# Patient Record
Sex: Male | Born: 1980 | Hispanic: No | Marital: Single | Smoking: Former smoker
Health system: Southern US, Community
[De-identification: ages and names within clinical notes are randomized; demographics above are authoritative.]

## PROBLEM LIST (undated history)

## (undated) DIAGNOSIS — F988 Other specified behavioral and emotional disorders with onset usually occurring in childhood and adolescence: Secondary | ICD-10-CM

## (undated) DIAGNOSIS — L409 Psoriasis, unspecified: Secondary | ICD-10-CM

## (undated) DIAGNOSIS — L4050 Arthropathic psoriasis, unspecified: Secondary | ICD-10-CM

## (undated) DIAGNOSIS — S42309A Unspecified fracture of shaft of humerus, unspecified arm, initial encounter for closed fracture: Secondary | ICD-10-CM

## (undated) HISTORY — DX: Psoriasis, unspecified: L40.9

## (undated) HISTORY — PX: ANTERIOR CRUCIATE LIGAMENT REPAIR: SHX115

## (undated) HISTORY — DX: Other specified behavioral and emotional disorders with onset usually occurring in childhood and adolescence: F98.8

## (undated) HISTORY — DX: Unspecified fracture of shaft of humerus, unspecified arm, initial encounter for closed fracture: S42.309A

## (undated) HISTORY — DX: Arthropathic psoriasis, unspecified: L40.50

## (undated) HISTORY — PX: SHOULDER SURGERY: SHX246

---

## 2003-04-14 ENCOUNTER — Emergency Department (HOSPITAL_COMMUNITY): Admission: EM | Admit: 2003-04-14 | Discharge: 2003-04-14 | Payer: Self-pay | Admitting: Emergency Medicine

## 2003-04-29 ENCOUNTER — Ambulatory Visit (HOSPITAL_COMMUNITY): Admission: RE | Admit: 2003-04-29 | Discharge: 2003-04-30 | Payer: Self-pay | Admitting: Orthopedic Surgery

## 2010-03-18 ENCOUNTER — Emergency Department (HOSPITAL_COMMUNITY): Admission: EM | Admit: 2010-03-18 | Discharge: 2010-03-18 | Payer: Self-pay | Admitting: Emergency Medicine

## 2011-04-01 NOTE — Op Note (Signed)
NAME:  KIEFER, OPHEIM NO.:  [id number]   MEDICAL RECORD NO.:  [id number]                   PATIENT TYPE:  OIB   LOCATION:  5024                                 FACILITY:  [facility]   PHYSICIAN:  Burnard Bunting, M.D.                 DATE OF BIRTH:  1980/11/18   DATE OF PROCEDURE:  04/30/2003  DATE OF DISCHARGE:  04/30/2003                                 OPERATIVE REPORT   PREOPERATIVE DIAGNOSIS:  Right knee anterior cruciate ligament tear.   POSTOPERATIVE DIAGNOSIS:  Right knee anterior cruciate ligament tear.   PROCEDURE:  1. Right knee anterior cruciate ligament reconstruction.  2. Bone-patella-tendon-bone graft.   SURGEON:  Burnard Bunting, M.D.   ASSISTANT:  Jerolyn Shin. Tresa Res, M.D.   ANESTHESIA:  General endotracheal.   ESTIMATED BLOOD LOSS:  25 mL.   DRAINS:  None.   TOURNIQUET TIME:  One hour and 57 minutes at 300 mmHg.   FINDINGS:  1. On examination under anesthesia, the range of motion 0-130 degrees.  The     knee was stable to varus and valgus stress at 0 and 30 degrees of     flexion.  The PCL posterolateral corner intact.  Positive pivot shift and     anterior drawer of 4 to 5 mm.  2. Diagnostic operative arthroscopy revealed intact patellofemoral     component, intact lateral compartment with intact meniscus.  3. A torn ACL, intact PCL.  4. Intact compartment articular cartilage with partial thickness tear of the     lateral meniscus, all posterior to the popliteus, which was stable to     probing.   DESCRIPTION OF PROCEDURE:  The patient was brought to the operating room  where general endotracheal anesthesia was induced.  Preoperative IV  antibiotics were administered.  The right leg was prepped with DuraPrep  solution and draped in a sterile manner.  The leg was elevated and  exsanguinated with the Esmarch wrap.  The tourniquet was inflated.  The PCL  was covered with [place].  An incision was made from the anterior pole of the  patella to the tibial tubercle.  The skin and subcutaneous tissue were  sharply divided.  The para-Tenon's layer was identified and developed as a  separate layer of closure.  A 10 mm harvesting knife was then utilized to  harvest a 10 mm bone-patella-tendon-bone block.  This also was facilitated  with the use of the oscillating saw and four inch osteotomes.  The patella  defect was bone grafted.  The patellar tendon was then reapproximated using  #1 Vicryl suture placed in a running fashion.  The para-Tenon's was then  closed proximal to halfway distal using #2-0 Vicryl.  The proximal medial  tibia at the location of the anticipated tunnel was then cleared of soft  tissue using a periosteal elevator.  At this time the  anterior inferolateral  portal and the anterior femoral portal were created under direct  visualization.  A diagnostic arthroscopy was performed.  The patellofemoral  was intact and loose bodies were in the suprapatellar pouch or the medial  and lateral gutters.  The medial compartment was intact.  No particular  problems with the meniscus.  The posterior compartment was also inspected  and found to be intact.  The ACL was torn but the PCL was intact.  The  lateral compartment was inspected and there was found to be a vertical  lateral meniscal tear at the red-white junction which was partial thickness,  all posterior to the popliteus.  This was stable to probing.  It measured  approximately 1 cm in length.  At this time the ACL stump was debrided;  however, the __________  position was identified.  Notch splicing was  performed.  A tibial guide was then set at 50 degrees and the guide pin was  placed through the posterior aspect of the native ACL __________ .  The  clonal angle was approximately 50-60 degrees.  The 10-mm acorn reamer was  then utilized.  The 2 mm offset guide was then placed through the tunnel at  the junction of the notch and the lateral wall.  The B-pin was  then placed  and the 10 mm tunnel was then drilled to a depth of 25 mm.  The graft was  then passed and secured on the femoral side using an 8 mm x 23 mm  bioabsorble interference screw.  The knee was taken through a range of  motion.  The graft was found not to impinge, and had good isometry.  The  grafts were then tensioned and tied over a post using a 6.5 mm x 30.0 mm  cancellus screw.  Two #5 fiber wire sutures were placed through the tibial  bone plug.  One #5 Ethilon suture was placed through the femoral bone plug.  Following the graft fixation in approximately 10 degrees of flexion, the  knee was again taken through a range of motion and found to have a full  range of motion.  Excellent tension was observed in the graft.  At this  point the knee joint was irrigated.  The tourniquet was released.  The  portals were closed using interrupted #0 Vicryl nylon suture.  The rest of  the para-Tenon's was closed using #2-0 Vicryl suture.  The skin was closed  using an interrupted inverted #3-0 Vicryl suture and running #3-0 flat  Prolene.  The patient was then placed in a bulky knee immobilizer with a  cold pack.  He tolerated the procedure well without any complications.                                                    Burnard Bunting, M.D.    GSD/MEDQ  D:  05/20/2003  T:  05/20/2003  Job:  [id number]

## 2011-04-01 NOTE — Consult Note (Signed)
   NAME:  Ryan Lynch, Ryan Lynch NO.:  [id number]   MEDICAL RECORD NO.:  [id number]                   PATIENT TYPE:  EMS   LOCATION:  MAJO                                 FACILITY:  [facility]   PHYSICIAN:  Burnard Bunting, M.D.                 DATE OF BIRTH:  February 12, 1981   DATE OF CONSULTATION:  DATE OF DISCHARGE:                                   CONSULTATION   CHIEF COMPLAINT:  Right knee pain and instability.   HISTORY OF PRESENT ILLNESS:  The patient is a 30 year old patient who was  running and chasing after someone on Saturday night when he twisted his  knee.  He reports he has had instability and pain since that time.   PAST MEDICAL/ SOCIAL HISTORY:  Unremarkable.  Currently he is on Zoloft.  There are no other medications.   ALLERGIES:  No known drug allergies.   PHYSICAL EXAMINATION:  EXTREMITIES:  The patient has good hip and ankle  range of motion on the right-hand side.  He has a right-knee effusion.  MCL  and ACL were stable.  PCL is intact.  The patient has positive Lachman and  can bend his knee only about 10 degrees, but he does have full extension.   X-RAYS:  Reviewed from the [facility].  They are unremarkable.   IMPRESSION:  Right-knee anterior cruciate ligament tear +/- meniscal tear.   PLAN:  1. Aspirated the knee today.  2. Got about 60 cc of blood out.  3. Injected the knee with Marcaine after which time he was able to bend the     knee to 90 degrees without a mechanical block.  4. I am going to put him in an ACE wrap.  He has a knee immobilizer at home.     He can be weightbearing as tolerated in the knee immobilizer.  5. He has Vicodin for pain.   FOLLOW UP:  He needs to follow up in the clinic with me on Thursday.  We  will go ahead and schedule an MRI scan in the interim.  I will see him back  the following Friday for elective scheduled surgery based on the amount of  symptomatic instability he has had.                              Burnard Bunting, M.D.    GSD/MEDQ  D:  04/14/2003  T:  04/14/2003  Job:  [id number]

## 2015-10-29 ENCOUNTER — Emergency Department (HOSPITAL_COMMUNITY): Payer: BLUE CROSS/BLUE SHIELD

## 2015-10-29 ENCOUNTER — Encounter (HOSPITAL_COMMUNITY): Payer: Self-pay | Admitting: Neurology

## 2015-10-29 ENCOUNTER — Emergency Department (HOSPITAL_COMMUNITY)
Admission: EM | Admit: 2015-10-29 | Discharge: 2015-10-29 | Disposition: A | Discharge status: Home / Self Care | Payer: BLUE CROSS/BLUE SHIELD | Attending: Emergency Medicine | Admitting: Emergency Medicine

## 2015-10-29 DIAGNOSIS — S4991XA Unspecified injury of right shoulder and upper arm, initial encounter: Secondary | ICD-10-CM | POA: Diagnosis present

## 2015-10-29 DIAGNOSIS — Y9389 Activity, other specified: Secondary | ICD-10-CM | POA: Diagnosis not present

## 2015-10-29 DIAGNOSIS — Y9289 Other specified places as the place of occurrence of the external cause: Secondary | ICD-10-CM | POA: Diagnosis not present

## 2015-10-29 DIAGNOSIS — W1789XA Other fall from one level to another, initial encounter: Secondary | ICD-10-CM | POA: Insufficient documentation

## 2015-10-29 DIAGNOSIS — Y998 Other external cause status: Secondary | ICD-10-CM | POA: Diagnosis not present

## 2015-10-29 DIAGNOSIS — S43004A Unspecified dislocation of right shoulder joint, initial encounter: Secondary | ICD-10-CM

## 2015-10-29 DIAGNOSIS — S43014A Anterior dislocation of right humerus, initial encounter: Secondary | ICD-10-CM | POA: Diagnosis not present

## 2015-10-29 MED ORDER — ONDANSETRON HCL 4 MG/2ML IJ SOLN
4.0000 mg | Freq: Once | INTRAMUSCULAR | Status: AC
  Administered 2015-10-29: 4 mg via INTRAVENOUS
  Filled 2015-10-29: qty 2

## 2015-10-29 MED ORDER — HYDROMORPHONE HCL 1 MG/ML IJ SOLN
1.0000 mg | Freq: Once | INTRAMUSCULAR | Status: AC
  Administered 2015-10-29: 1 mg via INTRAVENOUS
  Filled 2015-10-29: qty 1

## 2015-10-29 MED ORDER — KETAMINE HCL 10 MG/ML IJ SOLN
40.0000 mg | Freq: Once | INTRAMUSCULAR | Status: AC
  Administered 2015-10-29: 40 mg via INTRAVENOUS
  Filled 2015-10-29: qty 4

## 2015-10-29 MED ORDER — PROPOFOL 10 MG/ML IV BOLUS
40.0000 mg | Freq: Once | INTRAVENOUS | Status: AC
  Administered 2015-10-29: 40 mg via INTRAVENOUS
  Filled 2015-10-29: qty 20

## 2015-10-29 MED ORDER — HYDROCODONE-ACETAMINOPHEN 5-325 MG PO TABS: 2.0000 | ORAL_TABLET | ORAL | Status: DC | PRN

## 2015-10-29 MED ORDER — SODIUM CHLORIDE 0.9 % IV BOLUS (SEPSIS)
1000.0000 mL | Freq: Once | INTRAVENOUS | Status: AC
  Administered 2015-10-29: 1000 mL via INTRAVENOUS

## 2015-10-29 MED ORDER — PROPOFOL 10 MG/ML IV BOLUS
INTRAVENOUS | Status: AC | PRN
  Administered 2015-10-29: 30 mg via INTRAVENOUS

## 2015-10-29 MED ORDER — KETAMINE HCL 10 MG/ML IJ SOLN
1.0000 mg/kg | Freq: Once | INTRAMUSCULAR | Status: DC
  Filled 2015-10-29: qty 7.5

## 2015-10-29 NOTE — Sedation Documentation (Signed)
Successful reduction 

## 2015-10-29 NOTE — Discharge Instructions (Signed)
Take your medications as prescribed. Continue using sling immobilizer for the next week. Follow up with Orthopedics in 1 week. Please return to the Emergency Department if symptoms worsen or new onset of numbness, tingling, weakness, deformity to right shoulder.

## 2015-10-29 NOTE — Sedation Documentation (Signed)
30mg propofol given

## 2015-10-29 NOTE — Sedation Documentation (Signed)
10 mg propofol given

## 2015-10-29 NOTE — Sedation Documentation (Signed)
20mg propofol given 

## 2015-10-29 NOTE — ED Provider Notes (Signed)
Patient screen in triage. Fall down ravine.  C/o right shoulder pain. Hx prior dislocation. Denies other injury. Deformity to shoulder noted.  c spine non tender. Radial pulse 2+.  Xrays. Dilaudid iv. zofran iv. Move to room in back for definitive tx.      Ryan LaineKevin Ulys Favia, MD 10/29/15 [phone]

## 2015-10-29 NOTE — ED Provider Notes (Signed)
See original H&P for at the station. Briefly, the patient states that he fell down a ravine outside and must have caught his arm up and thinks he dislocated his right shoulder. He complains of severe right shoulder pain. He has dislocated this shoulder before. On exam, he is neurovascularly intact in his right hand with an obvious deformity of his right shoulder which is squared off, concerning for dislocation. Plain film shows anterior, inferior dislocation. Discussed risks and benefits of sedation with the patient and he elected to have reduction under sedation.  Procedural sedation Performed by: Ambrose Finlandachel Morgan Little Consent: Verbal consent obtained. Risks and benefits: risks, benefits and alternatives were discussed Required items: required blood products, implants, devices, and special equipment available Patient identity confirmed: arm band and provided demographic data Time out: Immediately prior to procedure a "time out" was called to verify the correct patient, procedure, equipment, support staff and site/side marked as required.  Sedation type: moderate (conscious) sedation NPO time confirmed and considedered  Sedatives: KETAMINE   Physician Time at Bedside: 15:40  Vitals: Vital signs were monitored during sedation. Cardiac Monitor, pulse oximeter Patient tolerance: Patient tolerated the procedure well with no immediate complications. Comments: Pt with uneventful recovered. Returned to pre-procedural sedation baseline  Reduction of dislocation Date/Time: 10:55 AM Performed by: Ambrose Finlandachel Morgan Little Authorized by: Ambrose Finlandachel Morgan Little Consent: Verbal consent obtained. Risks and benefits: risks, benefits and alternatives were discussed Consent given by: patient Required items: required blood products, implants, devices, and special equipment available Time out: Immediately prior to procedure a "time out" was called to verify the correct patient, procedure, equipment, support staff  and site/side marked as required.  Patient sedated: Ketamine and propofol  Vitals: Vital signs were monitored during sedation. Patient tolerance: Patient tolerated the procedure well with no immediate complications. Joint: R shoulder anterior dislocation Reduction technique: external rotation and abduction Joint successfully reduced and patient was neurovascularly intact distally before and after procedure.    Obtained plain films of shoulder after reduction to ensure appropriate location. Placed patient in sling and instructed to follow-up with orthopedics. Return precautions reviewed. Patient voiced understanding and was discharged in satisfactory condition.     Laurence Spatesachel Morgan Little, MD 10/30/15 [phone]

## 2015-10-29 NOTE — ED Notes (Addendum)
Pt reports slipped while walking and rolled down a ravine about 15 ft, c/o severe right shoulder pain. Sensation intact. Pt in a lot of pain. Pt reports a brief period of disorientation after the fall. No open injury. Pt holding right shoulder tightly.

## 2015-10-29 NOTE — ED Provider Notes (Signed)
CSN: [id number]     Arrival date & time 10/29/15  1358 History   First MD Initiated Contact with Patient 10/29/15 1410     Chief Complaint  Patient presents with  . Shoulder Injury     (Consider location/radiation/quality/duration/timing/severity/associated sxs/prior Treatment) HPI   Patient is a 34 year old male with past medical history of psoriasis who presents to the ED with complaint of right shoulder pain, onset PTA. Patient reports he tripped and fell down a ravine proximately 15 feet. He notes he tumbled down and thinks his arm hit a few tree branches. Denies head injury or LOC. Patient endorses having constant aching pain to his right shoulder, bicep and tricep that becomes a sharp pain with movement of his right arm. Denies numbness, tingling. Patient reports having a history of previously dislocating his right shoulder in the past.  History reviewed. No pertinent past medical history. History reviewed. No pertinent past surgical history. No family history on file. Social History  Substance Use Topics  . Smoking status: Never Smoker   . Smokeless tobacco: None  . Alcohol Use: Yes    Review of Systems  Musculoskeletal: Positive for arthralgias.  All other systems reviewed and are negative.     Allergies  Review of patient's allergies indicates no known allergies.  Home Medications   Prior to Admission medications   Medication Sig Start Date End Date Taking? Authorizing Provider  HYDROcodone-acetaminophen (NORCO/VICODIN) 5-325 MG tablet Take 2 tablets by mouth every 4 (four) hours as needed. 10/29/15   Satira Sark Janaiya Beauchesne, PA-C   BP 141/81 mmHg  Pulse 100  Temp(Src) 98.1 F (36.7 C) (Oral)  Resp 13  Ht  (1.778 m)  Wt 74.844 kg  BMI 23.68 kg/m2  SpO2 97% Physical Exam  Constitutional: He is oriented to person, place, and time. He appears well-developed and well-nourished.  HENT:  Head: Normocephalic and atraumatic.  Eyes: Conjunctivae and EOM are  normal. Right eye exhibits no discharge. Left eye exhibits no discharge. No scleral icterus.  Neck: Normal range of motion. Neck supple.  Cardiovascular: Normal rate, regular rhythm, normal heart sounds and intact distal pulses.   Pulmonary/Chest: Effort normal and breath sounds normal.  Abdominal: Soft. Bowel sounds are normal. He exhibits no distension. There is no tenderness.  Musculoskeletal: He exhibits no edema.  Obvious deformity noted to right shoulder, TTP along right anterior, posterior, and lateral shoulder. Dec. ROM of right shoulder and elbow due to pain. FROM of right wrist and hand. 2+ radial pulses. Cap refill <2. Sensation intact.   Neurological: He is alert and oriented to person, place, and time.  Skin: Skin is warm and dry.  Nursing note and vitals reviewed.   ED Course  Procedures (including critical care time) Labs Review Labs Reviewed - No data to display  Imaging Review Dg Shoulder Right  10/29/2015  CLINICAL DATA:  Postreduction for anterior dislocation EXAM: RIGHT SHOULDER - 2+ VIEW COMPARISON:  Study obtained earlier in the day FINDINGS: Frontal and Y scapular images were obtained. The previously noted anterior dislocation has been reduced successfully. No fracture apparent. Joint spaces appear intact. No erosive change. IMPRESSION: Currently no demonstrable fracture or dislocation. No appreciable arthropathy Electronically Signed   By: Bretta Bang III M.D.   On: 10/29/2015 16:19   Dg Shoulder Right  10/29/2015  CLINICAL DATA:  Post fall 15 feet down ravine now with right shoulder pain. History of multiple prior shoulder dislocations. EXAM: RIGHT SHOULDER - 2+ VIEW COMPARISON:  03/18/2010 FINDINGS:  There is a dislocation of the humeral head anterior and inferior to the glenoid. Acromioclavicular joint spaces appear preserved. Suspected glenohumeral joint effusion. No definite associated fracture. Limited visualization adjacent thorax is normal. IMPRESSION:  Anterior inferior dislocation of the right glenohumeral joint without definitive associated fracture. Electronically Signed   By: Simonne ComeJohn  Watts M.D.   On: 10/29/2015 15:05   I have personally reviewed and evaluated these images and lab results as part of my medical decision-making.  Filed Vitals:   10/29/15 1552 10/29/15 1624  BP: 146/95 141/81  Pulse: 88 100  Temp:    Resp: 22 13     MDM   Final diagnoses:  Shoulder dislocation, right, initial encounter    Patient presented with right shoulder pain after falling down a ravine earlier today. Denies head injury or LOC. Denies numbness, tingling. History of right shoulder dislocation. VSS. Exam revealed deformity to right shoulder, 2+ radial pulses, right arm otherwise neurovascularly intact. X-ray revealed anterior inferior dislocation of right glenohumeral joint without definitive fracture. Reduction procedure performed by myself and Dr. Clarene DukeLittle, reduction was successful without any complications, 2+ radial pulses s/p reduction. Sling placed on pt and postreduction xray ordered. Xray showed no fx or dislocation. Plan to d/c pt home with ortho follow up in 1 week. Advised pt to continue using sling immobilizer with limited ROM of right shoulder until following up with ortho.  Evaluation does not show pathology requring ongoing emergent intervention or admission. Pt is hemodynamically stable and mentating appropriately. Discussed findings/results and plan with patient/guardian, who agrees with plan. All questions answered. Return precautions discussed and outpatient follow up given.        Satira Sarkicole Elizabeth LeonardNadeau, [place] JerseyPA-C 10/29/15 1632  Laurence Spatesachel Morgan Little, MD 10/30/15 1057

## 2015-10-29 NOTE — Progress Notes (Signed)
Orthopedic Tech Progress Note Patient Details:  Ryan BusingJames Lynch 08-Jan-1981 [id number]  Ortho Devices Type of Ortho Device: Shoulder immobilizer Ortho Device/Splint Location: RUE Ortho Device/Splint Interventions: Ordered, Application   Jennye MoccasinHughes, Lashawnda Hancox Craig 10/29/2015, 4:00 PM

## 2015-10-29 NOTE — ED Notes (Signed)
Pt states he is unable to stand on the scale

## 2017-01-27 IMAGING — CR DG SHOULDER 2+V*R*
2 series · 2 of 2 positions shown · non-contrast
Comparison: 03/18/2010

CLINICAL DATA: Post fall 15 feet down ravine now with right
shoulder pain. History of multiple prior shoulder dislocations.

EXAM:
RIGHT SHOULDER - 2+ VIEW

[shoulder grashey]
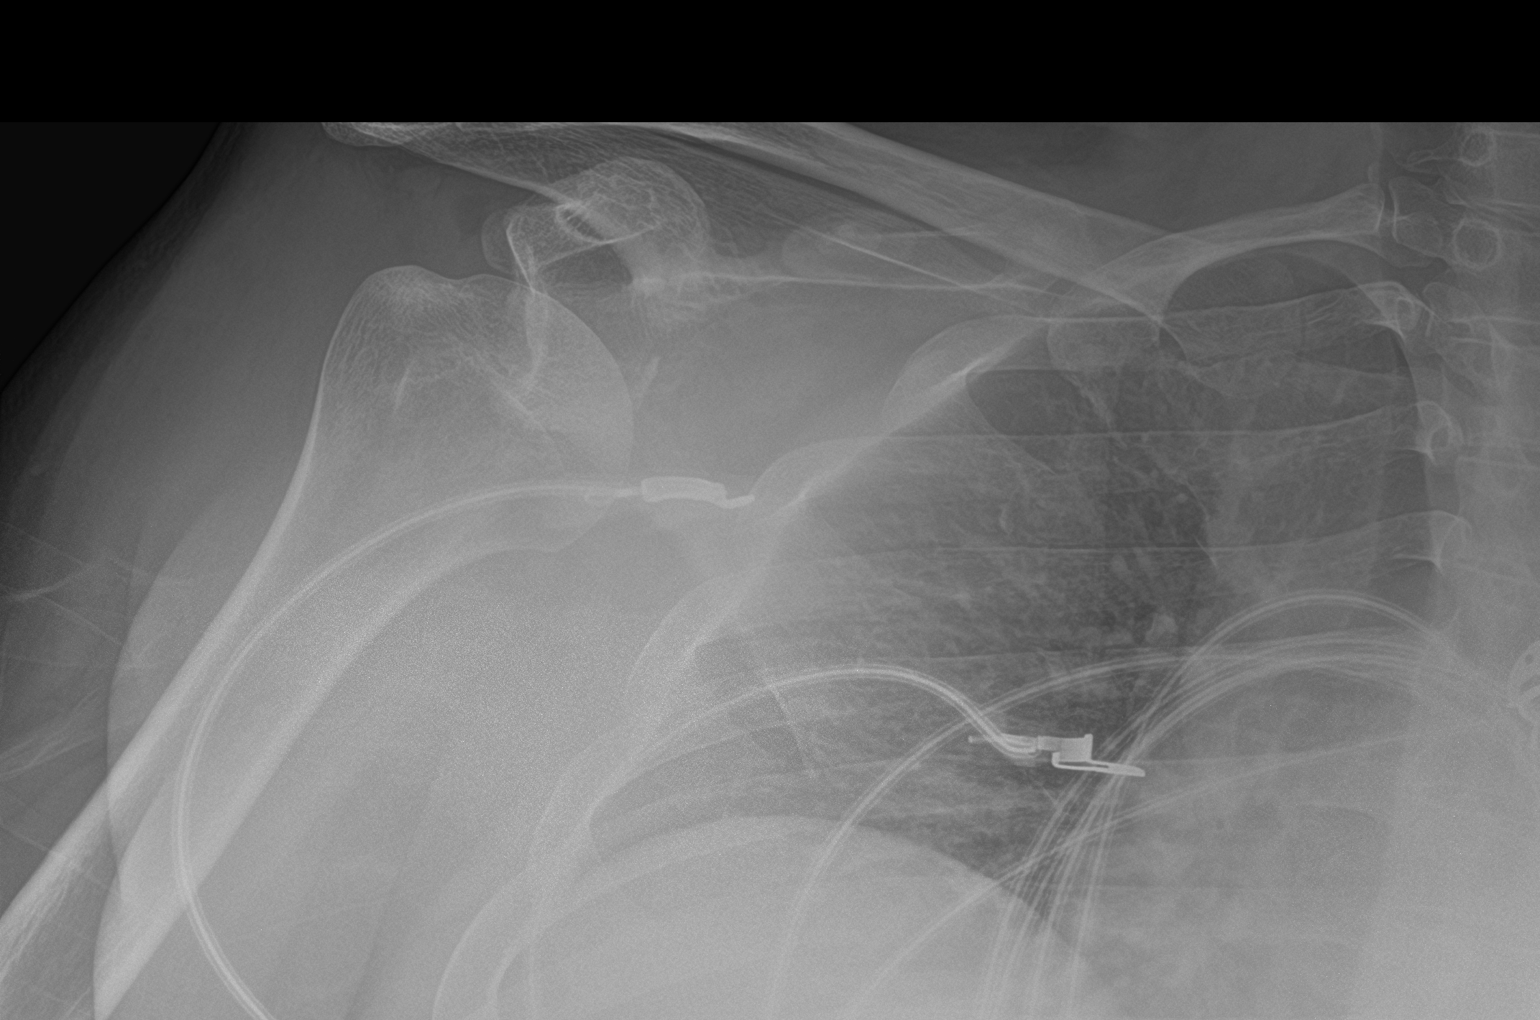

[shoulder y view]
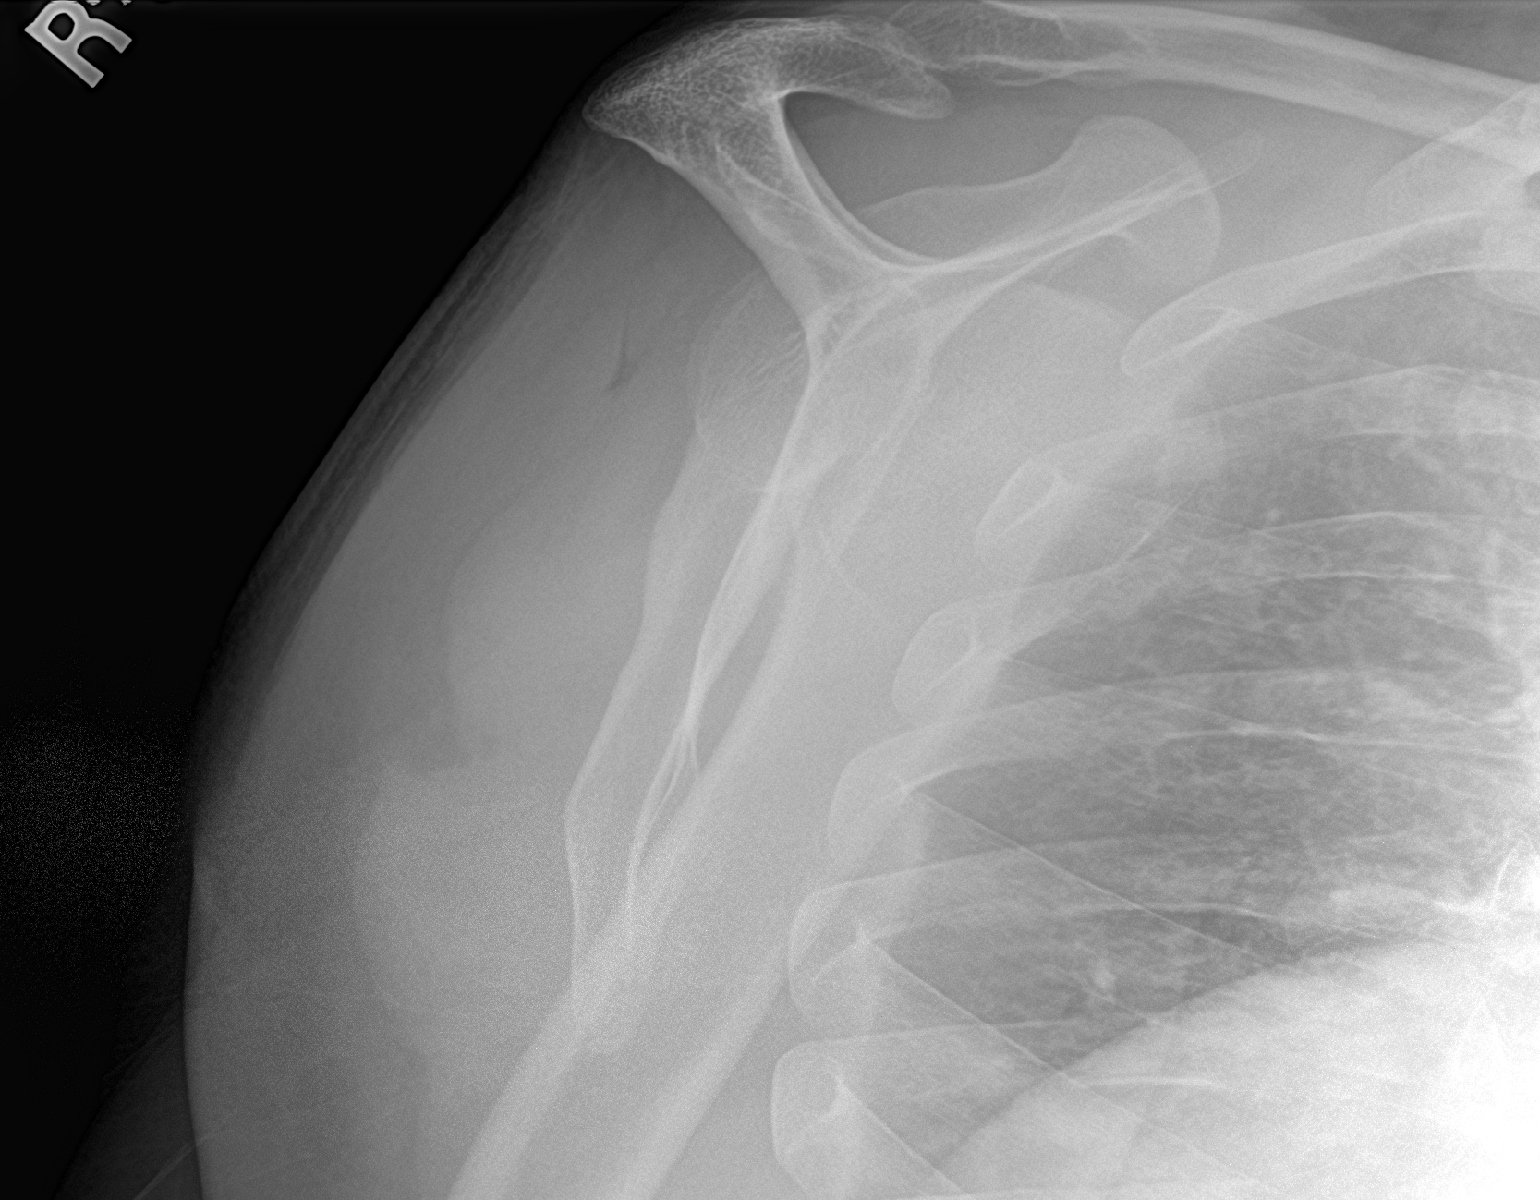

[2 of 2 positions shown; findings below may reference images not displayed]

FINDINGS: There is a dislocation of the humeral head anterior and inferior to
the glenoid. Acromioclavicular joint spaces appear preserved.
Suspected glenohumeral joint effusion. No definite associated
fracture. Limited visualization adjacent thorax is normal.
IMPRESSION: Anterior inferior dislocation of the right glenohumeral joint
without definitive associated fracture.

## 2017-01-27 IMAGING — CR DG SHOULDER 2+V*R*
2 series · 2 of 2 positions shown · non-contrast
Comparison: Study obtained earlier in the day

CLINICAL DATA: Postreduction for anterior dislocation

EXAM:
RIGHT SHOULDER - 2+ VIEW

[shoulder grashey]
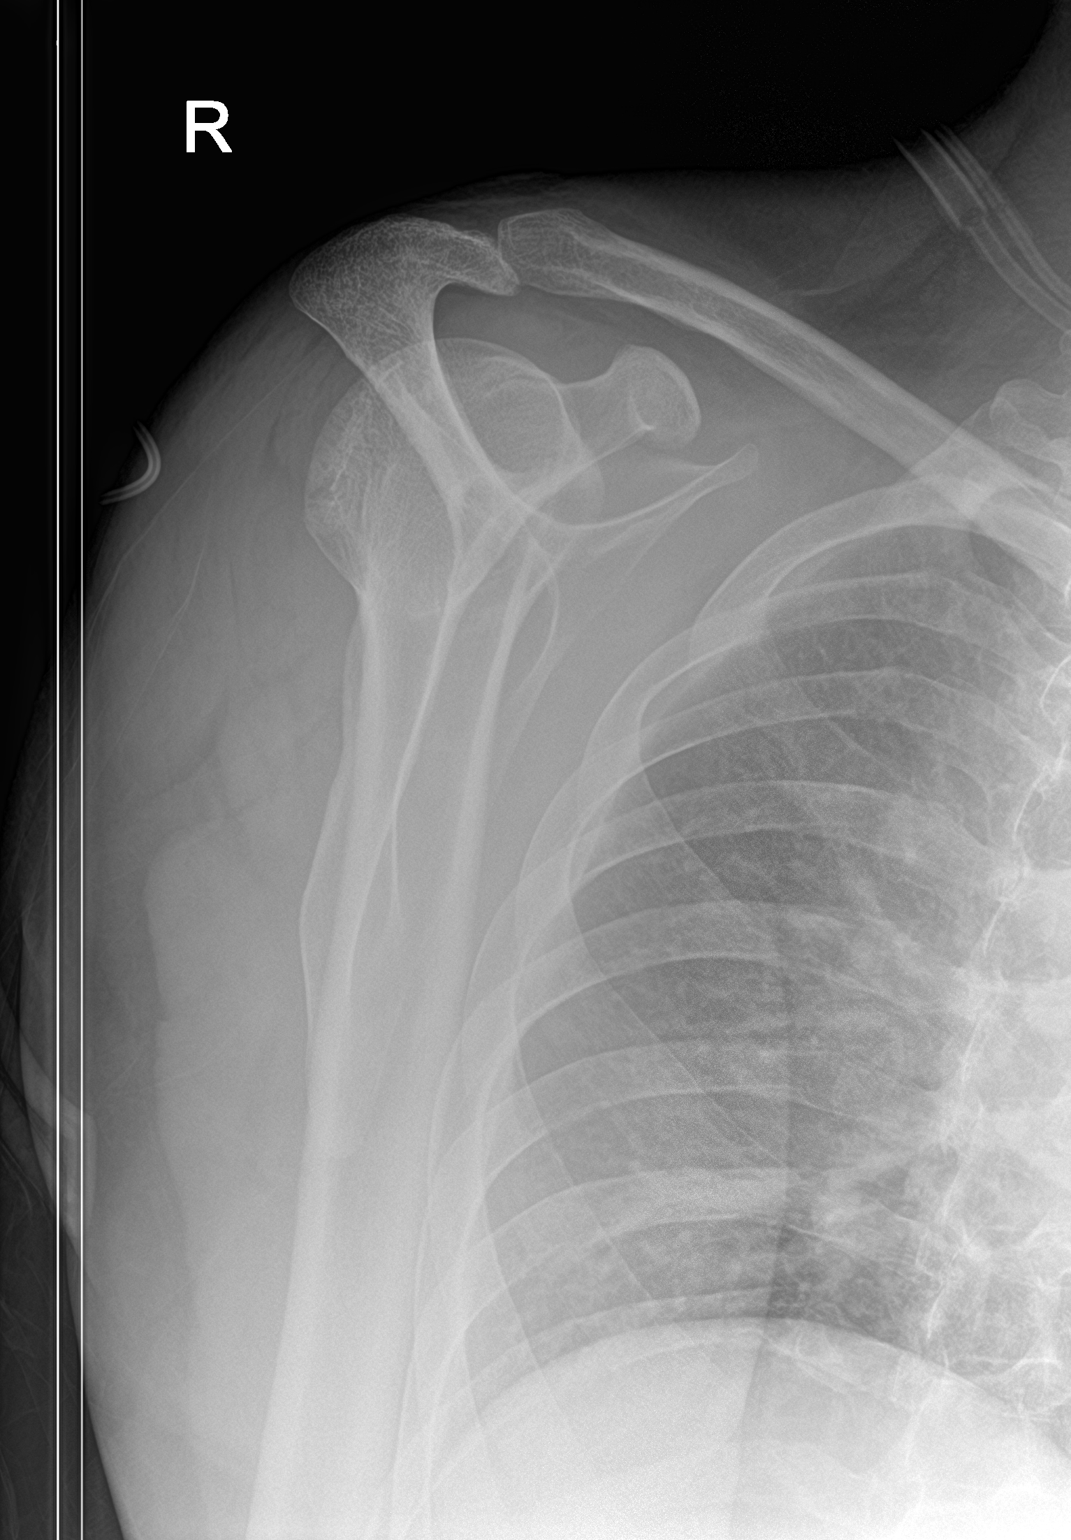

[shoulder y view]
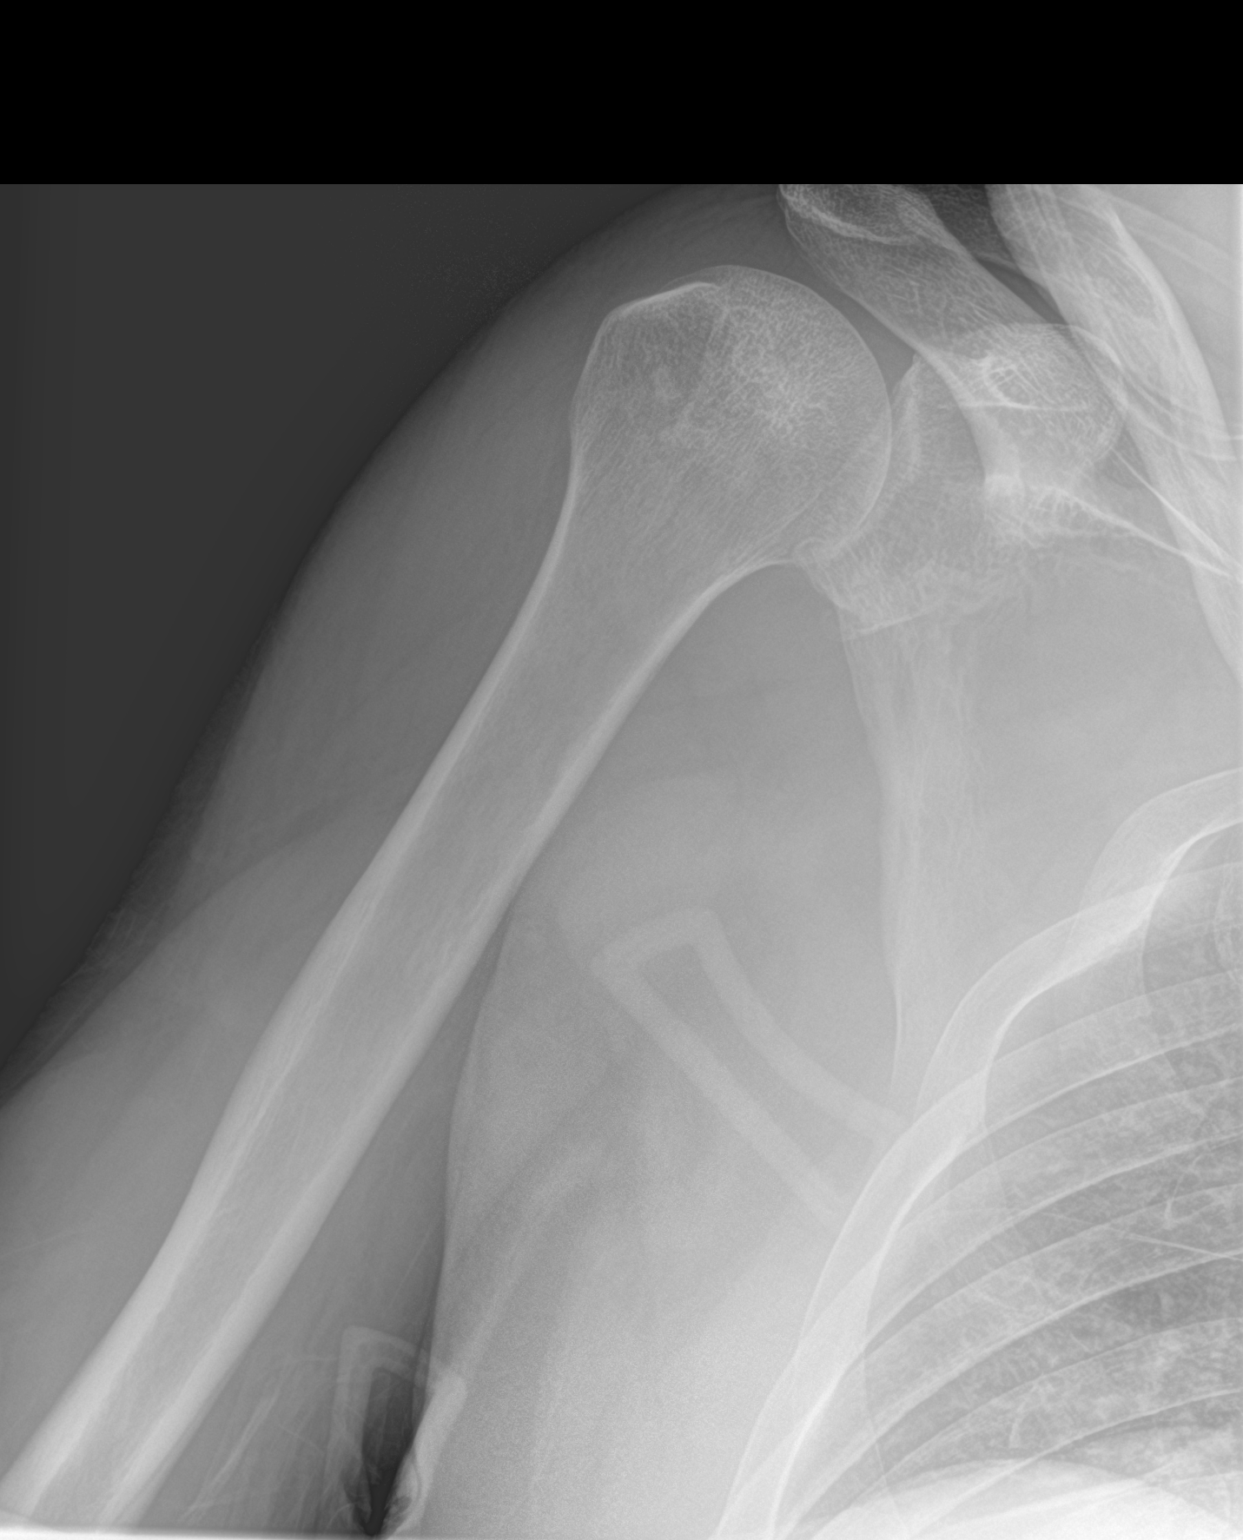

[2 of 2 positions shown; findings below may reference images not displayed]

FINDINGS: Frontal and Y scapular images were obtained. The previously noted
anterior dislocation has been reduced successfully. No fracture
apparent. Joint spaces appear intact. No erosive change.
IMPRESSION: Currently no demonstrable fracture or dislocation. No appreciable
arthropathy

## 2021-05-27 DIAGNOSIS — F4010 Social phobia, unspecified: Secondary | ICD-10-CM | POA: Diagnosis not present

## 2021-11-22 DIAGNOSIS — F410 Panic disorder [episodic paroxysmal anxiety] without agoraphobia: Secondary | ICD-10-CM | POA: Diagnosis not present

## 2021-11-22 DIAGNOSIS — F4010 Social phobia, unspecified: Secondary | ICD-10-CM | POA: Diagnosis not present

## 2022-05-12 DIAGNOSIS — F410 Panic disorder [episodic paroxysmal anxiety] without agoraphobia: Secondary | ICD-10-CM | POA: Diagnosis not present

## 2022-05-12 DIAGNOSIS — F4010 Social phobia, unspecified: Secondary | ICD-10-CM | POA: Diagnosis not present

## 2022-06-14 DIAGNOSIS — N179 Acute kidney failure, unspecified: Secondary | ICD-10-CM

## 2022-06-14 DIAGNOSIS — T796XXA Traumatic ischemia of muscle, initial encounter: Secondary | ICD-10-CM

## 2022-06-14 DIAGNOSIS — D649 Anemia, unspecified: Secondary | ICD-10-CM

## 2022-06-14 DIAGNOSIS — R338 Other retention of urine: Secondary | ICD-10-CM

## 2022-06-14 DIAGNOSIS — E279 Disorder of adrenal gland, unspecified: Secondary | ICD-10-CM

## 2022-06-14 DIAGNOSIS — D696 Thrombocytopenia, unspecified: Secondary | ICD-10-CM

## 2022-06-14 DIAGNOSIS — S2249XA Multiple fractures of ribs, unspecified side, initial encounter for closed fracture: Secondary | ICD-10-CM

## 2022-06-14 DIAGNOSIS — G9341 Metabolic encephalopathy: Secondary | ICD-10-CM

## 2022-06-14 HISTORY — DX: Other retention of urine: R33.8

## 2022-06-14 HISTORY — DX: Thrombocytopenia, unspecified: D69.6

## 2022-06-14 HISTORY — DX: Anemia, unspecified: D64.9

## 2022-06-14 HISTORY — DX: Traumatic ischemia of muscle, initial encounter: T79.6XXA

## 2022-06-14 HISTORY — DX: Disorder of adrenal gland, unspecified: E27.9

## 2022-06-14 HISTORY — DX: Acute kidney failure, unspecified: N17.9

## 2022-06-14 HISTORY — DX: Multiple fractures of ribs, unspecified side, initial encounter for closed fracture: S22.49XA

## 2022-06-14 HISTORY — DX: Metabolic encephalopathy: G93.41

## 2022-07-02 ENCOUNTER — Emergency Department (HOSPITAL_COMMUNITY): Payer: BC Managed Care – PPO

## 2022-07-02 ENCOUNTER — Encounter (HOSPITAL_COMMUNITY)
Admission: EM | Disposition: A | Discharge status: Rehabilitation Facility | Payer: Self-pay | Source: Home / Self Care | Attending: Orthopedic Surgery

## 2022-07-02 ENCOUNTER — Encounter (HOSPITAL_COMMUNITY): Payer: Self-pay

## 2022-07-02 ENCOUNTER — Inpatient Hospital Stay (HOSPITAL_COMMUNITY): Payer: BC Managed Care – PPO

## 2022-07-02 ENCOUNTER — Emergency Department (HOSPITAL_COMMUNITY): Payer: BC Managed Care – PPO | Admitting: Certified Registered Nurse Anesthetist

## 2022-07-02 ENCOUNTER — Inpatient Hospital Stay (HOSPITAL_COMMUNITY)
Admission: EM | Admit: 2022-07-02 | Discharge: 2022-07-16 | DRG: 957 | Disposition: A | Discharge status: Rehabilitation Facility | Payer: BC Managed Care – PPO | Attending: Family Medicine | Admitting: Family Medicine

## 2022-07-02 ENCOUNTER — Other Ambulatory Visit: Payer: Self-pay

## 2022-07-02 DIAGNOSIS — K567 Ileus, unspecified: Secondary | ICD-10-CM | POA: Diagnosis not present

## 2022-07-02 DIAGNOSIS — R9431 Abnormal electrocardiogram [ECG] [EKG]: Secondary | ICD-10-CM | POA: Diagnosis not present

## 2022-07-02 DIAGNOSIS — D509 Iron deficiency anemia, unspecified: Secondary | ICD-10-CM | POA: Diagnosis not present

## 2022-07-02 DIAGNOSIS — R161 Splenomegaly, not elsewhere classified: Secondary | ICD-10-CM | POA: Diagnosis present

## 2022-07-02 DIAGNOSIS — M7989 Other specified soft tissue disorders: Secondary | ICD-10-CM | POA: Diagnosis not present

## 2022-07-02 DIAGNOSIS — F13239 Sedative, hypnotic or anxiolytic dependence with withdrawal, unspecified: Secondary | ICD-10-CM | POA: Diagnosis not present

## 2022-07-02 DIAGNOSIS — T796XXA Traumatic ischemia of muscle, initial encounter: Secondary | ICD-10-CM | POA: Diagnosis not present

## 2022-07-02 DIAGNOSIS — F10231 Alcohol dependence with withdrawal delirium: Secondary | ICD-10-CM | POA: Diagnosis not present

## 2022-07-02 DIAGNOSIS — Z4682 Encounter for fitting and adjustment of non-vascular catheter: Secondary | ICD-10-CM | POA: Diagnosis not present

## 2022-07-02 DIAGNOSIS — G9341 Metabolic encephalopathy: Secondary | ICD-10-CM | POA: Diagnosis not present

## 2022-07-02 DIAGNOSIS — Z789 Other specified health status: Secondary | ICD-10-CM

## 2022-07-02 DIAGNOSIS — D696 Thrombocytopenia, unspecified: Secondary | ICD-10-CM | POA: Diagnosis not present

## 2022-07-02 DIAGNOSIS — F419 Anxiety disorder, unspecified: Secondary | ICD-10-CM | POA: Diagnosis present

## 2022-07-02 DIAGNOSIS — K5939 Other megacolon: Secondary | ICD-10-CM | POA: Diagnosis not present

## 2022-07-02 DIAGNOSIS — R066 Hiccough: Secondary | ICD-10-CM | POA: Diagnosis not present

## 2022-07-02 DIAGNOSIS — J9811 Atelectasis: Secondary | ICD-10-CM | POA: Diagnosis not present

## 2022-07-02 DIAGNOSIS — F10239 Alcohol dependence with withdrawal, unspecified: Secondary | ICD-10-CM | POA: Diagnosis not present

## 2022-07-02 DIAGNOSIS — L409 Psoriasis, unspecified: Secondary | ICD-10-CM | POA: Diagnosis present

## 2022-07-02 DIAGNOSIS — A419 Sepsis, unspecified organism: Secondary | ICD-10-CM | POA: Diagnosis not present

## 2022-07-02 DIAGNOSIS — E278 Other specified disorders of adrenal gland: Secondary | ICD-10-CM | POA: Diagnosis present

## 2022-07-02 DIAGNOSIS — F988 Other specified behavioral and emotional disorders with onset usually occurring in childhood and adolescence: Secondary | ICD-10-CM | POA: Diagnosis not present

## 2022-07-02 DIAGNOSIS — F0781 Postconcussional syndrome: Secondary | ICD-10-CM | POA: Diagnosis not present

## 2022-07-02 DIAGNOSIS — R509 Fever, unspecified: Secondary | ICD-10-CM | POA: Diagnosis not present

## 2022-07-02 DIAGNOSIS — S82102A Unspecified fracture of upper end of left tibia, initial encounter for closed fracture: Secondary | ICD-10-CM | POA: Diagnosis not present

## 2022-07-02 DIAGNOSIS — N200 Calculus of kidney: Secondary | ICD-10-CM | POA: Diagnosis not present

## 2022-07-02 DIAGNOSIS — F339 Major depressive disorder, recurrent, unspecified: Secondary | ICD-10-CM | POA: Diagnosis not present

## 2022-07-02 DIAGNOSIS — K7460 Unspecified cirrhosis of liver: Secondary | ICD-10-CM | POA: Diagnosis present

## 2022-07-02 DIAGNOSIS — S22060D Wedge compression fracture of T7-T8 vertebra, subsequent encounter for fracture with routine healing: Secondary | ICD-10-CM | POA: Diagnosis not present

## 2022-07-02 DIAGNOSIS — S82142A Displaced bicondylar fracture of left tibia, initial encounter for closed fracture: Secondary | ICD-10-CM

## 2022-07-02 DIAGNOSIS — S22060A Wedge compression fracture of T7-T8 vertebra, initial encounter for closed fracture: Secondary | ICD-10-CM

## 2022-07-02 DIAGNOSIS — R52 Pain, unspecified: Secondary | ICD-10-CM | POA: Diagnosis not present

## 2022-07-02 DIAGNOSIS — Z79899 Other long term (current) drug therapy: Secondary | ICD-10-CM

## 2022-07-02 DIAGNOSIS — F05 Delirium due to known physiological condition: Secondary | ICD-10-CM | POA: Diagnosis not present

## 2022-07-02 DIAGNOSIS — K3189 Other diseases of stomach and duodenum: Secondary | ICD-10-CM | POA: Diagnosis not present

## 2022-07-02 DIAGNOSIS — S22000A Wedge compression fracture of unspecified thoracic vertebra, initial encounter for closed fracture: Secondary | ICD-10-CM

## 2022-07-02 DIAGNOSIS — Z043 Encounter for examination and observation following other accident: Secondary | ICD-10-CM | POA: Diagnosis not present

## 2022-07-02 DIAGNOSIS — Z20822 Contact with and (suspected) exposure to covid-19: Secondary | ICD-10-CM | POA: Diagnosis present

## 2022-07-02 DIAGNOSIS — K760 Fatty (change of) liver, not elsewhere classified: Secondary | ICD-10-CM | POA: Diagnosis present

## 2022-07-02 DIAGNOSIS — N179 Acute kidney failure, unspecified: Secondary | ICD-10-CM | POA: Diagnosis not present

## 2022-07-02 DIAGNOSIS — D649 Anemia, unspecified: Secondary | ICD-10-CM

## 2022-07-02 DIAGNOSIS — S83282A Other tear of lateral meniscus, current injury, left knee, initial encounter: Secondary | ICD-10-CM | POA: Diagnosis not present

## 2022-07-02 DIAGNOSIS — R140 Abdominal distension (gaseous): Secondary | ICD-10-CM

## 2022-07-02 DIAGNOSIS — W19XXXA Unspecified fall, initial encounter: Principal | ICD-10-CM

## 2022-07-02 DIAGNOSIS — R079 Chest pain, unspecified: Secondary | ICD-10-CM | POA: Diagnosis not present

## 2022-07-02 DIAGNOSIS — G709 Myoneural disorder, unspecified: Secondary | ICD-10-CM | POA: Diagnosis not present

## 2022-07-02 DIAGNOSIS — W109XXA Fall (on) (from) unspecified stairs and steps, initial encounter: Secondary | ICD-10-CM | POA: Diagnosis present

## 2022-07-02 DIAGNOSIS — Y92009 Unspecified place in unspecified non-institutional (private) residence as the place of occurrence of the external cause: Secondary | ICD-10-CM | POA: Diagnosis not present

## 2022-07-02 DIAGNOSIS — R600 Localized edema: Secondary | ICD-10-CM | POA: Diagnosis not present

## 2022-07-02 DIAGNOSIS — R338 Other retention of urine: Secondary | ICD-10-CM

## 2022-07-02 DIAGNOSIS — R339 Retention of urine, unspecified: Secondary | ICD-10-CM | POA: Diagnosis present

## 2022-07-02 DIAGNOSIS — D62 Acute posthemorrhagic anemia: Secondary | ICD-10-CM | POA: Diagnosis not present

## 2022-07-02 DIAGNOSIS — F411 Generalized anxiety disorder: Secondary | ICD-10-CM | POA: Diagnosis not present

## 2022-07-02 DIAGNOSIS — S2241XD Multiple fractures of ribs, right side, subsequent encounter for fracture with routine healing: Secondary | ICD-10-CM | POA: Diagnosis not present

## 2022-07-02 DIAGNOSIS — S3991XA Unspecified injury of abdomen, initial encounter: Secondary | ICD-10-CM | POA: Diagnosis not present

## 2022-07-02 DIAGNOSIS — R0600 Dyspnea, unspecified: Secondary | ICD-10-CM | POA: Diagnosis not present

## 2022-07-02 DIAGNOSIS — R0602 Shortness of breath: Secondary | ICD-10-CM | POA: Diagnosis not present

## 2022-07-02 DIAGNOSIS — R1915 Other abnormal bowel sounds: Secondary | ICD-10-CM | POA: Diagnosis not present

## 2022-07-02 DIAGNOSIS — Q891 Congenital malformations of adrenal gland: Secondary | ICD-10-CM

## 2022-07-02 DIAGNOSIS — K828 Other specified diseases of gallbladder: Secondary | ICD-10-CM | POA: Diagnosis not present

## 2022-07-02 DIAGNOSIS — F909 Attention-deficit hyperactivity disorder, unspecified type: Secondary | ICD-10-CM | POA: Diagnosis present

## 2022-07-02 DIAGNOSIS — S0003XA Contusion of scalp, initial encounter: Secondary | ICD-10-CM | POA: Diagnosis not present

## 2022-07-02 DIAGNOSIS — I10 Essential (primary) hypertension: Secondary | ICD-10-CM | POA: Diagnosis not present

## 2022-07-02 DIAGNOSIS — Z781 Physical restraint status: Secondary | ICD-10-CM

## 2022-07-02 DIAGNOSIS — S82112A Displaced fracture of left tibial spine, initial encounter for closed fracture: Secondary | ICD-10-CM | POA: Diagnosis not present

## 2022-07-02 DIAGNOSIS — T796XXD Traumatic ischemia of muscle, subsequent encounter: Secondary | ICD-10-CM | POA: Diagnosis not present

## 2022-07-02 DIAGNOSIS — S82252A Displaced comminuted fracture of shaft of left tibia, initial encounter for closed fracture: Secondary | ICD-10-CM | POA: Diagnosis not present

## 2022-07-02 DIAGNOSIS — S3993XA Unspecified injury of pelvis, initial encounter: Secondary | ICD-10-CM | POA: Diagnosis not present

## 2022-07-02 DIAGNOSIS — Z8619 Personal history of other infectious and parasitic diseases: Secondary | ICD-10-CM | POA: Diagnosis not present

## 2022-07-02 DIAGNOSIS — T79A22A Traumatic compartment syndrome of left lower extremity, initial encounter: Secondary | ICD-10-CM | POA: Diagnosis not present

## 2022-07-02 DIAGNOSIS — S82142D Displaced bicondylar fracture of left tibia, subsequent encounter for closed fracture with routine healing: Secondary | ICD-10-CM | POA: Diagnosis not present

## 2022-07-02 DIAGNOSIS — Z452 Encounter for adjustment and management of vascular access device: Secondary | ICD-10-CM | POA: Diagnosis not present

## 2022-07-02 DIAGNOSIS — K6389 Other specified diseases of intestine: Secondary | ICD-10-CM | POA: Diagnosis not present

## 2022-07-02 DIAGNOSIS — S0003XD Contusion of scalp, subsequent encounter: Secondary | ICD-10-CM | POA: Diagnosis not present

## 2022-07-02 DIAGNOSIS — K56609 Unspecified intestinal obstruction, unspecified as to partial versus complete obstruction: Secondary | ICD-10-CM | POA: Diagnosis not present

## 2022-07-02 DIAGNOSIS — K8020 Calculus of gallbladder without cholecystitis without obstruction: Secondary | ICD-10-CM | POA: Diagnosis not present

## 2022-07-02 DIAGNOSIS — S2241XA Multiple fractures of ribs, right side, initial encounter for closed fracture: Secondary | ICD-10-CM | POA: Diagnosis not present

## 2022-07-02 DIAGNOSIS — K7682 Hepatic encephalopathy: Secondary | ICD-10-CM

## 2022-07-02 DIAGNOSIS — R109 Unspecified abdominal pain: Secondary | ICD-10-CM | POA: Diagnosis not present

## 2022-07-02 DIAGNOSIS — R7401 Elevation of levels of liver transaminase levels: Secondary | ICD-10-CM | POA: Diagnosis not present

## 2022-07-02 DIAGNOSIS — Z4889 Encounter for other specified surgical aftercare: Secondary | ICD-10-CM | POA: Diagnosis not present

## 2022-07-02 DIAGNOSIS — J90 Pleural effusion, not elsewhere classified: Secondary | ICD-10-CM | POA: Diagnosis not present

## 2022-07-02 DIAGNOSIS — E559 Vitamin D deficiency, unspecified: Secondary | ICD-10-CM | POA: Diagnosis present

## 2022-07-02 DIAGNOSIS — M5020 Other cervical disc displacement, unspecified cervical region: Secondary | ICD-10-CM | POA: Diagnosis not present

## 2022-07-02 DIAGNOSIS — S2249XA Multiple fractures of ribs, unspecified side, initial encounter for closed fracture: Secondary | ICD-10-CM

## 2022-07-02 DIAGNOSIS — T79A0XD Compartment syndrome, unspecified, subsequent encounter: Secondary | ICD-10-CM | POA: Diagnosis not present

## 2022-07-02 DIAGNOSIS — T79A0XA Compartment syndrome, unspecified, initial encounter: Secondary | ICD-10-CM | POA: Diagnosis present

## 2022-07-02 DIAGNOSIS — Z01818 Encounter for other preprocedural examination: Secondary | ICD-10-CM | POA: Diagnosis not present

## 2022-07-02 DIAGNOSIS — F10939 Alcohol use, unspecified with withdrawal, unspecified: Secondary | ICD-10-CM

## 2022-07-02 DIAGNOSIS — R188 Other ascites: Secondary | ICD-10-CM | POA: Diagnosis not present

## 2022-07-02 DIAGNOSIS — R000 Tachycardia, unspecified: Secondary | ICD-10-CM | POA: Diagnosis not present

## 2022-07-02 DIAGNOSIS — T8189XA Other complications of procedures, not elsewhere classified, initial encounter: Secondary | ICD-10-CM | POA: Diagnosis not present

## 2022-07-02 DIAGNOSIS — R609 Edema, unspecified: Secondary | ICD-10-CM | POA: Diagnosis not present

## 2022-07-02 HISTORY — PX: FASCIOTOMY: SHX132

## 2022-07-02 HISTORY — PX: APPLICATION OF WOUND VAC: SHX5189

## 2022-07-02 HISTORY — PX: EXTERNAL FIXATION LEG: SHX1549

## 2022-07-02 HISTORY — PX: CLOSED REDUCTION TIBIA: SHX5115

## 2022-07-02 LAB — CBC
HCT: 36.3 % — ABNORMAL LOW (ref 39.0–52.0)
Hemoglobin: 12 g/dL — ABNORMAL LOW (ref 13.0–17.0)
Hemoglobin: 9.9 g/dL — ABNORMAL LOW (ref 13.0–17.0)
MCH: 29.8 pg (ref 26.0–34.0)
MCHC: 33.1 g/dL (ref 30.0–36.0)
MCV: 90.1 fL (ref 80.0–100.0)
Platelets: 103 10*3/uL — ABNORMAL LOW (ref 150–400)
RBC: 4.03 MIL/uL — ABNORMAL LOW (ref 4.22–5.81)
RDW: 15.3 % (ref 11.5–15.5)
WBC: 7.4 10*3/uL (ref 4.0–10.5)
WBC: 9.5 10*3/uL (ref 4.0–10.5)
nRBC: 0 % (ref 0.0–0.2)

## 2022-07-02 LAB — HEPATITIS PANEL, ACUTE
HCV Ab: NONREACTIVE
Hep A IgM: NONREACTIVE
Hep B C IgM: NONREACTIVE
Hepatitis B Surface Ag: NONREACTIVE

## 2022-07-02 LAB — I-STAT CHEM 8, ED
BUN: 5 mg/dL — ABNORMAL LOW (ref 6–20)
Calcium, Ion: 1.04 mmol/L — ABNORMAL LOW (ref 1.15–1.40)
Chloride: 101 mmol/L (ref 98–111)
Creatinine, Ser: 0.7 mg/dL (ref 0.61–1.24)
Glucose, Bld: 133 mg/dL — ABNORMAL HIGH (ref 70–99)
HCT: 39 % (ref 39.0–52.0)
Hemoglobin: 13.3 g/dL (ref 13.0–17.0)
Potassium: 4.4 mmol/L (ref 3.5–5.1)
Sodium: 132 mmol/L — ABNORMAL LOW (ref 135–145)
TCO2: 18 mmol/L — ABNORMAL LOW (ref 22–32)

## 2022-07-02 LAB — AMYLASE: Amylase: 61 U/L (ref 28–100)

## 2022-07-02 LAB — COMPREHENSIVE METABOLIC PANEL
ALT: 59 U/L — ABNORMAL HIGH (ref 0–44)
AST: 188 U/L — ABNORMAL HIGH (ref 15–41)
Albumin: 3.9 g/dL (ref 3.5–5.0)
Alkaline Phosphatase: 75 U/L (ref 38–126)
Anion gap: 15 (ref 5–15)
BUN: 8 mg/dL (ref 6–20)
CO2: 19 mmol/L — ABNORMAL LOW (ref 22–32)
Calcium: 8.6 mg/dL — ABNORMAL LOW (ref 8.9–10.3)
Chloride: 101 mmol/L (ref 98–111)
Creatinine, Ser: 0.82 mg/dL (ref 0.61–1.24)
GFR, Estimated: >60 mL/min (ref >60)
Glucose, Bld: 135 mg/dL — ABNORMAL HIGH (ref 70–99)
Potassium: 4.3 mmol/L (ref 3.5–5.1)
Sodium: 135 mmol/L (ref 135–145)
Total Bilirubin: 2 mg/dL — ABNORMAL HIGH (ref 0.3–1.2)
Total Protein: 7.3 g/dL (ref 6.5–8.1)

## 2022-07-02 LAB — TYPE AND SCREEN
ABO/RH(D): O POS
Antibody Screen: NEGATIVE

## 2022-07-02 LAB — RESP PANEL BY RT-PCR (FLU A&B, COVID) ARPGX2
Influenza A by PCR: NEGATIVE
Influenza B by PCR: NEGATIVE
SARS Coronavirus 2 by RT PCR: NEGATIVE

## 2022-07-02 LAB — FOLATE: Folate: 6.3 ng/mL (ref >5.9)

## 2022-07-02 LAB — AMMONIA: Ammonia: 96 umol/L — ABNORMAL HIGH (ref 9–35)

## 2022-07-02 LAB — CREATININE, SERUM
Creatinine, Ser: 0.91 mg/dL (ref 0.61–1.24)
GFR, Estimated: >60 mL/min (ref >60)

## 2022-07-02 LAB — APTT: aPTT: 28 seconds (ref 24–36)

## 2022-07-02 LAB — VITAMIN B12: Vitamin B-12: 389 pg/mL (ref 180–914)

## 2022-07-02 LAB — CK: Total CK: 1513 U/L — ABNORMAL HIGH (ref 49–397)

## 2022-07-02 LAB — LIPASE, BLOOD: Lipase: 50 U/L (ref 11–51)

## 2022-07-02 LAB — PROTIME-INR
INR: 1.2 (ref 0.8–1.2)
Prothrombin Time: 15.2 seconds (ref 11.4–15.2)

## 2022-07-02 LAB — LACTIC ACID, PLASMA: Lactic Acid, Venous: 2.4 mmol/L (ref 0.5–1.9)

## 2022-07-02 LAB — SAMPLE TO BLOOD BANK

## 2022-07-02 LAB — ETHANOL: Alcohol, Ethyl (B): <10 mg/dL (ref <10)

## 2022-07-02 LAB — GAMMA GT: GGT: 1052 U/L — ABNORMAL HIGH (ref 7–50)

## 2022-07-02 SURGERY — EXTERNAL FIXATION, LOWER EXTREMITY
Anesthesia: General | Site: Leg Lower | Laterality: Left

## 2022-07-02 MED ORDER — PROPOFOL 10 MG/ML IV BOLUS
INTRAVENOUS | Status: AC
  Filled 2022-07-02: qty 20

## 2022-07-02 MED ORDER — SUGAMMADEX SODIUM 200 MG/2ML IV SOLN
INTRAVENOUS | Status: DC | PRN
  Administered 2022-07-02: 200 mg via INTRAVENOUS

## 2022-07-02 MED ORDER — PREGABALIN 75 MG PO CAPS
75.0000 mg | ORAL_CAPSULE | Freq: Three times a day (TID) | ORAL | Status: DC
  Administered 2022-07-02 – 2022-07-08 (×8): 75 mg via ORAL
  Filled 2022-07-02 (×9): qty 1

## 2022-07-02 MED ORDER — CHLORHEXIDINE GLUCONATE 0.12 % MT SOLN
15.0000 mL | Freq: Once | OROMUCOSAL | Status: AC
  Administered 2022-07-02: 15 mL via OROMUCOSAL

## 2022-07-02 MED ORDER — LACTATED RINGERS IV SOLN: INTRAVENOUS | Status: DC

## 2022-07-02 MED ORDER — ACETAMINOPHEN 500 MG PO TABS
1000.0000 mg | ORAL_TABLET | Freq: Two times a day (BID) | ORAL | Status: AC
  Administered 2022-07-02 – 2022-07-04 (×4): 1000 mg via ORAL
  Filled 2022-07-02 (×4): qty 2

## 2022-07-02 MED ORDER — DEXAMETHASONE SODIUM PHOSPHATE 10 MG/ML IJ SOLN
INTRAMUSCULAR | Status: DC | PRN
  Administered 2022-07-02: 10 mg via INTRAVENOUS

## 2022-07-02 MED ORDER — LORAZEPAM 1 MG PO TABS
1.0000 mg | ORAL_TABLET | ORAL | Status: DC | PRN
  Administered 2022-07-03 – 2022-07-04 (×4): 2 mg via ORAL
  Administered 2022-07-04: 1 mg via ORAL
  Filled 2022-07-02 (×4): qty 2
  Filled 2022-07-02 (×2): qty 1

## 2022-07-02 MED ORDER — CEFAZOLIN SODIUM-DEXTROSE 1-4 GM/50ML-% IV SOLN
1.0000 g | Freq: Once | INTRAVENOUS | Status: AC
  Administered 2022-07-02: 1 g via INTRAVENOUS
  Filled 2022-07-02: qty 50

## 2022-07-02 MED ORDER — SUCCINYLCHOLINE CHLORIDE 200 MG/10ML IV SOSY
PREFILLED_SYRINGE | INTRAVENOUS | Status: DC | PRN
  Administered 2022-07-02: 160 mg via INTRAVENOUS

## 2022-07-02 MED ORDER — ROCURONIUM BROMIDE 10 MG/ML (PF) SYRINGE
PREFILLED_SYRINGE | INTRAVENOUS | Status: DC | PRN
  Administered 2022-07-02: 15 mg via INTRAVENOUS
  Administered 2022-07-02: 30 mg via INTRAVENOUS

## 2022-07-02 MED ORDER — METHOCARBAMOL 500 MG PO TABS
1000.0000 mg | ORAL_TABLET | Freq: Three times a day (TID) | ORAL | Status: DC
  Administered 2022-07-03 – 2022-07-08 (×7): 1000 mg via ORAL
  Filled 2022-07-02 (×8): qty 2

## 2022-07-02 MED ORDER — MIDAZOLAM HCL 2 MG/2ML IJ SOLN
INTRAMUSCULAR | Status: DC | PRN
  Administered 2022-07-02: 2 mg via INTRAVENOUS

## 2022-07-02 MED ORDER — MIDAZOLAM HCL 2 MG/2ML IJ SOLN
INTRAMUSCULAR | Status: AC
  Filled 2022-07-02: qty 2

## 2022-07-02 MED ORDER — SODIUM CHLORIDE 0.9 % IV SOLN
INTRAVENOUS | Status: AC | PRN
  Administered 2022-07-02: 1000 mL via INTRAVENOUS

## 2022-07-02 MED ORDER — PROPOFOL 1000 MG/100ML IV EMUL
INTRAVENOUS | Status: AC
  Filled 2022-07-02: qty 100

## 2022-07-02 MED ORDER — IOHEXOL 300 MG/ML  SOLN
100.0000 mL | Freq: Once | INTRAMUSCULAR | Status: AC | PRN
  Administered 2022-07-02: 100 mL via INTRAVENOUS

## 2022-07-02 MED ORDER — PHENYLEPHRINE 80 MCG/ML (10ML) SYRINGE FOR IV PUSH (FOR BLOOD PRESSURE SUPPORT)
PREFILLED_SYRINGE | INTRAVENOUS | Status: AC
  Filled 2022-07-02: qty 10

## 2022-07-02 MED ORDER — ACETAMINOPHEN 325 MG PO TABS: 325.0000 mg | ORAL_TABLET | Freq: Four times a day (QID) | ORAL | Status: DC | PRN

## 2022-07-02 MED ORDER — LACTATED RINGERS IV SOLN: INTRAVENOUS | Status: DC | PRN

## 2022-07-02 MED ORDER — OXYCODONE HCL 5 MG PO TABS
5.0000 mg | ORAL_TABLET | ORAL | Status: DC | PRN
  Administered 2022-07-03: 5 mg via ORAL
  Filled 2022-07-02: qty 1

## 2022-07-02 MED ORDER — FENTANYL CITRATE (PF) 250 MCG/5ML IJ SOLN
INTRAMUSCULAR | Status: AC
  Filled 2022-07-02: qty 5

## 2022-07-02 MED ORDER — PHENYLEPHRINE HCL-NACL 20-0.9 MG/250ML-% IV SOLN
INTRAVENOUS | Status: DC | PRN
  Administered 2022-07-02: 35 ug/min via INTRAVENOUS

## 2022-07-02 MED ORDER — ADULT MULTIVITAMIN W/MINERALS CH
1.0000 | ORAL_TABLET | Freq: Every day | ORAL | Status: DC
  Administered 2022-07-02 – 2022-07-08 (×4): 1 via ORAL
  Filled 2022-07-02 (×6): qty 1

## 2022-07-02 MED ORDER — PROPOFOL 10 MG/ML IV BOLUS
INTRAVENOUS | Status: AC | PRN
  Administered 2022-07-02: 400 mg via INTRAVENOUS

## 2022-07-02 MED ORDER — FENTANYL CITRATE (PF) 250 MCG/5ML IJ SOLN
INTRAMUSCULAR | Status: DC | PRN
  Administered 2022-07-02 (×2): 100 ug via INTRAVENOUS

## 2022-07-02 MED ORDER — PHENYLEPHRINE 80 MCG/ML (10ML) SYRINGE FOR IV PUSH (FOR BLOOD PRESSURE SUPPORT)
PREFILLED_SYRINGE | INTRAVENOUS | Status: DC | PRN
  Administered 2022-07-02: 200 ug via INTRAVENOUS
  Administered 2022-07-02 (×2): 160 ug via INTRAVENOUS
  Administered 2022-07-02: 200 ug via INTRAVENOUS

## 2022-07-02 MED ORDER — THIAMINE HCL 100 MG/ML IJ SOLN
100.0000 mg | Freq: Every day | INTRAMUSCULAR | Status: DC
  Administered 2022-07-02 – 2022-07-03 (×2): 100 mg via INTRAVENOUS
  Filled 2022-07-02 (×2): qty 2

## 2022-07-02 MED ORDER — ONDANSETRON HCL 4 MG/2ML IJ SOLN
4.0000 mg | Freq: Four times a day (QID) | INTRAMUSCULAR | Status: DC | PRN
  Administered 2022-07-05 – 2022-07-09 (×5): 4 mg via INTRAVENOUS
  Filled 2022-07-02 (×5): qty 2

## 2022-07-02 MED ORDER — SERTRALINE HCL 100 MG PO TABS
200.0000 mg | ORAL_TABLET | Freq: Every day | ORAL | Status: DC
  Administered 2022-07-03 – 2022-07-08 (×3): 200 mg via ORAL
  Filled 2022-07-02 (×4): qty 2

## 2022-07-02 MED ORDER — METOCLOPRAMIDE HCL 5 MG/ML IJ SOLN
5.0000 mg | Freq: Three times a day (TID) | INTRAMUSCULAR | Status: DC | PRN
  Administered 2022-07-06 – 2022-07-08 (×3): 10 mg via INTRAVENOUS
  Filled 2022-07-02 (×4): qty 2

## 2022-07-02 MED ORDER — LORAZEPAM 2 MG/ML IJ SOLN
0.0000 mg | Freq: Four times a day (QID) | INTRAMUSCULAR | Status: DC
  Administered 2022-07-02 – 2022-07-03 (×3): 2 mg via INTRAVENOUS
  Filled 2022-07-02 (×3): qty 1

## 2022-07-02 MED ORDER — PROPOFOL 10 MG/ML IV BOLUS: 4.0000 mg/kg | Freq: Once | INTRAVENOUS | Status: DC

## 2022-07-02 MED ORDER — MORPHINE SULFATE (PF) 4 MG/ML IV SOLN
4.0000 mg | Freq: Once | INTRAVENOUS | Status: AC
  Administered 2022-07-02: 4 mg via INTRAVENOUS
  Filled 2022-07-02: qty 1

## 2022-07-02 MED ORDER — ASCORBIC ACID 500 MG PO TABS
1000.0000 mg | ORAL_TABLET | Freq: Every day | ORAL | Status: DC
  Administered 2022-07-02 – 2022-07-08 (×4): 1000 mg via ORAL
  Filled 2022-07-02 (×5): qty 2

## 2022-07-02 MED ORDER — ENOXAPARIN SODIUM 40 MG/0.4ML IJ SOSY
40.0000 mg | PREFILLED_SYRINGE | INTRAMUSCULAR | Status: DC
  Administered 2022-07-03: 40 mg via SUBCUTANEOUS
  Filled 2022-07-02: qty 0.4

## 2022-07-02 MED ORDER — METHOCARBAMOL 1000 MG/10ML IJ SOLN
1000.0000 mg | Freq: Three times a day (TID) | INTRAVENOUS | Status: DC
  Administered 2022-07-02: 1000 mg via INTRAVENOUS
  Filled 2022-07-02: qty 10

## 2022-07-02 MED ORDER — ONDANSETRON HCL 4 MG/2ML IJ SOLN
INTRAMUSCULAR | Status: DC | PRN
  Administered 2022-07-02: 4 mg via INTRAVENOUS

## 2022-07-02 MED ORDER — PROPOFOL 10 MG/ML IV BOLUS
INTRAVENOUS | Status: DC | PRN
  Administered 2022-07-02: 130 mg via INTRAVENOUS

## 2022-07-02 MED ORDER — 0.9 % SODIUM CHLORIDE (POUR BTL) OPTIME
TOPICAL | Status: DC | PRN
  Administered 2022-07-02: 1000 mL

## 2022-07-02 MED ORDER — CEFAZOLIN SODIUM-DEXTROSE 1-4 GM/50ML-% IV SOLN
1.0000 g | Freq: Four times a day (QID) | INTRAVENOUS | Status: AC
  Administered 2022-07-02 – 2022-07-03 (×3): 1 g via INTRAVENOUS
  Filled 2022-07-02 (×3): qty 50

## 2022-07-02 MED ORDER — LACTATED RINGERS IV BOLUS
1000.0000 mL | Freq: Once | INTRAVENOUS | Status: AC
  Administered 2022-07-02: 1000 mL via INTRAVENOUS

## 2022-07-02 MED ORDER — METOCLOPRAMIDE HCL 5 MG PO TABS: 5.0000 mg | ORAL_TABLET | Freq: Three times a day (TID) | ORAL | Status: DC | PRN

## 2022-07-02 MED ORDER — DOCUSATE SODIUM 100 MG PO CAPS
100.0000 mg | ORAL_CAPSULE | Freq: Two times a day (BID) | ORAL | Status: DC
  Administered 2022-07-02 – 2022-07-03 (×2): 100 mg via ORAL
  Filled 2022-07-02 (×5): qty 1

## 2022-07-02 MED ORDER — CLONAZEPAM 1 MG PO TABS: 1.0000 mg | ORAL_TABLET | Freq: Every day | ORAL | Status: DC | PRN

## 2022-07-02 MED ORDER — LORAZEPAM 2 MG/ML IJ SOLN
1.0000 mg | INTRAMUSCULAR | Status: DC | PRN
  Administered 2022-07-04 – 2022-07-05 (×7): 2 mg via INTRAVENOUS
  Filled 2022-07-02 (×7): qty 1

## 2022-07-02 MED ORDER — THIAMINE HCL 100 MG PO TABS
100.0000 mg | ORAL_TABLET | Freq: Every day | ORAL | Status: DC
  Administered 2022-07-04: 100 mg via ORAL
  Filled 2022-07-02 (×2): qty 1

## 2022-07-02 MED ORDER — LIDOCAINE 2% (20 MG/ML) 5 ML SYRINGE
INTRAMUSCULAR | Status: DC | PRN
  Administered 2022-07-02: 60 mg via INTRAVENOUS

## 2022-07-02 MED ORDER — ONDANSETRON HCL 4 MG/2ML IJ SOLN
4.0000 mg | Freq: Once | INTRAMUSCULAR | Status: AC
  Administered 2022-07-02: 4 mg via INTRAVENOUS
  Filled 2022-07-02: qty 2

## 2022-07-02 MED ORDER — LORAZEPAM 2 MG/ML IJ SOLN: 0.0000 mg | Freq: Two times a day (BID) | INTRAMUSCULAR | Status: DC

## 2022-07-02 MED ORDER — ONDANSETRON HCL 4 MG PO TABS: 4.0000 mg | ORAL_TABLET | Freq: Four times a day (QID) | ORAL | Status: DC | PRN

## 2022-07-02 MED ORDER — POLYETHYLENE GLYCOL 3350 17 G PO PACK
17.0000 g | PACK | Freq: Every day | ORAL | Status: DC
  Administered 2022-07-02: 17 g via ORAL
  Filled 2022-07-02 (×6): qty 1

## 2022-07-02 MED ORDER — FOLIC ACID 1 MG PO TABS
1.0000 mg | ORAL_TABLET | Freq: Every day | ORAL | Status: DC
  Administered 2022-07-02 – 2022-07-08 (×4): 1 mg via ORAL
  Filled 2022-07-02 (×6): qty 1

## 2022-07-02 MED ORDER — OXYCODONE HCL 5 MG PO TABS
10.0000 mg | ORAL_TABLET | ORAL | Status: DC | PRN
  Administered 2022-07-04: 15 mg via ORAL
  Administered 2022-07-04: 10 mg via ORAL
  Filled 2022-07-02: qty 3
  Filled 2022-07-02: qty 2

## 2022-07-02 MED ORDER — ORAL CARE MOUTH RINSE: 15.0000 mL | Freq: Once | OROMUCOSAL | Status: AC

## 2022-07-02 MED ORDER — HYDROMORPHONE HCL 1 MG/ML IJ SOLN
0.5000 mg | INTRAMUSCULAR | Status: DC | PRN
  Administered 2022-07-02 – 2022-07-05 (×2): 1 mg via INTRAVENOUS
  Administered 2022-07-05: 0.5 mg via INTRAVENOUS
  Administered 2022-07-06 – 2022-07-16 (×17): 1 mg via INTRAVENOUS
  Filled 2022-07-02 (×21): qty 1

## 2022-07-02 MED ORDER — HYDROMORPHONE HCL 2 MG/ML IJ SOLN
1.0000 mg | Freq: Once | INTRAMUSCULAR | Status: AC
  Administered 2022-07-02: 1 mg via INTRAVENOUS
  Filled 2022-07-02: qty 1

## 2022-07-02 SURGICAL SUPPLY — 53 items
BAG COUNTER SPONGE SURGICOUNT (BAG) ×1 IMPLANT
BAG SPNG CNTER NS LX DISP (BAG) ×1
BANDAGE ELASTIC 4 VELCRO NS (GAUZE/BANDAGES/DRESSINGS) IMPLANT
BANDAGE ESMARK 6X9 LF (GAUZE/BANDAGES/DRESSINGS) ×1 IMPLANT
BAR EXFX 500X11 NS LF (EXFIX) ×2
BAR GLASS FIBER EXFX 11X500 (EXFIX) IMPLANT
BNDG CMPR 9X6 STRL LF SNTH (GAUZE/BANDAGES/DRESSINGS) ×1
BNDG COHESIVE 6X5 TAN STRL LF (GAUZE/BANDAGES/DRESSINGS) IMPLANT
BNDG ELASTIC 4X5.8 VLCR STR LF (GAUZE/BANDAGES/DRESSINGS) ×1 IMPLANT
BNDG ELASTIC 6X5.8 VLCR STR LF (GAUZE/BANDAGES/DRESSINGS) ×1 IMPLANT
BNDG ESMARK 6X9 LF (GAUZE/BANDAGES/DRESSINGS) ×1
BNDG GAUZE ELAST 4 BULKY (GAUZE/BANDAGES/DRESSINGS) ×2 IMPLANT
BRUSH SCRUB EZ PLAIN DRY (MISCELLANEOUS) ×2 IMPLANT
COVER SURGICAL LIGHT HANDLE (MISCELLANEOUS) ×2 IMPLANT
CUFF TOURN SGL QUICK 18X4 (TOURNIQUET CUFF) IMPLANT
DRAPE C-ARM 42X72 X-RAY (DRAPES) IMPLANT
DRAPE C-ARMOR (DRAPES) ×1 IMPLANT
DRAPE U-SHAPE 47X51 STRL (DRAPES) ×1 IMPLANT
DRSG ADAPTIC 3X8 NADH LF (GAUZE/BANDAGES/DRESSINGS) ×1 IMPLANT
DRSG MEPITEL 4X7.2 (GAUZE/BANDAGES/DRESSINGS) IMPLANT
ELECT REM PT RETURN 9FT ADLT (ELECTROSURGICAL) ×1
ELECTRODE REM PT RTRN 9FT ADLT (ELECTROSURGICAL) ×1 IMPLANT
EVACUATOR 1/8 PVC DRAIN (DRAIN) IMPLANT
GAUZE SPONGE 4X4 12PLY STRL (GAUZE/BANDAGES/DRESSINGS) ×1 IMPLANT
GLOVE BIO SURGEON STRL SZ7.5 (GLOVE) ×1 IMPLANT
GLOVE BIO SURGEON STRL SZ8 (GLOVE) ×1 IMPLANT
GLOVE BIOGEL PI IND STRL 7.5 (GLOVE) ×1 IMPLANT
GLOVE BIOGEL PI IND STRL 8 (GLOVE) ×1 IMPLANT
GLOVE BIOGEL PI INDICATOR 7.5 (GLOVE) ×1
GLOVE BIOGEL PI INDICATOR 8 (GLOVE) ×1
GLOVE SURG ORTHO LTX SZ7.5 (GLOVE) ×2 IMPLANT
GOWN STRL REUS W/ TWL LRG LVL3 (GOWN DISPOSABLE) ×2 IMPLANT
GOWN STRL REUS W/ TWL XL LVL3 (GOWN DISPOSABLE) ×1 IMPLANT
GOWN STRL REUS W/TWL LRG LVL3 (GOWN DISPOSABLE) ×2
GOWN STRL REUS W/TWL XL LVL3 (GOWN DISPOSABLE) ×1
HALF PIN 5.0X160 (EXFIX) IMPLANT
KIT BASIN OR (CUSTOM PROCEDURE TRAY) ×1 IMPLANT
KIT TURNOVER KIT B (KITS) ×1 IMPLANT
MANIFOLD NEPTUNE II (INSTRUMENTS) ×1 IMPLANT
NEEDLE 22X1 1/2 (OR ONLY) (NEEDLE) IMPLANT
NS IRRIG 1000ML POUR BTL (IV SOLUTION) ×1 IMPLANT
PACK ORTHO EXTREMITY (CUSTOM PROCEDURE TRAY) ×1 IMPLANT
PAD ARMBOARD 7.5X6 YLW CONV (MISCELLANEOUS) ×2 IMPLANT
PADDING CAST COTTON 6X4 STRL (CAST SUPPLIES) ×2 IMPLANT
PIN CLAMP 2BAR 75MM BLUE (EXFIX) IMPLANT
PIN HALF YELLOW 5X160X35 (EXFIX) IMPLANT
SPONGE T-LAP 18X18 ~~LOC~~+RFID (SPONGE) ×1 IMPLANT
STAPLER VISISTAT 35W (STAPLE) IMPLANT
STOCKINETTE IMPERVIOUS LG (DRAPES) IMPLANT
STRIP CLOSURE SKIN 1/2X4 (GAUZE/BANDAGES/DRESSINGS) IMPLANT
TOWEL GREEN STERILE (TOWEL DISPOSABLE) ×2 IMPLANT
TOWEL GREEN STERILE FF (TOWEL DISPOSABLE) ×2 IMPLANT
UNDERPAD 30X36 HEAVY ABSORB (UNDERPADS AND DIAPERS) ×1 IMPLANT

## 2022-07-02 NOTE — ED Provider Notes (Signed)
Care of patient received in transfer from Dr. Elpidio Anis.  This patient presented to [facility] long ED after a mechanical fall at home down approximately 8 steps.  He underwent imaging studies that showed a bicondylar fracture and lateral tibial plateau fracture with large hemarthrosis.  His leg was taught and there is concern for compartment syndrome.  He was transferred to [facility], ED for emergent orthopedic surgery evaluation. Physical Exam  BP (!) 160/84   Pulse (!) 105   Temp 98.2 F (36.8 C)   Resp (!) 22   SpO2 96%   Physical Exam Vitals and nursing note reviewed.  Constitutional:      Appearance: Normal appearance. He is well-developed. He is ill-appearing and diaphoretic. He is not toxic-appearing.  HENT:     Head: Normocephalic and atraumatic.     Right Ear: External ear normal.     Left Ear: External ear normal.     Nose: Nose normal.     Mouth/Throat:     Mouth: Mucous membranes are moist.     Pharynx: Oropharynx is clear.     Comments: Blood present in oral cavity.  No active bleeding. Eyes:     Extraocular Movements: Extraocular movements intact.     Conjunctiva/sclera: Conjunctivae normal.  Cardiovascular:     Rate and Rhythm: Regular rhythm. Tachycardia present.  Pulmonary:     Effort: Pulmonary effort is normal. No respiratory distress.  Abdominal:     General: There is no distension.     Palpations: Abdomen is soft.     Tenderness: There is no abdominal tenderness.  Musculoskeletal:        General: Swelling and tenderness present.     Cervical back: Normal range of motion and neck supple.     Comments: Tense, swollen compartments of distal left lower extremity.  Pain is out of proportion.  Skin:    General: Skin is warm.     Coloration: Skin is not jaundiced or pale.  Neurological:     General: No focal deficit present.     Mental Status: He is alert and oriented to person, place, and time.  Psychiatric:        Mood and Affect: Mood normal.        Behavior:  Behavior normal.        Thought Content: Thought content normal.        Judgment: Judgment normal.     Procedures  .Sedation  Date/Time: 07/02/2022 12:45 PM  Performed by: Gloris Manchester, MD Authorized by: Gloris Manchester, MD   Consent:    Consent obtained:  Verbal   Consent given by:  Patient   Risks discussed:  Allergic reaction, prolonged hypoxia resulting in organ damage, dysrhythmia, inadequate sedation, respiratory compromise necessitating ventilatory assistance and intubation, nausea and vomiting Universal protocol:    Immediately prior to procedure, a time out was called: yes   Pre-sedation assessment:    Time since last food or drink:  8 hours   ASA classification: class 2 - patient with mild systemic disease     Mouth opening:  2 finger widths   Thyromental distance:  3 finger widths   Mallampati score:  II - soft palate, uvula, fauces visible   Pre-sedation assessments completed and reviewed: airway patency, cardiovascular function, hydration status, mental status, nausea/vomiting, pain level and respiratory function     Pre-sedation assessment completed:  07/02/2022 12:40 PM Immediate pre-procedure details:    Reassessment: Patient reassessed immediately prior to procedure  Reviewed: vital signs     Verified: bag valve mask available, emergency equipment available, intubation equipment available, IV patency confirmed, oxygen available and suction available   Procedure details (see MAR for exact dosages):    Preoxygenation:  Room air   Sedation:  Propofol   Intended level of sedation: moderate (conscious sedation)   Analgesia:  Hydromorphone   Intra-procedure monitoring:  Blood pressure monitoring, cardiac monitor, continuous capnometry, continuous pulse oximetry, frequent vital sign checks and frequent LOC assessments   Intra-procedure events: none     Total Provider sedation time (minutes):  45 Post-procedure details:    Post-sedation assessment completed:  07/02/2022  1:30 PM   Attendance: Constant attendance by certified staff until patient recovered     Post-sedation assessments completed and reviewed: airway patency, cardiovascular function, hydration status, mental status, nausea/vomiting, pain level and respiratory function     Patient is stable for discharge or admission: yes     Procedure completion:  Tolerated well, no immediate complications   ED Course / MDM   Clinical Course as of 07/02/22 1233  Sat Jul 02, 2022  1044 Consulted Ortho for concern of bicondylar fracture and lateral tibial plateau fracture with large hemarthrosis and exam concerning for compartment syndrome. [VB]  1106 Patient's pan scan notable for tibial plateau fracture, T7 compression fracture and scalp hematoma, no ICH.  Clinically cleared patient C-spine.  Spoke with [facility] of orthopedics who request ED to ED transfer and patient will be accepted under Dr. Rudean Haskell service.Ed to Ed accepted by Gloris Manchester MD [VB]    Clinical Course User Index [VB] Mardene Sayer, MD   Medical Decision Making Amount and/or Complexity of Data Reviewed Labs: ordered. Radiology: ordered.  Risk Prescription drug management.   On initial assessment, patient is awake and alert.  He is diaphoretic.  Distal left lower extremity is swollen and tense.  Orthopedic surgeon, Dr. Carola Frost, was notified of the patient's arrival in the ED.  He evaluated the patient at bedside and made plans to perform emergent fasciotomy in the ED due to no OR availability.  Sedation was provided for this procedure.  During his procedure, compartment pressures were measured utilizing Stryker needle and found to be severely elevated.  Patient underwent fasciotomy and tolerated this well.  Shortly thereafter, patient was taken to OR for external fixation.       Gloris Manchester, MD 07/03/22 [phone]

## 2022-07-02 NOTE — Op Note (Signed)
PATIENT:  Ryan Lynch  41 y.o.  PRE-OPERATIVE DIAGNOSIS:   1. Left bicondylar tibial plateau fracture 2. Compartment syndrome  POST-OPERATIVE DIAGNOSIS:   1. Left bicondylar tibial plateau fracture 2. Compartment syndrome  PROCEDURE:  Procedure(s): 1. CLOSED REDUCTION OF LEFT TIBIAL PLATEAU 2. APPLICATION OF SPANNING EXTERNAL FIXATOR LEFT KNEE 3. ASPIRATION OF LEFT KNEE JOINT 4. RE-EXPLORATION AND FORMAL EXTENSION OF FOUR COMPARTMENT FASCIOTOMIES LEFT LEG WITH HEMOSTASIS 5. DRESSING CHANGE UNDER ANESTHESIA  SURGEON:  Surgeon(s) and Role:    Myrene Galas, MD - Primary  PHYSICIAN ASSISTANT: Montez Morita, PA-C  ANESTHESIA:   general  EBL:  50 CC   BLOOD ADMINISTERED:none  DRAINS: none   LOCAL MEDICATIONS USED:  NONE  SPECIMEN:  No Specimen  DISPOSITION OF SPECIMEN:  N/A  COUNTS:  YES  TOURNIQUET:  None.  DICTATION: .Note written in EPIC  PLAN OF CARE: Admit to inpatient   PATIENT DISPOSITION:  PACU - hemodynamically stable.   Delay start of Pharmacological VTE agent (>24hrs) due to surgical blood loss or risk of bleeding: no  BRIEF SUMMARY OF INDICATIONS FOR PROCEDURE:  Ryan Lynch is a 41 y.o. -year-old who sustained knee injury from fall down the stairs with severely impacted and displaced fractures of the tibial plateau. The patient was seen and evaluated preoperatively where substantial swelling and pain with passive stretch was noted, in addition to pallor, paresthesia, and paralysis. Given the advanced clinical symptoms and signs and the lack of immediate OR availability we had to proceed with emergent fasciotomies in the emergency department. Pressures exceeded 100 mm Hg in all four compartments. Following release, saline and guaze dressings were applied. Cautery was not available and lighting was poor. The patient now presents for reexploration, hemostasis, and probable extension of his four compartment fasciotomies, as well as closed reduction and spanning  external fixation with delayed definitive internal fixation.  Risks discussed include pin tract infection, the need for staged procedures, heart attack, stroke, anesthetic reaction, possible need for skin grafting, malunion, nonunion, infection, and multiple others.  BRIEF SUMMARY OF PROCEDURE:  After administration of preoperative antibiotics, the patient was taken to the operating room where general anesthesia was induced. The left lower extremity was prepped and draped in usual sterile fashion while carefully holding traction. The saturated sanguinous gauze and kerlix were removed after anesthesia but little active bleeding was immediately visible. After time-out, the incision was extended to full 20 cm in the mid aspect of the leg. The anterior compartment fasciotomy was further extended proximally and distally and then hemostasis obtained with electrocautery. Septum was palpated with the tip of the scissors in the posterior direction to confirm appropriate placement given some hemorrhage within the tissues. The lateral compartment release was similarly extended proximally and distally curving the scissor tips posterior and away from the superficial peroneal nerve. Tissues in both these compartments were hemorrhagic and edematous but were decidedly more pink and less dark than in the ED. Additional hemostasis was performed. Attention was then turned to the medial side. The incision was extended from the distal third of the leg both proximally and distally. Dissection was carried down where the saphenous vein and nerve were both retracted posteriorly. I used cautery on the branches of the saphenous vein. The superficial posterior compartment fascia was Further released proximally. This muscle remained darker then the lateral side, both the gastroc and soleus. I then extended release of the soleus insertion along the posteromedial edge with cautery further exposing the deep compartment and releasing this  compartment more fully both proximally and distally. This muscle also was more hemorrhagic and ecchymotic than the lateral side. It was released under direct visualization down to the flexor retinaculum of the ankle.    C-arm was brought in.  Appropriate starting points were identified on the femur and tibia using a clamp for proper spacing and orientation.  Small stab incisions were made.  Tonsil clamp  used to spread down to bone.  The pins were advanced to the far cortex.  These were then checked with lateral flouro images to confirm appropriate position just into the far cortex.  After applying and securing clamps on the femur and the tibia, two bars were placed and a closed manipulation performed of both the tibial plateau and the shaft area restoring appropriate length, alignment, and rotation.  Once this was confirmed with AP and lateral C-arm images, my assistant terminally tightened all of the clamps. We dressed the pin sites with Kerlix and applied a sterile gently compressive dressing from foot to thigh.   We then turned our attention to the large knee hemarthrosis, which contributed to soft tissue swelling and internal pressure on the skin. Using an 18 gauge needle and 30 cc syringe, 90 cc of sanguinous fluid was aspirated from the knee.  The wounds were all irrigated thoroughly and then wound VACs applied.  This was a 20 x 8 cm VAC on the lateral side and a 20 x 9 cm VAC on the medial side.  Sterile gently compressive dressings were then applied from foot to ankle using two Ace wraps. Montez Morita, PA-C assisted me throughout and his assistance was absolutely necessary as 1 had to produce and control the reduction during clamp adjustments and we were able to work simultaneously on application of the wound vac dressing change of the medial and lateral fasciotomies.  PROGNOSIS:  The patient will need to return to the OR in 3-4 days for repeat washout and possible advancement toward closure  of the wounds. It is likely he will require skin grafting.  Given his alcohol abuse withdrawal is anticipated and could complicate his hospital course. His early improvement in sensation, motor function, and pain relief are encouraging.      Doralee Albino. Carola Frost, M.D.

## 2022-07-02 NOTE — Progress Notes (Signed)
Patient in CT

## 2022-07-02 NOTE — Consult Note (Addendum)
Initial Consultation Note   Patient: Ryan Lynch RSW:[id number] DOB: 1981/03/04 PCP: Patient, No Pcp Per DOA: 07/02/2022 DOS: the patient was seen and examined on 07/02/2022 Primary service: Myrene Galas, MD  Referring physician: Dr. Carola Frost  Reason for consult: medical management   Assessment/Plan: Assessment and Plan: Tibial plateau fracture, left with compartment syndrome  41 year old presenting to ED after fall with left tibial plateau fracture and compartment syndrome -admitted by ortho trauma, Dr. Carola Frost -s/p emergent fasciotomy and OR management today  -wound vac applied -pain management per ortho -repeat CBC for low platelets, if <100K hold VTE  -CK pending, continue IVF  -check UA  -renal fx wnl   Alcohol use History of on and off alcohol use, but has been drinking daily for over a year Light beer, on average 4-10/day CIWA protocol, would like to detox and quit Thiamine, MV, folic acid  Check UDS  SW consult   Transaminitis AST: 188 and ALT 59 Likely secondary to alcohol abuse vs. Rhabdo, CK pending  Check  Hepatitis panel  CT of liver with hepatic steatosis Gallstone with no biliary duct dilatation or cholecystitis.  Continue IVF Trend  Thrombocytopenia ([name]) NSAID use vs. Alcohol use vs. Hypersplenism due to liver disease vs lab error Trend and follow  INR wnl  Avoid NSAIDs  Repeat CBC and hold VTE prophylaxis if < 100K.    bilateral adrenal gland nodules  -31mm indeterminate left adrenal hypodense mass. Indeterminate 33mm right adrenal gland mass.  - will need outpatient, non emergent CT or MRI of the adrenal glands for further characterization.   Compression fracture of T7 vertebra ([name]) Mild anterior vertebral compression fracture, approximately 20% loss of height Vitamin D pending Pain control per ortho   Anxiety Continue zoloft and klonopin Big driver of his drinking Recommend outpatient counseling and PCP f/u     TR[facility] will continue to follow  the patient.  HPI: Ryan Lynch is a 41 y.o. male with past medical history of alcohol use and anxiety who presented to Ed after a fall at his home. He thinks he tripped on a cat toy around 4AM this morning and fell down his stairs. His father was up at this time, but he did not want to come to the hospital and thought it would get better. Due to pain and his leg not getting better like he thought, Ems was called at 800AM. He was originally seen in [facility] ED, but due to complexity of fracture, transferred to [facility] for ortho traumatologist. Ortho saw him immediately upon arrival to ED. Due to compartment syndrome he needed emergent fasciotomy, but no OR availability and was thus performed bedside in ED. He was then taken to the OR for operative management of his compartment syndrome and fracture. TRH was asked to consult for medical management.   He has no known medical history except for anxiety and ADHD. He has had alcohol use issues that come and go, but over a year ago started to consistently drink after a traumatic event. He lives with  his parents and his dad states his alcohol use varies. He only drinks light beer and averages 4-10/day. Last drink was last night. He does not smoke or use drugs.    He has been feeling good prior to his fall. Denies any fever/chills, vision changes/headaches, chest pain or palpitations, shortness of breath or cough, abdominal pain, N/V/D, dysuria or leg swelling.    Review of Systems: As mentioned in the history of present illness. All  other systems reviewed and are negative. History reviewed. No pertinent past medical history. Past Surgical History:  Procedure Laterality Date   ANTERIOR CRUCIATE LIGAMENT REPAIR Right    SHOULDER SURGERY Bilateral    Social History:  reports that he has never smoked. He does not have any smokeless tobacco history on file. He reports current alcohol use. He reports that he does not use drugs.  No Known Allergies  History reviewed. No  pertinent family history.  Prior to Admission medications   Medication Sig Start Date End Date Taking? Authorizing Provider  clonazePAM (KLONOPIN) 1 MG tablet Take 1 mg by mouth daily as needed for anxiety. 06/14/22  Yes [provider]  ibuprofen (ADVIL) 200 MG tablet Take 600 mg by mouth daily as needed (pain).   Yes [provider]  sertraline (ZOLOFT) 100 MG tablet Take 200 mg by mouth daily. 06/14/22  Yes [provider]    Physical Exam: Vitals:   07/02/22 1645 07/02/22 1700 07/02/22 1724 07/02/22 1955  BP: 120/69 110/76 119/77 112/71  Pulse: (!) 106 (!) 104 (!) 107 (!) 102  Resp: 11 13 16 16   Temp:  98.5 F (36.9 C) 98.4 F (36.9 C)   TempSrc:   Oral   SpO2: 93% 96% 95%   Weight:       Physical Exam Vitals reviewed.  Constitutional:      Appearance: Normal appearance. He is diaphoretic.  HENT:     Head: Normocephalic and atraumatic.     Mouth/Throat:     Mouth: Mucous membranes are dry.  Cardiovascular:     Rate and Rhythm: Regular rhythm. Tachycardia present.     Pulses: Normal pulses.     Heart sounds: Normal heart sounds.  Pulmonary:     Effort: Pulmonary effort is normal.     Breath sounds: Normal breath sounds.  Abdominal:     General: Bowel sounds are normal.     Palpations: Abdomen is soft.  Musculoskeletal:     Cervical back: Normal range of motion and neck supple.     Comments: Left lower leg wrapped with wound vac and fixator   Neurological:     General: No focal deficit present.     Mental Status: He is alert and oriented to person, place, and time.  Psychiatric:        Mood and Affect: Mood normal.        Behavior: Behavior normal.     Data Reviewed:  Hgb: 12 Platelets: 103, INR: 1.2 Lactic acid: 2.4 AST: 188 ALT: 59  CT abdomen/pelvis: likely hepatic steatosis. Gallstone in fundus, no biliary ductal dilatation.  37mm indeterminate left adrenal hypodense mass. Indeterminate 49mm right adrenal gland mass.  T7 mild  anterior vertebral body compression fracture with app. 20% height loss   Family Communication: father and mother at bedside: 9m and Casimiro Needle  Primary team communication: Lawson Fiscal, PA  Thank you very much for involving Montez Morita in the care of your patient.  Author: [place], MD 07/02/2022 10:02 PM  For on call review [email or web address].

## 2022-07-02 NOTE — Assessment & Plan Note (Addendum)
AST/ALT elevated to 188/59 on admission respectively Secondary to rhabdomyolysis from compartment syndrome and possibly alcohol use.  Generally improved Does have elevated alk phos, mild bili elevation today Will continue to monitor

## 2022-07-02 NOTE — Assessment & Plan Note (Addendum)
Treating withdrawal as above  Daily consumption. Alcohol level undetectable on admission. Patient hoping to detox. Started on CIWA on admission. Social work consulted.

## 2022-07-02 NOTE — Assessment & Plan Note (Addendum)
NSAID use vs. Alcohol use vs. Hypersplenism due to liver disease vs lab error Trend and follow  INR wnl  Avoid NSAIDs  Repeat CBC and hold VTE prophylaxis if < 100K.

## 2022-07-02 NOTE — Transfer of Care (Signed)
Immediate Anesthesia Transfer of Care Note  Patient: Ryan Lynch  Procedure(s) Performed: EXTERNAL FIXATION LEG (Left: Leg Lower) APPLICATION OF WOUND VAC (Left: Leg Lower) FASCIOTOMY (Left: Leg Lower) CLOSED REDUCTION TIBIA (Left: Leg Lower)  Patient Location: PACU  Anesthesia Type:General  Level of Consciousness: awake and alert   Airway & Oxygen Therapy: Patient Spontanous Breathing and Patient connected to face mask oxygen  Post-op Assessment: Report given to RN and Post -op Vital signs reviewed and stable  Post vital signs: Reviewed and stable  Last Vitals:  Vitals Value Taken Time  BP 158/78 07/02/22 1617  Temp    Pulse 107 07/02/22 1622  Resp 16 07/02/22 1622  SpO2 99 % 07/02/22 1622  Vitals shown include unvalidated device data.  Last Pain:  Vitals:   07/02/22 1306  TempSrc: Oral  PainSc: 10-Worst pain ever         Complications: No notable events documented.

## 2022-07-02 NOTE — Progress Notes (Signed)
Orthopedic Tech Progress Note Patient Details:  Ryan Lynch 1981-11-07 [id number]   Ortho Devices Type of Ortho Device: Prafo boot/shoe Ortho Device/Splint Location: LLE Ortho Device/Splint Interventions: Ordered, Application, Adjustment   Post Interventions Patient Tolerated: Well Instructions Provided: Care of device  Ryan Lynch Ryan Lynch 07/02/2022, 7:07 PM

## 2022-07-02 NOTE — ED Notes (Signed)
Critical Lab Value Lactic Acid 2.4  Reported to V.Elpidio Anis

## 2022-07-02 NOTE — Assessment & Plan Note (Addendum)
Mild anterior vertebral compression fracture, approximately 20% loss of height Vitamin D pending Pain control per ortho

## 2022-07-02 NOTE — Assessment & Plan Note (Addendum)
Hold zoloft -Hold Klonopin while on CIWA/Librium taper

## 2022-07-02 NOTE — Assessment & Plan Note (Addendum)
Patient admitted to trauma service. S/p emergent fasciotomy on 8/19 for compartment syndrome. Management per primary, s/p surgery for 8/24, note pending

## 2022-07-02 NOTE — Op Note (Addendum)
PATIENT:  Ryan Lynch  41 y.o.  PRE-OPERATIVE DIAGNOSIS:   1. Compartment syndrome, left leg 2. Left bicondylar tibial plateau fracture  POST-OPERATIVE DIAGNOSIS:   1. Compartment syndrome, left leg 2. Left bicondylar tibial plateau fracture  PROCEDURE:  Procedure(s): 1. MEASUREMENT OF COMPARTMENT PRESSURES FOUR COMPARTMENTS LEG LEFT 2. EMERGENT FOUR COMPARTMENT FASCIOTOMIES LEFT LEG  SURGEON:  Surgeon(s) and Role:    Myrene Galas, MD - Primary  PHYSICIAN ASSISTANT: Montez Morita, PA-C  ANESTHESIA:   sedation in ED by Dr. Gloris Manchester  EBL:  100 cc  BLOOD ADMINISTERED:none  DRAINS: none   LOCAL MEDICATIONS USED:  NONE  SPECIMEN:  No Specimen  DISPOSITION OF SPECIMEN:  N/A  COUNTS:  YES  TOURNIQUET:  * No tourniquets in log *  DICTATION: Note written in EPIC  PLAN OF CARE: Admit to inpatient   PATIENT DISPOSITION:  Transiently in the room then to the OR - hemodynamically stable.   BRIEF SUMMARY OF INDICATIONS FOR PROCEDURE:  Ryan Lynch is a 41 y.o. -year-old who sustained knee injury from fall down the stairs with severely impacted and displaced fractures of the tibial plateau. The patient was seen and evaluated in [facility] ED within minutes of arrival where substantial swelling and pain with passive stretch was noted. I discussed with him the necessity of compartment release and that there was no room in the operating room at this time. Risks discussed were infection, bleeding, the need for staged procedures, heart attack, stroke, anesthetic reaction, possible need for skin grafting, malunion, nonunion, infection, and multiple others.  Specifically, we discussed potential for limb loss and chronic pain in the event of muscle necrosis.  BRIEF SUMMARY OF PROCEDURE:  After administration of preoperative antibiotics, the patient underwent conscious sedation anesthesia in the emergency room. The left lower extremity was prepped and draped with towels and my assistant carefully  holding traction.  I prepared the area by shaving the patient and using chlorhexidene scrub then betadine paint. With the Stryker device I checked pressures in each compartment, which measured 116 anterior, 145 lateral, 142 superficial posterior, 150 deep posterior, all severely elevated.   A 16 cm lateral incision centered over the fibula was made. The interval between the subcutaneous tissue and fascia mobilized then long fascial incisions made to release the anterior and lateral compartments with the Metzenbuam scissors and tips pointed away from septum and the superficial peroneal. The ED room had poor lighting but assistants able to supplement with hand held flashlight. Muscle was hemorrhagic and dusky.  We then turned our attention to the medial side where a more distal 15 cm incision was made.  I mobilized the saphenous vein and had to tie off three small branches with suture as there was no electrocautery. We then pulled the superficial compartment medially and released the deep compartment along the posteromedial edge of the tibia, extending this proximally and distally. The superficial compartment was released with Metzenbuam scissors proximally and distally in similar fashion.  Sterile gently compressive dressings were then applied from foot to ankle using two Ace wraps. An assistant was absolutely necessary as the leg had to be held to prevent movement and allow exposure and other assistants to provided supplemental light.  PROGNOSIS:  The patient will need to proceed to the OR now for hemostasis, extension of the fasciotomies with exploration of the muscle, and dressing change under anesthesia with wound vac placement in addition to closed reduction and fixator placement emergently when room is available. Patient already able  to report some relief and improvement in symptoms.Risk of infection is increased.     Doralee Albino. Carola Frost, M.D.

## 2022-07-02 NOTE — Progress Notes (Addendum)
Orthopedic Tech Progress Note Patient Details:  Ryan Lynch [birth year] [id number]  Verbal orders from Ainsley Spinner, PA-C for knee immobilizer and compartmental pressure kit needed for emergent fasciotomies performed by Dr. Marcelino Scot to pt's LLE at bedside.   Ortho Devices Type of Ortho Device: Knee Immobilizer Ortho Device/Splint Location: LLE Ortho Device/Splint Interventions: Ordered, Application   Post Interventions Patient Tolerated: Well  Corinne Goucher Jeri Modena 07/02/2022, 2:19 PM

## 2022-07-02 NOTE — Consult Note (Addendum)
Orthopaedic Trauma Service (OTS) Consultation   Patient ID: Ryan Lynch MRN: [id number] DOB/AGE: [birth year] 43 y.o.   Reason for Consult: LEFT BICONDYLAR TIBIAL PLATEAU FRACTURE Referring Physician: Estill Bamberg, MD; Gloris Manchester, MD  HPI: Ryan Lynch is an 41 y.o. male who fell down eight steps this morning sometime before 4 am. He presented to the [facility] ED at 9:30am, receiving 150 mcg of fentanyl in route. Given the complexity and severity of the bicondylar fracture, Dr. Yevette Edwards asserted this was outside his scope of practice and that it would be in the best interest of the patient to have these injuries evaluated and treated by a fellowship trained orthopaedic traumatologist. Consequently, I was consulted to provide further evaluation and management. I recommended immediate ice and compression with admission to monitor for any signs of compartment syndrome and that the patient could be transferred to [facility] for further treatment. Since admission to the [facility] ED he received 4 mg morphine at 10am, 4mg  10:53am, 1mg  dilaudid at 11:58am. On arrival to [facility] patient noted to be in severe pain and extremely diaphoretic. I was contacted within two minutes of his arrival and evaluated him at bedside within two minutes of that call. He had severe pain, pain out of proportion to passive stretch, loss of sensation, and loss of motor function. I immediately contacted the OR to proceed with emergent fasciotomy but there were no available rooms. I was instead put in touch with Dr. who was rolling down the hallway to the OR with an intubated ICU patient for repeat debridement and hip disarticulation. Consequently, bumping his surgery was not an option. We had no reasonable choice but to proceed with emergent fasciotomies in the ED. ETOH negative this am. I called for the necessary equipment and arranged for anesthesia with ED MD, Dr. [facility], who immediately mobilized. While waiting for sterile set from  the OR I prepared the area by shaving the patient and using chlorhexidene scrub then betadine paint. With the Stryker device I checked pressures in each compartment, which measured 116 anterior, 145 lateral, 142 superficial posterior, 150 deep posterior, all severely elevated.    History reviewed. No pertinent past medical history.  Past Surgical History:  Procedure Laterality Date   ANTERIOR CRUCIATE LIGAMENT REPAIR Right    SHOULDER SURGERY Bilateral     History reviewed. No pertinent family history.  Social History:  reports that he has never smoked. He does not have any smokeless tobacco history on file. He reports current alcohol use. He reports that he does not use drugs.  Allergies: No Known Allergies  Medications: Prior to Admission:  Medications Prior to Admission  Medication Sig Dispense Refill Last Dose   clonazePAM (KLONOPIN) 1 MG tablet Take 1 mg by mouth daily as needed for anxiety.   07/01/2022   ibuprofen (ADVIL) 200 MG tablet Take 600 mg by mouth daily as needed (pain).   07/02/2022   sertraline (ZOLOFT) 100 MG tablet Take 200 mg by mouth daily.   07/02/2022    Results for orders placed or performed during the hospital encounter of 07/02/22 (from the past 48 hour(s))  Comprehensive metabolic panel     Status: Abnormal   Collection Time: 07/02/22  9:33 AM  Result Value Ref Range   Sodium 135 135 - 145 mmol/L   Potassium 4.3 3.5 - 5.1 mmol/L   Chloride 101 98 - 111 mmol/L   CO2 19 (L) 22 - 32 mmol/L   Glucose, Bld  135 (H) 70 - 99 mg/dL    Comment: Glucose reference range applies only to samples taken after fasting for at least 8 hours.   BUN 8 6 - 20 mg/dL   Creatinine, Ser 1.61 0.61 - 1.24 mg/dL   Calcium 8.6 (L) 8.9 - 10.3 mg/dL   Total Protein 7.3 6.5 - 8.1 g/dL   Albumin 3.9 3.5 - 5.0 g/dL   AST 096 (H) 15 - 41 U/L   ALT 59 (H) 0 - 44 U/L   Alkaline Phosphatase 75 38 - 126 U/L   Total Bilirubin 2.0 (H) 0.3 - 1.2 mg/dL   GFR, Estimated >04 >54 mL/min     Comment: (NOTE) Calculated using the CKD-EPI Creatinine Equation (2021)    Anion gap 15 5 - 15    Comment: Performed at [facility], [address] [place], [place] [address]  CBC     Status: Abnormal   Collection Time: 07/02/22  9:33 AM  Result Value Ref Range   WBC 7.4 4.0 - 10.5 K/uL   RBC 4.03 (L) 4.22 - 5.81 MIL/uL   Hemoglobin 12.0 (L) 13.0 - 17.0 g/dL   HCT 91.4 (L) 78.2 - 95.6 %   MCV 90.1 80.0 - 100.0 fL   MCH 29.8 26.0 - 34.0 pg   MCHC 33.1 30.0 - 36.0 g/dL   RDW 21.3 08.6 - 57.8 %   Platelets 103 (L) 150 - 400 K/uL    Comment: SPECIMEN CHECKED FOR CLOTS REPEATED TO VERIFY PLATELET COUNT CONFIRMED BY SMEAR    nRBC 0.0 0.0 - 0.2 %    Comment: Performed at [facility], [address] [place], [place] [address]  Ethanol     Status: None   Collection Time: 07/02/22  9:33 AM  Result Value Ref Range   Alcohol, Ethyl (B) <10 <10 mg/dL    Comment: (NOTE) Lowest detectable limit for serum alcohol is 10 mg/dL.  For medical purposes only. Performed at [facility], [address], [place], [place] [address]   Lactic acid, plasma     Status: Abnormal   Collection Time: 07/02/22  9:33 AM  Result Value Ref Range   Lactic Acid, Venous 2.4 (HH) 0.5 - 1.9 mmol/L    Comment: CRITICAL RESULT CALLED TO, READ BACK BY AND VERIFIED WITH SAVOIE,B AT 1110 ON 07/02/22 BY LUZOLOP Performed at [facility], [address] [place], [place] [address]   Protime-INR     Status: None   Collection Time: 07/02/22  9:33 AM  Result Value Ref Range   Prothrombin Time 15.2 11.4 - 15.2 seconds   INR 1.2 0.8 - 1.2    Comment: (NOTE) INR goal varies based on device and disease states. Performed at [facility], [address] [place], [place] [address]   Type and screen [facility]     Status: None   Collection Time: 07/02/22  9:33 AM  Result Value Ref Range   ABO/RH(D) O POS     Antibody Screen NEG    Sample Expiration      07/05/2022,2359 Performed at [facility], [address] [place], [place] [address]   APTT     Status: None   Collection Time: 07/02/22  9:33 AM  Result Value Ref Range   aPTT 28 24 - 36 seconds    Comment: Performed at [facility], [address], [place], [place] [address]  I-Stat Chem 8,  ED     Status: Abnormal   Collection Time: 07/02/22  9:42 AM  Result Value Ref Range   Sodium 132 (L) 135 - 145 mmol/L   Potassium 4.4 3.5 - 5.1 mmol/L   Chloride 101 98 - 111 mmol/L   BUN 5 (L) 6 - 20 mg/dL   Creatinine, Ser 7.32 0.61 - 1.24 mg/dL   Glucose, Bld 202 (H) 70 - 99 mg/dL    Comment: Glucose reference range applies only to samples taken after fasting for at least 8 hours.   Calcium, Ion 1.04 (L) 1.15 - 1.40 mmol/L   TCO2 18 (L) 22 - 32 mmol/L   Hemoglobin 13.3 13.0 - 17.0 g/dL   HCT 54.2 70.6 - 23.7 %  Sample to Blood Bank     Status: None   Collection Time: 07/02/22  9:45 AM  Result Value Ref Range   Blood Bank Specimen SAMPLE AVAILABLE FOR TESTING    Sample Expiration      07/05/2022,2359 Performed at [facility], [address], [place], [place] [address]   Resp Panel by RT-PCR (Flu A&B, Covid) Anterior Nasal Swab     Status: None   Collection Time: 07/02/22 10:44 AM   Specimen: Anterior Nasal Swab  Result Value Ref Range   SARS Coronavirus 2 by RT PCR NEGATIVE NEGATIVE    Comment: (NOTE) SARS-CoV-2 target nucleic acids are NOT DETECTED.  The SARS-CoV-2 RNA is generally detectable in upper respiratory specimens during the acute phase of infection. The lowest concentration of SARS-CoV-2 viral copies this assay can detect is 138 copies/mL. A negative result does not preclude SARS-Cov-2 infection and should not be used as the sole basis for treatment or other patient management decisions. A negative result may occur with  improper specimen collection/handling,  submission of specimen other than nasopharyngeal swab, presence of viral mutation(s) within the areas targeted by this assay, and inadequate number of viral copies(<138 copies/mL). A negative result must be combined with clinical observations, patient history, and epidemiological information. The expected result is Negative.  Fact Sheet for Patients:  BloggerCourse.com  Fact Sheet for Healthcare Providers:  SeriousBroker.it  This test is no t yet approved or cleared by the [place] FDA and  has been authorized for detection and/or diagnosis of SARS-CoV-2 by FDA under an Emergency Use Authorization (EUA). This EUA will remain  in effect (meaning this test can be used) for the duration of the COVID-19 declaration under Section 564(b)(1) of the Act, 21 U.S.C.section 360bbb-3(b)(1), unless the authorization is terminated  or revoked sooner.       Influenza A by PCR NEGATIVE NEGATIVE   Influenza B by PCR NEGATIVE NEGATIVE    Comment: (NOTE) The Xpert Xpress SARS-CoV-2/FLU/RSV plus assay is intended as an aid in the diagnosis of influenza from Nasopharyngeal swab specimens and should not be used as a sole basis for treatment. Nasal washings and aspirates are unacceptable for Xpert Xpress SARS-CoV-2/FLU/RSV testing.  Fact Sheet for Patients: BloggerCourse.com  Fact Sheet for Healthcare Providers: SeriousBroker.it  This test is not yet approved or cleared by the [place] FDA and has been authorized for detection and/or diagnosis of SARS-CoV-2 by FDA under an Emergency Use Authorization (EUA). This EUA will remain in effect (meaning this test can be used) for the duration of the COVID-19 declaration under Section 564(b)(1) of the Act, 21 U.S.C. section 360bbb-3(b)(1), unless the authorization is terminated or revoked.  Performed at [facility], [address] [place],  [place] [address]     DG Tibia/Fibula Left  Result Date: 07/02/2022 CLINICAL DATA:  External fixation. EXAM: LEFT TIBIA AND FIBULA - 2 VIEW COMPARISON:  Preoperative imaging. FINDINGS: Four fluoroscopic spot views of the left tibia and fibula obtained in the operating room. Two pins are seen in the tibial shaft. Improved alignment of comminuted proximal tibial fracture. Fluoroscopy time 15 seconds. Dose 1.04 mGy. IMPRESSION: Intraoperative fluoroscopy during left tibia and fibula external fixation placement. Electronically Signed   By: Narda Rutherford M.D.   On: 07/02/2022 16:30   DG C-Arm 1-60 Min-No Report  Result Date: 07/02/2022 Fluoroscopy was utilized by the requesting physician.  No radiographic interpretation.   DG Tibia/Fibula Left  Result Date: 07/02/2022 CLINICAL DATA:  41 year old male status post fall down stairs. Comminuted left tibial plateau fracture. EXAM: LEFT TIBIA AND FIBULA - 2 VIEW COMPARISON:  Left tib fib CT today reported separately. FINDINGS: Comminuted and impacted tibial plateau and proximal metadiaphysis fracture as demonstrated by CT. Associated knee joint effusion. Calf soft tissue swelling. Distal left femur, patella and proximal fibula appear intact. Left ankle is detailed separately today. IMPRESSION: Comminuted and impacted left tibial plateau and proximal metadiaphysis fracture as demonstrated by CT today. Associated knee joint effusion. Electronically Signed   By: Odessa Fleming M.D.   On: 07/02/2022 10:57   DG Pelvis Portable  Result Date: 07/02/2022 CLINICAL DATA:  41 year old male status post fall down stairs. Comminuted left tibial plateau fracture. EXAM: PORTABLE PELVIS 1-2 VIEWS COMPARISON:  CT Chest, Abdomen, and Pelvis today reported separately. FINDINGS: Portable AP view at 0949 hours. Femoral heads are normally located. Intact pelvis and proximal femurs. Symphysis and SI joints appear within normal limits. Negative visible bowel gas.  IMPRESSION: No acute fracture or dislocation identified about the pelvis. Electronically Signed   By: Odessa Fleming M.D.   On: 07/02/2022 10:56   DG Ankle Complete Left  Result Date: 07/02/2022 CLINICAL DATA:  41 year old male status post fall down stairs. Comminuted left tibial plateau fracture. EXAM: LEFT ANKLE COMPLETE - 3+ VIEW COMPARISON:  Left tib fib series and CT today. FINDINGS: Mortise joint alignment maintained. Talar dome intact. No joint effusion identified. Calcaneus appears intact. Distal tibia and fibula, talus and visible bones of the left foot appear intact. IMPRESSION: No acute fracture or dislocation identified about the left ankle. Electronically Signed   By: Odessa Fleming M.D.   On: 07/02/2022 10:54   CT CERVICAL SPINE WO CONTRAST  Result Date: 07/02/2022 CLINICAL DATA:  41 year old male status post fall down stairs. Comminuted left tibial plateau fracture. EXAM: CT CERVICAL SPINE WITHOUT CONTRAST TECHNIQUE: Multidetector CT imaging of the cervical spine was performed without intravenous contrast. Multiplanar CT image reconstructions were also generated. RADIATION DOSE REDUCTION: This exam was performed according to the departmental dose-optimization program which includes automated exposure control, adjustment of the mA and/or kV according to patient size and/or use of iterative reconstruction technique. COMPARISON:  Head CT today. FINDINGS: Alignment: Straightening of cervical lordosis. Cervicothoracic junction alignment is within normal limits. Bilateral posterior element alignment is within normal limits. Skull base and vertebrae: Osteopenia for age. Visualized skull base is intact. No atlanto-occipital dissociation. C1 and C2 appear intact and aligned. No acute osseous abnormality identified. Soft tissues and spinal canal: No prevertebral fluid or swelling. No visible canal hematoma. Negative visible noncontrast neck soft tissues. Disc levels:  Mild cervical disc bulging. Upper chest:  Negative. IMPRESSION: No acute traumatic injury identified in the cervical spine. Osteopenia for age. Electronically Signed  By: Odessa FlemingH  Hall M.D.   On: 07/02/2022 10:53   CT CHEST ABDOMEN PELVIS W CONTRAST  Result Date: 07/02/2022 CLINICAL DATA:  Status post fall down the stairs. EXAM: CT CHEST, ABDOMEN, AND PELVIS WITH CONTRAST TECHNIQUE: Multidetector CT imaging of the chest, abdomen and pelvis was performed following the standard protocol during bolus administration of intravenous contrast. RADIATION DOSE REDUCTION: This exam was performed according to the departmental dose-optimization program which includes automated exposure control, adjustment of the mA and/or kV according to patient size and/or use of iterative reconstruction technique. CONTRAST:  100mL OMNIPAQUE IOHEXOL 300 MG/ML  SOLN COMPARISON:  None Available. FINDINGS: CT CHEST FINDINGS Cardiovascular: No significant vascular findings. Normal heart size. No pericardial effusion. Mediastinum/Nodes: No enlarged mediastinal, hilar, or axillary lymph nodes. Thyroid gland, trachea, and esophagus demonstrate no significant findings. Lungs/Pleura: Lungs are clear. No pleural effusion or pneumothorax. Musculoskeletal: T7 mild anterior vertebral body compression fracture with approximately 20% height loss. No aggressive osseous lesion. CT ABDOMEN PELVIS FINDINGS Hepatobiliary: No focal liver abnormality is seen. Diffuse low attenuation of the liver as can be seen with hepatic steatosis. Decompressed gallbladder with a gallstone in the fundus. No biliary ductal dilatation. Pancreas: Unremarkable. No pancreatic ductal dilatation or surrounding inflammatory changes. Spleen: Mild splenomegaly without focal abnormality measuring 13 x 13.5 x 7.9 cm. Adrenals/Urinary Tract: 16 mm indeterminate left adrenal hypodense mass. Indeterminate 10 mm right adrenal gland mass. Kidneys are normal, without renal calculi, focal lesion, or hydronephrosis. Bladder is unremarkable.  Stomach/Bowel: Stomach is within normal limits. No evidence of bowel wall thickening, distention, or inflammatory changes. Appendix is normal. Vascular/Lymphatic: No significant vascular findings are present. No enlarged abdominal or pelvic lymph nodes. Reproductive: Prostate is unremarkable. Other: No abdominal wall hernia or abnormality. No abdominopelvic ascites. Musculoskeletal: No acute osseous abnormality. No aggressive osseous lesion. IMPRESSION: 1. T7 mild anterior vertebral body compression fracture with approximately 20% height loss. 2. Otherwise, no acute traumatic injury to the chest, abdomen or pelvis. 3. Hepatic steatosis. 4. Cholelithiasis without evidence of cholecystitis. 5. Indeterminate bilateral adrenal gland nodules. Recommend further characterization with a non emergent CT or MRI of the adrenal glands. Electronically Signed   By: Elige KoHetal  Patel M.D.   On: 07/02/2022 10:51   CT HEAD WO CONTRAST  Result Date: 07/02/2022 CLINICAL DATA:  41 year old male status post fall down stairs. Comminuted left tibial plateau fracture. EXAM: CT HEAD WITHOUT CONTRAST TECHNIQUE: Contiguous axial images were obtained from the base of the skull through the vertex without intravenous contrast. RADIATION DOSE REDUCTION: This exam was performed according to the departmental dose-optimization program which includes automated exposure control, adjustment of the mA and/or kV according to patient size and/or use of iterative reconstruction technique. COMPARISON:  None Available. FINDINGS: Brain: Suggestion of generalized cerebral volume loss for age. No midline shift, ventriculomegaly, mass effect, evidence of mass lesion, intracranial hemorrhage or evidence of cortically based acute infarction. Gray-white matter differentiation is within normal limits throughout the brain. Vascular: Mild Calcified atherosclerosis at the skull base. No suspicious intracranial vascular hyperdensity. Skull: No skull fracture identified.  Sinuses/Orbits: Visualized paranasal sinuses and mastoids are clear. Other: Broad-based right lateral convexity scalp hematoma. No scalp soft tissue gas. Visualized orbit soft tissues are within normal limits. IMPRESSION: 1. Broad-based right scalp hematoma without underlying skull fracture. 2. Negative noncontrast CT appearance of the brain. Electronically Signed   By: Odessa FlemingH  Hall M.D.   On: 07/02/2022 10:51   DG Chest Port 1 View  Result Date: 07/02/2022 CLINICAL DATA:  41 year old  male status post fall down stairs. Comminuted left tibial plateau fracture. EXAM: PORTABLE CHEST 1 VIEW COMPARISON:  None Available. FINDINGS: Portable AP supine view at 0945 hours. Normal lung volumes and mediastinal contours. Visualized tracheal air column is within normal limits. Allowing for portable technique the lungs are clear. No pneumothorax or pleural effusion identified on this supine view. No osseous abnormality identified. Negative visible bowel gas. IMPRESSION: Negative portable chest. Electronically Signed   By: Odessa Fleming M.D.   On: 07/02/2022 10:49   CT Tibia Fibula Left Wo Contrast  Result Date: 07/02/2022 CLINICAL DATA:  Mechanical fall 6 hours ago. Deformity of the left leg. EXAM: CT OF THE LOWER LEFT EXTREMITY WITHOUT CONTRAST TECHNIQUE: Multidetector CT imaging of the lower left extremity was performed according to the standard protocol. RADIATION DOSE REDUCTION: This exam was performed according to the departmental dose-optimization program which includes automated exposure control, adjustment of the mA and/or kV according to patient size and/or use of iterative reconstruction technique. COMPARISON:  None Available. FINDINGS: Bones/Joint/Cartilage Severely comminuted fracture of the lateral tibial plateau with 2 cm of maximum depression of the articular surface with the depressed area measuring approximately 3 x 3.6 cm. Fracture involves the medial and lateral tibial eminence. Nondisplaced intra-articular  fracture of the posterior medial tibial plateau without significant depression. Transverse fracture through the proximal tibial metaphysis without significant displacement. No other fracture or dislocation.  Large hemarthrosis. Ligaments Ligaments are suboptimally evaluated by CT. Muscles and Tendons No significant muscle atrophy. Mild perifascial edema deep to the lateral gastrocnemius muscle with poor definition of the soleus muscle as can be seen with muscle edema secondary to direct trauma or myositis. Soft tissue No fluid collection or hematoma.  No soft tissue mass. IMPRESSION: 1. Severely comminuted fracture of the lateral tibial plateau with 2 cm of maximum depression of the articular surface with the depressed area measuring approximately 3 x 3.6 cm. Fracture involves the medial and lateral tibial eminence. 2. Nondisplaced intra-articular fracture of the posterior medial tibial plateau without significant depression. 3. Transverse fracture through the proximal tibial metaphysis without significant displacement. 4. Large hemarthrosis. 5. Mild perifascial edema deep to the lateral gastrocnemius muscle with poor definition of the soleus muscle as can be seen with muscle edema secondary to direct trauma or myositis. These results were called by telephone at the time of interpretation on 07/02/2022 at 10:44 am to provider Vivien Rossetti , who verbally acknowledged these results. Electronically Signed   By: Elige Ko M.D.   On: 07/02/2022 10:45    Intake/Output      08/18 0701 08/19 0700 08/19 0701 08/20 0700   I.V. (mL/kg)  1000 (11.6)   Total Intake(mL/kg)  1000 (11.6)   Blood  100   Total Output  100   Net  +900           ROS Denies recent fever, bleeding abnormalities, urologic dysfunction, GI problems, or weight gain; however, patient is questionable historian at this time.  Blood pressure 110/76, pulse (!) 104, temperature (!) 97 F (36.1 C), resp. rate 13, weight 86.2 kg, SpO2 96  %. Physical Exam  Very diaphoretic/ sweating profusely, obvious distress, pale. Tachypneic Tachycardic Abdominal distension, obese Multiple skin plaques from psoriasis with assoc nail changes Asterixis of both lower and upper extremities LLE Blue foam splint, no ACE  Tight painful calf all four compartments  Sens: DPN, SPN, TN all diminished   Motor: EHL, FHL, and lessor toe ext and flex all without voluntary motion  Cool, pale foot  Severe pain with passive stretch  Assessment/Plan:  Compartment syndrome, advanced and severe Chronic alcohol abuse Complex left bicondylar tibial plateau fracture  Plan to proceed with immediate fasciotomies in the ED as no room in the OR at this time. Will proceed to the OR for vac and ex fix as soon as room available. Suspect ETOH abuse with liver manifestations   Myrene Galas, MD [facility], [facility] [phone]  07/02/2022, 5:02 PM  [facility] [address] [place] [address] [phone] [phone] (F)    After 5pm and on the weekends please log on to Amion, go to orthopaedics and the look under the [facility] Call for the provider(s) on call. You can also call our office at [phone] and then follow the prompts to be connected to the call team.

## 2022-07-02 NOTE — Anesthesia Preprocedure Evaluation (Signed)
Anesthesia Evaluation  Patient identified by MRN, date of birth, ID band Patient awake    Reviewed: Allergy & Precautions, NPO status , Patient's Chart, lab work & pertinent test resultsPreop documentation limited or incomplete due to emergent nature of procedure.  History of Anesthesia Complications Negative for: history of anesthetic complications  Airway Mallampati: IV  TM Distance: >3 FB Neck ROM: Full  Mouth opening: Limited Mouth Opening  Dental  (+) Dental Advisory Given, Poor Dentition   Pulmonary neg pulmonary ROS,    breath sounds clear to auscultation       Cardiovascular negative cardio ROS   Rate:Tachycardia     Neuro/Psych negative neurological ROS  negative psych ROS   GI/Hepatic negative GI ROS, Neg liver ROS,   Endo/Other    Renal/GU      Musculoskeletal Left tibial fx s/p compartment syndrome s/p emergent fasciotomy in ED   Abdominal   Peds  Hematology Lab Results      Component                Value               Date                      WBC                      7.4                 07/02/2022                HGB                      13.3                07/02/2022                HCT                      39.0                07/02/2022                MCV                      90.1                07/02/2022                PLT                      103 (L)             07/02/2022              Anesthesia Other Findings psoriasis  Reproductive/Obstetrics                             Anesthesia Physical Anesthesia Plan  ASA: 3 and emergent  Anesthesia Plan: General   Post-op Pain Management: Toradol IV (intra-op)* and Ofirmev IV (intra-op)*   Induction: Intravenous and Rapid sequence  PONV Risk Score and Plan: 2 and Ondansetron and Dexamethasone  Airway Management Planned: Oral ETT  Additional Equipment: None  Intra-op Plan:   Post-operative Plan: Extubation in  OR  Informed Consent: I have reviewed the patients History and Physical, chart, labs and discussed  the procedure including the risks, benefits and alternatives for the proposed anesthesia with the patient or authorized representative who has indicated his/her understanding and acceptance.     Dental advisory given  Plan Discussed with: CRNA  Anesthesia Plan Comments:         Anesthesia Quick Evaluation

## 2022-07-02 NOTE — Progress Notes (Signed)
I was notified regarding Ryan Lynch left tibial plateau fracture.  As previously documented, he fell down 8 steps at about 3 AM. I have reviewed images and CAT scan.  Clearly, this is a comminuted, bicondylar tibial plateau fracture, which will require surgical intervention.  I did speak with our orthopedic trauma specialist, Dr. Myrene Galas regarding this injury.  He did recommend that the patient be transferred to [facility] for icing and compression of his left lower extremity.  Given the nature of the fracture, there is a high concern for the development of compartment syndrome, so the current plan is for the patient to get transferred to [facility] for close observation, ice, and compression, and likely operative intervention on Monday or Tuesday.

## 2022-07-02 NOTE — ED Provider Notes (Signed)
[facility]-EMERGENCY DEPT Provider Note   CSN: [id number] Arrival date & time: 07/02/22  0912     History  Chief Complaint  Patient presents with   Ryan Lynch is a 41 y.o. male.  With PMH of psoriasis and anxiety brought in by EMS after fall approximately 8 steps down the stairs.  Patient said around 6 in the morning he was walking down the stairs to go to his computer to work when he missed his foot and twisted his left leg and landed on the ground onto his butt.  He is unsure if he hit his head or lost consciousness but thinks he may have lost consciousness due to the pain.  He denies any preceding symptoms.  He is complaining of severe pain in his left lower leg as well as some numbness.  He is still able to wiggle his toes although with severe pain.  He denies pain elsewhere.  Has history of ACL surgery.  Not on any anticoagulation.  Denies any drug or alcohol use.   Fall       Home Medications Prior to Admission medications   Medication Sig Start Date End Date Taking? Authorizing Provider  clonazePAM (KLONOPIN) 1 MG tablet Take 1 mg by mouth daily as needed for anxiety. 06/14/22  Yes [provider]  ibuprofen (ADVIL) 200 MG tablet Take 600 mg by mouth daily as needed (pain).   Yes [provider]  sertraline (ZOLOFT) 100 MG tablet Take 200 mg by mouth daily. 06/14/22  Yes [provider]      Allergies    Patient has no known allergies.    Review of Systems   Review of Systems  Physical Exam Updated Vital Signs BP 125/80 (BP Location: Right Arm)   Pulse (!) 107   Temp 98.2 F (36.8 C) (Oral)   Resp 16   Wt 86.2 kg   SpO2 98%   BMI 27.26 kg/m  Physical Exam Constitutional: Alert and oriented.  Uncomfortable and diaphoretic but nontoxic Eyes: Conjunctivae are normal. ENT      Head: Normocephalic and nontender      Nose: No congestion.      Mouth/Throat: Mucous membranes are moist.      Neck: No  stridor. Cardiovascular: S1, S2, tachycardic, regular rhythm, palpable bilateral femoral pulses and DP pulses Respiratory: Normal respiratory effort. Breath sounds are normal. Gastrointestinal: Soft and nontender.  Musculoskeletal: Normal range of motion in all extremities.      Right lower leg: No tenderness or edema.      Left lower leg: Tight and tender compartments diffusely of the left tibia/fibula with significant tenderness to palpation, able to wiggle toes, reported decreased sensation Neurologic: Full strength bilateral upper extremities, normal speech and language, wiggling toes bilaterally, reported decrease sensation of left lower extremity Skin: Scattered psoriasis rash Psychiatric: Mood and affect are normal. Speech and behavior are normal.  ED Results / Procedures / Treatments   Labs (all labs ordered are listed, but only abnormal results are displayed) Labs Reviewed  COMPREHENSIVE METABOLIC PANEL - Abnormal; Notable for the following components:      Result Value   CO2 19 (*)    Glucose, Bld 135 (*)    Calcium 8.6 (*)    AST 188 (*)    ALT 59 (*)    Total Bilirubin 2.0 (*)    All other components within normal limits  CBC - Abnormal; Notable for the following components:  RBC 4.03 (*)    Hemoglobin 12.0 (*)    HCT 36.3 (*)    Platelets 103 (*)    All other components within normal limits  LACTIC ACID, PLASMA - Abnormal; Notable for the following components:   Lactic Acid, Venous 2.4 (*)    All other components within normal limits  I-STAT CHEM 8, ED - Abnormal; Notable for the following components:   Sodium 132 (*)    BUN 5 (*)    Glucose, Bld 133 (*)    Calcium, Ion 1.04 (*)    TCO2 18 (*)    All other components within normal limits  RESP PANEL BY RT-PCR (FLU A&B, COVID) ARPGX2  ETHANOL  PROTIME-INR  APTT  URINALYSIS, ROUTINE W REFLEX MICROSCOPIC  DRUG SCREEN 10 W/CONF, SERUM  SAMPLE TO BLOOD BANK  TYPE AND SCREEN  ABO/RH     EKG None  Radiology DG C-Arm 1-60 Min-No Report  Result Date: 07/02/2022 Fluoroscopy was utilized by the requesting physician.  No radiographic interpretation.   DG Tibia/Fibula Left  Result Date: 07/02/2022 CLINICAL DATA:  41 year old male status post fall down stairs. Comminuted left tibial plateau fracture. EXAM: LEFT TIBIA AND FIBULA - 2 VIEW COMPARISON:  Left tib fib CT today reported separately. FINDINGS: Comminuted and impacted tibial plateau and proximal metadiaphysis fracture as demonstrated by CT. Associated knee joint effusion. Calf soft tissue swelling. Distal left femur, patella and proximal fibula appear intact. Left ankle is detailed separately today. IMPRESSION: Comminuted and impacted left tibial plateau and proximal metadiaphysis fracture as demonstrated by CT today. Associated knee joint effusion. Electronically Signed   By: Odessa FlemingH  Hall M.D.   On: 07/02/2022 10:57   DG Pelvis Portable  Result Date: 07/02/2022 CLINICAL DATA:  41 year old male status post fall down stairs. Comminuted left tibial plateau fracture. EXAM: PORTABLE PELVIS 1-2 VIEWS COMPARISON:  CT Chest, Abdomen, and Pelvis today reported separately. FINDINGS: Portable AP view at 0949 hours. Femoral heads are normally located. Intact pelvis and proximal femurs. Symphysis and SI joints appear within normal limits. Negative visible bowel gas. IMPRESSION: No acute fracture or dislocation identified about the pelvis. Electronically Signed   By: Odessa FlemingH  Hall M.D.   On: 07/02/2022 10:56   DG Ankle Complete Left  Result Date: 07/02/2022 CLINICAL DATA:  41 year old male status post fall down stairs. Comminuted left tibial plateau fracture. EXAM: LEFT ANKLE COMPLETE - 3+ VIEW COMPARISON:  Left tib fib series and CT today. FINDINGS: Mortise joint alignment maintained. Talar dome intact. No joint effusion identified. Calcaneus appears intact. Distal tibia and fibula, talus and visible bones of the left foot appear intact.  IMPRESSION: No acute fracture or dislocation identified about the left ankle. Electronically Signed   By: Odessa FlemingH  Hall M.D.   On: 07/02/2022 10:54   CT CERVICAL SPINE WO CONTRAST  Result Date: 07/02/2022 CLINICAL DATA:  41 year old male status post fall down stairs. Comminuted left tibial plateau fracture. EXAM: CT CERVICAL SPINE WITHOUT CONTRAST TECHNIQUE: Multidetector CT imaging of the cervical spine was performed without intravenous contrast. Multiplanar CT image reconstructions were also generated. RADIATION DOSE REDUCTION: This exam was performed according to the departmental dose-optimization program which includes automated exposure control, adjustment of the mA and/or kV according to patient size and/or use of iterative reconstruction technique. COMPARISON:  Head CT today. FINDINGS: Alignment: Straightening of cervical lordosis. Cervicothoracic junction alignment is within normal limits. Bilateral posterior element alignment is within normal limits. Skull base and vertebrae: Osteopenia for age. Visualized skull base is intact. No  atlanto-occipital dissociation. C1 and C2 appear intact and aligned. No acute osseous abnormality identified. Soft tissues and spinal canal: No prevertebral fluid or swelling. No visible canal hematoma. Negative visible noncontrast neck soft tissues. Disc levels:  Mild cervical disc bulging. Upper chest: Negative. IMPRESSION: No acute traumatic injury identified in the cervical spine. Osteopenia for age. Electronically Signed   By: Odessa Fleming M.D.   On: 07/02/2022 10:53   CT CHEST ABDOMEN PELVIS W CONTRAST  Result Date: 07/02/2022 CLINICAL DATA:  Status post fall down the stairs. EXAM: CT CHEST, ABDOMEN, AND PELVIS WITH CONTRAST TECHNIQUE: Multidetector CT imaging of the chest, abdomen and pelvis was performed following the standard protocol during bolus administration of intravenous contrast. RADIATION DOSE REDUCTION: This exam was performed according to the departmental  dose-optimization program which includes automated exposure control, adjustment of the mA and/or kV according to patient size and/or use of iterative reconstruction technique. CONTRAST:  OMNIPAQUE IOHEXOL 300 MG/ML  SOLN COMPARISON:  None Available. FINDINGS: CT CHEST FINDINGS Cardiovascular: No significant vascular findings. Normal heart size. No pericardial effusion. Mediastinum/Nodes: No enlarged mediastinal, hilar, or axillary lymph nodes. Thyroid gland, trachea, and esophagus demonstrate no significant findings. Lungs/Pleura: Lungs are clear. No pleural effusion or pneumothorax. Musculoskeletal: T7 mild anterior vertebral body compression fracture with approximately 20% height loss. No aggressive osseous lesion. CT ABDOMEN PELVIS FINDINGS Hepatobiliary: No focal liver abnormality is seen. Diffuse low attenuation of the liver as can be seen with hepatic steatosis. Decompressed gallbladder with a gallstone in the fundus. No biliary ductal dilatation. Pancreas: Unremarkable. No pancreatic ductal dilatation or surrounding inflammatory changes. Spleen: Mild splenomegaly without focal abnormality measuring 13 x 13.5 x 7.9 cm. Adrenals/Urinary Tract: 16 mm indeterminate left adrenal hypodense mass. Indeterminate 10 mm right adrenal gland mass. Kidneys are normal, without renal calculi, focal lesion, or hydronephrosis. Bladder is unremarkable. Stomach/Bowel: Stomach is within normal limits. No evidence of bowel wall thickening, distention, or inflammatory changes. Appendix is normal. Vascular/Lymphatic: No significant vascular findings are present. No enlarged abdominal or pelvic lymph nodes. Reproductive: Prostate is unremarkable. Other: No abdominal wall hernia or abnormality. No abdominopelvic ascites. Musculoskeletal: No acute osseous abnormality. No aggressive osseous lesion. IMPRESSION: 1. T7 mild anterior vertebral body compression fracture with approximately 20% height loss. 2. Otherwise, no acute  traumatic injury to the chest, abdomen or pelvis. 3. Hepatic steatosis. 4. Cholelithiasis without evidence of cholecystitis. 5. Indeterminate bilateral adrenal gland nodules. Recommend further characterization with a non emergent CT or MRI of the adrenal glands. Electronically Signed   By: Elige Ko M.D.   On: 07/02/2022 10:51   CT HEAD WO CONTRAST  Result Date: 07/02/2022 CLINICAL DATA:  41 year old male status post fall down stairs. Comminuted left tibial plateau fracture. EXAM: CT HEAD WITHOUT CONTRAST TECHNIQUE: Contiguous axial images were obtained from the base of the skull through the vertex without intravenous contrast. RADIATION DOSE REDUCTION: This exam was performed according to the departmental dose-optimization program which includes automated exposure control, adjustment of the mA and/or kV according to patient size and/or use of iterative reconstruction technique. COMPARISON:  None Available. FINDINGS: Brain: Suggestion of generalized cerebral volume loss for age. No midline shift, ventriculomegaly, mass effect, evidence of mass lesion, intracranial hemorrhage or evidence of cortically based acute infarction. Gray-white matter differentiation is within normal limits throughout the brain. Vascular: Mild Calcified atherosclerosis at the skull base. No suspicious intracranial vascular hyperdensity. Skull: No skull fracture identified. Sinuses/Orbits: Visualized paranasal sinuses and mastoids are clear. Other: Broad-based right lateral convexity scalp  hematoma. No scalp soft tissue gas. Visualized orbit soft tissues are within normal limits. IMPRESSION: 1. Broad-based right scalp hematoma without underlying skull fracture. 2. Negative noncontrast CT appearance of the brain. Electronically Signed   By: Odessa Fleming M.D.   On: 07/02/2022 10:51   DG Chest Port 1 View  Result Date: 07/02/2022 CLINICAL DATA:  41 year old male status post fall down stairs. Comminuted left tibial plateau fracture. EXAM:  PORTABLE CHEST 1 VIEW COMPARISON:  None Available. FINDINGS: Portable AP supine view at 0945 hours. Normal lung volumes and mediastinal contours. Visualized tracheal air column is within normal limits. Allowing for portable technique the lungs are clear. No pneumothorax or pleural effusion identified on this supine view. No osseous abnormality identified. Negative visible bowel gas. IMPRESSION: Negative portable chest. Electronically Signed   By: Odessa Fleming M.D.   On: 07/02/2022 10:49   CT Tibia Fibula Left Wo Contrast  Result Date: 07/02/2022 CLINICAL DATA:  Mechanical fall 6 hours ago. Deformity of the left leg. EXAM: CT OF THE LOWER LEFT EXTREMITY WITHOUT CONTRAST TECHNIQUE: Multidetector CT imaging of the lower left extremity was performed according to the standard protocol. RADIATION DOSE REDUCTION: This exam was performed according to the departmental dose-optimization program which includes automated exposure control, adjustment of the mA and/or kV according to patient size and/or use of iterative reconstruction technique. COMPARISON:  None Available. FINDINGS: Bones/Joint/Cartilage Severely comminuted fracture of the lateral tibial plateau with 2 cm of maximum depression of the articular surface with the depressed area measuring approximately 3 x 3.6 cm. Fracture involves the medial and lateral tibial eminence. Nondisplaced intra-articular fracture of the posterior medial tibial plateau without significant depression. Transverse fracture through the proximal tibial metaphysis without significant displacement. No other fracture or dislocation.  Large hemarthrosis. Ligaments Ligaments are suboptimally evaluated by CT. Muscles and Tendons No significant muscle atrophy. Mild perifascial edema deep to the lateral gastrocnemius muscle with poor definition of the soleus muscle as can be seen with muscle edema secondary to direct trauma or myositis. Soft tissue No fluid collection or hematoma.  No soft tissue mass.  IMPRESSION: 1. Severely comminuted fracture of the lateral tibial plateau with 2 cm of maximum depression of the articular surface with the depressed area measuring approximately 3 x 3.6 cm. Fracture involves the medial and lateral tibial eminence. 2. Nondisplaced intra-articular fracture of the posterior medial tibial plateau without significant depression. 3. Transverse fracture through the proximal tibial metaphysis without significant displacement. 4. Large hemarthrosis. 5. Mild perifascial edema deep to the lateral gastrocnemius muscle with poor definition of the soleus muscle as can be seen with muscle edema secondary to direct trauma or myositis. These results were called by telephone at the time of interpretation on 07/02/2022 at 10:44 am to provider Vivien Rossetti , who verbally acknowledged these results. Electronically Signed   By: Elige Ko M.D.   On: 07/02/2022 10:45    Procedures Procedures  Remained on constant cardiac monitoring, sinus tachycardia   CRITICAL CARE Performed by: Mardene Sayer   Total critical care time: 45 minutes  Critical care time was exclusive of separately billable procedures and treating other patients.  Traumatic injury with concern for associated compartment syndrome requiring emergent orthopedic evaluation and surgery.  Critical care was necessary to treat or prevent imminent or life-threatening deterioration.  Critical care was time spent personally by me on the following activities: development of treatment plan with patient and/or surrogate as well as nursing, discussions with consultants, evaluation of patient's response to  treatment, examination of patient, obtaining history from patient or surrogate, ordering and performing treatments and interventions, ordering and review of laboratory studies, ordering and review of radiographic studies, pulse oximetry and re-evaluation of patient's condition.   Medications Ordered in ED Medications   propofol (DIPRIVAN) 10 mg/mL bolus/IV push 344.8 mg ( Intravenous MAR Hold 07/02/22 1400)  propofol (DIPRIVAN) 10 mg/mL bolus/IV push (has no administration in time range)  propofol (DIPRIVAN) 1000 MG/100ML infusion (has no administration in time range)  lactated ringers infusion ( Intravenous Rate/Dose Change 07/02/22 1556)  morphine (PF) 4 MG/ML injection 4 mg (4 mg Intravenous Given 07/02/22 1000)  ondansetron (ZOFRAN) injection 4 mg (4 mg Intravenous Given 07/02/22 1001)  lactated ringers bolus 1,000 mL (0 mLs Intravenous Stopped 07/02/22 1137)  iohexol (OMNIPAQUE) 300 MG/ML solution 100 mL (100 mLs Intravenous Contrast Given 07/02/22 1005)  morphine (PF) 4 MG/ML injection 4 mg (4 mg Intravenous Given 07/02/22 1053)  HYDROmorphone (DILAUDID) injection 1 mg (1 mg Intravenous Given 07/02/22 1158)  ondansetron (ZOFRAN) injection 4 mg ( Intravenous MAR Hold 07/02/22 1400)  chlorhexidine (PERIDEX) 0.12 % solution 15 mL (15 mLs Mouth/Throat Given 07/02/22 1410)    Or  Oral care mouth rinse ( Mouth Rinse See Alternative 07/02/22 1410)  propofol (DIPRIVAN) 10 mg/mL bolus/IV push (400 mg Intravenous Given 07/02/22 1340)  ceFAZolin (ANCEF) IVPB 1 g/50 mL premix (1 g Intravenous New Bag/Given 07/02/22 1350)  0.9 %  sodium chloride infusion (1,000 mLs Intravenous New Bag/Given 07/02/22 1420)    ED Course/ Medical Decision Making/ A&P Clinical Course as of 07/02/22 1626  Sat Jul 02, 2022  1044 Consulted Ortho for concern of bicondylar fracture and lateral tibial plateau fracture with large hemarthrosis and exam concerning for compartment syndrome. [VB]  1106 Patient's pan scan notable for tibial plateau fracture, T7 compression fracture and scalp hematoma, no ICH.  Clinically cleared patient C-spine.  Spoke with Dumonski of orthopedics who request ED to ED transfer and patient will be accepted under Dr. Rudean Haskell service.Ed to Ed accepted by Gloris Manchester MD [VB]    Clinical Course User Index [VB] Mardene Sayer, MD                           Medical Decision Making  Ryan Lynch is a 41 y.o. male.  With PMH of psoriasis and anxiety brought in by EMS after fall approximately 8 steps down the stairs.   Patient's primary trauma survey was intact.  Secondary trauma survey noted for tight compartments of the left tibia/fibula with significant tenderness to palpation diffusely.  Palpable DP pulses on the left lower extremity able to wiggle toes on left lower extremity with reported decrease sensation of the left lower extremity distal to left knee.  Initial chest x-ray unremarkable no pneumothorax or traumatic injury on personal interpretation.  Pelvis x-ray on interpretation negative for acute traumatic injury.  Due to fall and traumatic injury, pan scan obtained as well as trauma labs which were noted to have a T7 compression fracture and left tibial plateau fracture with large hemarthrosis and exam concerning for possible compartment syndrome.  Spoke with Dumonski of orthopedics who excepted patient under Dr. Carola Frost for admission to orthopedics after ED to ED transfer which was excepted by Dr. Gloris Manchester to [facility].  Amount and/or Complexity of Data Reviewed Labs: ordered. Decision-making details documented in ED Course. Radiology: ordered and independent interpretation performed.    Details: CT head with no  ICH on interpretation  Risk Prescription drug management.   Final Clinical Impression(s) / ED Diagnoses Final diagnoses:  Fall, initial encounter  Closed fracture of left tibial plateau, initial encounter  Hematoma of scalp, initial encounter  Compression fracture of T7 vertebra, initial encounter [facility])    Rx / DC Orders ED Discharge Orders     None         Mardene Sayer, MD 07/02/22 1626

## 2022-07-02 NOTE — Assessment & Plan Note (Signed)
Bilateral with a left 16 mm indeterminate left adrenal hypodense mass and a right 10 mm indeterminate right adrenal gland mass. Recommendations for outpatient non-emergent CT or MRI of the adrenal glands.

## 2022-07-02 NOTE — Anesthesia Procedure Notes (Signed)
Procedure Name: Intubation Date/Time: 07/02/2022 2:39 PM  Performed by: Betha Loa, CRNAPre-anesthesia Checklist: Patient identified, Emergency Drugs available, Suction available and Patient being monitored Patient Re-evaluated:Patient Re-evaluated prior to induction Oxygen Delivery Method: Circle System Utilized Preoxygenation: Pre-oxygenation with 100% oxygen Induction Type: IV induction Ventilation: Mask ventilation without difficulty Laryngoscope Size: Mac and 4 Grade View: Grade I Tube type: Oral Tube size: 7.5 mm Number of attempts: 1 Airway Equipment and Method: Stylet and Oral airway Placement Confirmation: ETT inserted through vocal cords under direct vision, positive ETCO2 and breath sounds checked- equal and bilateral Secured at: 23 cm Tube secured with: Tape Dental Injury: Teeth and Oropharynx as per pre-operative assessment

## 2022-07-02 NOTE — ED Triage Notes (Addendum)
Pt arrived via EMS, from home, mechanical fall down 8 carpeted steps approx 6 hrs ago. Abrasions to forehead. Swelling and redness to left leg, below the knee,sensation lessened on left leg, below the knee, area feels taught. Pedal pulses palpable on left side. Marked. Pt aox4, denies any LOC during fall. Given 150 mcg fentanyl enroute

## 2022-07-03 ENCOUNTER — Other Ambulatory Visit: Payer: Self-pay

## 2022-07-03 DIAGNOSIS — E278 Other specified disorders of adrenal gland: Secondary | ICD-10-CM

## 2022-07-03 DIAGNOSIS — T796XXA Traumatic ischemia of muscle, initial encounter: Secondary | ICD-10-CM

## 2022-07-03 DIAGNOSIS — D696 Thrombocytopenia, unspecified: Secondary | ICD-10-CM | POA: Diagnosis not present

## 2022-07-03 DIAGNOSIS — Z789 Other specified health status: Secondary | ICD-10-CM

## 2022-07-03 DIAGNOSIS — F419 Anxiety disorder, unspecified: Secondary | ICD-10-CM

## 2022-07-03 DIAGNOSIS — R7401 Elevation of levels of liver transaminase levels: Secondary | ICD-10-CM

## 2022-07-03 LAB — CBC
HCT: 24 % — ABNORMAL LOW (ref 39.0–52.0)
Hemoglobin: 8 g/dL — ABNORMAL LOW (ref 13.0–17.0)
MCH: 30.2 pg (ref 26.0–34.0)
MCHC: 33.3 g/dL (ref 30.0–36.0)
MCV: 90.6 fL (ref 80.0–100.0)
Platelets: 72 10*3/uL — ABNORMAL LOW (ref 150–400)
RBC: 2.65 MIL/uL — ABNORMAL LOW (ref 4.22–5.81)
RDW: 15.2 % (ref 11.5–15.5)
WBC: 5.1 10*3/uL (ref 4.0–10.5)
nRBC: 0 % (ref 0.0–0.2)

## 2022-07-03 LAB — COMPREHENSIVE METABOLIC PANEL
ALT: 41 U/L (ref 0–44)
AST: 133 U/L — ABNORMAL HIGH (ref 15–41)
Albumin: 2.7 g/dL — ABNORMAL LOW (ref 3.5–5.0)
Alkaline Phosphatase: 57 U/L (ref 38–126)
Anion gap: 8 (ref 5–15)
BUN: 9 mg/dL (ref 6–20)
CO2: 21 mmol/L — ABNORMAL LOW (ref 22–32)
Calcium: 7.7 mg/dL — ABNORMAL LOW (ref 8.9–10.3)
Chloride: 106 mmol/L (ref 98–111)
Creatinine, Ser: 0.74 mg/dL (ref 0.61–1.24)
GFR, Estimated: >60 mL/min (ref >60)
Glucose, Bld: 125 mg/dL — ABNORMAL HIGH (ref 70–99)
Potassium: 4.3 mmol/L (ref 3.5–5.1)
Sodium: 135 mmol/L (ref 135–145)
Total Bilirubin: 2.1 mg/dL — ABNORMAL HIGH (ref 0.3–1.2)
Total Protein: 5.2 g/dL — ABNORMAL LOW (ref 6.5–8.1)

## 2022-07-03 LAB — VITAMIN D 25 HYDROXY (VIT D DEFICIENCY, FRACTURES): Vit D, 25-Hydroxy: 12.19 ng/mL — ABNORMAL LOW (ref 30–100)

## 2022-07-03 LAB — CK: Total CK: 975 U/L — ABNORMAL HIGH (ref 49–397)

## 2022-07-03 LAB — LACTIC ACID, PLASMA: Lactic Acid, Venous: 1 mmol/L (ref 0.5–1.9)

## 2022-07-03 MED ORDER — ZINC SULFATE 220 (50 ZN) MG PO CAPS
220.0000 mg | ORAL_CAPSULE | Freq: Every day | ORAL | Status: DC
  Administered 2022-07-03 – 2022-07-08 (×3): 220 mg via ORAL
  Filled 2022-07-03 (×5): qty 1

## 2022-07-03 MED ORDER — ASCORBIC ACID 500 MG PO TABS: 1000.0000 mg | ORAL_TABLET | Freq: Every day | ORAL | Status: DC

## 2022-07-03 MED ORDER — CHLORDIAZEPOXIDE HCL 5 MG PO CAPS
25.0000 mg | ORAL_CAPSULE | Freq: Once | ORAL | Status: AC
  Administered 2022-07-03: 25 mg via ORAL
  Filled 2022-07-03: qty 5

## 2022-07-03 MED ORDER — CHLORDIAZEPOXIDE HCL 5 MG PO CAPS: 25.0000 mg | ORAL_CAPSULE | Freq: Every day | ORAL | Status: DC

## 2022-07-03 MED ORDER — VITAMIN D 25 MCG (1000 UNIT) PO TABS
2000.0000 [IU] | ORAL_TABLET | Freq: Two times a day (BID) | ORAL | Status: DC
  Administered 2022-07-03 – 2022-07-16 (×12): 2000 [IU] via ORAL
  Filled 2022-07-03 (×15): qty 2

## 2022-07-03 MED ORDER — HYDROXYZINE HCL 25 MG PO TABS: 25.0000 mg | ORAL_TABLET | Freq: Four times a day (QID) | ORAL | Status: AC | PRN

## 2022-07-03 MED ORDER — CHLORDIAZEPOXIDE HCL 5 MG PO CAPS
25.0000 mg | ORAL_CAPSULE | Freq: Four times a day (QID) | ORAL | Status: AC
  Administered 2022-07-03 (×3): 25 mg via ORAL
  Filled 2022-07-03 (×3): qty 5

## 2022-07-03 MED ORDER — CHLORDIAZEPOXIDE HCL 5 MG PO CAPS
25.0000 mg | ORAL_CAPSULE | Freq: Three times a day (TID) | ORAL | Status: DC
  Administered 2022-07-04 (×2): 25 mg via ORAL
  Filled 2022-07-03 (×2): qty 5

## 2022-07-03 MED ORDER — CHLORDIAZEPOXIDE HCL 5 MG PO CAPS: 25.0000 mg | ORAL_CAPSULE | ORAL | Status: DC

## 2022-07-03 NOTE — Evaluation (Signed)
Occupational Therapy Evaluation Patient Details Name: Rafeal Sardinas MRN: [id number] DOB: [birth year] Today's Date: 07/03/2022   History of Present Illness 41 yo male admitted following fall down the stairs with subsequent tibial plateua fx requiring 4 compartment fasciotomies and placement of ex fix. PMH includes anxiety, ACL repair, psoriasis and daily consumption of alcohol.   Clinical Impression   41 yo male previous indep with ADLs and mobility with PMH as detailed above. Per chart pt drinks alcohol daily and was put on CIWA protocl. He is extremely anxious throughout session with difficulty consistently following commands given level of agitation. He is redirectable with repeated explanation and demonstration. 2 person assist utilized for safety given impulsivity and line management. Pt refusing BSC and stating he will hold BM until he is safe to ambulate to std toilet. Reinforced this is not safe given elevated HR with mobility as well as instability with transfer and holding BM is not a healthy or safe choice. Pt verbalized understanding and continued to refuse. RN made aware. Left seated in bedside chair with chair alarm. Will continue to follow acutely.      Recommendations for follow up therapy are one component of a multi-disciplinary discharge planning process, led by the attending physician.  Recommendations may be updated based on patient status, additional functional criteria and insurance authorization.   Follow Up Recommendations  Other (comment) (will continue to assess based on progress, goal of progress to d/c home with [facility])    Assistance Recommended at Discharge Frequent or constant Supervision/Assistance  Patient can return home with the following A lot of help with walking and/or transfers;A lot of help with bathing/dressing/bathroom;Direct supervision/assist for medications management;Assist for transportation    Functional Status Assessment  Patient has had a recent  decline in their functional status and demonstrates the ability to make significant improvements in function in a reasonable and predictable amount of time.  Equipment Recommendations  BSC/3in1    Recommendations for Other Services Other (comment) (Psych consult)     Precautions / Restrictions Precautions Precautions: Other (comment) (ex fix, wound vac) Restrictions Weight Bearing Restrictions: Yes LLE Weight Bearing: Non weight bearing      Mobility Bed Mobility Overal bed mobility: Needs Assistance Bed Mobility: Supine to Sit     Supine to sit: Mod assist, Min assist, HOB elevated, +2 for safety/equipment     General bed mobility comments: assist for scooting hips to EOB              ADL either performed or assessed with clinical judgement   ADL Overall ADL's : Needs assistance/impaired Eating/Feeding: Modified independent   Grooming: Modified independent;Wash/dry face;Sitting   Upper Body Bathing: Min guard;Cueing for safety;Sitting   Lower Body Bathing: Moderate assistance;Sitting/lateral leans   Upper Body Dressing : Min guard;Sitting   Lower Body Dressing: Sitting/lateral leans;Moderate assistance;Cueing for safety   Toilet Transfer: BSC/3in1;Moderate assistance;+2 for safety/equipment;Cueing for safety;Cueing for sequencing   Toileting- Clothing Manipulation and Hygiene: Moderate assistance;Sit to/from stand       Functional mobility during ADLs: +2 for safety/equipment;Minimal assistance;Rolling walker (2 wheels) General ADL Comments: very preoccupied with maintaining his modesty, refused BSC transfer insisting he would like to go to toilet, educated in reason this is not safe this date and pt states he can hold it until he is able to go to standard toilet, RN made aware     Vision Baseline Vision/History: 0 No visual deficits Ability to See in Adequate Light: 0 Adequate Patient Visual Report: No change from  baseline Vision Assessment?: No  apparent visual deficits            Pertinent Vitals/Pain Pain Assessment Pain Assessment: 0-10 Pain Score: 7  Pain Location: LLE Pain Intervention(s): Repositioned, Monitored during session, Limited activity within patient's tolerance, Relaxation     Hand Dominance Right   Extremity/Trunk Assessment Upper Extremity Assessment Upper Extremity Assessment: Overall WFL for tasks assessed   Lower Extremity Assessment Lower Extremity Assessment: Defer to PT evaluation       Communication Communication Communication: No difficulties   Cognition Arousal/Alertness: Awake/alert Behavior During Therapy: Anxious, Restless Overall Cognitive Status: Impaired/Different from baseline Area of Impairment: Safety/judgement, Following commands, Attention, Awareness         Current Attention Level: Sustained   Following Commands: Follows one step commands inconsistently (easily confused and distracted with simple commands including strategies to improve balance and comfort, likely related to high anxiety) Safety/Judgement: Decreased awareness of deficits, Decreased awareness of safety Awareness: Emergent         General Comments  excessive dry skin with flaking over arms, head, neck and back due to psoriasis, pt reports he has not been managing it home due to his anxiety and depression            Home Living Family/patient expects to be discharged to:: Private residence Living Arrangements: Parent (mother, father) Available Help at Discharge: Family Type of Home: House Home Access: Stairs to enter Secretary/administrator of Steps: 3 Entrance Stairs-Rails: Right;Left Home Layout: Two level;Able to live on main level with bedroom/bathroom     Bathroom Shower/Tub: Producer, television/film/video: Standard     Home Equipment: Crutches          Prior Functioning/Environment Prior Level of Function : Independent/Modified Independent              OT Problem List:  Decreased cognition;Decreased safety awareness;Decreased knowledge of use of DME or AE;Decreased coordination;Decreased activity tolerance;Impaired balance (sitting and/or standing)      OT Treatment/Interventions: Self-care/ADL training;DME and/or AE instruction;Therapeutic activities;Cognitive remediation/compensation;Patient/family education    OT Goals(Current goals can be found in the care plan section) Acute Rehab OT Goals Patient Stated Goal: Mobilize safely to toilet without assistance, go home OT Goal Formulation: With patient Time For Goal Achievement: 07/17/22 Potential to Achieve Goals: Good ADL Goals Pt Will Perform Lower Body Bathing: with set-up;with adaptive equipment Pt Will Perform Lower Body Dressing: with set-up;with adaptive equipment Pt Will Transfer to Toilet: with set-up;bedside commode Pt Will Perform Toileting - Clothing Manipulation and hygiene: with set-up;sit to/from stand  OT Frequency: Min 2X/week    Co-evaluation PT/OT/SLP Co-Evaluation/Treatment: Yes Reason for Co-Treatment: Necessary to address cognition/behavior during functional activity;For patient/therapist safety   OT goals addressed during session: Other (comment);ADL's and self-care (safety awareness, cognition, coping for anxiety)      AM-PAC OT "6 Clicks" Daily Activity     Outcome Measure Help from another person eating meals?: None Help from another person taking care of personal grooming?: A Little Help from another person toileting, which includes using toliet, bedpan, or urinal?: A Lot Help from another person bathing (including washing, rinsing, drying)?: A Lot Help from another person to put on and taking off regular upper body clothing?: A Little Help from another person to put on and taking off regular lower body clothing?: A Lot 6 Click Score: 16   End of Session Equipment Utilized During Treatment: Gait belt;Rolling walker (2 wheels);Oxygen Nurse Communication: Mobility  status;Other (comment) (concerns for wound vac  canister being full and pt poor safety awareness)  Activity Tolerance: Patient limited by pain;Other (comment) (increased HR with mobility as high as 150) Patient left: in chair;with chair alarm set;with call bell/phone within reach  OT Visit Diagnosis: Unsteadiness on feet (R26.81);Other abnormalities of gait and mobility (R26.89);Other symptoms and signs involving cognitive function                Time: 3664-4034 OT Time Calculation (min): 38 min Charges:  OT General Charges $OT Visit: 1 Visit OT Evaluation $OT Eval Moderate Complexity: 1 Mod OT Treatments $Self Care/Home Management : 8-22 mins    Jasmine Pang Jakala Herford, OTR/L 07/03/2022, 1:16 PM

## 2022-07-03 NOTE — Hospital Course (Addendum)
Ryan Lynch is [name] 41 y.o. male with Shimshon Narula history of alcohol use and anxiety. Patient presented secondary to Lovell Nuttall fall down his stairs and subsequent worsening pain. Upon arrival to the ED, he was found to have evidence of Drako Maese left tibial fracture and evidence of compartment syndrome, requiring emergent fasciotomy.   Hospital course has been c/b alcohol withdrawal and delirium.  Also c/b ileus requiring NG tube placement, general surgery currently following.  Pt with PICC for TPN.  Orthopedic surgeries completed at this time.    See below for additional details

## 2022-07-03 NOTE — Plan of Care (Signed)

## 2022-07-03 NOTE — Plan of Care (Signed)

## 2022-07-03 NOTE — Assessment & Plan Note (Signed)
Secondary to fall down the stairs. Resultant compartment syndrome. CK of 1,513, down to 975 today.

## 2022-07-03 NOTE — Evaluation (Signed)
Physical Therapy Evaluation Patient Details Name: Ryan Lynch MRN: [id number] DOB: 10-08-1981 Today's Date: 07/03/2022  History of Present Illness  41 yo male admitted following fall down the stairs with subsequent tibial plateua fx requiring 4 compartment fasciotomies and placement of ex fix. PMH includes anxiety, ACL repair, psoriasis and daily consumption of alcohol.  Clinical Impression  PTA, pt lives with his parents and is independent. Pt presents with fair pain control. Requiring two person minimal assist for bed mobility and transfers to recliner; demonstrates good adherence to weightbearing precautions. HR 122-140 bpm, SpO2 94% on 2L O2, BP 135/69 (81). Pt demonstrates decreased functional mobility secondary to weightbearing precautions and ex fix placement, decreased activity tolerance, and impaired standing balance. Suspect steady progress given resolution of withdrawal symptoms. Will continue to progress as tolerated.     Recommendations for follow up therapy are one component of a multi-disciplinary discharge planning process, led by the attending physician.  Recommendations may be updated based on patient status, additional functional criteria and insurance authorization.  Follow Up Recommendations Home health PT (pending progress)      Assistance Recommended at Discharge Frequent or constant Supervision/Assistance  Patient can return home with the following  A little help with walking and/or transfers;A little help with bathing/dressing/bathroom;Assistance with cooking/housework;Assist for transportation;Help with stairs or ramp for entrance    Equipment Recommendations Rolling walker (2 wheels);BSC/3in1;Wheelchair (measurements PT);Wheelchair cushion (measurements PT)  Recommendations for Other Services       Functional Status Assessment Patient has had a recent decline in their functional status and demonstrates the ability to make significant improvements in function in a  reasonable and predictable amount of time.     Precautions / Restrictions Precautions Precautions: Fall;Other (comment) Precaution Comments: ex fix, wound vac, watch HR/O2 Restrictions Weight Bearing Restrictions: Yes LLE Weight Bearing: Non weight bearing      Mobility  Bed Mobility Overal bed mobility: Needs Assistance Bed Mobility: Supine to Sit     Supine to sit: Min assist, HOB elevated, +2 for safety/equipment     General bed mobility comments: assist for scooting hips to EOB and for LLE management    Transfers Overall transfer level: Needs assistance Equipment used: Rolling walker (2 wheels) Transfers: Sit to/from Stand, Bed to chair/wheelchair/BSC Sit to Stand: Min assist, +2 safety/equipment Stand pivot transfers: Min assist, +2 safety/equipment         General transfer comment: MinA to rise and steady from edge of bed; pivoting towards right to chair. Assist to steady    Ambulation/Gait                  Stairs            Wheelchair Mobility    Modified Rankin (Stroke Patients Only)       Balance Overall balance assessment: Needs assistance Sitting-balance support: Feet unsupported Sitting balance-Leahy Scale: Fair     Standing balance support: Bilateral upper extremity supported Standing balance-Leahy Scale: Poor Standing balance comment: reliant on RW                             Pertinent Vitals/Pain Pain Assessment Pain Assessment: 0-10 Pain Score: 7  Pain Location: LLE Pain Intervention(s): Monitored during session    Home Living Family/patient expects to be discharged to:: Private residence Living Arrangements: Parent (mother, father) Available Help at Discharge: Family Type of Home: House Home Access: Stairs to enter Entrance Stairs-Rails: Doctor, general practice of Steps: 3  Home Layout: Two level;Able to live on main level with bedroom/bathroom Home Equipment: Crutches Additional  Comments: reports hx of anxiety/depression    Prior Function Prior Level of Function : Independent/Modified Independent                     Hand Dominance   Dominant Hand: Right    Extremity/Trunk Assessment   Upper Extremity Assessment Upper Extremity Assessment: Overall WFL for tasks assessed    Lower Extremity Assessment Lower Extremity Assessment: LLE deficits/detail LLE Deficits / Details: tibial plateau fx s/p ex fix. Able to wiggle toes and perform ankle dorsiflexion. Increased sanguineous drainage (RN aware)    Cervical / Trunk Assessment Cervical / Trunk Assessment: Normal  Communication   Communication: No difficulties  Cognition Arousal/Alertness: Awake/alert Behavior During Therapy: Anxious, Restless Overall Cognitive Status: Impaired/Different from baseline Area of Impairment: Safety/judgement, Following commands, Attention, Awareness                   Current Attention Level: Sustained   Following Commands: Follows one step commands inconsistently (easily confused and distracted with simple commands including strategies to improve balance and comfort, likely related to high anxiety) Safety/Judgement: Decreased awareness of deficits, Decreased awareness of safety Awareness: Emergent            General Comments General comments (skin integrity, edema, etc.): excessive dry skin with flaking over arms, head, neck and back due to psoriasis, pt reports he has not been managing it home due to his anxiety and depression    Exercises General Exercises - Lower Extremity Ankle Circles/Pumps: Left, 10 reps, Supine   Assessment/Plan    PT Assessment Patient needs continued PT services  PT Problem List Decreased strength;Decreased activity tolerance;Decreased balance;Decreased mobility;Cardiopulmonary status limiting activity;Pain       PT Treatment Interventions DME instruction;Gait training;Functional mobility training;Therapeutic  activities;Stair training;Therapeutic exercise;Balance training;Patient/family education;Wheelchair mobility training    PT Goals (Current goals can be found in the Care Plan section)  Acute Rehab PT Goals Patient Stated Goal: to manage anxiety PT Goal Formulation: With patient Time For Goal Achievement: 07/17/22 Potential to Achieve Goals: Good    Frequency Min 5X/week     Co-evaluation PT/OT/SLP Co-Evaluation/Treatment: Yes Reason for Co-Treatment: To address functional/ADL transfers;For patient/therapist safety PT goals addressed during session: Mobility/safety with mobility OT goals addressed during session: Other (comment);ADL's and self-care (safety awareness, cognition, coping for anxiety)       AM-PAC PT "6 Clicks" Mobility  Outcome Measure Help needed turning from your back to your side while in a flat bed without using bedrails?: A Little Help needed moving from lying on your back to sitting on the side of a flat bed without using bedrails?: A Little Help needed moving to and from a bed to a chair (including a wheelchair)?: A Little Help needed standing up from a chair using your arms (e.g., wheelchair or bedside chair)?: A Little Help needed to walk in hospital room?: A Lot Help needed climbing 3-5 steps with a railing? : Total 6 Click Score: 15    End of Session Equipment Utilized During Treatment: Gait belt;Oxygen Activity Tolerance: Patient tolerated treatment well Patient left: in chair;with call bell/phone within reach;with chair alarm set Nurse Communication: Mobility status;Other (comment) (wound vac canister full) PT Visit Diagnosis: Unsteadiness on feet (R26.81);Difficulty in walking, not elsewhere classified (R26.2);Pain Pain - Right/Left: Left Pain - part of body: Leg    Time: 1204-1239 PT Time Calculation (min) (ACUTE ONLY): 35 min  Charges:   PT Evaluation $PT Eval Moderate Complexity: 1 Mod          Lillia Pauls, PT, DPT Acute  Rehabilitation Services Office [phone]   Norval [name] 07/03/2022, 2:12 PM

## 2022-07-03 NOTE — Progress Notes (Signed)
Orthopaedic Trauma Service Progress Note  Patient ID: Ryan Lynch MRN: [id number] DOB/AGE: [birth year] 41 y.o.  Subjective:  Doing ok  Denies back pain  Pain better than pre-op in L leg No other complaints   Appreciate medicines assistance with management   Vac with good seal Expected level of drainage    ROS + tingling plantar aspect left foot   Objective:   VITALS:   Vitals:   07/03/22 0020 07/03/22 0318 07/03/22 0615 07/03/22 0755  BP: 114/69  128/74 119/67  Pulse: (!) 104 (!) 103 (!) 109 (!) 113  Resp: 13  16 16   Temp: 98.6 F (37 C)  98.6 F (37 C) 98.8 F (37.1 C)  TempSrc: Oral  Oral Oral  SpO2: 96%  96% (!) 89%  Weight:        Estimated body mass index is 27.26 kg/m as calculated from the following:   Height as of 10/29/15: 5\' 10"  (1.778 m).   Weight as of this encounter: 86.2 kg.   Intake/Output      08/19 0701 08/20 0700 08/20 0701 08/21 0700   I.V. (mL/kg) 2040 (23.7)    IV Piggyback 410    Total Intake(mL/kg) 2450 (28.4)    Urine (mL/kg/hr) 300    Drains 400    Blood 100    Total Output 800    Net +1650           LABS  Results for orders placed or performed during the hospital encounter of 07/02/22 (from the past 24 hour(s))  Ammonia     Status: Abnormal   Collection Time: 07/02/22  5:37 PM  Result Value Ref Range   Ammonia 96 (H) 9 - 35 umol/L  Amylase     Status: None   Collection Time: 07/02/22  5:37 PM  Result Value Ref Range   Amylase 61 28 - 100 U/L  Folate     Status: None   Collection Time: 07/02/22  5:37 PM  Result Value Ref Range   Folate 6.3 >5.9 ng/mL  Lipase, blood     Status: None   Collection Time: 07/02/22  5:37 PM  Result Value Ref Range   Lipase 50 11 - 51 U/L  Vitamin B12     Status: None   Collection Time: 07/02/22  5:37 PM  Result Value Ref Range   Vitamin B-12 389 180 - 914 pg/mL  CK     Status: Abnormal   Collection Time:  07/02/22  5:37 PM  Result Value Ref Range   Total CK 1,513 (H) 49 - 397 U/L  Hepatitis panel, acute     Status: None   Collection Time: 07/02/22  5:37 PM  Result Value Ref Range   Hepatitis B Surface Ag NON REACTIVE NON REACTIVE   HCV Ab NON REACTIVE NON REACTIVE   Hep A IgM NON REACTIVE NON REACTIVE   Hep B C IgM NON REACTIVE NON REACTIVE  Gamma GT     Status: Abnormal   Collection Time: 07/02/22  5:37 PM  Result Value Ref Range   GGT 1,052 (H) 7 - 50 U/L  VITAMIN D 25 Hydroxy (Vit-D Deficiency, Fractures)     Status: Abnormal   Collection Time: 07/03/22  2:51 AM  Result Value Ref Range   Vit D, 25-Hydroxy 12.19 (L) 30 -  100 ng/mL  CBC     Status: Abnormal   Collection Time: 07/03/22  2:51 AM  Result Value Ref Range   WBC 5.1 4.0 - 10.5 K/uL   RBC 2.65 (L) 4.22 - 5.81 MIL/uL   Hemoglobin 8.0 (L) 13.0 - 17.0 g/dL   HCT 40.9 (L) 81.1 - 91.4 %   MCV 90.6 80.0 - 100.0 fL   MCH 30.2 26.0 - 34.0 pg   MCHC 33.3 30.0 - 36.0 g/dL   RDW 78.2 95.6 - 21.3 %   Platelets 72 (L) 150 - 400 K/uL   nRBC 0.0 0.0 - 0.2 %  CK     Status: Abnormal   Collection Time: 07/03/22  2:51 AM  Result Value Ref Range   Total CK 975 (H) 49 - 397 U/L  Comprehensive metabolic panel     Status: Abnormal   Collection Time: 07/03/22  2:51 AM  Result Value Ref Range   Sodium 135 135 - 145 mmol/L   Potassium 4.3 3.5 - 5.1 mmol/L   Chloride 106 98 - 111 mmol/L   CO2 21 (L) 22 - 32 mmol/L   Glucose, Bld 125 (H) 70 - 99 mg/dL   BUN 9 6 - 20 mg/dL   Creatinine, Ser 0.86 0.61 - 1.24 mg/dL   Calcium 7.7 (L) 8.9 - 10.3 mg/dL   Total Protein 5.2 (L) 6.5 - 8.1 g/dL   Albumin 2.7 (L) 3.5 - 5.0 g/dL   AST 578 (H) 15 - 41 U/L   ALT 41 0 - 44 U/L   Alkaline Phosphatase 57 38 - 126 U/L   Total Bilirubin 2.1 (H) 0.3 - 1.2 mg/dL   GFR, Estimated >46 >96 mL/min   Anion gap 8 5 - 15  Lactic acid, plasma     Status: None   Collection Time: 07/03/22  2:51 AM  Result Value Ref Range   Lactic Acid, Venous 1.0 0.5 - 1.9  mmol/L     PHYSICAL EXAM:   Gen: sitting up in chair, looks more comfortable than yesterday  Lungs: unlabored Cardiac: tachy but reg Ext:       Left lower Extremity   Vac with good seal   Ex fix stable  Blood drainage from thigh pinsites (this is expected)  Tibia pinsites look good  Ext warm   Mild swelling to foot and ankle  + DP pulse  Pt has much improved motor function with ankle extension and flexion   Great toe extension intact  DPN, SPN sensation nearly normal per his report  TN sensation still diminished     Assessment/Plan: 1 Day Post-Op   Principal Problem:   Observation after surgery Active Problems:   Tibial plateau fracture, left   Alcohol use   Transaminitis   Compartment syndrome of left lower extremity, initial encounter ([name])   Adrenal nodule ([name])   Compression fracture of T7 vertebra ([name])   Thrombocytopenia ([name])   Anxiety   Traumatic rhabdomyolysis ([name])   Anti-infectives (From admission, onward)    Start     Dose/Rate Route Frequency Ordered Stop   07/02/22 1830  ceFAZolin (ANCEF) IVPB 1 g/50 mL premix        1 g 100 mL/hr over 30 Minutes Intravenous Every 6 hours 07/02/22 1731 07/03/22 0644   07/02/22 1430  ceFAZolin (ANCEF) IVPB 1 g/50 mL premix        1 g 100 mL/hr over 30 Minutes Intravenous  Once 07/02/22 1419 07/02/22 1420     .  POD/HD#: 1  41 y/o male, alcoholic, s/p fall with closed L bicondylar tibial plateau fracture and acute compartment syndrome left leg   -fall  - L bicondylar tibial plateau fracture s/p external fixation and acute compartment syndrome s/p 4 compartment fasciotomies   NWB L leg  Ice and elevate   Dressing changes to pinsites tomorrow   Return to OR Tuesday for closure of fasciotomies with or without ORIF of plateau   Therapy evals    Motor function improved   If unable to get fracture fixed on Tuesday will have OT fabricate foot plate   - EtOH abuse  CIWA  Pt wants to stop   SW consulted    - T7 compression fracture   No pain   Do not think this is acute   No further intervention   - Rhabdomyolysis   CK trending down  Continue with IVF  - transaminitis   Likely multifactorial---> etoh, rhabdo   - thrombocytopenia   Hold lovenox until >100k   - Pain management:  Multimodal   - ABL anemia/Hemodynamics  Monitor    - Medical issues   Per medicine    - DVT/PE prophylaxis:  Scd R leg  Hold lovenox due to thrombocytopenia  - ID:   Periop abx  - Metabolic Bone Disease:  Vitamin d deficiency    Supplement  - Activity:  Up with assistance   - Impediments to fracture healing:  EtOH abuse  Vitamin d deficiency   - Dispo:  Therapy evals  Continue with inpatient care   OR Tuesday    Mearl Latin, PA-C [phone] (C) 07/03/2022, 1:18 PM  [facility] [address] [place] [address] [phone] [phone] (F)    After 5pm and on the weekends please log on to Amion, go to orthopaedics and the look under the [facility] Call for the provider(s) on call. You can also call our office at [phone] and then follow the prompts to be connected to the call team.   Patient ID: Ryan Lynch, male   DOB: 1981-08-04, 41 y.o.   MRN: [id number]

## 2022-07-03 NOTE — Progress Notes (Addendum)
Walked into patients room after hearing the chair alarm going off. Walked in to see patient standing up in the middle of the room disconnected from IV. Another nurse came into room with me attempted to help him get into bed and while attempting patient fell to the floor. Once the patient fell, assessed for patients injuries no injuries noted. More staff came into room to get this patient into bed. Patient cleaned up and bed alarm on.   Prior to fall patient call light and phone was in reach. Chair alarm was on.  Doctor/ family notified successfully

## 2022-07-03 NOTE — Progress Notes (Signed)
PROGRESS NOTE    Ryan BusingJames Severtson  GNF:[id number] DOB: Jul 30, 1981 DOA: 07/02/2022 PCP: Patient, No Pcp Per   Brief Narrative: Ryan Lynch is a 41 y.o. male with a history of alcohol use and anxiety. Patient presented secondary to a fall down his stairs and subsequent worsening pain. Upon arrival to the ED, he was found to have evidence of a left tibial fracture and evidence of compartment syndrome, requiring emergent fasciotomy.   Assessment and Plan: Tibial plateau fracture, left Patient admitted to trauma service. S/p emergent fasciotomy. Management per primary.   Compartment syndrome of left lower extremity, initial encounter ([name]) See problem, Tibial plateau fracture, left.  Alcohol use Daily consumption. Alcohol level undetectable on admission. Patient hoping to detox. Started on CIWA on admission. Social work consulted. -Continue CIWA -Start Librium with taper -Continue thiamine, multivitamin and folic acid  Transaminitis AST/ALT elevated to 188/59 on admission respectively. Secondary to rhabdomyolysis from compartment syndrome and possibly alcohol use. Trending down with management of rhabdomyolysis.  Thrombocytopenia ([name]) Likely related to trauma and blood loss. Patient with evidence of hepatic steatosis and mild splenomegaly on imaging. No prior labs available. Platelets trending down. -Trend CBC -Watch for bleeding  Adrenal nodule ([name]) Bilateral with a left 16 mm indeterminate left adrenal hypodense mass and a right 10 mm indeterminate right adrenal gland mass. Recommendations for outpatient non-emergent CT or MRI of the adrenal glands.  Compression fracture of T7 vertebra ([name]) -Pain management per primary  Anxiety -Continue Zoloft -Hold Klonopin while on CIWA/Librium taper    DVT prophylaxis: Per primary Code Status:   Code Status: Full Code Family Communication: None at bedside Disposition Plan: Per primary   Procedures:  External fixation of left lower  leg  Antimicrobials: None   Subjective: Patient reports some pain. Otherwise no concerns.  Objective: BP 128/74 (BP Location: Left Arm)   Pulse (!) 109   Temp 98.6 F (37 C) (Oral)   Resp 16   Wt 86.2 kg   SpO2 96%   BMI 27.26 kg/m   Examination:  General exam: Appears calm and comfortable Respiratory system: Clear to auscultation. Respiratory effort normal. Cardiovascular system: S1 & S2 heard. Tachycardia. Normal rhythm. No murmurs, rubs, gallops or clicks. Gastrointestinal system: Abdomen is nondistended, soft and nontender. No organomegaly or masses felt. Normal bowel sounds heard. Central nervous system: Alert and oriented. Bilateral upper extremity tremors. Mild asterixis. Musculoskeletal: Left LE external fixation hardware  Skin: No cyanosis. No rashes   Data Reviewed: I have personally reviewed following labs and imaging studies  CBC Lab Results  Component Value Date   WBC 5.1 07/03/2022   RBC 2.65 (L) 07/03/2022   HGB 8.0 (L) 07/03/2022   HCT 24.0 (L) 07/03/2022   MCV 90.6 07/03/2022   MCH 30.2 07/03/2022   PLT 72 (L) 07/03/2022   MCHC 33.3 07/03/2022   RDW 15.2 07/03/2022     Last metabolic panel Lab Results  Component Value Date   NA 135 07/03/2022   K 4.3 07/03/2022   CL 106 07/03/2022   CO2 21 (L) 07/03/2022   BUN 9 07/03/2022   CREATININE 0.74 07/03/2022   GLUCOSE 125 (H) 07/03/2022   GFRNONAA >60 07/03/2022   CALCIUM 7.7 (L) 07/03/2022   PROT 5.2 (L) 07/03/2022   ALBUMIN 2.7 (L) 07/03/2022   BILITOT 2.1 (H) 07/03/2022   ALKPHOS 57 07/03/2022   AST 133 (H) 07/03/2022   ALT 41 07/03/2022   ANIONGAP 8 07/03/2022    GFR: CrCl cannot be calculated (  Unknown ideal weight.).  Recent Results (from the past 240 hour(s))  Resp Panel by RT-PCR (Flu A&B, Covid) Anterior Nasal Swab     Status: None   Collection Time: 07/02/22 10:44 AM   Specimen: Anterior Nasal Swab  Result Value Ref Range Status   SARS Coronavirus 2 by RT PCR NEGATIVE  NEGATIVE Final    Comment: (NOTE) SARS-CoV-2 target nucleic acids are NOT DETECTED.  The SARS-CoV-2 RNA is generally detectable in upper respiratory specimens during the acute phase of infection. The lowest concentration of SARS-CoV-2 viral copies this assay can detect is 138 copies/mL. A negative result does not preclude SARS-Cov-2 infection and should not be used as the sole basis for treatment or other patient management decisions. A negative result may occur with  improper specimen collection/handling, submission of specimen other than nasopharyngeal swab, presence of viral mutation(s) within the areas targeted by this assay, and inadequate number of viral copies(<138 copies/mL). A negative result must be combined with clinical observations, patient history, and epidemiological information. The expected result is Negative.  Fact Sheet for Patients:  BloggerCourse.com  Fact Sheet for Healthcare Providers:  SeriousBroker.it  This test is no t yet approved or cleared by the [place] FDA and  has been authorized for detection and/or diagnosis of SARS-CoV-2 by FDA under an Emergency Use Authorization (EUA). This EUA will remain  in effect (meaning this test can be used) for the duration of the COVID-19 declaration under Section 564(b)(1) of the Act, 21 U.S.C.section 360bbb-3(b)(1), unless the authorization is terminated  or revoked sooner.       Influenza A by PCR NEGATIVE NEGATIVE Final   Influenza B by PCR NEGATIVE NEGATIVE Final    Comment: (NOTE) The Xpert Xpress SARS-CoV-2/FLU/RSV plus assay is intended as an aid in the diagnosis of influenza from Nasopharyngeal swab specimens and should not be used as a sole basis for treatment. Nasal washings and aspirates are unacceptable for Xpert Xpress SARS-CoV-2/FLU/RSV testing.  Fact Sheet for Patients: BloggerCourse.com  Fact Sheet for Healthcare  Providers: SeriousBroker.it  This test is not yet approved or cleared by the [place] FDA and has been authorized for detection and/or diagnosis of SARS-CoV-2 by FDA under an Emergency Use Authorization (EUA). This EUA will remain in effect (meaning this test can be used) for the duration of the COVID-19 declaration under Section 564(b)(1) of the Act, 21 U.S.C. section 360bbb-3(b)(1), unless the authorization is terminated or revoked.  Performed at [facility], [address], [place], [place] [address]       Radiology Studies: CT KNEE LEFT WO CONTRAST  Result Date: 07/02/2022 CLINICAL DATA:  Knee trauma, tibial plateau fracture. EXAM: CT OF THE LEFT KNEE WITHOUT CONTRAST TECHNIQUE: Multidetector CT imaging of the left knee was performed according to the standard protocol. Multiplanar CT image reconstructions were also generated. RADIATION DOSE REDUCTION: This exam was performed according to the departmental dose-optimization program which includes automated exposure control, adjustment of the mA and/or kV according to patient size and/or use of iterative reconstruction technique. COMPARISON:  None Available. FINDINGS: Bones/Joint/Cartilage Severely comminuted fracture of the lateral tibial plateau with approximately 2 cm wedge-shaped depression. The depressed osseous fragment measures at least 2.5 x 1.5 cm. There is also mildly displaced medial malleolar fracture without significant depression. There is a transverse fracture through the proximal tibial metadiaphysis. Large suprapatellar joint effusion with lipohemarthrosis. Ligaments Evaluation of ligaments is limited on CT examination. Muscles and Tendons Edema about the origin of the medial head of  the gastrocnemius and soleus, likely sequela of the trauma. No drainable fluid collection or hematoma. Quadriceps and patellar tendon appear intact. Soft tissues Mild soft tissue edema about the  knee. IMPRESSION: 1. Comminuted depressed lateral tibial plateau fracture with approximately 2 cm wedge-shaped depression, nondisplaced fracture of the medial tibial plateau and transverse fracture of the tibial metaphysis, findings most consistent with Schatzker type 6 fracture. 2.  Large joint effusion with lipohemarthrosis. 3. Muscle edema and surrounding inflammatory changes of the lateral head of the gastrocnemius and soleus without evidence of intramuscular hematoma or fluid collection. Electronically Signed   By: Larose Hires D.O.   On: 07/02/2022 21:24   DG Hand Complete Right  Result Date: 07/02/2022 CLINICAL DATA:  Bruising on posterior aspect of hand. EXAM: RIGHT HAND - COMPLETE 3+ VIEW COMPARISON:  None Available. FINDINGS: There is no evidence of fracture or dislocation. There is no evidence of arthropathy or other focal bone abnormality. Dorsal soft tissue edema overlies the metacarpals. IMPRESSION: Dorsal soft tissue edema. No fracture or dislocation. Electronically Signed   By: Narda Rutherford M.D.   On: 07/02/2022 19:35   DG Knee Left Port  Result Date: 07/02/2022 CLINICAL DATA:  Postop external fixator placement. EXAM: PORTABLE LEFT KNEE - 1-2 VIEW COMPARISON:  Preoperative radiographs. FINDINGS: External fixator placement with femoral pain only included on the AP view. The tibial pin is not included in the field of view. Comminuted proximal tibial fracture in slightly improved alignment from preoperative exam. A wound VAC is partially visualized. IMPRESSION: External fixator placement, femoral pain included on the AP view, tibial pins not visualized on the current exam. Comminuted proximal tibial fracture in improved alignment from preoperative exam. Electronically Signed   By: Narda Rutherford M.D.   On: 07/02/2022 19:34   DG Tibia/Fibula Left  Result Date: 07/02/2022 CLINICAL DATA:  External fixation. EXAM: LEFT TIBIA AND FIBULA - 2 VIEW COMPARISON:  Preoperative imaging. FINDINGS:  Four fluoroscopic spot views of the left tibia and fibula obtained in the operating room. Two pins are seen in the tibial shaft. Improved alignment of comminuted proximal tibial fracture. Fluoroscopy time 15 seconds. Dose 1.04 mGy. IMPRESSION: Intraoperative fluoroscopy during left tibia and fibula external fixation placement. Electronically Signed   By: Narda Rutherford M.D.   On: 07/02/2022 16:30   DG C-Arm 1-60 Min-No Report  Result Date: 07/02/2022 Fluoroscopy was utilized by the requesting physician.  No radiographic interpretation.   DG Tibia/Fibula Left  Result Date: 07/02/2022 CLINICAL DATA:  41 year old male status post fall down stairs. Comminuted left tibial plateau fracture. EXAM: LEFT TIBIA AND FIBULA - 2 VIEW COMPARISON:  Left tib fib CT today reported separately. FINDINGS: Comminuted and impacted tibial plateau and proximal metadiaphysis fracture as demonstrated by CT. Associated knee joint effusion. Calf soft tissue swelling. Distal left femur, patella and proximal fibula appear intact. Left ankle is detailed separately today. IMPRESSION: Comminuted and impacted left tibial plateau and proximal metadiaphysis fracture as demonstrated by CT today. Associated knee joint effusion. Electronically Signed   By: Odessa Fleming M.D.   On: 07/02/2022 10:57   DG Pelvis Portable  Result Date: 07/02/2022 CLINICAL DATA:  41 year old male status post fall down stairs. Comminuted left tibial plateau fracture. EXAM: PORTABLE PELVIS 1-2 VIEWS COMPARISON:  CT Chest, Abdomen, and Pelvis today reported separately. FINDINGS: Portable AP view at 0949 hours. Femoral heads are normally located. Intact pelvis and proximal femurs. Symphysis and SI joints appear within normal limits. Negative visible bowel gas. IMPRESSION: No acute fracture  or dislocation identified about the pelvis. Electronically Signed   By: Odessa Fleming M.D.   On: 07/02/2022 10:56   DG Ankle Complete Left  Result Date: 07/02/2022 CLINICAL DATA:   41 year old male status post fall down stairs. Comminuted left tibial plateau fracture. EXAM: LEFT ANKLE COMPLETE - 3+ VIEW COMPARISON:  Left tib fib series and CT today. FINDINGS: Mortise joint alignment maintained. Talar dome intact. No joint effusion identified. Calcaneus appears intact. Distal tibia and fibula, talus and visible bones of the left foot appear intact. IMPRESSION: No acute fracture or dislocation identified about the left ankle. Electronically Signed   By: Odessa Fleming M.D.   On: 07/02/2022 10:54   CT CERVICAL SPINE WO CONTRAST  Result Date: 07/02/2022 CLINICAL DATA:  41 year old male status post fall down stairs. Comminuted left tibial plateau fracture. EXAM: CT CERVICAL SPINE WITHOUT CONTRAST TECHNIQUE: Multidetector CT imaging of the cervical spine was performed without intravenous contrast. Multiplanar CT image reconstructions were also generated. RADIATION DOSE REDUCTION: This exam was performed according to the departmental dose-optimization program which includes automated exposure control, adjustment of the mA and/or kV according to patient size and/or use of iterative reconstruction technique. COMPARISON:  Head CT today. FINDINGS: Alignment: Straightening of cervical lordosis. Cervicothoracic junction alignment is within normal limits. Bilateral posterior element alignment is within normal limits. Skull base and vertebrae: Osteopenia for age. Visualized skull base is intact. No atlanto-occipital dissociation. C1 and C2 appear intact and aligned. No acute osseous abnormality identified. Soft tissues and spinal canal: No prevertebral fluid or swelling. No visible canal hematoma. Negative visible noncontrast neck soft tissues. Disc levels:  Mild cervical disc bulging. Upper chest: Negative. IMPRESSION: No acute traumatic injury identified in the cervical spine. Osteopenia for age. Electronically Signed   By: Odessa Fleming M.D.   On: 07/02/2022 10:53   CT CHEST ABDOMEN PELVIS W CONTRAST  Result  Date: 07/02/2022 CLINICAL DATA:  Status post fall down the stairs. EXAM: CT CHEST, ABDOMEN, AND PELVIS WITH CONTRAST TECHNIQUE: Multidetector CT imaging of the chest, abdomen and pelvis was performed following the standard protocol during bolus administration of intravenous contrast. RADIATION DOSE REDUCTION: This exam was performed according to the departmental dose-optimization program which includes automated exposure control, adjustment of the mA and/or kV according to patient size and/or use of iterative reconstruction technique. CONTRAST:  OMNIPAQUE IOHEXOL 300 MG/ML  SOLN COMPARISON:  None Available. FINDINGS: CT CHEST FINDINGS Cardiovascular: No significant vascular findings. Normal heart size. No pericardial effusion. Mediastinum/Nodes: No enlarged mediastinal, hilar, or axillary lymph nodes. Thyroid gland, trachea, and esophagus demonstrate no significant findings. Lungs/Pleura: Lungs are clear. No pleural effusion or pneumothorax. Musculoskeletal: T7 mild anterior vertebral body compression fracture with approximately 20% height loss. No aggressive osseous lesion. CT ABDOMEN PELVIS FINDINGS Hepatobiliary: No focal liver abnormality is seen. Diffuse low attenuation of the liver as can be seen with hepatic steatosis. Decompressed gallbladder with a gallstone in the fundus. No biliary ductal dilatation. Pancreas: Unremarkable. No pancreatic ductal dilatation or surrounding inflammatory changes. Spleen: Mild splenomegaly without focal abnormality measuring 13 x 13.5 x 7.9 cm. Adrenals/Urinary Tract: 16 mm indeterminate left adrenal hypodense mass. Indeterminate 10 mm right adrenal gland mass. Kidneys are normal, without renal calculi, focal lesion, or hydronephrosis. Bladder is unremarkable. Stomach/Bowel: Stomach is within normal limits. No evidence of bowel wall thickening, distention, or inflammatory changes. Appendix is normal. Vascular/Lymphatic: No significant vascular findings are present. No  enlarged abdominal or pelvic lymph nodes. Reproductive: Prostate is unremarkable. Other: No abdominal wall  hernia or abnormality. No abdominopelvic ascites. Musculoskeletal: No acute osseous abnormality. No aggressive osseous lesion. IMPRESSION: 1. T7 mild anterior vertebral body compression fracture with approximately 20% height loss. 2. Otherwise, no acute traumatic injury to the chest, abdomen or pelvis. 3. Hepatic steatosis. 4. Cholelithiasis without evidence of cholecystitis. 5. Indeterminate bilateral adrenal gland nodules. Recommend further characterization with a non emergent CT or MRI of the adrenal glands. Electronically Signed   By: Elige Ko M.D.   On: 07/02/2022 10:51   CT HEAD WO CONTRAST  Result Date: 07/02/2022 CLINICAL DATA:  41 year old male status post fall down stairs. Comminuted left tibial plateau fracture. EXAM: CT HEAD WITHOUT CONTRAST TECHNIQUE: Contiguous axial images were obtained from the base of the skull through the vertex without intravenous contrast. RADIATION DOSE REDUCTION: This exam was performed according to the departmental dose-optimization program which includes automated exposure control, adjustment of the mA and/or kV according to patient size and/or use of iterative reconstruction technique. COMPARISON:  None Available. FINDINGS: Brain: Suggestion of generalized cerebral volume loss for age. No midline shift, ventriculomegaly, mass effect, evidence of mass lesion, intracranial hemorrhage or evidence of cortically based acute infarction. Gray-white matter differentiation is within normal limits throughout the brain. Vascular: Mild Calcified atherosclerosis at the skull base. No suspicious intracranial vascular hyperdensity. Skull: No skull fracture identified. Sinuses/Orbits: Visualized paranasal sinuses and mastoids are clear. Other: Broad-based right lateral convexity scalp hematoma. No scalp soft tissue gas. Visualized orbit soft tissues are within normal limits.  IMPRESSION: 1. Broad-based right scalp hematoma without underlying skull fracture. 2. Negative noncontrast CT appearance of the brain. Electronically Signed   By: Odessa Fleming M.D.   On: 07/02/2022 10:51   DG Chest Port 1 View  Result Date: 07/02/2022 CLINICAL DATA:  41 year old male status post fall down stairs. Comminuted left tibial plateau fracture. EXAM: PORTABLE CHEST 1 VIEW COMPARISON:  None Available. FINDINGS: Portable AP supine view at 0945 hours. Normal lung volumes and mediastinal contours. Visualized tracheal air column is within normal limits. Allowing for portable technique the lungs are clear. No pneumothorax or pleural effusion identified on this supine view. No osseous abnormality identified. Negative visible bowel gas. IMPRESSION: Negative portable chest. Electronically Signed   By: Odessa Fleming M.D.   On: 07/02/2022 10:49   CT Tibia Fibula Left Wo Contrast  Result Date: 07/02/2022 CLINICAL DATA:  Mechanical fall 6 hours ago. Deformity of the left leg. EXAM: CT OF THE LOWER LEFT EXTREMITY WITHOUT CONTRAST TECHNIQUE: Multidetector CT imaging of the lower left extremity was performed according to the standard protocol. RADIATION DOSE REDUCTION: This exam was performed according to the departmental dose-optimization program which includes automated exposure control, adjustment of the mA and/or kV according to patient size and/or use of iterative reconstruction technique. COMPARISON:  None Available. FINDINGS: Bones/Joint/Cartilage Severely comminuted fracture of the lateral tibial plateau with 2 cm of maximum depression of the articular surface with the depressed area measuring approximately 3 x 3.6 cm. Fracture involves the medial and lateral tibial eminence. Nondisplaced intra-articular fracture of the posterior medial tibial plateau without significant depression. Transverse fracture through the proximal tibial metaphysis without significant displacement. No other fracture or dislocation.  Large  hemarthrosis. Ligaments Ligaments are suboptimally evaluated by CT. Muscles and Tendons No significant muscle atrophy. Mild perifascial edema deep to the lateral gastrocnemius muscle with poor definition of the soleus muscle as can be seen with muscle edema secondary to direct trauma or myositis. Soft tissue No fluid collection or hematoma.  No soft tissue  mass. IMPRESSION: 1. Severely comminuted fracture of the lateral tibial plateau with 2 cm of maximum depression of the articular surface with the depressed area measuring approximately 3 x 3.6 cm. Fracture involves the medial and lateral tibial eminence. 2. Nondisplaced intra-articular fracture of the posterior medial tibial plateau without significant depression. 3. Transverse fracture through the proximal tibial metaphysis without significant displacement. 4. Large hemarthrosis. 5. Mild perifascial edema deep to the lateral gastrocnemius muscle with poor definition of the soleus muscle as can be seen with muscle edema secondary to direct trauma or myositis. These results were called by telephone at the time of interpretation on 07/02/2022 at 10:44 am to provider Vivien Rossetti , who verbally acknowledged these results. Electronically Signed   By: Elige Ko M.D.   On: 07/02/2022 10:45      LOS: 1 day    Jacquelin Hawking, MD [facility] 07/03/2022, 7:04 AM   If 7PM-7AM, please contact night-coverage [email or web address]

## 2022-07-03 NOTE — Assessment & Plan Note (Signed)
See problem, Tibial plateau fracture, left.

## 2022-07-04 ENCOUNTER — Inpatient Hospital Stay (HOSPITAL_COMMUNITY): Payer: BC Managed Care – PPO

## 2022-07-04 DIAGNOSIS — Z789 Other specified health status: Secondary | ICD-10-CM | POA: Diagnosis not present

## 2022-07-04 DIAGNOSIS — R140 Abdominal distension (gaseous): Secondary | ICD-10-CM

## 2022-07-04 DIAGNOSIS — F419 Anxiety disorder, unspecified: Secondary | ICD-10-CM | POA: Diagnosis not present

## 2022-07-04 DIAGNOSIS — E278 Other specified disorders of adrenal gland: Secondary | ICD-10-CM | POA: Diagnosis not present

## 2022-07-04 DIAGNOSIS — K7682 Hepatic encephalopathy: Secondary | ICD-10-CM

## 2022-07-04 DIAGNOSIS — K567 Ileus, unspecified: Secondary | ICD-10-CM | POA: Insufficient documentation

## 2022-07-04 DIAGNOSIS — D696 Thrombocytopenia, unspecified: Secondary | ICD-10-CM | POA: Diagnosis not present

## 2022-07-04 DIAGNOSIS — G9341 Metabolic encephalopathy: Secondary | ICD-10-CM | POA: Insufficient documentation

## 2022-07-04 LAB — CBC WITH DIFFERENTIAL/PLATELET
Abs Immature Granulocytes: 0.06 10*3/uL (ref 0.00–0.07)
Basophils Absolute: 0 10*3/uL (ref 0.0–0.1)
Basophils Relative: 0 %
Eosinophils Absolute: 0 10*3/uL (ref 0.0–0.5)
Eosinophils Relative: 0 %
HCT: 21.9 % — ABNORMAL LOW (ref 39.0–52.0)
Hemoglobin: 7.1 g/dL — ABNORMAL LOW (ref 13.0–17.0)
Immature Granulocytes: 1 %
Lymphocytes Relative: 11 %
Lymphs Abs: 0.7 10*3/uL (ref 0.7–4.0)
MCH: 29.6 pg (ref 26.0–34.0)
MCHC: 32.4 g/dL (ref 30.0–36.0)
MCV: 91.3 fL (ref 80.0–100.0)
Monocytes Absolute: 0.4 10*3/uL (ref 0.1–1.0)
Monocytes Relative: 6 %
Neutro Abs: 4.8 10*3/uL (ref 1.7–7.7)
Neutrophils Relative %: 82 %
Platelets: 82 10*3/uL — ABNORMAL LOW (ref 150–400)
RBC: 2.4 MIL/uL — ABNORMAL LOW (ref 4.22–5.81)
RDW: 15.4 % (ref 11.5–15.5)
WBC: 5.9 10*3/uL (ref 4.0–10.5)
nRBC: 0 % (ref 0.0–0.2)

## 2022-07-04 LAB — COMPREHENSIVE METABOLIC PANEL
ALT: 47 U/L — ABNORMAL HIGH (ref 0–44)
AST: 171 U/L — ABNORMAL HIGH (ref 15–41)
Albumin: 2.8 g/dL — ABNORMAL LOW (ref 3.5–5.0)
Alkaline Phosphatase: 66 U/L (ref 38–126)
Anion gap: 7 (ref 5–15)
BUN: 12 mg/dL (ref 6–20)
CO2: 22 mmol/L (ref 22–32)
Calcium: 8.1 mg/dL — ABNORMAL LOW (ref 8.9–10.3)
Chloride: 105 mmol/L (ref 98–111)
Creatinine, Ser: 0.83 mg/dL (ref 0.61–1.24)
GFR, Estimated: >60 mL/min (ref >60)
Glucose, Bld: 129 mg/dL — ABNORMAL HIGH (ref 70–99)
Potassium: 3.9 mmol/L (ref 3.5–5.1)
Sodium: 134 mmol/L — ABNORMAL LOW (ref 135–145)
Total Bilirubin: 2 mg/dL — ABNORMAL HIGH (ref 0.3–1.2)
Total Protein: 5.3 g/dL — ABNORMAL LOW (ref 6.5–8.1)

## 2022-07-04 LAB — PREPARE RBC (CROSSMATCH)

## 2022-07-04 LAB — CK: Total CK: 1017 U/L — ABNORMAL HIGH (ref 49–397)

## 2022-07-04 LAB — PREALBUMIN: Prealbumin: 20 mg/dL (ref 18–38)

## 2022-07-04 LAB — GLUCOSE, CAPILLARY: Glucose-Capillary: 128 mg/dL — ABNORMAL HIGH (ref 70–99)

## 2022-07-04 MED ORDER — CEFAZOLIN SODIUM-DEXTROSE 2-4 GM/100ML-% IV SOLN
2.0000 g | INTRAVENOUS | Status: AC
  Administered 2022-07-05: 2 g via INTRAVENOUS
  Filled 2022-07-04: qty 100

## 2022-07-04 MED ORDER — SODIUM CHLORIDE 0.9% IV SOLUTION: Freq: Once | INTRAVENOUS | Status: DC

## 2022-07-04 MED ORDER — ENSURE ENLIVE PO LIQD: 237.0000 mL | Freq: Two times a day (BID) | ORAL | Status: DC

## 2022-07-04 MED ORDER — FUROSEMIDE 10 MG/ML IJ SOLN: 20.0000 mg | Freq: Once | INTRAMUSCULAR | Status: DC

## 2022-07-04 MED ORDER — LACTULOSE 10 GM/15ML PO SOLN: 10.0000 g | Freq: Three times a day (TID) | ORAL | Status: DC

## 2022-07-04 MED ORDER — ACETAMINOPHEN 325 MG PO TABS: 650.0000 mg | ORAL_TABLET | Freq: Once | ORAL | Status: DC

## 2022-07-04 MED ORDER — IOHEXOL 9 MG/ML PO SOLN
500.0000 mL | ORAL | Status: AC
  Administered 2022-07-04 (×2): 500 mL via ORAL

## 2022-07-04 NOTE — Plan of Care (Signed)
  Problem: Clinical Measurements: Goal: Ability to maintain clinical measurements within normal limits will improve Outcome: Not Progressing Goal: Will remain free from infection Outcome: Not Progressing Goal: Diagnostic test results will improve Outcome: Not Progressing Goal: Respiratory complications will improve Outcome: Not Progressing Goal: Cardiovascular complication will be avoided Outcome: Not Progressing   Problem: Activity: Goal: Risk for activity intolerance will decrease Outcome: Not Progressing   Problem: Nutrition: Goal: Adequate nutrition will be maintained Outcome: Not Progressing   Problem: Elimination: Goal: Will not experience complications related to bowel motility Outcome: Not Progressing Goal: Will not experience complications related to urinary retention Outcome: Not Progressing   Problem: Pain Managment: Goal: General experience of comfort will improve Outcome: Not Progressing   Problem: Safety: Goal: Ability to remain free from injury will improve Outcome: Not Progressing   Problem: Skin Integrity: Goal: Risk for impaired skin integrity will decrease Outcome: Not Progressing

## 2022-07-04 NOTE — Assessment & Plan Note (Addendum)
Likely multifactorial in setting of surgery and narcotics. No abdominal pain or emesis.  CT abd/pelvis with evidence of ileus 8/24, increase in amount of small bowel gas suggesting ileus or partial obstruction  Plain films this AM with mild to moderate small bowel dilatation Pulled NG tube multiple times, trial clears today given he'd had BM's noted overnight -> will transition back to NPO given recurrent N/V Will ask surgery to see

## 2022-07-04 NOTE — Assessment & Plan Note (Addendum)
Patient with an ammonia of 96 on admission but without significant confusion at that time.  He seems more confused now. Likely multifactorial with alcohol withdrawal. Ammonia recently fluctuating from normal (8/24) to mildly elevated.   I don't think this pattern of elevation c/w with hepatic encephalopathy, but would consider dosing if continued unresolving encephalopathy and elevated ammonia Lactulose enema given x1 8/28 given continued encephalopathy with elevated ammonia (will hold today and follow again tomorrow) CIWA as above High dose thiamine Normal B12, folate Will work up additionally as needed - in general he seems to be improving, will follow, may need additional lactulose

## 2022-07-04 NOTE — Progress Notes (Addendum)
Physical Therapy Treatment Patient Details Name: Ryan Lynch MRN: [id number] DOB: 30-Jun-1981 Today's Date: 07/04/2022   History of Present Illness 41 yo male admitted following fall down the stairs with subsequent tibial plateua fx requiring 4 compartment fasciotomies and placement of ex fix. PMH includes anxiety, ACL repair, psoriasis and daily consumption of alcohol.    PT Comments    Pt confused, diaphoretic and drowsy in setting of recent Ativan dosage. Pt requiring min assist for bed mobility, however, deferred out of bed due to level of alertness; pt able to participate in bed level exercises. HR 123-141 bpm, desat to 86% on 2L O2, so bumped to 4L O2 with rebound to 92%. Will reassess post op.    Recommendations for follow up therapy are one component of a multi-disciplinary discharge planning process, led by the attending physician.  Recommendations may be updated based on patient status, additional functional criteria and insurance authorization.  Follow Up Recommendations  Home health PT (pending progress)     Assistance Recommended at Discharge Frequent or constant Supervision/Assistance  Patient can return home with the following A little help with walking and/or transfers;A little help with bathing/dressing/bathroom;Assistance with cooking/housework;Assist for transportation;Help with stairs or ramp for entrance   Equipment Recommendations  Rolling walker (2 wheels);BSC/3in1;Wheelchair (measurements PT);Wheelchair cushion (measurements PT)    Recommendations for Other Services       Precautions / Restrictions Precautions Precautions: Fall;Other (comment) Precaution Comments: ex fix, wound vac, watch HR/O2 Restrictions Weight Bearing Restrictions: Yes LLE Weight Bearing: Non weight bearing     Mobility  Bed Mobility Overal bed mobility: Needs Assistance Bed Mobility: Supine to Sit, Sit to Supine     Supine to sit: Min assist, HOB elevated, +2 for  safety/equipment Sit to supine: Min assist, +2 for safety/equipment   General bed mobility comments: Assist for LLE negotiation    Transfers                        Ambulation/Gait                   Stairs             Wheelchair Mobility    Modified Rankin (Stroke Patients Only)       Balance Overall balance assessment: Needs assistance Sitting-balance support: Feet unsupported Sitting balance-Leahy Scale: Fair     Standing balance support: Bilateral upper extremity supported Standing balance-Leahy Scale: Poor Standing balance comment: reliant on RW                            Cognition Arousal/Alertness: Awake/alert Behavior During Therapy: Anxious, Restless Overall Cognitive Status: Impaired/Different from baseline Area of Impairment: Safety/judgement, Following commands, Attention, Awareness                   Current Attention Level: Sustained   Following Commands: Follows one step commands inconsistently (easily confused and distracted with simple commands including strategies to improve balance and comfort, likely related to high anxiety) Safety/Judgement: Decreased awareness of deficits, Decreased awareness of safety Awareness: Emergent   General Comments: Pt A&O to self, place, month; not year, stating it was 1993, then correcting to 2021. Drowsy towards end of session likely due to Ativan        Exercises General Exercises - Lower Extremity Ankle Circles/Pumps: Left, 10 reps, Supine Heel Slides: Right, 10 reps, Supine    General Comments  Pertinent Vitals/Pain Pain Assessment Pain Assessment: Faces Faces Pain Scale: Hurts little more Pain Location: LLE Pain Descriptors / Indicators: Operative site guarding Pain Intervention(s): Monitored during session    Home Living                          Prior Function            PT Goals (current goals can now be found in the care plan  section) Acute Rehab PT Goals Patient Stated Goal: did not state PT Goal Formulation: With patient Time For Goal Achievement: 07/17/22 Potential to Achieve Goals: Good    Frequency    Min 5X/week      PT Plan Current plan remains appropriate    Co-evaluation              AM-PAC PT "6 Clicks" Mobility   Outcome Measure  Help needed turning from your back to your side while in a flat bed without using bedrails?: A Little Help needed moving from lying on your back to sitting on the side of a flat bed without using bedrails?: A Little Help needed moving to and from a bed to a chair (including a wheelchair)?: A Little Help needed standing up from a chair using your arms (e.g., wheelchair or bedside chair)?: A Little Help needed to walk in hospital room?: A Lot Help needed climbing 3-5 steps with a railing? : Total 6 Click Score: 15    End of Session Equipment Utilized During Treatment: Gait belt;Oxygen Activity Tolerance: Patient limited by lethargy Patient left: in bed;with bed alarm set;with call bell/phone within reach Nurse Communication: Mobility status;Other (comment) (change in O2) PT Visit Diagnosis: Unsteadiness on feet (R26.81);Difficulty in walking, not elsewhere classified (R26.2);Pain Pain - Right/Left: Left Pain - part of body: Leg     Time: 5809-9833 PT Time Calculation (min) (ACUTE ONLY): 26 min  Charges:  $Therapeutic Activity: 23-37 mins                     Lillia Pauls, PT, DPT Acute Rehabilitation Services Office [phone]    Ryan Lynch 07/04/2022, 5:07 PM

## 2022-07-04 NOTE — Progress Notes (Addendum)
[facility] Progress Note  Patient ID: Ryan Lynch MRN: [id number] DOB/AGE: 1981/11/01 41 y.o.  Subjective:  Doing fair Anxious  Pain tolerable in left leg Fell yesterday getting up on his own, denies any new pain   Feels like he needs to have a BM  + tachycardia   Pt seems more confused to me than yesterday Think he is starting to withdraw Have discussed with nursing staff, including unit director, will increase level of care to progressive care    ROS As above  Objective:   VITALS:   Vitals:   07/03/22 2211 07/03/22 2222 07/04/22 0539 07/04/22 0834  BP: (!) 150/76 (!) 150/76 121/70 122/77  Pulse: (!) 125 (!) 125 (!) 122 (!) 112  Resp: 16 16 18    Temp: 98.7 F (37.1 C) 98.7 F (37.1 C) 99.6 F (37.6 C) 98.6 F (37 C)  TempSrc: Oral Oral Oral Oral  SpO2: 94%  90% 91%  Weight:  91.6 kg    Height:  5\' 11"  (1.803 m)      Estimated body mass index is 28.17 kg/m as calculated from the following:   Height as of this encounter: 5\' 11"  (1.803 m).   Weight as of this encounter: 91.6 kg.   Intake/Output      08/20 0701 08/21 0700 08/21 0701 08/22 0700   I.V. (mL/kg)     IV Piggyback     Total Intake(mL/kg)     Urine (mL/kg/hr) 850 (0.4)    Drains 570    Blood     Total Output 1420    Net -1420           LABS  Results for orders placed or performed during the hospital encounter of 07/02/22 (from the past 24 hour(s))  CBC with Differential/Platelet     Status: Abnormal   Collection Time: 07/04/22  2:31 AM  Result Value Ref Range   WBC 5.9 4.0 - 10.5 K/uL   RBC 2.40 (L) 4.22 - 5.81 MIL/uL   Hemoglobin 7.1 (L) 13.0 - 17.0 g/dL   HCT 9/22 (L) 07/04/22 - 07/06/22 %   MCV 91.3 80.0 - 100.0 fL   MCH 29.6 26.0 - 34.0 pg   MCHC 32.4 30.0 - 36.0 g/dL   RDW 79.8 92.1 - 19.4 %   Platelets 82 (L) 150 - 400 K/uL   nRBC 0.0 0.0 - 0.2 %   Neutrophils Relative % 82 %   Neutro Abs 4.8  1.7 - 7.7 K/uL   Lymphocytes Relative 11 %   Lymphs Abs 0.7 0.7 - 4.0 K/uL   Monocytes Relative 6 %   Monocytes Absolute 0.4 0.1 - 1.0 K/uL   Eosinophils Relative 0 %   Eosinophils Absolute 0.0 0.0 - 0.5 K/uL   Basophils Relative 0 %   Basophils Absolute 0.0 0.0 - 0.1 K/uL   Immature Granulocytes 1 %   Abs Immature Granulocytes 0.06 0.00 - 0.07 K/uL  Comprehensive metabolic panel     Status: Abnormal   Collection Time: 07/04/22  2:31 AM  Result Value Ref Range   Sodium 134 (L) 135 - 145 mmol/L   Potassium 3.9 3.5 - 5.1 mmol/L   Chloride 105 98 - 111 mmol/L   CO2 22 22 - 32 mmol/L   Glucose, Bld 129 (H) 70 - 99 mg/dL  BUN 12 6 - 20 mg/dL   Creatinine, Ser 1.02 0.61 - 1.24 mg/dL   Calcium 8.1 (L) 8.9 - 10.3 mg/dL   Total Protein 5.3 (L) 6.5 - 8.1 g/dL   Albumin 2.8 (L) 3.5 - 5.0 g/dL   AST 725 (H) 15 - 41 U/L   ALT 47 (H) 0 - 44 U/L   Alkaline Phosphatase 66 38 - 126 U/L   Total Bilirubin 2.0 (H) 0.3 - 1.2 mg/dL   GFR, Estimated >36 >64 mL/min   Anion gap 7 5 - 15  CK     Status: Abnormal   Collection Time: 07/04/22  2:31 AM  Result Value Ref Range   Total CK 1,017 (H) 49 - 397 U/L  Prealbumin     Status: None   Collection Time: 07/04/22  2:31 AM  Result Value Ref Range   Prealbumin 20 18 - 38 mg/dL     PHYSICAL EXAM:   Gen: sitting up in bed, gown pulled up to chest, sheets barely covering groin area, lying sideways in bed. He does seem more confused today than yesterday  Lungs: unlabored Cardiac: tachy but reg Abd: distended, +BS, NT Ext:       Left lower Extremity              Vac with good seal              Ex fix stable             Blood drainage from thigh pinsites (this is expected)   New kerlix applied by myself             Tibia pinsites look good             Ext warm              Mild swelling to foot and ankle             + DP pulse             Pt has much improved motor function with ankle extension and flexion              Great toe extension  intact             DPN, SPN sensation nearly normal per his report             TN sensation still diminished                  Assessment/Plan: 2 Days Post-Op    Anti-infectives (From admission, onward)    Start     Dose/Rate Route Frequency Ordered Stop   07/02/22 1830  ceFAZolin (ANCEF) IVPB 1 g/50 mL premix        1 g 100 mL/hr over 30 Minutes Intravenous Every 6 hours 07/02/22 1731 07/03/22 2006   07/02/22 1430  ceFAZolin (ANCEF) IVPB 1 g/50 mL premix        1 g 100 mL/hr over 30 Minutes Intravenous  Once 07/02/22 1419 07/03/22 2006     .  POD/HD#: 2  41 y/o male, alcoholic, s/p fall with closed L bicondylar tibial plateau fracture and acute compartment syndrome left leg    -fall   - L bicondylar tibial plateau fracture s/p external fixation and acute compartment syndrome s/p 4 compartment fasciotomies              NWB L leg             Ice  and elevate              Dressing changes to pinsites as needed             Return to OR tomorrow for attempted closure of fasciotomies with or without ORIF of plateau              Therapies                Motor function improved                         If unable to get fracture fixed on Tuesday will have OT fabricate foot plate    - EtOH abuse             CIWA             Pt wants to stop              SW consulted    Possibly going through some withdrawals   Discussed with nursing team   Increase level of care to progressive    - T7 compression fracture              No pain              Do not think this is acute              No further intervention    - Rhabdomyolysis              CK steady              Continue with IVF   - transaminitis              Likely multifactorial---> etoh, rhabdo    - thrombocytopenia              Hold lovenox until >100k     - Pain management:             Multimodal    - ABL anemia/Hemodynamics             transfuse 2 units PRBCs given tachycardia and tomorrows surgery                - Medical issues              Per medicine               - DVT/PE prophylaxis:             Scd R leg             Hold lovenox due to thrombocytopenia  - ID:              Periop abx   - Metabolic Bone Disease:             Vitamin d deficiency                          Supplement   - Activity:             Up with assistance    - Impediments to fracture healing:             EtOH abuse             Vitamin d deficiency    - Dispo:             Therapies  Continue with inpatient care              OR tomorrow   Mearl Latin, PA-C [phone] (C) 07/04/2022, 10:57 AM  [facility] [address] [place] [address] [phone] [phone] (F)    After 5pm and on the weekends please log on to Amion, go to orthopaedics and the look under the [facility] Call for the provider(s) on call. You can also call our office at [phone] and then follow the prompts to be connected to the call team.   Patient ID: Chanda Busing, male   DOB: 12-17-1980, 41 y.o.   MRN: [id number]

## 2022-07-04 NOTE — Progress Notes (Signed)
Initial Nutrition Assessment  DOCUMENTATION CODES:   Not applicable  INTERVENTION:  Ensure Enlive po TID, each supplement provides 350 kcal and 20 grams of protein. Magic cup TID with meals, each supplement provides 290 kcal and 9 grams of protein Continue MVI, Vitamin C and zinc daily  NUTRITION DIAGNOSIS:   Increased nutrient needs related to acute illness, post-op healing as evidenced by estimated needs.  GOAL:   Patient will meet greater than or equal to 90% of their needs  MONITOR:   PO intake, Supplement acceptance, Labs, Weight trends, I & O's, Skin  REASON FOR ASSESSMENT:   Consult Assessment of nutrition requirement/status  ASSESSMENT:   Pt admitted after a fall leading to L bicondylar tibial plateau fracture s/p external fixation and acute compartment syndrome s/p 4 compartment fasciotomies. PMH significant for alcohol use   Patient off unit at time of visit. Noted to have abdominal distension with decreased bowel sounds and liquid BM's. Having abdominal xray at time of visit to assess for post-op ileus.   Per Ortho, plans to return to OR tomorrow for attempted closure of fasciotomies with/without ORIF of plateau.   Also of note, pt with bilateral adrenal nodules. Plans for outpatient non-emergent CT or MRI of adrenal glands.   Meal completions: 8/21: 0% x 2 recorded meals   At this time patient continues with a regular diet. Will monitor diet tolerance and adjust nutrition recommendations as appropriate. In the meantime, pt would benefit from nutrition supplements as well as vitamins to support wound healing and post-op healing.  Medications: Vitamin C daily, Vitamin D3 BID, colace, folvite, lasix, MVI, miralax, thiamine, zinc 220mg  daily (started 8/20) IV drips: LR @ 146ml/hr  Labs: sodium 134, AST 171, ALT 47, Vitamin D 12.19 (L)  Non-pressure wound therapy: L tibial lateral : 45m x12 hours L tibial mid: x12 hours  NUTRITION - FOCUSED PHYSICAL  EXAM: Patient unavailable. Deferred to follow up.   Diet Order:   Diet Order             Diet NPO time specified  Diet effective midnight           Diet regular Room service appropriate? Yes; Fluid consistency: Thin  Diet effective now                   EDUCATION NEEDS:   No education needs have been identified at this time  Skin:  Skin Assessment: Skin Integrity Issues: Skin Integrity Issues:: Incisions Incisions: L leg (closed)  Last BM:  8/21  Height:   Ht Readings from Last 1 Encounters:  07/03/22 5\' 11"  (1.803 m)    Weight:   Wt Readings from Last 1 Encounters:  07/03/22 91.6 kg   BMI:  Body mass index is 28.17 kg/m.  Estimated Nutritional Needs:   Kcal:  2400-2600  Protein:  120-135g  Fluid:  >/=2L  , RDN, LDN Clinical Nutrition

## 2022-07-04 NOTE — Progress Notes (Signed)
PROGRESS NOTE    Ryan Lynch  LYY:[id number] DOB: 1981/01/29 DOA: 07/02/2022 PCP: Patient, No Pcp Per   Brief Narrative: Ryan Lynch is a 41 y.o. male with a history of alcohol use and anxiety. Patient presented secondary to a fall down his stairs and subsequent worsening pain. Upon arrival to the ED, he was found to have evidence of a left tibial fracture and evidence of compartment syndrome, requiring emergent fasciotomy.   Assessment and Plan: Tibial plateau fracture, left Patient admitted to trauma service. S/p emergent fasciotomy. Management per primary.   Compartment syndrome of left lower extremity, initial encounter ([name]) See problem, Tibial plateau fracture, left.  Alcohol use Daily consumption. Alcohol level undetectable on admission. Patient hoping to detox. Started on CIWA on admission. Social work consulted. -Continue CIWA -Librium with taper -Continue thiamine, multivitamin and folic acid  Transaminitis AST/ALT elevated to 188/59 on admission respectively. Secondary to rhabdomyolysis from compartment syndrome and possibly alcohol use. Trended down initially but is slightly worsened.  Thrombocytopenia ([name]) Likely related to trauma and blood loss. Patient with evidence of hepatic steatosis and mild splenomegaly on imaging. No prior labs available. Platelets trended down but appear to have stabilized. -Trend CBC -Watch for bleeding  Adrenal nodule ([name]) Bilateral with a left 16 mm indeterminate left adrenal hypodense mass and a right 10 mm indeterminate right adrenal gland mass. Recommendations for outpatient non-emergent CT or MRI of the adrenal glands.  Compression fracture of T7 vertebra ([name]) -Pain management per primary  Anxiety -Continue Zoloft -Hold Klonopin while on CIWA/Librium taper  Acute hepatic encephalopathy ([name]) Patient with an ammonia of 96 on admission but without significant confusion. He is oriented, but is starting to seem somewhat  confused/slow to respond. Will treat as acute hepatic encephalopathy -Lactulose 10 gm TID  Abdominal distention Increased abdomen distension with decreased bowel sounds. Liquid bowel movements. -Abdominal x-ray to rule in/out post-op ileus  Traumatic rhabdomyolysis ([name]) Secondary to fall down the stairs. Resultant compartment syndrome. CK of 1,513, down to 975 but has increased. IV fluids not running. -Continue LR IV fluids at 150 mL/hr   DVT prophylaxis: Per primary Code Status:   Code Status: Full Code Family Communication: Mother and Father at bedside Disposition Plan: Per primary   Procedures:  External fixation of left lower leg  Antimicrobials: None   Subjective: Patient reports no issues. Per nursing, patient fell overnight trying to get out of his chair.  Objective: BP 116/81 (BP Location: Right Arm)   Pulse (!) 118   Temp 98.4 F (36.9 C) (Oral)   Resp 20   Ht 5\' 11"  (1.803 m)   Wt 91.6 kg   SpO2 96%   BMI 28.17 kg/m   Examination:  General exam: Appears calm and comfortable  Respiratory system: Clear to auscultation. Respiratory effort normal. Cardiovascular system: S1 & S2 heard, RRR. No murmurs, rubs, gallops or clicks. Gastrointestinal system: Abdomen is distended, soft and non-tender. High pitched and decreased bowel sounds heard. Central nervous system: Alert and oriented. Very mild tremor. Musculoskeletal: No calf tenderness. Skin: No cyanosis. No rashes    Data Reviewed: I have personally reviewed following labs and imaging studies  CBC Lab Results  Component Value Date   WBC 5.9 07/04/2022   RBC 2.40 (L) 07/04/2022   HGB 7.1 (L) 07/04/2022   HCT 21.9 (L) 07/04/2022   MCV 91.3 07/04/2022   MCH 29.6 07/04/2022   PLT 82 (L) 07/04/2022   MCHC 32.4 07/04/2022   RDW 15.4 07/04/2022  LYMPHSABS 0.7 07/04/2022   MONOABS 0.4 07/04/2022   EOSABS 0.0 07/04/2022   BASOSABS 0.0 07/04/2022     Last metabolic panel Lab Results  Component  Value Date   NA 134 (L) 07/04/2022   K 3.9 07/04/2022   CL 105 07/04/2022   CO2 22 07/04/2022   BUN 12 07/04/2022   CREATININE 0.83 07/04/2022   GLUCOSE 129 (H) 07/04/2022   GFRNONAA >60 07/04/2022   CALCIUM 8.1 (L) 07/04/2022   PROT 5.3 (L) 07/04/2022   ALBUMIN 2.8 (L) 07/04/2022   BILITOT 2.0 (H) 07/04/2022   ALKPHOS 66 07/04/2022   AST 171 (H) 07/04/2022   ALT 47 (H) 07/04/2022   ANIONGAP 7 07/04/2022    GFR: Estimated Creatinine Clearance: 134.1 mL/min (by C-G formula based on SCr of 0.83 mg/dL).  Recent Results (from the past 240 hour(s))  Resp Panel by RT-PCR (Flu A&B, Covid) Anterior Nasal Swab     Status: None   Collection Time: 07/02/22 10:44 AM   Specimen: Anterior Nasal Swab  Result Value Ref Range Status   SARS Coronavirus 2 by RT PCR NEGATIVE NEGATIVE Final    Comment: (NOTE) SARS-CoV-2 target nucleic acids are NOT DETECTED.  The SARS-CoV-2 RNA is generally detectable in upper respiratory specimens during the acute phase of infection. The lowest concentration of SARS-CoV-2 viral copies this assay can detect is 138 copies/mL. A negative result does not preclude SARS-Cov-2 infection and should not be used as the sole basis for treatment or other patient management decisions. A negative result may occur with  improper specimen collection/handling, submission of specimen other than nasopharyngeal swab, presence of viral mutation(s) within the areas targeted by this assay, and inadequate number of viral copies(<138 copies/mL). A negative result must be combined with clinical observations, patient history, and epidemiological information. The expected result is Negative.  Fact Sheet for Patients:  BloggerCourse.com  Fact Sheet for Healthcare Providers:  SeriousBroker.it  This test is no t yet approved or cleared by the [place] FDA and  has been authorized for detection and/or diagnosis of SARS-CoV-2  by FDA under an Emergency Use Authorization (EUA). This EUA will remain  in effect (meaning this test can be used) for the duration of the COVID-19 declaration under Section 564(b)(1) of the Act, 21 U.S.C.section 360bbb-3(b)(1), unless the authorization is terminated  or revoked sooner.       Influenza A by PCR NEGATIVE NEGATIVE Final   Influenza B by PCR NEGATIVE NEGATIVE Final    Comment: (NOTE) The Xpert Xpress SARS-CoV-2/FLU/RSV plus assay is intended as an aid in the diagnosis of influenza from Nasopharyngeal swab specimens and should not be used as a sole basis for treatment. Nasal washings and aspirates are unacceptable for Xpert Xpress SARS-CoV-2/FLU/RSV testing.  Fact Sheet for Patients: BloggerCourse.com  Fact Sheet for Healthcare Providers: SeriousBroker.it  This test is not yet approved or cleared by the [place] FDA and has been authorized for detection and/or diagnosis of SARS-CoV-2 by FDA under an Emergency Use Authorization (EUA). This EUA will remain in effect (meaning this test can be used) for the duration of the COVID-19 declaration under Section 564(b)(1) of the Act, 21 U.S.C. section 360bbb-3(b)(1), unless the authorization is terminated or revoked.  Performed at [facility], [address] [place], [place] [address]       Radiology Studies: CT KNEE LEFT WO CONTRAST  Result Date: 07/02/2022 CLINICAL DATA:  Knee trauma, tibial plateau fracture. EXAM: CT OF THE LEFT KNEE WITHOUT CONTRAST TECHNIQUE:  Multidetector CT imaging of the left knee was performed according to the standard protocol. Multiplanar CT image reconstructions were also generated. RADIATION DOSE REDUCTION: This exam was performed according to the departmental dose-optimization program which includes automated exposure control, adjustment of the mA and/or kV according to patient size and/or use of iterative  reconstruction technique. COMPARISON:  None Available. FINDINGS: Bones/Joint/Cartilage Severely comminuted fracture of the lateral tibial plateau with approximately 2 cm wedge-shaped depression. The depressed osseous fragment measures at least 2.5 x 1.5 cm. There is also mildly displaced medial malleolar fracture without significant depression. There is a transverse fracture through the proximal tibial metadiaphysis. Large suprapatellar joint effusion with lipohemarthrosis. Ligaments Evaluation of ligaments is limited on CT examination. Muscles and Tendons Edema about the origin of the medial head of the gastrocnemius and soleus, likely sequela of the trauma. No drainable fluid collection or hematoma. Quadriceps and patellar tendon appear intact. Soft tissues Mild soft tissue edema about the knee. IMPRESSION: 1. Comminuted depressed lateral tibial plateau fracture with approximately 2 cm wedge-shaped depression, nondisplaced fracture of the medial tibial plateau and transverse fracture of the tibial metaphysis, findings most consistent with Schatzker type 6 fracture. 2.  Large joint effusion with lipohemarthrosis. 3. Muscle edema and surrounding inflammatory changes of the lateral head of the gastrocnemius and soleus without evidence of intramuscular hematoma or fluid collection. Electronically Signed   By: Larose Hires D.O.   On: 07/02/2022 21:24   DG Hand Complete Right  Result Date: 07/02/2022 CLINICAL DATA:  Bruising on posterior aspect of hand. EXAM: RIGHT HAND - COMPLETE 3+ VIEW COMPARISON:  None Available. FINDINGS: There is no evidence of fracture or dislocation. There is no evidence of arthropathy or other focal bone abnormality. Dorsal soft tissue edema overlies the metacarpals. IMPRESSION: Dorsal soft tissue edema. No fracture or dislocation. Electronically Signed   By: Narda Rutherford M.D.   On: 07/02/2022 19:35   DG Knee Left Port  Result Date: 07/02/2022 CLINICAL DATA:  Postop external fixator  placement. EXAM: PORTABLE LEFT KNEE - 1-2 VIEW COMPARISON:  Preoperative radiographs. FINDINGS: External fixator placement with femoral pain only included on the AP view. The tibial pin is not included in the field of view. Comminuted proximal tibial fracture in slightly improved alignment from preoperative exam. A wound VAC is partially visualized. IMPRESSION: External fixator placement, femoral pain included on the AP view, tibial pins not visualized on the current exam. Comminuted proximal tibial fracture in improved alignment from preoperative exam. Electronically Signed   By: Narda Rutherford M.D.   On: 07/02/2022 19:34   DG Tibia/Fibula Left  Result Date: 07/02/2022 CLINICAL DATA:  External fixation. EXAM: LEFT TIBIA AND FIBULA - 2 VIEW COMPARISON:  Preoperative imaging. FINDINGS: Four fluoroscopic spot views of the left tibia and fibula obtained in the operating room. Two pins are seen in the tibial shaft. Improved alignment of comminuted proximal tibial fracture. Fluoroscopy time 15 seconds. Dose 1.04 mGy. IMPRESSION: Intraoperative fluoroscopy during left tibia and fibula external fixation placement. Electronically Signed   By: Narda Rutherford M.D.   On: 07/02/2022 16:30   DG C-Arm 1-60 Min-No Report  Result Date: 07/02/2022 Fluoroscopy was utilized by the requesting physician.  No radiographic interpretation.      LOS: 2 days    Jacquelin Hawking, MD [facility] 07/04/2022, 12:32 PM   If 7PM-7AM, please contact night-coverage [email or web address]

## 2022-07-05 ENCOUNTER — Encounter (HOSPITAL_COMMUNITY)
Admission: EM | Disposition: A | Discharge status: Rehabilitation Facility | Payer: Self-pay | Source: Home / Self Care | Attending: Orthopedic Surgery

## 2022-07-05 ENCOUNTER — Inpatient Hospital Stay (HOSPITAL_COMMUNITY): Payer: BC Managed Care – PPO

## 2022-07-05 ENCOUNTER — Inpatient Hospital Stay (HOSPITAL_COMMUNITY): Payer: BC Managed Care – PPO | Admitting: Certified Registered Nurse Anesthetist

## 2022-07-05 ENCOUNTER — Encounter (HOSPITAL_COMMUNITY): Payer: Self-pay | Admitting: Orthopedic Surgery

## 2022-07-05 ENCOUNTER — Other Ambulatory Visit: Payer: Self-pay

## 2022-07-05 DIAGNOSIS — F419 Anxiety disorder, unspecified: Secondary | ICD-10-CM | POA: Diagnosis not present

## 2022-07-05 DIAGNOSIS — F10939 Alcohol use, unspecified with withdrawal, unspecified: Secondary | ICD-10-CM

## 2022-07-05 DIAGNOSIS — L409 Psoriasis, unspecified: Secondary | ICD-10-CM

## 2022-07-05 DIAGNOSIS — D696 Thrombocytopenia, unspecified: Secondary | ICD-10-CM | POA: Diagnosis not present

## 2022-07-05 DIAGNOSIS — E278 Other specified disorders of adrenal gland: Secondary | ICD-10-CM | POA: Diagnosis not present

## 2022-07-05 DIAGNOSIS — D649 Anemia, unspecified: Secondary | ICD-10-CM

## 2022-07-05 DIAGNOSIS — Z789 Other specified health status: Secondary | ICD-10-CM | POA: Diagnosis not present

## 2022-07-05 HISTORY — PX: ORIF TIBIA PLATEAU: SHX2132

## 2022-07-05 HISTORY — PX: SECONDARY CLOSURE OF WOUND: SHX6208

## 2022-07-05 HISTORY — PX: APPLICATION OF WOUND VAC: SHX5189

## 2022-07-05 LAB — POCT I-STAT 7, (LYTES, BLD GAS, ICA,H+H)
Acid-base deficit: 1 mmol/L (ref 0.0–2.0)
Bicarbonate: 24.3 mmol/L (ref 20.0–28.0)
Calcium, Ion: 1.09 mmol/L — ABNORMAL LOW (ref 1.15–1.40)
HCT: 23 % — ABNORMAL LOW (ref 39.0–52.0)
Hemoglobin: 7.8 g/dL — ABNORMAL LOW (ref 13.0–17.0)
O2 Saturation: 99 %
Potassium: 3.6 mmol/L (ref 3.5–5.1)
Sodium: 135 mmol/L (ref 135–145)
TCO2: 26 mmol/L (ref 22–32)
pCO2 arterial: 43.6 mmHg (ref 32–48)
pH, Arterial: 7.354 (ref 7.35–7.45)
pO2, Arterial: 152 mmHg — ABNORMAL HIGH (ref 83–108)

## 2022-07-05 LAB — RENAL FUNCTION PANEL
Albumin: 2.7 g/dL — ABNORMAL LOW (ref 3.5–5.0)
Anion gap: 9 (ref 5–15)
BUN: 11 mg/dL (ref 6–20)
CO2: 23 mmol/L (ref 22–32)
Calcium: 7.8 mg/dL — ABNORMAL LOW (ref 8.9–10.3)
Chloride: 105 mmol/L (ref 98–111)
Creatinine, Ser: 0.75 mg/dL (ref 0.61–1.24)
GFR, Estimated: >60 mL/min (ref >60)
Glucose, Bld: 122 mg/dL — ABNORMAL HIGH (ref 70–99)
Phosphorus: 2.6 mg/dL (ref 2.5–4.6)
Potassium: 3.4 mmol/L — ABNORMAL LOW (ref 3.5–5.1)
Sodium: 137 mmol/L (ref 135–145)

## 2022-07-05 LAB — HEPATIC FUNCTION PANEL
ALT: 53 U/L — ABNORMAL HIGH (ref 0–44)
AST: 113 U/L — ABNORMAL HIGH (ref 15–41)
Albumin: 2.7 g/dL — ABNORMAL LOW (ref 3.5–5.0)
Alkaline Phosphatase: 56 U/L (ref 38–126)
Bilirubin, Direct: 0.8 mg/dL — ABNORMAL HIGH (ref 0.0–0.2)
Indirect Bilirubin: 0.8 mg/dL (ref 0.3–0.9)
Total Bilirubin: 1.6 mg/dL — ABNORMAL HIGH (ref 0.3–1.2)
Total Protein: 5.1 g/dL — ABNORMAL LOW (ref 6.5–8.1)

## 2022-07-05 LAB — POCT I-STAT, CHEM 8
BUN: 12 mg/dL (ref 6–20)
Calcium, Ion: 1.22 mmol/L (ref 1.15–1.40)
Chloride: 100 mmol/L (ref 98–111)
Creatinine, Ser: 0.5 mg/dL — ABNORMAL LOW (ref 0.61–1.24)
Glucose, Bld: 127 mg/dL — ABNORMAL HIGH (ref 70–99)
HCT: 29 % — ABNORMAL LOW (ref 39.0–52.0)
Hemoglobin: 9.9 g/dL — ABNORMAL LOW (ref 13.0–17.0)
Potassium: 3.6 mmol/L (ref 3.5–5.1)
Sodium: 136 mmol/L (ref 135–145)
TCO2: 24 mmol/L (ref 22–32)

## 2022-07-05 LAB — CBC
HCT: 27 % — ABNORMAL LOW (ref 39.0–52.0)
Hemoglobin: 9.2 g/dL — ABNORMAL LOW (ref 13.0–17.0)
MCH: 29.1 pg (ref 26.0–34.0)
MCHC: 34.1 g/dL (ref 30.0–36.0)
MCV: 85.4 fL (ref 80.0–100.0)
Platelets: 113 10*3/uL — ABNORMAL LOW (ref 150–400)
RBC: 3.16 MIL/uL — ABNORMAL LOW (ref 4.22–5.81)
RDW: 16.9 % — ABNORMAL HIGH (ref 11.5–15.5)
WBC: 6.4 10*3/uL (ref 4.0–10.5)
nRBC: 0.5 % — ABNORMAL HIGH (ref 0.0–0.2)

## 2022-07-05 LAB — AMMONIA: Ammonia: 47 umol/L — ABNORMAL HIGH (ref 9–35)

## 2022-07-05 LAB — CK: Total CK: 362 U/L (ref 49–397)

## 2022-07-05 LAB — PREPARE RBC (CROSSMATCH)

## 2022-07-05 SURGERY — OPEN REDUCTION INTERNAL FIXATION (ORIF) TIBIAL PLATEAU
Anesthesia: General | Site: Leg Lower | Laterality: Left

## 2022-07-05 MED ORDER — SUGAMMADEX SODIUM 200 MG/2ML IV SOLN
INTRAVENOUS | Status: DC | PRN
  Administered 2022-07-05 (×2): 100 mg via INTRAVENOUS

## 2022-07-05 MED ORDER — LACTATED RINGERS IV SOLN: INTRAVENOUS | Status: DC | PRN

## 2022-07-05 MED ORDER — DEXAMETHASONE SODIUM PHOSPHATE 10 MG/ML IJ SOLN
INTRAMUSCULAR | Status: AC
  Filled 2022-07-05: qty 1

## 2022-07-05 MED ORDER — CEFAZOLIN SODIUM-DEXTROSE 2-4 GM/100ML-% IV SOLN
2.0000 g | Freq: Three times a day (TID) | INTRAVENOUS | Status: AC
  Administered 2022-07-05 – 2022-07-06 (×3): 2 g via INTRAVENOUS
  Filled 2022-07-05 (×3): qty 100

## 2022-07-05 MED ORDER — TRANEXAMIC ACID-NACL 1000-0.7 MG/100ML-% IV SOLN
INTRAVENOUS | Status: DC | PRN
  Administered 2022-07-05: 1000 mg via INTRAVENOUS

## 2022-07-05 MED ORDER — SUCCINYLCHOLINE CHLORIDE 200 MG/10ML IV SOSY
PREFILLED_SYRINGE | INTRAVENOUS | Status: AC
  Filled 2022-07-05: qty 10

## 2022-07-05 MED ORDER — ROCURONIUM BROMIDE 10 MG/ML (PF) SYRINGE
PREFILLED_SYRINGE | INTRAVENOUS | Status: AC
  Filled 2022-07-05: qty 10

## 2022-07-05 MED ORDER — TRANEXAMIC ACID-NACL 1000-0.7 MG/100ML-% IV SOLN
INTRAVENOUS | Status: AC
  Filled 2022-07-05: qty 100

## 2022-07-05 MED ORDER — SODIUM CHLORIDE 0.9 % IV SOLN: INTRAVENOUS | Status: DC | PRN

## 2022-07-05 MED ORDER — ESMOLOL HCL 100 MG/10ML IV SOLN
INTRAVENOUS | Status: DC | PRN
  Administered 2022-07-05: 30 mg via INTRAVENOUS

## 2022-07-05 MED ORDER — LORAZEPAM 2 MG/ML IJ SOLN
1.0000 mg | INTRAMUSCULAR | Status: AC | PRN
  Administered 2022-07-05: 3 mg via INTRAVENOUS
  Administered 2022-07-05 – 2022-07-08 (×3): 2 mg via INTRAVENOUS
  Filled 2022-07-05 (×2): qty 1
  Filled 2022-07-05 (×2): qty 2
  Filled 2022-07-05 (×3): qty 1
  Filled 2022-07-05 (×2): qty 2

## 2022-07-05 MED ORDER — FENTANYL CITRATE (PF) 250 MCG/5ML IJ SOLN
INTRAMUSCULAR | Status: AC
  Filled 2022-07-05: qty 5

## 2022-07-05 MED ORDER — ONDANSETRON HCL 4 MG/2ML IJ SOLN
INTRAMUSCULAR | Status: AC
  Filled 2022-07-05: qty 2

## 2022-07-05 MED ORDER — LIDOCAINE 2% (20 MG/ML) 5 ML SYRINGE
INTRAMUSCULAR | Status: AC
  Filled 2022-07-05: qty 5

## 2022-07-05 MED ORDER — ALBUMIN HUMAN 5 % IV SOLN: INTRAVENOUS | Status: DC | PRN

## 2022-07-05 MED ORDER — ESMOLOL HCL 100 MG/10ML IV SOLN
INTRAVENOUS | Status: AC
  Filled 2022-07-05: qty 10

## 2022-07-05 MED ORDER — PROPOFOL 10 MG/ML IV BOLUS
INTRAVENOUS | Status: DC | PRN
  Administered 2022-07-05: 90 mg via INTRAVENOUS

## 2022-07-05 MED ORDER — MIDAZOLAM HCL 2 MG/2ML IJ SOLN
INTRAMUSCULAR | Status: AC
  Filled 2022-07-05: qty 2

## 2022-07-05 MED ORDER — LORAZEPAM 2 MG/ML IJ SOLN
0.0000 mg | Freq: Three times a day (TID) | INTRAMUSCULAR | Status: DC
  Administered 2022-07-07 – 2022-07-08 (×3): 2 mg via INTRAVENOUS
  Filled 2022-07-05 (×3): qty 1

## 2022-07-05 MED ORDER — FENTANYL CITRATE (PF) 100 MCG/2ML IJ SOLN
25.0000 ug | INTRAMUSCULAR | Status: DC | PRN
  Administered 2022-07-05 (×2): 25 ug via INTRAVENOUS

## 2022-07-05 MED ORDER — FENTANYL CITRATE (PF) 100 MCG/2ML IJ SOLN
INTRAMUSCULAR | Status: AC
  Filled 2022-07-05: qty 2

## 2022-07-05 MED ORDER — SODIUM CHLORIDE 0.9% IV SOLUTION: Freq: Once | INTRAVENOUS | Status: DC

## 2022-07-05 MED ORDER — PROPOFOL 10 MG/ML IV BOLUS
INTRAVENOUS | Status: AC
  Filled 2022-07-05: qty 20

## 2022-07-05 MED ORDER — LORAZEPAM 1 MG PO TABS: 1.0000 mg | ORAL_TABLET | ORAL | Status: AC | PRN

## 2022-07-05 MED ORDER — ONDANSETRON HCL 4 MG/2ML IJ SOLN
INTRAMUSCULAR | Status: DC | PRN
  Administered 2022-07-05: 4 mg via INTRAVENOUS

## 2022-07-05 MED ORDER — MIDAZOLAM HCL 2 MG/2ML IJ SOLN
INTRAMUSCULAR | Status: DC | PRN
  Administered 2022-07-05: 1 mg via INTRAVENOUS

## 2022-07-05 MED ORDER — ROCURONIUM BROMIDE 10 MG/ML (PF) SYRINGE
PREFILLED_SYRINGE | INTRAVENOUS | Status: DC | PRN
  Administered 2022-07-05 (×2): 20 mg via INTRAVENOUS
  Administered 2022-07-05: 60 mg via INTRAVENOUS
  Administered 2022-07-05: 20 mg via INTRAVENOUS

## 2022-07-05 MED ORDER — SUCCINYLCHOLINE CHLORIDE 200 MG/10ML IV SOSY
PREFILLED_SYRINGE | INTRAVENOUS | Status: DC | PRN
  Administered 2022-07-05: 200 mg via INTRAVENOUS

## 2022-07-05 MED ORDER — DEXAMETHASONE SODIUM PHOSPHATE 10 MG/ML IJ SOLN
INTRAMUSCULAR | Status: DC | PRN
  Administered 2022-07-05: 5 mg via INTRAVENOUS

## 2022-07-05 MED ORDER — 0.9 % SODIUM CHLORIDE (POUR BTL) OPTIME
TOPICAL | Status: DC | PRN
  Administered 2022-07-05 (×2): 1000 mL

## 2022-07-05 MED ORDER — FENTANYL CITRATE (PF) 250 MCG/5ML IJ SOLN
INTRAMUSCULAR | Status: DC | PRN
  Administered 2022-07-05 (×6): 50 ug via INTRAVENOUS

## 2022-07-05 MED ORDER — SODIUM CHLORIDE (PF) 0.9 % IJ SOLN
INTRAMUSCULAR | Status: AC
  Filled 2022-07-05: qty 10

## 2022-07-05 MED ORDER — LORAZEPAM 2 MG/ML IJ SOLN
0.0000 mg | INTRAMUSCULAR | Status: DC
  Administered 2022-07-05 – 2022-07-06 (×6): 2 mg via INTRAVENOUS
  Administered 2022-07-07: 3 mg via INTRAVENOUS
  Filled 2022-07-05 (×4): qty 1

## 2022-07-05 SURGICAL SUPPLY — 110 items
ABDOMINAL PAD ABD IMPLANT
BAG COUNTER SPONGE SURGICOUNT (BAG) ×1 IMPLANT
BAG SPNG CNTER NS LX DISP (BAG) ×1
BANDAGE ACE 4 STERILE IMPLANT
BANDAGE ACE 6X5 VEL STRL LF (GAUZE/BANDAGES/DRESSINGS) IMPLANT
BANDAGE ESMARK 6X9 LF (GAUZE/BANDAGES/DRESSINGS) ×1 IMPLANT
BIT DRILL CAL (BIT) IMPLANT
BIT DRILL CALIBRATED 2.7 (BIT) IMPLANT
BLADE CLIPPER SURG (BLADE) IMPLANT
BLADE SURG 10 STRL SS (BLADE) ×1 IMPLANT
BLADE SURG 15 STRL LF DISP TIS (BLADE) ×1 IMPLANT
BLADE SURG 15 STRL SS (BLADE)
BNDG CMPR 9X6 STRL LF SNTH (GAUZE/BANDAGES/DRESSINGS) ×1
BNDG COHESIVE 4X5 TAN STRL (GAUZE/BANDAGES/DRESSINGS) ×1 IMPLANT
BNDG ELASTIC 4X5.8 VLCR STR LF (GAUZE/BANDAGES/DRESSINGS) ×1 IMPLANT
BNDG ELASTIC 6X5.8 VLCR STR LF (GAUZE/BANDAGES/DRESSINGS) ×1 IMPLANT
BNDG ESMARK 6X9 LF (GAUZE/BANDAGES/DRESSINGS) ×1
BNDG GAUZE ELAST 4 BULKY (GAUZE/BANDAGES/DRESSINGS) ×5 IMPLANT
BONE CANC CHIPS 40CC CAN1/2 (Bone Implant) ×1 IMPLANT
BRUSH SCRUB EZ PLAIN DRY (MISCELLANEOUS) ×2 IMPLANT
CANISTER SUCT 3000ML PPV (MISCELLANEOUS) ×1 IMPLANT
CANISTER WOUNDNEG PRESSURE 500 (CANNISTER) IMPLANT
CHIPS CANC BONE 40CC CAN1/2 (Bone Implant) ×1 IMPLANT
COVER SURGICAL LIGHT HANDLE (MISCELLANEOUS) ×2 IMPLANT
CUFF TOURN SGL QUICK 34 (TOURNIQUET CUFF)
CUFF TRNQT CYL 34X4.125X (TOURNIQUET CUFF) ×1 IMPLANT
DRAPE C-ARM 42X72 X-RAY (DRAPES) ×1 IMPLANT
DRAPE C-ARMOR (DRAPES) ×1 IMPLANT
DRAPE HALF SHEET 40X57 (DRAPES) IMPLANT
DRAPE INCISE IOBAN 66X45 STRL (DRAPES) ×1 IMPLANT
DRAPE U-SHAPE 47X51 STRL (DRAPES) ×1 IMPLANT
DRILL BIT CAL (BIT) ×1
DRSG ADAPTIC 3X8 NADH LF (GAUZE/BANDAGES/DRESSINGS) ×1 IMPLANT
DRSG PAD ABDOMINAL 8X10 ST (GAUZE/BANDAGES/DRESSINGS) ×2 IMPLANT
DRSG VAC ATS MED SENSATRAC (GAUZE/BANDAGES/DRESSINGS) IMPLANT
ELECT CAUTERY BLADE 6.4 (BLADE) IMPLANT
ELECT REM PT RETURN 9FT ADLT (ELECTROSURGICAL) ×1
ELECTRODE REM PT RTRN 9FT ADLT (ELECTROSURGICAL) ×1 IMPLANT
GAUZE SPONGE 4X4 12PLY STRL (GAUZE/BANDAGES/DRESSINGS) ×1 IMPLANT
GLOVE BIO SURGEON STRL SZ7.5 (GLOVE) ×1 IMPLANT
GLOVE BIO SURGEON STRL SZ8 (GLOVE) ×1 IMPLANT
GLOVE BIOGEL PI IND STRL 7.5 (GLOVE) ×1 IMPLANT
GLOVE BIOGEL PI IND STRL 8 (GLOVE) ×1 IMPLANT
GLOVE BIOGEL PI INDICATOR 7.5 (GLOVE) ×1
GLOVE BIOGEL PI INDICATOR 8 (GLOVE) ×1
GLOVE SURG ORTHO LTX SZ7.5 (GLOVE) ×2 IMPLANT
GOWN STRL REUS W/ TWL LRG LVL3 (GOWN DISPOSABLE) ×2 IMPLANT
GOWN STRL REUS W/ TWL XL LVL3 (GOWN DISPOSABLE) ×1 IMPLANT
GOWN STRL REUS W/TWL LRG LVL3 (GOWN DISPOSABLE) ×2
GOWN STRL REUS W/TWL XL LVL3 (GOWN DISPOSABLE) ×1
GRAFT BNE CHIP CANC 1-8 40 (Bone Implant) IMPLANT
HANDPIECE INTERPULSE COAX TIP (DISPOSABLE)
IMMOBILIZER KNEE 22 UNIV (SOFTGOODS) ×1 IMPLANT
K-WIRE ACE 1.6X6 (WIRE) ×8
KIT BASIN OR (CUSTOM PROCEDURE TRAY) ×1 IMPLANT
KIT TURNOVER KIT B (KITS) ×1 IMPLANT
KWIRE ACE 1.6X6 (WIRE) IMPLANT
NDL 1/2 CIR CATGUT .05X1.09 (NEEDLE) IMPLANT
NDL SUT 6 .5 CRC .975X.05 MAYO (NEEDLE) IMPLANT
NEEDLE 1/2 CIR CATGUT .05X1.09 (NEEDLE) ×1 IMPLANT
NEEDLE MAYO TAPER (NEEDLE)
NS IRRIG 1000ML POUR BTL (IV SOLUTION) ×1 IMPLANT
PACK ORTHO EXTREMITY (CUSTOM PROCEDURE TRAY) ×1 IMPLANT
PAD ARMBOARD 7.5X6 YLW CONV (MISCELLANEOUS) ×2 IMPLANT
PAD CAST 4YDX4 CTTN HI CHSV (CAST SUPPLIES) ×1 IMPLANT
PAD CAST CTTN 3X4 STRL (SOFTGOODS) IMPLANT
PADDING CAST COTTON 3X4 STRL (SOFTGOODS) ×1
PADDING CAST COTTON 4X4 STRL (CAST SUPPLIES)
PADDING CAST COTTON 6X4 ST (SOFTGOODS) IMPLANT
PADDING CAST COTTON 6X4 STRL (CAST SUPPLIES) ×1 IMPLANT
PLATE LOCK 7H STD LT PROX TIB (Plate) IMPLANT
SCREW CORT FT 32X3.5XNONLOCK (Screw) IMPLANT
SCREW CORTICAL 3.5MM  32MM (Screw) ×2 IMPLANT
SCREW CORTICAL 3.5MM  34MM (Screw) ×1 IMPLANT
SCREW CORTICAL 3.5MM 32MM (Screw) ×2 IMPLANT
SCREW CORTICAL 3.5MM 34MM (Screw) IMPLANT
SCREW LOCK CORT STAR 3.5X65 (Screw) IMPLANT
SCREW LOCK CORT STAR 3.5X70 (Screw) IMPLANT
SCREW LOCK CORT STAR 3.5X75 (Screw) IMPLANT
SCREW LOCK CORT STAR 3.5X80 (Screw) IMPLANT
SCREW LP 3.5X70MM (Screw) IMPLANT
SCREW LP 3.5X75MM (Screw) IMPLANT
SET HNDPC FAN SPRY TIP SCT (DISPOSABLE) IMPLANT
SPONGE T-LAP 18X18 ~~LOC~~+RFID (SPONGE) ×1 IMPLANT
STAPLER VISISTAT 35W (STAPLE) ×1 IMPLANT
STOCKINETTE IMPERVIOUS 9X36 MD (GAUZE/BANDAGES/DRESSINGS) ×1 IMPLANT
STOCKINETTE IMPERVIOUS LG (DRAPES) ×1 IMPLANT
SUCTION FRAZIER HANDLE 10FR (MISCELLANEOUS) ×1
SUCTION TUBE FRAZIER 10FR DISP (MISCELLANEOUS) ×1 IMPLANT
SUT ETHILON 1 TP 1 60 (SUTURE) ×2 IMPLANT
SUT ETHILON 2 0 FS 18 (SUTURE) IMPLANT
SUT ETHILON 2 0 PSLX (SUTURE) IMPLANT
SUT PDS AB 2-0 CT1 27 (SUTURE) IMPLANT
SUT PROLENE 0 CT (SUTURE) IMPLANT
SUT PROLENE 0 CT 2 (SUTURE) ×2 IMPLANT
SUT VIC AB 0 CT1 27 (SUTURE)
SUT VIC AB 0 CT1 27XBRD ANBCTR (SUTURE) ×1 IMPLANT
SUT VIC AB 1 CT1 27 (SUTURE)
SUT VIC AB 1 CT1 27XBRD ANBCTR (SUTURE) ×1 IMPLANT
SUT VIC AB 2-0 CT1 27 (SUTURE)
SUT VIC AB 2-0 CT1 TAPERPNT 27 (SUTURE) ×2 IMPLANT
SWAB CULTURE ESWAB REG 1ML (MISCELLANEOUS) IMPLANT
SYR 5ML LUER SLIP (SYRINGE) IMPLANT
TOWEL GREEN STERILE (TOWEL DISPOSABLE) ×2 IMPLANT
TOWEL GREEN STERILE FF (TOWEL DISPOSABLE) ×1 IMPLANT
TRAY FOLEY MTR SLVR 16FR STAT (SET/KITS/TRAYS/PACK) IMPLANT
TUBE CONNECTING 12X1/4 (SUCTIONS) ×1 IMPLANT
UNDERPAD 30X36 HEAVY ABSORB (UNDERPADS AND DIAPERS) ×1 IMPLANT
WATER STERILE IRR 1000ML POUR (IV SOLUTION) ×2 IMPLANT
YANKAUER SUCT BULB TIP NO VENT (SUCTIONS) ×1 IMPLANT

## 2022-07-05 NOTE — Progress Notes (Signed)
PROGRESS NOTE    Ryan Lynch  EPP:[id number] DOB: 02-10-1981 DOA: 07/02/2022 PCP: Patient, No Pcp Per   Brief Narrative: Ryan Lynch is a 41 y.o. male with a history of alcohol use and anxiety. Patient presented secondary to a fall down his stairs and subsequent worsening pain. Upon arrival to the ED, he was found to have evidence of a left tibial fracture and evidence of compartment syndrome, requiring emergent fasciotomy. Patient has developed moderate alcohol withdrawal symptoms.   Assessment and Plan: Alcohol withdrawal ([name]) Patient with worsening withdrawal symptoms. Patient initially managed on CIWA with Librium taper. -Switch to Stepdown CIWA protocal -Discontinue Librium -Continue thiamine, multivitamin and folic acid  Thrombocytopenia ([name]) Likely related to trauma and blood loss. Patient with evidence of hepatic steatosis and mild splenomegaly on imaging. No prior labs available. Platelets trended down but appear to have stabilized. -Trend CBC -Watch for bleeding  Tibial plateau fracture, left Patient admitted to trauma service. S/p emergent fasciotomy. Management per primary.   Compartment syndrome of left lower extremity, initial encounter ([name]) See problem, Tibial plateau fracture, left.  Alcohol use Daily consumption. Alcohol level undetectable on admission. Patient hoping to detox. Started on CIWA on admission. Social work consulted.  Transaminitis AST/ALT elevated to 188/59 on admission respectively. Secondary to rhabdomyolysis from compartment syndrome and possibly alcohol use. Trended down initially but is slightly worsened.  Adrenal nodule ([name]) Bilateral with a left 16 mm indeterminate left adrenal hypodense mass and a right 10 mm indeterminate right adrenal gland mass. Recommendations for outpatient non-emergent CT or MRI of the adrenal glands.  Compression fracture of T7 vertebra ([name]) -Pain management per primary  Anxiety -Continue Zoloft -Hold  Klonopin while on CIWA/Librium taper  Psoriasis Noted on exam and now notice in history. Not on medication therapy per medication history.  Acute anemia Blood loss secondary to fractures and surgery. Hemoglobin of 13.3 on day of discharge, down to 8 and then down to 7.1. Patient received 2 units of PRBC on 2. CBC pending today  Acute hepatic encephalopathy [facility]) Patient with an ammonia of 96 on admission but without significant confusion. He is oriented, but is starting to seem somewhat confused/slow to respond. Likely multifactorial with alcohol withdrawal. Lactulose not started secondary to ileus.  Ileus ([name]) Likely multifactorial in setting of surgery and narcotics. No abdominal pain or emesis. NG tube placed (patient removed and new one placed). -NPO; continue NG tube w/ intermittent suction -Abdominal X-ray in AM -Monitor for bowel function -IV fluids  Traumatic rhabdomyolysis ([name]) Secondary to fall down the stairs. Resultant compartment syndrome. CK of 1,513, down to 975 but has increased. CK still pending this morning-Continue LR IV fluids at 150 mL/hr -Follow-up CK -Continue IV fluids   DVT prophylaxis: Per primary Code Status:   Code Status: Full Code Family Communication: None at bedside Disposition Plan: Per primary   Procedures:  External fixation of left lower leg  Antimicrobials: None   Subjective: Patient reports no continued issues. No abdominal pain. Per nursing reports, patient was agitated overnight. Pulled out first NG tube.  Objective: BP (!) 146/92   Pulse (!) 110   Temp 99.1 F (37.3 C) (Axillary)   Resp 17   Ht 5\' 11"  (1.803 m)   Wt 91.6 kg   SpO2 97%   BMI 28.17 kg/m   Examination:  General exam: Appears calm and comfortable Respiratory system: Clear to auscultation. Respiratory effort normal. Cardiovascular system: S1 & S2 heard. Tachycardia. No murmurs. Gastrointestinal system: Abdomen is distended,  soft and nontender. Decreased  bowel sounds heard. Central nervous system: Alert and oriented. Musculoskeletal: No edema. No calf tenderness Skin: multiple nummular scaly plaque lesions on abdomen and LE.   Data Reviewed: I have personally reviewed following labs and imaging studies  CBC Lab Results  Component Value Date   WBC 5.9 07/04/2022   RBC 2.40 (L) 07/04/2022   HGB 7.1 (L) 07/04/2022   HCT 21.9 (L) 07/04/2022   MCV 91.3 07/04/2022   MCH 29.6 07/04/2022   PLT 82 (L) 07/04/2022   MCHC 32.4 07/04/2022   RDW 15.4 07/04/2022   LYMPHSABS 0.7 07/04/2022   MONOABS 0.4 07/04/2022   EOSABS 0.0 07/04/2022   BASOSABS 0.0 07/04/2022     Last metabolic panel Lab Results  Component Value Date   NA 134 (L) 07/04/2022   K 3.9 07/04/2022   CL 105 07/04/2022   CO2 22 07/04/2022   BUN 12 07/04/2022   CREATININE 0.83 07/04/2022   GLUCOSE 129 (H) 07/04/2022   GFRNONAA >60 07/04/2022   CALCIUM 8.1 (L) 07/04/2022   PROT 5.3 (L) 07/04/2022   ALBUMIN 2.8 (L) 07/04/2022   BILITOT 2.0 (H) 07/04/2022   ALKPHOS 66 07/04/2022   AST 171 (H) 07/04/2022   ALT 47 (H) 07/04/2022   ANIONGAP 7 07/04/2022    GFR: Estimated Creatinine Clearance: 134.1 mL/min (by C-G formula based on SCr of 0.83 mg/dL).  Recent Results (from the past 240 hour(s))  Resp Panel by RT-PCR (Flu A&B, Covid) Anterior Nasal Swab     Status: None   Collection Time: 07/02/22 10:44 AM   Specimen: Anterior Nasal Swab  Result Value Ref Range Status   SARS Coronavirus 2 by RT PCR NEGATIVE NEGATIVE Final    Comment: (NOTE) SARS-CoV-2 target nucleic acids are NOT DETECTED.  The SARS-CoV-2 RNA is generally detectable in upper respiratory specimens during the acute phase of infection. The lowest concentration of SARS-CoV-2 viral copies this assay can detect is 138 copies/mL. A negative result does not preclude SARS-Cov-2 infection and should not be used as the sole basis for treatment or other patient management decisions. A negative result may  occur with  improper specimen collection/handling, submission of specimen other than nasopharyngeal swab, presence of viral mutation(s) within the areas targeted by this assay, and inadequate number of viral copies(<138 copies/mL). A negative result must be combined with clinical observations, patient history, and epidemiological information. The expected result is Negative.  Fact Sheet for Patients:  BloggerCourse.com  Fact Sheet for Healthcare Providers:  SeriousBroker.it  This test is no t yet approved or cleared by the [place] FDA and  has been authorized for detection and/or diagnosis of SARS-CoV-2 by FDA under an Emergency Use Authorization (EUA). This EUA will remain  in effect (meaning this test can be used) for the duration of the COVID-19 declaration under Section 564(b)(1) of the Act, 21 U.S.C.section 360bbb-3(b)(1), unless the authorization is terminated  or revoked sooner.       Influenza A by PCR NEGATIVE NEGATIVE Final   Influenza B by PCR NEGATIVE NEGATIVE Final    Comment: (NOTE) The Xpert Xpress SARS-CoV-2/FLU/RSV plus assay is intended as an aid in the diagnosis of influenza from Nasopharyngeal swab specimens and should not be used as a sole basis for treatment. Nasal washings and aspirates are unacceptable for Xpert Xpress SARS-CoV-2/FLU/RSV testing.  Fact Sheet for Patients: BloggerCourse.com  Fact Sheet for Healthcare Providers: SeriousBroker.it  This test is not yet approved or cleared by the [place] and  has been authorized for detection and/or diagnosis of SARS-CoV-2 by FDA under an Emergency Use Authorization (EUA). This EUA will remain in effect (meaning this test can be used) for the duration of the COVID-19 declaration under Section 564(b)(1) of the Act, 21 U.S.C. section 360bbb-3(b)(1), unless the authorization is terminated  or revoked.  Performed at [facility], [address] [place], [place] [address]       Radiology Studies: DG Abd Portable 1V  Result Date: 07/05/2022 CLINICAL DATA:  41 year old male status post advancement of nasogastric tube. EXAM: PORTABLE ABDOMEN - 1 VIEW COMPARISON:  Abdominal radiographs 07/05/2022. FINDINGS: Nasogastric tube has been advanced slightly, now with side port approximately 8 cm distal to the gastroesophageal junction. Mild gaseous distention of the stomach. Persistent gaseous distension throughout the small bowel, with small bowel loops measuring up to 4.5 cm in the central abdomen. A small amount of gas is also noted in the colon. IMPRESSION: 1. Support apparatus, as above. 2. Abnormal bowel-gas pattern, most suggestive of ileus based on comparison with prior CT 07/04/2022. Electronically Signed   By: Trudie Reed M.D.   On: 07/05/2022 07:01   DG Abd Portable 1V  Result Date: 07/05/2022 CLINICAL DATA:  Nasogastric tube placement EXAM: PORTABLE ABDOMEN - 1 VIEW COMPARISON:  Abdominal CT from yesterday FINDINGS: Enteric tube with tip at the upper stomach and side-port over the lower esophagus. 7 cm advancement needed to place the side port at the stomach. Diffuse gaseous distension of bowel as seen on prior CT. No concerning mass effect or gas collection. Right upper quadrant calcification. IMPRESSION: 1. Enteric tube with tip at the stomach and side-port over the lower esophagus. Need 7 cm of advancement to place the side port at the upper stomach. 2. Ileus pattern. Electronically Signed   By: Tiburcio Pea M.D.   On: 07/05/2022 06:00   DG CHEST PORT 1 VIEW  Result Date: 07/05/2022 CLINICAL DATA:  [id number].  OG tube placement. EXAM: PORTABLE CHEST 1 VIEW COMPARISON:  Chest CT 07/02/2022 FINDINGS: 11:54 p.m. Interval enteric tube insertion. The tip is not included in the field but probably abuts the far wall of the body of the stomach based on the  positioning of the proximal side hole. The lungs are expiratory. There is increased bilateral streaky atelectasis or infiltrate in the infrahilar areas. The remaining hypoexpanded lungs are generally clear. No pleural effusion is seen. The cardiomediastinal silhouette and central vasculature are stable. IMPRESSION: 1. Enteric tube is adequately intragastric, tip abutting the far wall of the body of stomach. 2. Expiratory exam with increased bilateral infrahilar opacities consistent with atelectasis or pneumonia. Electronically Signed   By: Almira Bar M.D.   On: 07/05/2022 00:09   CT ABDOMEN PELVIS WO CONTRAST  Result Date: 07/04/2022 CLINICAL DATA:  Bowel obstruction suspected. EXAM: CT ABDOMEN AND PELVIS WITHOUT CONTRAST TECHNIQUE: Multidetector CT imaging of the abdomen and pelvis was performed following the standard protocol without IV contrast. RADIATION DOSE REDUCTION: This exam was performed according to the departmental dose-optimization program which includes automated exposure control, adjustment of the mA and/or kV according to patient size and/or use of iterative reconstruction technique. COMPARISON:  Radiographs same date.  Abdominopelvic CT 07/02/2022 FINDINGS: Lower chest: Interval increased dependent atelectasis at both lung bases. No significant pleural or pericardial effusion. No basilar pneumothorax. Hepatobiliary: Hepatic steatosis without evidence of focal abnormality or acute injury on noncontrast imaging. Unchanged atypical appearance of the gallbladder which appears decompressed with a possible stone in the fundal  region. There are small calcifications more proximally within the gallbladder wall. No surrounding inflammatory changes or biliary dilatation. Pancreas: Unremarkable. No pancreatic ductal dilatation or surrounding inflammatory changes. Spleen: Measures 13.7 x 7.6 x 13.5 cm (volume = 740 cm^3), consistent with mild splenomegaly, unchanged. No focal abnormality or surrounding  fluid collection on noncontrast imaging. Adrenals/Urinary Tract: Small low-density adrenal nodules bilaterally, measuring 1.3 cm and 9 HU on the right (image 27/3) and 1.5 cm and -2 HU on the left (image 31/3). These are consistent with benign adenomas, likely incidental. Tiny nonobstructing calculus in the lower pole of the right kidney. No evidence of ureteral calculus or hydronephrosis. The bladder appears unremarkable for its degree of distention. Stomach/Bowel: Enteric contrast was administered and has passed into the proximal small bowel. The stomach is not significantly distended. There is moderate diffuse small bowel and colonic dilatation without focal transition point, wall thickening or surrounding inflammation. The appendix appears normal. Vascular/Lymphatic: There are no enlarged abdominal or pelvic lymph nodes. No significant vascular findings on noncontrast imaging. No evidence of retroperitoneal hematoma. Reproductive: The prostate gland and seminal vesicles appear unremarkable. Other: Trace ascites in the right lower quadrant and left pelvis. No focal extraluminal fluid collection. No pneumoperitoneum. Musculoskeletal: No acute or significant osseous findings in the abdomen or pelvis. Mild T7 superior endplate compression deformity again noted. IMPRESSION: 1. Interval development of diffuse small and large bowel distension in a pattern most consistent with an ileus. Recommend radiographic follow-up. Patient may benefit from nasogastric tube decompression of the stomach. 2. Small amount of ascites in the right lower quadrant and left pelvis of undetermined etiology. No evidence of contrast leak, free air or drainable fluid collection. 3. Stable atypical appearance of the gallbladder which may be secondary to gallstones and/or gallbladder wall calcification. 4. Previously demonstrated small adrenal nodules measure near water density, consistent with adenomas, likely incidental. Electronically Signed    By: Carey Bullocks M.D.   On: 07/04/2022 19:34   DG Abd 1 View  Result Date: 07/04/2022 CLINICAL DATA:  Decreased bowel sounds EXAM: ABDOMEN - 1 VIEW COMPARISON:  07/02/2022 FINDINGS: Diffusely dilated air-filled loops of both large and small bowel throughout the abdomen. No gross free intraperitoneal air. No acute bony findings. IMPRESSION: Diffusely dilated air-filled loops of both large and small bowel throughout the abdomen. Findings are most suggestive of ileus. Obstruction not excluded. Continued follow-up recommended. Electronically Signed   By: Duanne Guess D.O.   On: 07/04/2022 13:04      LOS: 3 days    Jacquelin Hawking, MD [facility] 07/05/2022, 7:52 AM   If 7PM-7AM, please contact night-coverage [email or web address]

## 2022-07-05 NOTE — Progress Notes (Signed)
Pt transferred to 4NP-03 from PACU. Pt A&O x 4 with drowsiness. Denies pain at this time. Knee immobilizer in place to L leg. Pt able to wiggle toes, cap refill less than 3 with full sensation. NG tube connected to low int suction drainage green. Lung sounds clear bilat upper/lower. Pt noted to have mitts on bilat hands but does not have any restraints placed at time of admission to unit. This RN will d/c order per PACU nurse pt has been without restraints since surgery. Pt oriented to unit. CB within reach, bed in lowest position, bed alarm on.

## 2022-07-05 NOTE — Assessment & Plan Note (Addendum)
Librium discontinued  Currently on ativan per ciwa, renewed - continues to score rather highly, I suspect there are other contributing factors to his persistent delirium and elevated CIWA scores - is chronically on benzos/alcohol, so complicated withdrawal not unexpected either High dose thiamine  multivitamin and folic acid Will have low threshold to discuss with PCCM regarding precedex

## 2022-07-05 NOTE — Anesthesia Procedure Notes (Signed)
Arterial Line Insertion Start/End8/22/2023 11:27 AM Performed by: Audie Pinto, CRNA, CRNA  Patient location: OR. Patient sedated Left, radial was placed Catheter size: 20 G Hand hygiene performed   Attempts: 1 Procedure performed without using ultrasound guided technique. Following insertion, dressing applied and Biopatch. Post procedure assessment: unchanged  Patient tolerated the procedure well with no immediate complications.

## 2022-07-05 NOTE — Assessment & Plan Note (Signed)
Noted on exam and now notice in history. Not on medication therapy per medication history.

## 2022-07-05 NOTE — Progress Notes (Addendum)
Insert new NG tube.  Waiting on STAT chest xray.  Pt tolerated well.  Pt given education on why tube is needed to help decompress his stomach.  Pt is okay with having it in.    0620   NG tube increased 7cm as noted in chest xray.  Reached out to on call provider to update on pt.  Waiting for call back.

## 2022-07-05 NOTE — Anesthesia Procedure Notes (Signed)
Procedure Name: Intubation Date/Time: 07/05/2022 8:55 AM  Performed by: Janene Harvey, CRNAPre-anesthesia Checklist: Patient identified, Emergency Drugs available, Suction available and Patient being monitored Patient Re-evaluated:Patient Re-evaluated prior to induction Oxygen Delivery Method: Circle system utilized Preoxygenation: Pre-oxygenation with 100% oxygen Induction Type: IV induction, Rapid sequence and Cricoid Pressure applied Laryngoscope Size: Mac and 4 Grade View: Grade I Tube type: Oral Tube size: 7.5 mm Number of attempts: 1 Airway Equipment and Method: Stylet and Oral airway Placement Confirmation: ETT inserted through vocal cords under direct vision, positive ETCO2 and breath sounds checked- equal and bilateral Secured at: 22 cm Tube secured with: Tape Dental Injury: Teeth and Oropharynx as per pre-operative assessment

## 2022-07-05 NOTE — Progress Notes (Signed)
In withdrawal and confused this am. Physiologically calm and stable at this time.  I discussed with the patient's father the risks and benefits of surgery for left plateau, including the possibility of infection, nerve injury, vessel injury, wound breakdown, arthritis, symptomatic hardware, DVT/ PE, loss of motion, malunion, nonunion, and need for further surgery among others. He acknowledged these risks and wished to proceed.  Myrene Galas, MD [facility], [facility] [phone]

## 2022-07-05 NOTE — Assessment & Plan Note (Addendum)
S/p 3 units pRBC Fluctuating, downtrended today Iron def anemia

## 2022-07-05 NOTE — Progress Notes (Signed)
Pt had faulty MEWS firing based on vitals that incorrectly taken. Vitals were rechecked and back to pts baseline. Pt is a yellow/green for most of shift. Day shift providers are aware.

## 2022-07-05 NOTE — Assessment & Plan Note (Deleted)
Likely multifactorial in setting of surgery and narcotics. No abdominal pain or emesis. NG tube placed (patient removed and new one placed). -NPO; continue NG tube w/ intermittent suction -Abdominal X-ray in AM -Monitor for bowel function -IV fluids

## 2022-07-05 NOTE — Progress Notes (Signed)
OT Cancellation Note  Patient Details Name: Ryan Lynch MRN: [id number] DOB: [birth year]   Cancelled Treatment:    Reason Eval/Treat Not Completed: Patient at procedure or test/ unavailable. Pt currently in OR for OPEN REDUCTION INTERNAL FIXATION (ORIF) TIBIAL PLATEAU (Left: Leg Lower). Unable to complete OT treatment at this time. Will re-assess patient's functional performance post-op when able.   [name], OTR/L,CBIS  Supplemental OT - [facility]  07/05/2022, 10:13 AM

## 2022-07-05 NOTE — Progress Notes (Addendum)
Pt blood started w/o any issues or complaints.   Pt NG tube in waiting on conformation of placement.  Pt has an increase in agitation. Please see CIWA scale. Meds given.   Pt vitals changing. HR is increasing and BP is elevated.   Asked for rapid to come respond to pt.

## 2022-07-05 NOTE — Significant Event (Signed)
Rapid Response Event Note   Reason for Call :  2nd set of eyes-increased agitation  Initial Focused Assessment:  Pt lying in bed with eyes closed. He has the hiccups. He is alert and oriented. He is c/o being anxious. He is pulling at his restraints and trying to sit up. Lungs CTA. ABD distended but soft. Skin warm/diaphoretic.  T-98.6, HR-132, BP-154/90, RR-26, SpO2-95% on 2L [place].  CIWA-16 Pt has had 10mg  ativan since noon.   Interventions:  Ativan per CIWA protocol  Plan of Care:  Ativan ordered q1h prn per CIWA protocol. Continue this. Continue to monitor pt closely. Call RRT if further assistance needed.   Event Summary:   MD Notified:  Call Time:0027 Arrival Time:0040 End Time:0100  , RN

## 2022-07-05 NOTE — Progress Notes (Signed)
Pt is asleep at this time.

## 2022-07-05 NOTE — Anesthesia Preprocedure Evaluation (Signed)
Anesthesia Evaluation  Patient identified by MRN, date of birth, ID band Patient unresponsive    Reviewed: Allergy & Precautions, NPO status , Patient's Chart, lab work & pertinent test results  History of Anesthesia Complications Negative for: history of anesthetic complications  Airway Mallampati: Unable to assess       Dental   Pulmonary neg COPD,     + decreased breath sounds      Cardiovascular negative cardio ROS   Rhythm:Regular Rate:Tachycardia     Neuro/Psych PSYCHIATRIC DISORDERS Anxiety  Neuromuscular disease    GI/Hepatic (+)     substance abuse  alcohol use, Ileus with NG, large volume output in preop   Endo/Other  negative endocrine ROS  Renal/GU negative Renal ROSLab Results      Component                Value               Date                      CREATININE               0.83                07/04/2022                Musculoskeletal left tibial plateau fracture   Abdominal   Peds  Hematology  (+) Blood dyscrasia, anemia , Lab Results      Component                Value               Date                      WBC                      5.9                 07/04/2022                HGB                      7.1 (L)             07/04/2022                HCT                      21.9 (L)            07/04/2022                MCV                      91.3                07/04/2022                PLT                      82 (L)              07/04/2022              Anesthesia Other Findings CIWA protocol for presumed Alcohol withdrawal, Ativan and withdrawal contributing to current mental status.  Reproductive/Obstetrics  Anesthesia Physical Anesthesia Plan  ASA: 4  Anesthesia Plan: General   Post-op Pain Management:    Induction: Intravenous and Rapid sequence  PONV Risk Score and Plan: 2 and Ondansetron and Dexamethasone  Airway Management  Planned: Oral ETT  Additional Equipment:   Intra-op Plan:   Post-operative Plan: Possible Post-op intubation/ventilation  Informed Consent:     History available from chart only  Plan Discussed with: CRNA  Anesthesia Plan Comments:         Anesthesia Quick Evaluation

## 2022-07-05 NOTE — Transfer of Care (Signed)
Immediate Anesthesia Transfer of Care Note  Patient: Eligh Rybacki  Procedure(s) Performed: OPEN REDUCTION INTERNAL FIXATION (ORIF) TIBIAL PLATEAU (Left: Leg Lower) SECONDARY CLOSURE OF WOUND (Left: Leg Lower) APPLICATION OF WOUND VAC (Left: Leg Lower)  Patient Location: PACU  Anesthesia Type:General  Level of Consciousness: drowsy and patient cooperative  Airway & Oxygen Therapy: Patient Spontanous Breathing and Patient connected to face mask oxygen  Post-op Assessment: Report given to RN and Post -op Vital signs reviewed and stable  Post vital signs: Reviewed and stable  Last Vitals:  Vitals Value Taken Time  BP 142/88 07/05/22 1252  Temp    Pulse 104 07/05/22 1258  Resp 12 07/05/22 1258  SpO2 96 % 07/05/22 1258  Vitals shown include unvalidated device data.  Last Pain:  Vitals:   07/05/22 0500  TempSrc: Axillary  PainSc:       Patients Stated Pain Goal: 0 (07/04/22 0112)  Complications: No notable events documented.

## 2022-07-05 NOTE — Progress Notes (Addendum)
OR ready for pt. Update given to person calling that consent not signed. Also made them aware of pts events throughout shift.   Transport at bedside. Made them aware that this RN can't accompany pt at this time. In process of cleaning pt and transport stated not to worry about it, they will take care of it in the OR.   Transported pt to OR. Report given to [facility].

## 2022-07-05 NOTE — Progress Notes (Signed)
Pt in new restraints and mittens. Pt is confused and is pulling at ALL lines. Pt needs blood, NG tube, tele wires and cont IV fluids. Pt states he understands and apologizes for his behaviors. He stated that he knows that he is withdrawing from alcohol.

## 2022-07-05 NOTE — Progress Notes (Addendum)
Pt pulled out NG tube. Pt had increased confusion. Another 2mg  of ativan given. Still waiting on orders for placement.  Will reach out to attending service for further orders.   1st unit of blood is done w/o any issues/complaints.

## 2022-07-06 ENCOUNTER — Inpatient Hospital Stay (HOSPITAL_COMMUNITY): Payer: BC Managed Care – PPO

## 2022-07-06 DIAGNOSIS — Z4889 Encounter for other specified surgical aftercare: Secondary | ICD-10-CM | POA: Diagnosis not present

## 2022-07-06 LAB — URINALYSIS, ROUTINE W REFLEX MICROSCOPIC
Bilirubin Urine: NEGATIVE
Glucose, UA: NEGATIVE mg/dL
Ketones, ur: 20 mg/dL — AB
Nitrite: NEGATIVE
Protein, ur: 30 mg/dL — AB
Specific Gravity, Urine: 1.023 (ref 1.005–1.030)
pH: 6 (ref 5.0–8.0)

## 2022-07-06 LAB — COMPREHENSIVE METABOLIC PANEL
ALT: 49 U/L — ABNORMAL HIGH (ref 0–44)
AST: 93 U/L — ABNORMAL HIGH (ref 15–41)
Albumin: 2.6 g/dL — ABNORMAL LOW (ref 3.5–5.0)
Alkaline Phosphatase: 58 U/L (ref 38–126)
Anion gap: 7 (ref 5–15)
BUN: 9 mg/dL (ref 6–20)
CO2: 24 mmol/L (ref 22–32)
Calcium: 7.8 mg/dL — ABNORMAL LOW (ref 8.9–10.3)
Chloride: 107 mmol/L (ref 98–111)
Creatinine, Ser: 0.75 mg/dL (ref 0.61–1.24)
GFR, Estimated: >60 mL/min (ref >60)
Glucose, Bld: 116 mg/dL — ABNORMAL HIGH (ref 70–99)
Potassium: 3.2 mmol/L — ABNORMAL LOW (ref 3.5–5.1)
Sodium: 138 mmol/L (ref 135–145)
Total Bilirubin: 1.7 mg/dL — ABNORMAL HIGH (ref 0.3–1.2)
Total Protein: 4.9 g/dL — ABNORMAL LOW (ref 6.5–8.1)

## 2022-07-06 LAB — AMMONIA: Ammonia: 46 umol/L — ABNORMAL HIGH (ref 9–35)

## 2022-07-06 LAB — CBC
HCT: 25.2 % — ABNORMAL LOW (ref 39.0–52.0)
Hemoglobin: 8.5 g/dL — ABNORMAL LOW (ref 13.0–17.0)
MCH: 29.3 pg (ref 26.0–34.0)
MCHC: 33.7 g/dL (ref 30.0–36.0)
MCV: 86.9 fL (ref 80.0–100.0)
Platelets: 101 10*3/uL — ABNORMAL LOW (ref 150–400)
RBC: 2.9 MIL/uL — ABNORMAL LOW (ref 4.22–5.81)
RDW: 17.3 % — ABNORMAL HIGH (ref 11.5–15.5)
WBC: 5.1 10*3/uL (ref 4.0–10.5)
nRBC: 0.6 % — ABNORMAL HIGH (ref 0.0–0.2)

## 2022-07-06 LAB — RAPID URINE DRUG SCREEN, HOSP PERFORMED
Amphetamines: NOT DETECTED
Barbiturates: NOT DETECTED
Benzodiazepines: POSITIVE — AB
Cocaine: NOT DETECTED
Opiates: POSITIVE — AB
Tetrahydrocannabinol: NOT DETECTED

## 2022-07-06 LAB — CK: Total CK: 405 U/L — ABNORMAL HIGH (ref 49–397)

## 2022-07-06 MED ORDER — CEFAZOLIN SODIUM-DEXTROSE 2-4 GM/100ML-% IV SOLN
2.0000 g | INTRAVENOUS | Status: AC
  Administered 2022-07-07: 2 g via INTRAVENOUS
  Filled 2022-07-06: qty 100

## 2022-07-06 MED ORDER — POTASSIUM CHLORIDE CRYS ER 20 MEQ PO TBCR: 40.0000 meq | EXTENDED_RELEASE_TABLET | ORAL | Status: DC

## 2022-07-06 MED ORDER — POTASSIUM CHLORIDE 10 MEQ/100ML IV SOLN
10.0000 meq | INTRAVENOUS | Status: AC
  Administered 2022-07-06 (×4): 10 meq via INTRAVENOUS
  Filled 2022-07-06 (×4): qty 100

## 2022-07-06 MED ORDER — LORAZEPAM 2 MG/ML IJ SOLN
2.0000 mg | Freq: Once | INTRAMUSCULAR | Status: AC
  Administered 2022-07-06: 2 mg via INTRAVENOUS
  Filled 2022-07-06: qty 1

## 2022-07-06 MED ORDER — THIAMINE HCL 100 MG/ML IJ SOLN
500.0000 mg | Freq: Three times a day (TID) | INTRAVENOUS | Status: AC
  Administered 2022-07-06 – 2022-07-08 (×7): 500 mg via INTRAVENOUS
  Filled 2022-07-06 (×10): qty 5

## 2022-07-06 MED ORDER — THIAMINE MONONITRATE 100 MG PO TABS: 100.0000 mg | ORAL_TABLET | Freq: Every day | ORAL | Status: DC

## 2022-07-06 MED ORDER — THIAMINE HCL 100 MG/ML IJ SOLN
250.0000 mg | Freq: Every day | INTRAVENOUS | Status: DC
  Administered 2022-07-10 – 2022-07-11 (×2): 250 mg via INTRAVENOUS
  Filled 2022-07-06 (×3): qty 2.5

## 2022-07-06 NOTE — Progress Notes (Signed)
PROGRESS NOTE    Ryan Lynch  ZOX:[id number] DOB: 1981-09-22 DOA: 07/02/2022 PCP: Patient, No Pcp Per  Chief Complaint  Patient presents with   Fall    Brief Narrative:  Ryan Lynch is Ryan Lynch 41 y.o. male with Ryan Lynch history of alcohol use and anxiety. Patient presented secondary to Ryan Lynch fall down his stairs and subsequent worsening pain. Upon arrival to the ED, he was found to have evidence of Ryan Lynch left tibial fracture and evidence of compartment syndrome, requiring emergent fasciotomy. Patient has developed moderate alcohol withdrawal symptoms.    Assessment & Plan:   Principal Problem:   Observation after surgery Active Problems:   Tibial plateau fracture, left   Alcohol withdrawal ([name])   Alcohol use   Compartment syndrome of left lower extremity, initial encounter ([name])   Ileus ([name])   Transaminitis   Traumatic rhabdomyolysis ([name])   Acute hepatic encephalopathy ([name])   Acute anemia   Adrenal nodule ([name])   Thrombocytopenia ([name])   Compression fracture of T7 vertebra ([name])   Anxiety   Psoriasis   Assessment and Plan: Tibial plateau fracture, left Patient admitted to trauma service. S/p emergent fasciotomy on 8/19 for compartment syndrome. Management per primary  surgery planned for 8/24  Alcohol withdrawal ([name]) Librium discontinued  Currently on ativan per ciwa  High dose thiamine  multivitamin and folic acid  Alcohol use Treating withdrawal as above  Daily consumption. Alcohol level undetectable on admission. Patient hoping to detox. Started on CIWA on admission. Social work consulted.  Ileus ([name]) Likely multifactorial in setting of surgery and narcotics. No abdominal pain or emesis.  CT abd/pelvis with evidence of ileus NG tube placed (patient removed and new one placed) -> pulled today, will replace  Continue NPO, NGT to LIS  KUB 8/23 with slight progression of small bowel distension, suggestive of ileus Repeat KUB 8/24  Compartment syndrome of left lower  extremity, initial encounter ([name]) See problem, Tibial plateau fracture, left.  Acute anemia S/p 2 units pRBC trend  Acute hepatic encephalopathy ([name]) Patient with an ammonia of 96 on admission but without significant confusion at that time. He is oriented, but is starting to seem somewhat confused/slow to respond. Likely multifactorial with alcohol withdrawal Ammonia still elevated today, though overall downtrended without addition of lactulose Will monitor for now, if continued encephalopathy, will need to consider rectal lactulose enemas  Traumatic rhabdomyolysis ([name]) Secondary to fall down the stairs. Resultant compartment syndrome. CK of 1,513, down to 975 but has increased.  CK improved  Continue IVF   Transaminitis AST/ALT elevated to 188/59 on admission respectively Secondary to rhabdomyolysis from compartment syndrome and possibly alcohol use.  improving  Thrombocytopenia ([name]) Likely related to trauma and blood loss. Patient with evidence of hepatic steatosis and mild splenomegaly on imaging. No prior labs available. Platelets trended down but appear to have stabilized. -Trend CBC -Watch for bleeding  Adrenal nodule ([name]) Bilateral with Ryan Lynch left 16 mm indeterminate left adrenal hypodense mass and Ryan Lynch right 10 mm indeterminate right adrenal gland mass. Recommendations for outpatient non-emergent CT or MRI of the adrenal glands.  Compression fracture of T7 vertebra ([name]) -Pain management per primary  Anxiety -Continue Zoloft -Hold Klonopin while on CIWA/Librium taper  Psoriasis Noted on exam and now notice in history. Not on medication therapy per medication history.      DVT prophylaxis: SCD, per primary Code Status: full Family Communication: none Disposition:   Status is: Inpatient Remains inpatient appropriate because: additional treatment for encephalopathy, ortho  Consultants:  orthopedics  Procedures:  8/19 1. EMERGENT FOUR COMPARTMENT  FASCIOTOMIES LEFT LEG  Antimicrobials:  Anti-infectives (From admission, onward)    Start     Dose/Rate Route Frequency Ordered Stop   07/07/22 1000  ceFAZolin (ANCEF) IVPB 2g/100 mL premix        2 g 200 mL/hr over 30 Minutes Intravenous On call to O.R. 07/06/22 1001 07/08/22 0559   07/05/22 1800  ceFAZolin (ANCEF) IVPB 2g/100 mL premix        2 g 200 mL/hr over 30 Minutes Intravenous Every 8 hours 07/05/22 1540 07/06/22 1445   07/05/22 0600  ceFAZolin (ANCEF) IVPB 2g/100 mL premix        2 g 200 mL/hr over 30 Minutes Intravenous On call to O.R. 07/04/22 1225 07/05/22 0920   07/02/22 1830  ceFAZolin (ANCEF) IVPB 1 g/50 mL premix        1 g 100 mL/hr over 30 Minutes Intravenous Every 6 hours 07/02/22 1731 07/03/22 2006   07/02/22 1430  ceFAZolin (ANCEF) IVPB 1 g/50 mL premix        1 g 100 mL/hr over 30 Minutes Intravenous  Once 07/02/22 1419 07/03/22 2006       Subjective: No new complaints Asking for mittens off  Objective: Vitals:   07/06/22 0003 07/06/22 0343 07/06/22 0720 07/06/22 1138  BP: 129/75  125/80 122/80  Pulse: (!) 119  (!) 114 (!) 109  Resp:   16 (!) 24  Temp:  99.4 F (37.4 C) 99.1 F (37.3 C) 99 F (37.2 C)  TempSrc:  Oral Oral Oral  SpO2:   96% 95%  Weight:      Height:        Intake/Output Summary (Last 24 hours) at 07/06/2022 1728 Last data filed at 07/06/2022 1700 Gross per 24 hour  Intake 2019.49 ml  Output 810 ml  Net 1209.49 ml   Filed Weights   07/02/22 1300 07/03/22 2222  Weight: 86.2 kg 91.6 kg    Examination:  General exam: Appears confused, agitated Respiratory system: unlabored Cardiovascular system: RRR Gastrointestinal system: Abdomen is nondistended, soft and nontender. Central nervous system: Alert, but disoriented Extremities: no LEE    Data Reviewed: I have personally reviewed following labs and imaging studies  CBC: Recent Labs  Lab 07/02/22 0933 07/02/22 0942 07/03/22 0251 07/04/22 0231 07/05/22 0907  07/05/22 1133 07/05/22 1558 07/06/22 0248  WBC 7.4  --  5.1 5.9  --   --  6.4 5.1  NEUTROABS  --   --   --  4.8  --   --   --   --   HGB 12.0*   < > 8.0* 7.1* 9.9* 7.8* 9.2* 8.5*  HCT 36.3*   < > 24.0* 21.9* 29.0* 23.0* 27.0* 25.2*  MCV 90.1  --  90.6 91.3  --   --  85.4 86.9  PLT 103*  --  72* 82*  --   --  113* 101*   < > = values in this interval not displayed.    Basic Metabolic Panel: Recent Labs  Lab 07/02/22 0933 07/02/22 0942 07/03/22 0251 07/04/22 0231 07/05/22 0907 07/05/22 1133 07/05/22 1558 07/06/22 0248  NA 135   < > 135 134* 136 135 137 138  K 4.3   < > 4.3 3.9 3.6 3.6 3.4* 3.2*  CL 101   < > 106 105 100  --  105 107  CO2 19*  --  21* 22  --   --  23 24  GLUCOSE 135*   < > 125* 129* 127*  --  122* 116*  BUN 8   < > 9 12 12   --  11 9  CREATININE 0.82   < > 0.74 0.83 0.50*  --  0.75 0.75  CALCIUM 8.6*  --  7.7* 8.1*  --   --  7.8* 7.8*  PHOS  --   --   --   --   --   --  2.6  --    < > = values in this interval not displayed.    GFR: Estimated Creatinine Clearance: 139.2 mL/min (by C-G formula based on SCr of 0.75 mg/dL).  Liver Function Tests: Recent Labs  Lab 07/02/22 0933 07/03/22 0251 07/04/22 0231 07/05/22 1558 07/06/22 0248  AST 188* 133* 171* 113* 93*  ALT 59* 41 47* 53* 49*  ALKPHOS 75 57 66 56 58  BILITOT 2.0* 2.1* 2.0* 1.6* 1.7*  PROT 7.3 5.2* 5.3* 5.1* 4.9*  ALBUMIN 3.9 2.7* 2.8* 2.7*  2.7* 2.6*    CBG: Recent Labs  Lab 07/04/22 2059  GLUCAP 128*     Recent Results (from the past 240 hour(s))  Resp Panel by RT-PCR (Flu Aolani Piggott&B, Covid) Anterior Nasal Swab     Status: None   Collection Time: 07/02/22 10:44 AM   Specimen: Anterior Nasal Swab  Result Value Ref Range Status   SARS Coronavirus 2 by RT PCR NEGATIVE NEGATIVE Final    Comment: (NOTE) SARS-CoV-2 target nucleic acids are NOT DETECTED.  The SARS-CoV-2 RNA is generally detectable in upper respiratory specimens during the acute phase of infection. The  lowest concentration of SARS-CoV-2 viral copies this assay can detect is 138 copies/mL. Patric Buckhalter negative result does not preclude SARS-Cov-2 infection and should not be used as the sole basis for treatment or other patient management decisions. Tyreke Kaeser negative result may occur with  improper specimen collection/handling, submission of specimen other than nasopharyngeal swab, presence of viral mutation(s) within the areas targeted by this assay, and inadequate number of viral copies(<138 copies/mL). Kerry Odonohue negative result must be combined with clinical observations, patient history, and epidemiological information. The expected result is Negative.  Fact Sheet for Patients:  BloggerCourse.com  Fact Sheet for Healthcare Providers:  SeriousBroker.it  This test is no t yet approved or cleared by the [place] FDA and  has been authorized for detection and/or diagnosis of SARS-CoV-2 by FDA under an Emergency Use Authorization (EUA). This EUA will remain  in effect (meaning this test can be used) for the duration of the COVID-19 declaration under Section 564(b)(1) of the Act, 21 U.S.C.section 360bbb-3(b)(1), unless the authorization is terminated  or revoked sooner.       Influenza Marche Hottenstein by PCR NEGATIVE NEGATIVE Final   Influenza B by PCR NEGATIVE NEGATIVE Final    Comment: (NOTE) The Xpert Xpress SARS-CoV-2/FLU/RSV plus assay is intended as an aid in the diagnosis of influenza from Nasopharyngeal swab specimens and should not be used as Sydny Schnitzler sole basis for treatment. Nasal washings and aspirates are unacceptable for Xpert Xpress SARS-CoV-2/FLU/RSV testing.  Fact Sheet for Patients: BloggerCourse.com  Fact Sheet for Healthcare Providers: SeriousBroker.it  This test is not yet approved or cleared by the [place] FDA and has been authorized for detection and/or diagnosis of SARS-CoV-2 by FDA under  an Emergency Use Authorization (EUA). This EUA will remain in effect (meaning this test can be used) for the duration of the COVID-19 declaration under Section 564(b)(1) of the Act, 21 U.S.C.  section 360bbb-3(b)(1), unless the authorization is terminated or revoked.  Performed at [facility], [address], [place], [place] [address]          Radiology Studies: DG Abd Portable 1V  Result Date: 07/06/2022 CLINICAL DATA:  NG tube EXAM: PORTABLE ABDOMEN - 1 VIEW COMPARISON:  07/05/2022 FINDINGS: NG tube remains position within the stomach. Numerous dilated loops of small bowel persist measuring up to 4.7 cm in diameter, previously 4.5 cm. Gaseous distension of the colon is also seen. Pattern remains more suggestive of ileus. IMPRESSION: NG tube remains within the stomach. Slight progression of small bowel distension. Overall pattern remains suggestive of ileus although partial or intermittent obstruction not excluded. Continued follow-up recommended. Electronically Signed   By: Duanne Guess D.O.   On: 07/06/2022 13:50   DG Knee Complete 4 Views Left  Result Date: 07/05/2022 CLINICAL DATA:  [id number]; ORIF of left knee fracture EXAM: Images performed intraoperatively without presence of radiologist. Five intraoperative images of the left knee were obtained. Fluoro time: 44 seconds. Fluoro dose: 3.08 mGy COMPARISON:  July 02, 2022 FINDINGS: There are postsurgical changes seen with Juaquina Machnik plate and screw devices transfixing the comminuted approximately 2 cm wedge-shaped depressed fracture of the lateral tibial plateau. The alignment is near anatomical. Soft tissue swelling. IMPRESSION: ORIF changes with plate and screw devices transfix the comminuted fracture of the lateral tibial plateau. The alignment is near anatomical. Electronically Signed   By: Marjo Bicker M.D.   On: 07/05/2022 12:03   DG C-Arm 1-60 Min-No Report  Result Date: 07/05/2022 Fluoroscopy was utilized by the  requesting physician.  No radiographic interpretation.   DG C-Arm 1-60 Min-No Report  Result Date: 07/05/2022 Fluoroscopy was utilized by the requesting physician.  No radiographic interpretation.   DG C-Arm 1-60 Min-No Report  Result Date: 07/05/2022 Fluoroscopy was utilized by the requesting physician.  No radiographic interpretation.   DG Abd Portable 1V  Result Date: 07/05/2022 CLINICAL DATA:  41 year old male status post advancement of nasogastric tube. EXAM: PORTABLE ABDOMEN - 1 VIEW COMPARISON:  Abdominal radiographs 07/05/2022. FINDINGS: Nasogastric tube has been advanced slightly, now with side port approximately 8 cm distal to the gastroesophageal junction. Mild gaseous distention of the stomach. Persistent gaseous distension throughout the small bowel, with small bowel loops measuring up to 4.5 cm in the central abdomen. Lilyanne Mcquown small amount of gas is also noted in the colon. IMPRESSION: 1. Support apparatus, as above. 2. Abnormal bowel-gas pattern, most suggestive of ileus based on comparison with prior CT 07/04/2022. Electronically Signed   By: Trudie Reed M.D.   On: 07/05/2022 07:01   DG Abd Portable 1V  Result Date: 07/05/2022 CLINICAL DATA:  Nasogastric tube placement EXAM: PORTABLE ABDOMEN - 1 VIEW COMPARISON:  Abdominal CT from yesterday FINDINGS: Enteric tube with tip at the upper stomach and side-port over the lower esophagus. 7 cm advancement needed to place the side port at the stomach. Diffuse gaseous distension of bowel as seen on prior CT. No concerning mass effect or gas collection. Right upper quadrant calcification. IMPRESSION: 1. Enteric tube with tip at the stomach and side-port over the lower esophagus. Need 7 cm of advancement to place the side port at the upper stomach. 2. Ileus pattern. Electronically Signed   By: Tiburcio Pea M.D.   On: 07/05/2022 06:00   DG CHEST PORT 1 VIEW  Result Date: 07/05/2022 CLINICAL DATA:  [id number].  OG tube placement. EXAM: PORTABLE  CHEST 1 VIEW COMPARISON:  Chest CT  07/02/2022 FINDINGS: 11:54 p.m. Interval enteric tube insertion. The tip is not included in the field but probably abuts the far wall of the body of the stomach based on the positioning of the proximal side hole. The lungs are expiratory. There is increased bilateral streaky atelectasis or infiltrate in the infrahilar areas. The remaining hypoexpanded lungs are generally clear. No pleural effusion is seen. The cardiomediastinal silhouette and central vasculature are stable. IMPRESSION: 1. Enteric tube is adequately intragastric, tip abutting the far wall of the body of stomach. 2. Expiratory exam with increased bilateral infrahilar opacities consistent with atelectasis or pneumonia. Electronically Signed   By: Almira Bar M.D.   On: 07/05/2022 00:09   CT ABDOMEN PELVIS WO CONTRAST  Result Date: 07/04/2022 CLINICAL DATA:  Bowel obstruction suspected. EXAM: CT ABDOMEN AND PELVIS WITHOUT CONTRAST TECHNIQUE: Multidetector CT imaging of the abdomen and pelvis was performed following the standard protocol without IV contrast. RADIATION DOSE REDUCTION: This exam was performed according to the departmental dose-optimization program which includes automated exposure control, adjustment of the mA and/or kV according to patient size and/or use of iterative reconstruction technique. COMPARISON:  Radiographs same date.  Abdominopelvic CT 07/02/2022 FINDINGS: Lower chest: Interval increased dependent atelectasis at both lung bases. No significant pleural or pericardial effusion. No basilar pneumothorax. Hepatobiliary: Hepatic steatosis without evidence of focal abnormality or acute injury on noncontrast imaging. Unchanged atypical appearance of the gallbladder which appears decompressed with Rhodie Cienfuegos possible stone in the fundal region. There are small calcifications more proximally within the gallbladder wall. No surrounding inflammatory changes or biliary dilatation. Pancreas: Unremarkable. No  pancreatic ductal dilatation or surrounding inflammatory changes. Spleen: Measures 13.7 x 7.6 x 13.5 cm (volume = 740 cm^3), consistent with mild splenomegaly, unchanged. No focal abnormality or surrounding fluid collection on noncontrast imaging. Adrenals/Urinary Tract: Small low-density adrenal nodules bilaterally, measuring 1.3 cm and 9 HU on the right (image 27/3) and 1.5 cm and -2 HU on the left (image 31/3). These are consistent with benign adenomas, likely incidental. Tiny nonobstructing calculus in the lower pole of the right kidney. No evidence of ureteral calculus or hydronephrosis. The bladder appears unremarkable for its degree of distention. Stomach/Bowel: Enteric contrast was administered and has passed into the proximal small bowel. The stomach is not significantly distended. There is moderate diffuse small bowel and colonic dilatation without focal transition point, wall thickening or surrounding inflammation. The appendix appears normal. Vascular/Lymphatic: There are no enlarged abdominal or pelvic lymph nodes. No significant vascular findings on noncontrast imaging. No evidence of retroperitoneal hematoma. Reproductive: The prostate gland and seminal vesicles appear unremarkable. Other: Trace ascites in the right lower quadrant and left pelvis. No focal extraluminal fluid collection. No pneumoperitoneum. Musculoskeletal: No acute or significant osseous findings in the abdomen or pelvis. Mild T7 superior endplate compression deformity again noted. IMPRESSION: 1. Interval development of diffuse small and large bowel distension in Kimball Manske pattern most consistent with an ileus. Recommend radiographic follow-up. Patient may benefit from nasogastric tube decompression of the stomach. 2. Small amount of ascites in the right lower quadrant and left pelvis of undetermined etiology. No evidence of contrast leak, free air or drainable fluid collection. 3. Stable atypical appearance of the gallbladder which may be  secondary to gallstones and/or gallbladder wall calcification. 4. Previously demonstrated small adrenal nodules measure near water density, consistent with adenomas, likely incidental. Electronically Signed   By: Carey Bullocks M.D.   On: 07/04/2022 19:34        Scheduled Meds:  sodium chloride  Intravenous Once   sodium chloride   Intravenous Once   acetaminophen  650 mg Oral Once   vitamin C  1,000 mg Oral Daily   cholecalciferol  2,000 Units Oral BID   docusate sodium  100 mg Oral BID   folic acid  1 mg Oral Daily   furosemide  20 mg Intravenous Once   furosemide  20 mg Intravenous Once   LORazepam  0-4 mg Intravenous Q4H   Followed by   Melene Muller ON 07/07/2022] LORazepam  0-4 mg Intravenous Q8H   methocarbamol  1,000 mg Oral TID   multivitamin with minerals  1 tablet Oral Daily   polyethylene glycol  17 g Oral Daily   pregabalin  75 mg Oral TID   sertraline  200 mg Oral Daily   [START ON 07/14/2022] thiamine  100 mg Oral Daily   zinc sulfate  220 mg Oral Daily   Continuous Infusions:  [START ON 07/07/2022]  ceFAZolin (ANCEF) IV     lactated ringers 150 mL/hr at 07/06/22 1326   thiamine (VITAMIN B1) injection 500 mg (07/06/22 1329)   Followed by   Melene Muller ON 07/09/2022] thiamine (VITAMIN B1) injection       LOS: 4 days    Time spent: over 30 min    Lacretia Nicks, MD [facility]   To contact the attending provider between 7A-7P or the covering provider during after hours 7P-7A, please log into the web site [email or web address] and access using universal [place] password for that web site. If you do not have the password, please call the hospital operator.  07/06/2022, 5:28 PM

## 2022-07-06 NOTE — Progress Notes (Signed)
Upon entrance into room, pt had mittens off and had pulled out NG tube. NG tube replaced per MD and KUB ordered.

## 2022-07-06 NOTE — Progress Notes (Signed)
2030: this RN went into pt room due to the monitor alarming d/t leads being off, pt has pulled leads as well as IV out   2235: This RN walked into pt room due to bed alarm going off, pt had pulled out his NG tube as well as had his hand halfway out of his mitten. Pt stated he was trying to "clean himself" this RN and NT gave pt a bath and repositioned him in the bed, informed MD on call, MD recommended keeping NG tube out to lessen agitation since he is not vomiting  & given orders for 2mg  Ativan x1  0010: pt pulled out IV again, pulled mittens off, this RN changed pt gown and made MD aware, given orders for bil. Wrist restraints

## 2022-07-06 NOTE — Progress Notes (Signed)
Nutrition Follow-up  DOCUMENTATION CODES:   Not applicable  INTERVENTION:  Spoke with MD regarding recommendation for TPN initiation given >7day inadequate PO intake and ongoing NPO status d/t ileus/possible SBO  NUTRITION DIAGNOSIS:   Increased nutrient needs related to acute illness, post-op healing as evidenced by estimated needs.  Ongoing  GOAL:   Patient will meet greater than or equal to 90% of their needs  Goal not met-pt now NPO  MONITOR:   PO intake, Supplement acceptance, Labs, Weight trends, I & O's, Skin  REASON FOR ASSESSMENT:   Consult Assessment of nutrition requirement/status  ASSESSMENT:   Pt admitted after a fall leading to L bicondylar tibial plateau fracture s/p external fixation and acute compartment syndrome s/p 4 compartment fasciotomies. PMH significant for alcohol use  8/22: s/p ORIF L tibial plateau  Per Ortho, noted plans to return to OR tomorrow for attempted closure of remaining open fasciotomy, may need STSG. Also of note, pt in active alcohol withdrawal. A/o x3, disoriented to time.   Now with NG tube in place to LIS d/t ileus. Unable to r/o SBO.   Spoke with patient at bedside. He continued to hiccup/burp and c/o dry mouth during assessment. He states that he is hungry and wishing for food. He recalls not having eaten at least 7 days PTA as he was feeling nauseous. Prior to that he reports having eaten well although unable to provide much detailed history as he was uncomfortable and continuing to hiccup/burp.   He states that his weight PTA was ~186 lbs and suspects he has had additional weight loss given NPO status. Unfortunately, there is limited documentation of weight history on file to review. Current weight noted to be 91.6 kg. Will continue to monitor throughout admission.   Medications reviewed and include: Vitamin C, Vitamin D3, colace, folvite, lasix, MVI, miralax, thiamine, zinc sulfate (no PO meds being given) IV drips: abx, LR  @ 129m/hr, thiamine  Labs: potassium 3.2 (L-repleted), ionized Ca 1.09, AST 93, ALT 49, ammonia 47, total bili 1.7, Vitamin D 12.19 (L)  UOP: [id number] hours NG to LIS: 55027m12 hours Negative pressure wound therapy: 56m67m2 hours I/O's: +[id number] admission  NUTRITION - FOCUSED PHYSICAL EXAM:  Flowsheet Row Most Recent Value  Orbital Region No depletion  Upper Arm Region Mild depletion  Thoracic and Lumbar Region No depletion  Buccal Region No depletion  Temple Region No depletion  Clavicle Bone Region No depletion  Clavicle and Acromion Bone Region No depletion  Scapular Bone Region No depletion  Dorsal Hand No depletion  Patellar Region Moderate depletion  [unable to assess L leg d/t stabilizer]  Anterior Thigh Region Moderate depletion  Posterior Calf Region No depletion  Edema (RD Assessment) None  Hair Reviewed  Eyes Reviewed  Mouth Reviewed  Skin Other (Comment)  [R leg with round dry patches]  Nails Unable to assess  [handmits]      Diet Order:   Diet Order             Diet NPO time specified  Diet effective midnight           Diet NPO time specified  Diet effective now                   EDUCATION NEEDS:   No education needs have been identified at this time  Skin:  Skin Assessment: Skin Integrity Issues: Skin Integrity Issues:: Incisions Incisions: L leg (closed)  Last BM:  8/21  Height:  Ht Readings from Last 1 Encounters:  07/03/22 5' 11"  (1.803 m)    Weight:   Wt Readings from Last 1 Encounters:  07/03/22 91.6 kg   BMI:  Body mass index is 28.17 kg/m.  Estimated Nutritional Needs:   Kcal:  2400-2600  Protein:  120-135g  Fluid:  >/=2L  Ryan Lynch, RDN, LDN Clinical Nutrition

## 2022-07-06 NOTE — Progress Notes (Signed)
Speech Language Pathology Discharge Patient Details Name: Ryan Lynch MRN: [id number] DOB: April 30, 1981 Today's Date: 07/06/2022 Time:  -     Patient discharged from SLP services secondary to medical decline - will need to re-order SLP to resume therapy services. Pt has SBO. Please reorder if needed for swallow assessment if appropriate.   Please see latest therapy progress note for current level of functioning and progress toward goals.    Progress and discharge plan discussed with patient and/or caregiver: Not discussed with pt    Royce Macadamia 07/06/2022, 9:44 AM

## 2022-07-06 NOTE — TOC Progression Note (Addendum)
Transition of Care [facility]) - Progression Note    Patient Details  Name: Aviyon Hocevar MRN: [id number] Date of Birth: Dec 24, 1980  Transition of Care [facility]) CM/SW Contact  Beckie Busing, RN Phone [phone]  07/06/2022, 10:18 AM  Clinical Narrative:    TOC following patient with consult for substance abuse screening. Patient is confused and unable to participate in screening. CM will attempt screening at another time.  1158 Patient has recommendations for 3in1 BSC and wheelchair. MD will need to enter DME orders.        Expected Discharge Plan and Services                                                 Social Determinants of Health (SDOH) Interventions    Readmission Risk Interventions     No data to display

## 2022-07-06 NOTE — Progress Notes (Signed)
Orthopaedic Trauma Service Progress Note  Patient ID: Ryan Lynch MRN: [id number] DOB/AGE: 07-25-1981 41 y.o.  Subjective:  Doing ok  Still confused   Knows he is Ryan Lynch  Knows he is at [place]  Thinks its October 2019 Pain seems controlled  Moderate NG tube output   U/O for afternoon yesterday looked appropriate. Approximately 0.8 cc/kg/hr. Overnight output not charted yet   IV fluids are running at 150 cc/hr (LR)  ROS As above  Objective:   VITALS:   Vitals:   07/05/22 2300 07/06/22 0003 07/06/22 0343 07/06/22 0720  BP: 120/77 129/75  125/80  Pulse: (!) 110 (!) 119  (!) 114  Resp: 16   16  Temp: 99.9 F (37.7 C)  99.4 F (37.4 C) 99.1 F (37.3 C)  TempSrc: Axillary  Oral Oral  SpO2: 94%   96%  Weight:      Height:        Estimated body mass index is 28.17 kg/m as calculated from the following:   Height as of this encounter: 5\' 11"  (1.803 m).   Weight as of this encounter: 91.6 kg.   Intake/Output      08/22 0701 08/23 0700 08/23 0701 08/24 0700   P.O.     I.V. (mL/kg) 3110 (34) 259.5 (2.8)   Blood 317    IV Piggyback 450    Total Intake(mL/kg) 3877 (42.3) 259.5 (2.8)   Urine (mL/kg/hr) 1050 (0.5)    Emesis/NG output 550    Drains 10    Other 100    Blood 500    Total Output 2210    Net +1667 +259.5          LABS  Results for orders placed or performed during the hospital encounter of 07/02/22 (from the past 24 hour(s))  I-STAT 7, (LYTES, BLD GAS, ICA, H+H)     Status: Abnormal   Collection Time: 07/05/22 11:33 AM  Result Value Ref Range   pH, Arterial 7.354 7.35 - 7.45   pCO2 arterial 43.6 32 - 48 mmHg   pO2, Arterial 152 (H) 83 - 108 mmHg   Bicarbonate 24.3 20.0 - 28.0 mmol/L   TCO2 26 22 - 32 mmol/L   O2 Saturation 99 %   Acid-base deficit 1.0 0.0 - 2.0 mmol/L   Sodium 135 135 - 145 mmol/L   Potassium 3.6 3.5 - 5.1 mmol/L   Calcium, Ion 1.09 (L) 1.15 -  1.40 mmol/L   HCT 23.0 (L) 39.0 - 52.0 %   Hemoglobin 7.8 (L) 13.0 - 17.0 g/dL   Sample type ARTERIAL   Prepare RBC (crossmatch)     Status: None   Collection Time: 07/05/22 11:36 AM  Result Value Ref Range   Order Confirmation      ORDER PROCESSED BY BLOOD BANK Performed at [facility], [address] [place], [place] [address]   CBC     Status: Abnormal   Collection Time: 07/05/22  3:58 PM  Result Value Ref Range   WBC 6.4 4.0 - 10.5 K/uL   RBC 3.16 (L) 4.22 - 5.81 MIL/uL   Hemoglobin 9.2 (L) 13.0 - 17.0 g/dL   HCT 96.227.0 (L) 95.239.0 - 84.152.0 %   MCV 85.4 80.0 - 100.0 fL   MCH 29.1 26.0 - 34.0 pg   MCHC 34.1  30.0 - 36.0 g/dL   RDW 86.7 (H) 61.9 - 50.9 %   Platelets 113 (L) 150 - 400 K/uL   nRBC 0.5 (H) 0.0 - 0.2 %  CK     Status: None   Collection Time: 07/05/22  3:58 PM  Result Value Ref Range   Total CK 362 49 - 397 U/L  Hepatic function panel     Status: Abnormal   Collection Time: 07/05/22  3:58 PM  Result Value Ref Range   Total Protein 5.1 (L) 6.5 - 8.1 g/dL   Albumin 2.7 (L) 3.5 - 5.0 g/dL   AST 326 (H) 15 - 41 U/L   ALT 53 (H) 0 - 44 U/L   Alkaline Phosphatase 56 38 - 126 U/L   Total Bilirubin 1.6 (H) 0.3 - 1.2 mg/dL   Bilirubin, Direct 0.8 (H) 0.0 - 0.2 mg/dL   Indirect Bilirubin 0.8 0.3 - 0.9 mg/dL  Renal function panel     Status: Abnormal   Collection Time: 07/05/22  3:58 PM  Result Value Ref Range   Sodium 137 135 - 145 mmol/L   Potassium 3.4 (L) 3.5 - 5.1 mmol/L   Chloride 105 98 - 111 mmol/L   CO2 23 22 - 32 mmol/L   Glucose, Bld 122 (H) 70 - 99 mg/dL   BUN 11 6 - 20 mg/dL   Creatinine, Ser 7.12 0.61 - 1.24 mg/dL   Calcium 7.8 (L) 8.9 - 10.3 mg/dL   Phosphorus 2.6 2.5 - 4.6 mg/dL   Albumin 2.7 (L) 3.5 - 5.0 g/dL   GFR, Estimated >45 >80 mL/min   Anion gap 9 5 - 15  Ammonia     Status: Abnormal   Collection Time: 07/05/22  3:58 PM  Result Value Ref Range   Ammonia 47 (H) 9 - 35 umol/L  CK     Status: Abnormal   Collection Time: 07/06/22   2:48 AM  Result Value Ref Range   Total CK 405 (H) 49 - 397 U/L  Comprehensive metabolic panel     Status: Abnormal   Collection Time: 07/06/22  2:48 AM  Result Value Ref Range   Sodium 138 135 - 145 mmol/L   Potassium 3.2 (L) 3.5 - 5.1 mmol/L   Chloride 107 98 - 111 mmol/L   CO2 24 22 - 32 mmol/L   Glucose, Bld 116 (H) 70 - 99 mg/dL   BUN 9 6 - 20 mg/dL   Creatinine, Ser 9.98 0.61 - 1.24 mg/dL   Calcium 7.8 (L) 8.9 - 10.3 mg/dL   Total Protein 4.9 (L) 6.5 - 8.1 g/dL   Albumin 2.6 (L) 3.5 - 5.0 g/dL   AST 93 (H) 15 - 41 U/L   ALT 49 (H) 0 - 44 U/L   Alkaline Phosphatase 58 38 - 126 U/L   Total Bilirubin 1.7 (H) 0.3 - 1.2 mg/dL   GFR, Estimated >33 >82 mL/min   Anion gap 7 5 - 15  CBC     Status: Abnormal   Collection Time: 07/06/22  2:48 AM  Result Value Ref Range   WBC 5.1 4.0 - 10.5 K/uL   RBC 2.90 (L) 4.22 - 5.81 MIL/uL   Hemoglobin 8.5 (L) 13.0 - 17.0 g/dL   HCT 50.5 (L) 39.7 - 67.3 %   MCV 86.9 80.0 - 100.0 fL   MCH 29.3 26.0 - 34.0 pg   MCHC 33.7 30.0 - 36.0 g/dL   RDW 41.9 (H) 37.9 - 02.4 %   Platelets  101 (L) 150 - 400 K/uL   nRBC 0.6 (H) 0.0 - 0.2 %     PHYSICAL EXAM:   Gen: in bed, NAD, sleeping, easily arousable, remains calm.  Soft mitts in place   Lungs: unlabored Abd: NG tube in place  Ext:       Left lower Extremity              Vac with good seal              knee immobilizer in place              Blood drainage on dressings but stable              Ext warm              Mild swelling to foot and ankle             + DP pulse             continues to have improved motor function with ankle extension and flexion              Great toe extension intact             DPN, SPN sensation nearly normal per his report             TN sensation appears symmetric to contralateral side now   Assessment/Plan: 1 Day Post-Op   Principal Problem:   Observation after surgery Active Problems:   Tibial plateau fracture, left   Alcohol use   Transaminitis    Compartment syndrome of left lower extremity, initial encounter ([name])   Adrenal nodule ([name])   Compression fracture of T7 vertebra ([name])   Thrombocytopenia ([name])   Anxiety   Traumatic rhabdomyolysis ([name])   Ileus ([name])   Acute hepatic encephalopathy ([name])   Alcohol withdrawal ([name])   Acute anemia   Psoriasis   Anti-infectives (From admission, onward)    Start     Dose/Rate Route Frequency Ordered Stop   07/05/22 1800  ceFAZolin (ANCEF) IVPB 2g/100 mL premix        2 g 200 mL/hr over 30 Minutes Intravenous Every 8 hours 07/05/22 1540 07/06/22 2159   07/05/22 0600  ceFAZolin (ANCEF) IVPB 2g/100 mL premix        2 g 200 mL/hr over 30 Minutes Intravenous On call to O.R. 07/04/22 1225 07/05/22 0920   07/02/22 1830  ceFAZolin (ANCEF) IVPB 1 g/50 mL premix        1 g 100 mL/hr over 30 Minutes Intravenous Every 6 hours 07/02/22 1731 07/03/22 2006   07/02/22 1430  ceFAZolin (ANCEF) IVPB 1 g/50 mL premix        1 g 100 mL/hr over 30 Minutes Intravenous  Once 07/02/22 1419 07/03/22 2006     .  POD/HD#: 1    41 y/o male, alcoholic, s/p fall with closed L bicondylar tibial plateau fracture and acute compartment syndrome left leg    -fall   - L bicondylar tibial plateau fracture s/p external fixation and acute compartment syndrome s/p 4 compartment fasciotomies              NWB L leg             Ice and elevate              continue with knee immobilizer    Will order hinged brace tomorrow  Return to OR tomorrow for attempted closure of remaining open fasciotomy. May need STSG               Therapies                Motor function continues to improve     - EtOH abuse             CIWA             Pt wants to stop              SW consulted                active withdrawals                Continue care in 4NP   - T7 compression fracture              No pain              Do not think this is acute              No further intervention    - Rhabdomyolysis               CK steady              Continue with IVF   - transaminitis              Likely multifactorial---> etoh, rhabdo    - thrombocytopenia              platelets just above 100k. Will continue to hold anticoagulation      - Pain management:             Multimodal    - ABL anemia/Hemodynamics             monitor   Cbc in am               - Medical issues              Per medicine               - DVT/PE prophylaxis:             Scd R leg             Hold lovenox due to thrombocytopenia  - ID:              Periop abx    - Metabolic Bone Disease:             Vitamin d deficiency                          Supplement     - Impediments to fracture healing:             EtOH abuse             Vitamin d deficiency    - Dispo:             Therapies             Continue with inpatient care              OR tomorrow  Mearl Latin, PA-C [phone] (C) 07/06/2022, 9:47 AM  [facility] [address] [place] [address] [phone] (F)    After 5pm and on the weekends please log on to Amion, go to orthopaedics and the  look under the [facility] Call for the provider(s) on call. You can also call our office at [phone] and then follow the prompts to be connected to the call team.   Patient ID: Ryan Lynch, male   DOB: 06-Nov-1981, 41 y.o.   MRN: [id number]

## 2022-07-06 NOTE — Progress Notes (Signed)
Occupational Therapy Treatment Patient Details Name: Ryan Lynch MRN: [id number] DOB: 1980/11/18 Today's Date: 07/06/2022   History of present illness 41 yo male admitted following fall down the stairs with subsequent tibial plateua fx requiring 4 compartment fasciotomies and placement of ex fix. PMH includes anxiety, ACL repair, psoriasis and daily consumption of alcohol.   OT comments  Pt very fatigued this session and limited to the EOB. Pt with cognitive and balance deficits. Recommendation CIR.    Recommendations for follow up therapy are one component of a multi-disciplinary discharge planning process, led by the attending physician.  Recommendations may be updated based on patient status, additional functional criteria and insurance authorization.    Follow Up Recommendations  Acute inpatient rehab (3hours/day)    Assistance Recommended at Discharge Frequent or constant Supervision/Assistance  Patient can return home with the following  Two people to help with walking and/or transfers;Two people to help with bathing/dressing/bathroom;Assist for transportation   Equipment Recommendations  BSC/3in1;Wheelchair (measurements OT);Wheelchair cushion (measurements OT);Hospital bed    Recommendations for Other Services Rehab consult    Precautions / Restrictions Precautions Precautions: Fall;Other (comment) Precaution Comments: wound vac, watch vitals, KI LLE, bil mittens, NG tube, internal foley Restrictions Weight Bearing Restrictions: Yes LLE Weight Bearing: Non weight bearing       Mobility Bed Mobility Overal bed mobility: Needs Assistance Bed Mobility: Supine to Sit, Sit to Supine     Supine to sit: Mod assist, HOB elevated Sit to supine: Mod assist   General bed mobility comments: pt able to bridge with L LE and behind tilted to slide himself to the top of the bed. Pt requires total management of the LLE KI    Transfers                   General transfer  comment: not attempted this session     Balance Overall balance assessment: Needs assistance Sitting-balance support: Bilateral upper extremity supported, Feet supported Sitting balance-Leahy Scale: Fair                                     ADL either performed or assessed with clinical judgement   ADL Overall ADL's : Needs assistance/impaired                                       General ADL Comments: pt transfered to eob for static sitting and ankle pumps. pt internally motivated to return to supine and unable to sustain eob with +1 (A) safely so session terminated.    Extremity/Trunk Assessment Upper Extremity Assessment Upper Extremity Assessment: Overall WFL for tasks assessed   Lower Extremity Assessment Lower Extremity Assessment: Defer to PT evaluation        Vision       Perception     Praxis      Cognition Arousal/Alertness: Lethargic Behavior During Therapy: Flat affect Overall Cognitive Status: Impaired/Different from baseline Area of Impairment: Safety/judgement, Following commands, Attention, Awareness, Orientation                 Orientation Level: Disoriented to, Place, Time, Situation Current Attention Level: Sustained   Following Commands: Follows one step commands inconsistently, Follows one step commands with increased time Safety/Judgement: Decreased awareness of safety, Decreased awareness of deficits Awareness: Intellectual   General Comments: pt able to reports  his name and location as [place]. pt falling back to sleep and requires reposition to increase arousal. pt internally distracted to return to supine to go back to sleep.        Exercises Exercises: General Lower Extremity General Exercises - Lower Extremity Ankle Circles/Pumps: AROM, Left, 15 reps, Seated    Shoulder Instructions       General Comments noted to have drainage from LLE on bed surface. Heel floated on yellow foam. pt may  require pillow prafo for skin integrity management    Pertinent Vitals/ Pain       Pain Assessment Pain Assessment: No/denies pain  Home Living                                          Prior Functioning/Environment              Frequency  Min 2X/week        Progress Toward Goals  OT Goals(current goals can now be found in the care plan section)  Progress towards OT goals: Not progressing toward goals - comment  Acute Rehab OT Goals Patient Stated Goal: to go to sleep OT Goal Formulation: With patient Time For Goal Achievement: 07/17/22 Potential to Achieve Goals: Good ADL Goals Pt Will Perform Lower Body Bathing: with set-up;with adaptive equipment Pt Will Perform Lower Body Dressing: with set-up;with adaptive equipment Pt Will Transfer to Toilet: with set-up;bedside commode Pt Will Perform Toileting - Clothing Manipulation and hygiene: with set-up;sit to/from stand  Plan Discharge plan remains appropriate    Co-evaluation                 AM-PAC OT "6 Clicks" Daily Activity     Outcome Measure   Help from another person eating meals?: A Lot Help from another person taking care of personal grooming?: A Lot Help from another person toileting, which includes using toliet, bedpan, or urinal?: A Lot Help from another person bathing (including washing, rinsing, drying)?: A Lot Help from another person to put on and taking off regular upper body clothing?: A Lot   6 Click Score: 10    End of Session    OT Visit Diagnosis: Unsteadiness on feet (R26.81);Other abnormalities of gait and mobility (R26.89);Other symptoms and signs involving cognitive function   Activity Tolerance Patient tolerated treatment well;Patient limited by fatigue   Patient Left in bed;with call bell/phone within reach;with bed alarm set;with restraints reapplied   Nurse Communication Mobility status;Precautions;Weight bearing status        Time: 1138  (6767)-2094 OT Time Calculation (min): 11 min  Charges: OT General Charges $OT Visit: 1 Visit OT Treatments $Self Care/Home Management : 8-22 mins   Brynn, OTR/L  Acute Rehabilitation Services Office: [phone] .   Mateo Flow 07/06/2022, 2:27 PM
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
## 2022-07-06 NOTE — Anesthesia Postprocedure Evaluation (Signed)
Anesthesia Post Note  Patient: Ryan Lynch  Procedure(s) Performed: EXTERNAL FIXATION LEG (Left: Leg Lower) APPLICATION OF WOUND VAC (Left: Leg Lower) FASCIOTOMY (Left: Leg Lower) CLOSED REDUCTION TIBIA (Left: Leg Lower)     Patient location during evaluation: PACU Anesthesia Type: General Level of consciousness: responds to stimulation and patient cooperative Pain management: pain level controlled Vital Signs Assessment: post-procedure vital signs reviewed and stable Respiratory status: spontaneous breathing, nonlabored ventilation, respiratory function stable and patient connected to nasal cannula oxygen Cardiovascular status: blood pressure returned to baseline, stable and tachycardic Postop Assessment: no apparent nausea or vomiting Anesthetic complications: no           Glover Capano

## 2022-07-06 NOTE — Progress Notes (Signed)
Physical Therapy Treatment Patient Details Name: Ryan Lynch MRN: [id number] DOB: 06-21-1981 Today's Date: 07/06/2022   History of Present Illness 41 yo male admitted 07/02/22 following fall down the stairs with subsequent tibial plateua fx requiring 4 compartment fasciotomies and placement of ex fix. S/p ROIF tibial plateu fx 8/22. PMH includes anxiety, ACL repair, psoriasis and daily consumption of alcohol.    PT Comments    Pt is demonstrating deficits in memory, safety awareness, and problem-solving, needing repeated simple cues for most tasks. He also required mod-maxA for bed mobility today due to impaired cognition, strength, and balance. Pt declined to perform a full stand on his R leg, but did practice initiating transfers by clearing his buttocks from the bed surface several reps with L leg off the ground. Pt left with call bell within reach, mittens donned, and bed alarm on. Will continue to follow acutely. Updated d/c recs to AIR as pt is still requiring a fairly large amount of assistance and supervision and could really benefit from intensive therapy to maximize his safety and independence prior to d/c home.     Recommendations for follow up therapy are one component of a multi-disciplinary discharge planning process, led by the attending physician.  Recommendations may be updated based on patient status, additional functional criteria and insurance authorization.  Follow Up Recommendations  Acute inpatient rehab (3hours/day)     Assistance Recommended at Discharge Frequent or constant Supervision/Assistance  Patient can return home with the following A little help with walking and/or transfers;A little help with bathing/dressing/bathroom;Assistance with cooking/housework;Assist for transportation;Help with stairs or ramp for entrance;Direct supervision/assist for medications management;Direct supervision/assist for financial management   Equipment Recommendations  Rolling walker  (2 wheels);BSC/3in1;Wheelchair (measurements PT);Wheelchair cushion (measurements PT) (with elevating leg rests)    Recommendations for Other Services Rehab consult     Precautions / Restrictions Precautions Precautions: Fall;Other (comment) Precaution Comments: wound vac, watch HR/O2, NG tube, mittens Required Braces or Orthoses: Knee Immobilizer - Left Restrictions Weight Bearing Restrictions: Yes LLE Weight Bearing: Non weight bearing Other Position/Activity Restrictions: KI     Mobility  Bed Mobility Overal bed mobility: Needs Assistance Bed Mobility: Supine to Sit, Sit to Supine     Supine to sit: HOB elevated, Mod assist Sit to supine: Max assist, HOB elevated   General bed mobility comments: Pt requiring modA to manage L leg off EOB and ascend trunk with cues to utilize bed rails to pull on to sit up. MaxA to manage legs and trunk back to supine.    Transfers Overall transfer level: Needs assistance                 General transfer comment: Pt performing x3 mini buttock lifts off bed to practice initiation of transfer to stand, keeping L leg off ground, min guard. Pt deferred progression on transfer at this time due to pain.    Ambulation/Gait               General Gait Details: deferred   Stairs             Wheelchair Mobility    Modified Rankin (Stroke Patients Only)       Balance Overall balance assessment: Needs assistance Sitting-balance support: Feet unsupported, Single extremity supported, No upper extremity supported Sitting balance-Leahy Scale: Fair Sitting balance - Comments: Prefers UE support       Standing balance comment: deferred full stand  Cognition Arousal/Alertness: Awake/alert Behavior During Therapy: Restless Overall Cognitive Status: Impaired/Different from baseline Area of Impairment: Safety/judgement, Following commands, Attention, Awareness, Memory, Problem solving                    Current Attention Level: Sustained Memory: Decreased short-term memory, Decreased recall of precautions Following Commands: Follows one step commands inconsistently, Follows one step commands with increased time Safety/Judgement: Decreased awareness of deficits, Decreased awareness of safety Awareness: Intellectual Problem Solving: Slow processing, Difficulty sequencing, Requires verbal cues General Comments: Pt needs redirecting to task at hand often. Needs encouragement to participate.        Exercises Other Exercises Other Exercises: mini push ups to clear buttocks ~1-2 inches off bed to practice initiating transfers to stand, L leg off ground, 3x, min guard    General Comments General comments (skin integrity, edema, etc.): HR up to 120s      Pertinent Vitals/Pain Pain Assessment Pain Assessment: Faces Faces Pain Scale: Hurts little more Pain Location: LLE Pain Descriptors / Indicators: Operative site guarding, Discomfort, Grimacing, Guarding Pain Intervention(s): Limited activity within patient's tolerance, Monitored during session, Repositioned    Home Living                          Prior Function            PT Goals (current goals can now be found in the care plan section) Acute Rehab PT Goals Patient Stated Goal: to get stronger after surgery PT Goal Formulation: With patient Time For Goal Achievement: 07/17/22 Potential to Achieve Goals: Good Progress towards PT goals: Progressing toward goals    Frequency    Min 5X/week      PT Plan Discharge plan needs to be updated    Co-evaluation              AM-PAC PT "6 Clicks" Mobility   Outcome Measure  Help needed turning from your back to your side while in a flat bed without using bedrails?: A Little Help needed moving from lying on your back to sitting on the side of a flat bed without using bedrails?: A Little Help needed moving to and from a bed to a chair  (including a wheelchair)?: A Little Help needed standing up from a chair using your arms (e.g., wheelchair or bedside chair)?: A Little Help needed to walk in hospital room?: Total Help needed climbing 3-5 steps with a railing? : Total 6 Click Score: 14    End of Session Equipment Utilized During Treatment: Oxygen Activity Tolerance: Patient limited by lethargy;Patient limited by pain Patient left: in bed;with bed alarm set;with call bell/phone within reach;with restraints reapplied Nurse Communication: Mobility status PT Visit Diagnosis: Unsteadiness on feet (R26.81);Difficulty in walking, not elsewhere classified (R26.2);Pain Pain - Right/Left: Left Pain - part of body: Leg     Time: 3646-8032 PT Time Calculation (min) (ACUTE ONLY): 17 min  Charges:  $Therapeutic Activity: 8-22 mins                     Raymond Gurney, PT, DPT Acute Rehabilitation Services  Office: [phone]    Jewel Baize 07/06/2022, 5:45 PM

## 2022-07-06 NOTE — Progress Notes (Signed)
Inpatient Rehab Admissions Coordinator:   Per therapy recommendations, patient was screened for CIR candidacy by Megan Salon, MS, CCC-SLP . At this time, Pt. Has not tolerated OOB since 8/20and PT is recommending HH.   Pt. may have potential to progress to becoming a potential CIR candidate, so CIR admissions team will follow and monitor for progress and participation with therapies and place consult order if Pt. appears to be an appropriate candidate. Please contact me with any questions.

## 2022-07-07 ENCOUNTER — Inpatient Hospital Stay (HOSPITAL_COMMUNITY): Payer: BC Managed Care – PPO

## 2022-07-07 ENCOUNTER — Inpatient Hospital Stay (HOSPITAL_COMMUNITY): Payer: BC Managed Care – PPO | Admitting: Certified Registered Nurse Anesthetist

## 2022-07-07 ENCOUNTER — Encounter (HOSPITAL_COMMUNITY)
Admission: EM | Disposition: A | Discharge status: Rehabilitation Facility | Payer: Self-pay | Source: Home / Self Care | Attending: Orthopedic Surgery

## 2022-07-07 ENCOUNTER — Other Ambulatory Visit: Payer: Self-pay

## 2022-07-07 ENCOUNTER — Encounter (HOSPITAL_COMMUNITY): Payer: Self-pay | Admitting: Orthopedic Surgery

## 2022-07-07 DIAGNOSIS — Z4889 Encounter for other specified surgical aftercare: Secondary | ICD-10-CM | POA: Diagnosis not present

## 2022-07-07 DIAGNOSIS — N179 Acute kidney failure, unspecified: Secondary | ICD-10-CM

## 2022-07-07 HISTORY — PX: I & D EXTREMITY: SHX5045

## 2022-07-07 HISTORY — PX: SECONDARY CLOSURE OF WOUND: SHX6208

## 2022-07-07 LAB — CBC WITH DIFFERENTIAL/PLATELET
Abs Immature Granulocytes: 0.05 10*3/uL (ref 0.00–0.07)
Basophils Absolute: 0.1 10*3/uL (ref 0.0–0.1)
Basophils Relative: 1 %
Eosinophils Absolute: 0.3 10*3/uL (ref 0.0–0.5)
Eosinophils Relative: 3 %
HCT: 26 % — ABNORMAL LOW (ref 39.0–52.0)
Hemoglobin: 8.7 g/dL — ABNORMAL LOW (ref 13.0–17.0)
Immature Granulocytes: 1 %
Lymphocytes Relative: 12 %
Lymphs Abs: 1.3 10*3/uL (ref 0.7–4.0)
MCH: 33.9 pg (ref 26.0–34.0)
MCHC: 33.5 g/dL (ref 30.0–36.0)
MCV: 101.2 fL — ABNORMAL HIGH (ref 80.0–100.0)
Monocytes Absolute: 0.9 10*3/uL (ref 0.1–1.0)
Monocytes Relative: 9 %
Neutro Abs: 8 10*3/uL — ABNORMAL HIGH (ref 1.7–7.7)
Neutrophils Relative %: 74 %
Platelets: 132 10*3/uL — ABNORMAL LOW (ref 150–400)
RBC: 2.57 MIL/uL — ABNORMAL LOW (ref 4.22–5.81)
RDW: 13.2 % (ref 11.5–15.5)
WBC: 10.5 10*3/uL (ref 4.0–10.5)
nRBC: 0 % (ref 0.0–0.2)

## 2022-07-07 LAB — COMPREHENSIVE METABOLIC PANEL
ALT: 18 U/L (ref 0–44)
AST: 25 U/L (ref 15–41)
Albumin: 2.1 g/dL — ABNORMAL LOW (ref 3.5–5.0)
Alkaline Phosphatase: 64 U/L (ref 38–126)
Anion gap: 8 (ref 5–15)
BUN: 16 mg/dL (ref 6–20)
CO2: 25 mmol/L (ref 22–32)
Calcium: 8.5 mg/dL — ABNORMAL LOW (ref 8.9–10.3)
Chloride: 103 mmol/L (ref 98–111)
Creatinine, Ser: 1.97 mg/dL — ABNORMAL HIGH (ref 0.61–1.24)
GFR, Estimated: 43 mL/min — ABNORMAL LOW (ref >60)
Glucose, Bld: 91 mg/dL (ref 70–99)
Potassium: 4.4 mmol/L (ref 3.5–5.1)
Sodium: 136 mmol/L (ref 135–145)
Total Bilirubin: 0.8 mg/dL (ref 0.3–1.2)
Total Protein: 6.6 g/dL (ref 6.5–8.1)

## 2022-07-07 LAB — TYPE AND SCREEN
ABO/RH(D): O POS
Antibody Screen: NEGATIVE

## 2022-07-07 LAB — URINALYSIS, ROUTINE W REFLEX MICROSCOPIC
Bilirubin Urine: NEGATIVE
Glucose, UA: NEGATIVE mg/dL
Ketones, ur: 20 mg/dL — AB
Leukocytes,Ua: NEGATIVE
Nitrite: NEGATIVE
Protein, ur: NEGATIVE mg/dL
Specific Gravity, Urine: 1.016 (ref 1.005–1.030)
pH: 7 (ref 5.0–8.0)

## 2022-07-07 LAB — AMMONIA: Ammonia: 21 umol/L (ref 9–35)

## 2022-07-07 LAB — MAGNESIUM: Magnesium: 2 mg/dL (ref 1.7–2.4)

## 2022-07-07 LAB — PHOSPHORUS: Phosphorus: 3.7 mg/dL (ref 2.5–4.6)

## 2022-07-07 LAB — MYOGLOBIN, URINE: Myoglobin, Ur: 4 ng/mL (ref 0–13)

## 2022-07-07 SURGERY — IRRIGATION AND DEBRIDEMENT EXTREMITY
Anesthesia: General | Site: Leg Lower | Laterality: Left

## 2022-07-07 MED ORDER — DEXTROSE IN LACTATED RINGERS 5 % IV SOLN: INTRAVENOUS | Status: DC

## 2022-07-07 MED ORDER — CHLORHEXIDINE GLUCONATE CLOTH 2 % EX PADS
6.0000 | MEDICATED_PAD | Freq: Every day | CUTANEOUS | Status: DC
  Administered 2022-07-07 – 2022-07-16 (×9): 6 via TOPICAL

## 2022-07-07 MED ORDER — CHLORHEXIDINE GLUCONATE 0.12 % MT SOLN: 15.0000 mL | Freq: Once | OROMUCOSAL | Status: AC

## 2022-07-07 MED ORDER — ROCURONIUM BROMIDE 10 MG/ML (PF) SYRINGE
PREFILLED_SYRINGE | INTRAVENOUS | Status: DC | PRN
  Administered 2022-07-07: 50 mg via INTRAVENOUS

## 2022-07-07 MED ORDER — DEXAMETHASONE SODIUM PHOSPHATE 10 MG/ML IJ SOLN
INTRAMUSCULAR | Status: AC
  Filled 2022-07-07: qty 1

## 2022-07-07 MED ORDER — LACTATED RINGERS IV BOLUS
500.0000 mL | Freq: Once | INTRAVENOUS | Status: AC
Start: 2022-07-07 — End: 2022-07-07
  Administered 2022-07-07: 500 mL via INTRAVENOUS

## 2022-07-07 MED ORDER — DEXAMETHASONE SODIUM PHOSPHATE 10 MG/ML IJ SOLN
INTRAMUSCULAR | Status: DC | PRN
  Administered 2022-07-07: 5 mg via INTRAVENOUS

## 2022-07-07 MED ORDER — ONDANSETRON HCL 4 MG/2ML IJ SOLN
INTRAMUSCULAR | Status: AC
  Filled 2022-07-07: qty 2

## 2022-07-07 MED ORDER — DEXMEDETOMIDINE (PRECEDEX) IN NS 20 MCG/5ML (4 MCG/ML) IV SYRINGE
PREFILLED_SYRINGE | INTRAVENOUS | Status: DC | PRN
  Administered 2022-07-07: 4 ug via INTRAVENOUS
  Administered 2022-07-07: 8 ug via INTRAVENOUS

## 2022-07-07 MED ORDER — 0.9 % SODIUM CHLORIDE (POUR BTL) OPTIME
TOPICAL | Status: DC | PRN
  Administered 2022-07-07 (×2): 1000 mL

## 2022-07-07 MED ORDER — SUCCINYLCHOLINE CHLORIDE 200 MG/10ML IV SOSY
PREFILLED_SYRINGE | INTRAVENOUS | Status: DC | PRN
  Administered 2022-07-07: 120 mg via INTRAVENOUS

## 2022-07-07 MED ORDER — LACTATED RINGERS IV SOLN: INTRAVENOUS | Status: DC

## 2022-07-07 MED ORDER — PROPOFOL 10 MG/ML IV BOLUS
INTRAVENOUS | Status: DC | PRN
  Administered 2022-07-07: 200 mg via INTRAVENOUS

## 2022-07-07 MED ORDER — ONDANSETRON HCL 4 MG/2ML IJ SOLN
INTRAMUSCULAR | Status: DC | PRN
  Administered 2022-07-07: 4 mg via INTRAVENOUS

## 2022-07-07 MED ORDER — MIDAZOLAM HCL 2 MG/2ML IJ SOLN
INTRAMUSCULAR | Status: AC
  Filled 2022-07-07: qty 2

## 2022-07-07 MED ORDER — ORAL CARE MOUTH RINSE: 15.0000 mL | Freq: Once | OROMUCOSAL | Status: AC

## 2022-07-07 MED ORDER — FENTANYL CITRATE (PF) 250 MCG/5ML IJ SOLN
INTRAMUSCULAR | Status: AC
  Filled 2022-07-07: qty 5

## 2022-07-07 MED ORDER — CHLORHEXIDINE GLUCONATE 0.12 % MT SOLN
OROMUCOSAL | Status: AC
  Administered 2022-07-07: 15 mL via OROMUCOSAL
  Filled 2022-07-07: qty 15

## 2022-07-07 MED ORDER — SUGAMMADEX SODIUM 200 MG/2ML IV SOLN
INTRAVENOUS | Status: DC | PRN
  Administered 2022-07-07: 200 mg via INTRAVENOUS

## 2022-07-07 MED ORDER — LIDOCAINE 2% (20 MG/ML) 5 ML SYRINGE
INTRAMUSCULAR | Status: DC | PRN
  Administered 2022-07-07: 100 mg via INTRAVENOUS

## 2022-07-07 MED ORDER — MIDAZOLAM HCL 2 MG/2ML IJ SOLN
INTRAMUSCULAR | Status: DC | PRN
  Administered 2022-07-07 (×2): 1 mg via INTRAVENOUS

## 2022-07-07 MED ORDER — FENTANYL CITRATE (PF) 250 MCG/5ML IJ SOLN
INTRAMUSCULAR | Status: DC | PRN
Start: 2022-07-07 — End: 2022-07-07
  Administered 2022-07-07: 100 ug via INTRAVENOUS

## 2022-07-07 MED ORDER — DEXMEDETOMIDINE HCL IN NACL 80 MCG/20ML IV SOLN
INTRAVENOUS | Status: AC
  Filled 2022-07-07: qty 20

## 2022-07-07 SURGICAL SUPPLY — 79 items
ABDOMINAL PAD ABD ×3 IMPLANT
BAG COUNTER SPONGE SURGICOUNT (BAG) ×1 IMPLANT
BAG SPNG CNTER NS LX DISP (BAG)
BALL CTTN LRG ABS STRL LF (GAUZE/BANDAGES/DRESSINGS)
BANDAGE GAUZE ELAST BULKY 4 IN (GAUZE/BANDAGES/DRESSINGS) ×1 IMPLANT
BLADE CLIPPER SURG (BLADE) IMPLANT
BLADE SURG 10 STRL SS (BLADE) ×1 IMPLANT
BNDG COHESIVE 4X5 TAN STRL (GAUZE/BANDAGES/DRESSINGS) ×2 IMPLANT
BNDG ELASTIC 4X5.8 VLCR STR LF (GAUZE/BANDAGES/DRESSINGS) ×1 IMPLANT
BNDG ELASTIC 6X5.8 VLCR STR LF (GAUZE/BANDAGES/DRESSINGS) ×3 IMPLANT
BNDG GAUZE ELAST 4 BULKY (GAUZE/BANDAGES/DRESSINGS) ×5 IMPLANT
BRUSH SCRUB EZ PLAIN DRY (MISCELLANEOUS) ×3 IMPLANT
CANISTER SUCT 3000ML PPV (MISCELLANEOUS) IMPLANT
COTTONBALL LRG STERILE PKG (GAUZE/BANDAGES/DRESSINGS) ×2 IMPLANT
COVER SURGICAL LIGHT HANDLE (MISCELLANEOUS) ×3 IMPLANT
DERMACARRIERS GRAFT 1 TO 1.5 (DISPOSABLE)
DRAPE C-ARMOR (DRAPES) ×2 IMPLANT
DRAPE HALF SHEET 40X57 (DRAPES) ×4 IMPLANT
DRAPE ORTHO SPLIT 77X108 STRL (DRAPES)
DRAPE SURG ORHT 6 SPLT 77X108 (DRAPES) ×1 IMPLANT
DRAPE U-SHAPE 47X51 STRL (DRAPES) ×2 IMPLANT
DRSG ADAPTIC 3X8 NADH LF (GAUZE/BANDAGES/DRESSINGS) ×2 IMPLANT
DRSG MEPITEL 4X7.2 (GAUZE/BANDAGES/DRESSINGS) ×2 IMPLANT
DRSG PAD ABDOMINAL 8X10 ST (GAUZE/BANDAGES/DRESSINGS) ×2 IMPLANT
ELECT CAUTERY BLADE 6.4 (BLADE) IMPLANT
ELECT REM PT RETURN 9FT ADLT (ELECTROSURGICAL)
ELECTRODE REM PT RTRN 9FT ADLT (ELECTROSURGICAL) IMPLANT
GAUZE 4X4 16PLY ~~LOC~~+RFID DBL (SPONGE) ×1 IMPLANT
GAUZE SPONGE 4X4 12PLY STRL (GAUZE/BANDAGES/DRESSINGS) ×3 IMPLANT
GAUZE XEROFORM 5X9 LF (GAUZE/BANDAGES/DRESSINGS) IMPLANT
GLOVE BIO SURGEON STRL SZ7.5 (GLOVE) ×2 IMPLANT
GLOVE BIO SURGEON STRL SZ8 (GLOVE) ×2 IMPLANT
GLOVE BIOGEL PI IND STRL 7.5 (GLOVE) ×2 IMPLANT
GLOVE BIOGEL PI IND STRL 8 (GLOVE) ×2 IMPLANT
GLOVE BIOGEL PI IND STRL 9 (GLOVE) ×2 IMPLANT
GLOVE BIOGEL PI INDICATOR 7.5 (GLOVE) ×2
GLOVE BIOGEL PI INDICATOR 8 (GLOVE) ×2
GLOVE BIOGEL PI INDICATOR 9 (GLOVE) ×2
GLOVE SURG ORTHO LTX SZ7.5 (GLOVE) ×4 IMPLANT
GOWN STRL REUS W/ TWL LRG LVL3 (GOWN DISPOSABLE) ×2 IMPLANT
GOWN STRL REUS W/ TWL XL LVL3 (GOWN DISPOSABLE) ×2 IMPLANT
GOWN STRL REUS W/TWL LRG LVL3 (GOWN DISPOSABLE)
GOWN STRL REUS W/TWL XL LVL3 (GOWN DISPOSABLE) ×2
GRAFT DERMACARRIERS 1 TO 1.5 (DISPOSABLE) ×1 IMPLANT
HANDPIECE INTERPULSE COAX TIP (DISPOSABLE)
KIT BASIN OR (CUSTOM PROCEDURE TRAY) ×2 IMPLANT
KIT TURNOVER KIT B (KITS) ×2 IMPLANT
MANIFOLD NEPTUNE II (INSTRUMENTS) ×1 IMPLANT
NS IRRIG 1000ML POUR BTL (IV SOLUTION) ×2 IMPLANT
PACK GENERAL/GYN (CUSTOM PROCEDURE TRAY) ×2 IMPLANT
PACK ORTHO EXTREMITY (CUSTOM PROCEDURE TRAY) ×2 IMPLANT
PAD ARMBOARD 7.5X6 YLW CONV (MISCELLANEOUS) ×3 IMPLANT
PAD CAST 4YDX4 CTTN HI CHSV (CAST SUPPLIES) ×1 IMPLANT
PAD CAST CTTN 3X4 STRL (SOFTGOODS) ×1 IMPLANT
PADDING CAST COTTON 3X4 STRL (SOFTGOODS) ×2
PADDING CAST COTTON 4X4 STRL (CAST SUPPLIES)
PADDING CAST COTTON 6X4 ST (SOFTGOODS) ×1 IMPLANT
PADDING CAST COTTON 6X4 STRL (CAST SUPPLIES) ×2 IMPLANT
SET HNDPC FAN SPRY TIP SCT (DISPOSABLE) IMPLANT
SOL PREP POV-IOD 4OZ 10% (MISCELLANEOUS) ×2 IMPLANT
SOL SCRUB PVP POV-IOD 4OZ 7.5% (MISCELLANEOUS) ×2
SOLUTION SCRB POV-IOD 4OZ 7.5% (MISCELLANEOUS) ×2 IMPLANT
SPONGE T-LAP 18X18 ~~LOC~~+RFID (SPONGE) ×1 IMPLANT
STAPLER VISISTAT (STAPLE) ×2 IMPLANT
STAPLER VISISTAT 35W (STAPLE) ×2 IMPLANT
STOCKINETTE 4X48 STRL (DRAPES) ×2 IMPLANT
STOCKINETTE IMPERVIOUS 9X36 MD (GAUZE/BANDAGES/DRESSINGS) ×2 IMPLANT
STOCKINETTE IMPERVIOUS LG (DRAPES) ×1 IMPLANT
SUT ETHILON 1 TP 1 60 (SUTURE) ×2 IMPLANT
SUT ETHILON 2 0 PSLX (SUTURE) ×3 IMPLANT
SUT ETHILON 3 0 FSL (SUTURE) IMPLANT
SUT PDS AB 2-0 CT1 27 (SUTURE) ×1 IMPLANT
SWAB CULTURE ESWAB REG 1ML (MISCELLANEOUS) IMPLANT
TOWEL GREEN STERILE (TOWEL DISPOSABLE) ×3 IMPLANT
TOWEL GREEN STERILE FF (TOWEL DISPOSABLE) ×1 IMPLANT
TUBE CONNECTING 12X1/4 (SUCTIONS) ×2 IMPLANT
UNDERPAD 30X36 HEAVY ABSORB (UNDERPADS AND DIAPERS) ×2 IMPLANT
WATER STERILE IRR 1000ML POUR (IV SOLUTION) ×1 IMPLANT
YANKAUER SUCT BULB TIP NO VENT (SUCTIONS) ×2 IMPLANT

## 2022-07-07 NOTE — Transfer of Care (Signed)
Immediate Anesthesia Transfer of Care Note  Patient: Raylen Tangonan  Procedure(s) Performed: IRRIGATION AND DEBRIDEMENT EXTREMITY (Left: Leg Lower) SECONDARY CLOSURE OF WOUND (Left: Leg Lower)  Patient Location: PACU  Anesthesia Type:General  Level of Consciousness: drowsy  Airway & Oxygen Therapy: Patient Spontanous Breathing and Patient connected to face mask oxygen  Post-op Assessment: Report given to RN and Post -op Vital signs reviewed and stable  Post vital signs: Reviewed and stable  Last Vitals:  Vitals Value Taken Time  BP 138/68 07/07/22 1242  Temp 98   Pulse 110 07/07/22 1244  Resp 30 07/07/22 1244  SpO2 94 % 07/07/22 1244  Vitals shown include unvalidated device data.  Last Pain:  Vitals:   07/07/22 1025  TempSrc: Oral  PainSc: 0-No pain      Patients Stated Pain Goal: 2 (07/07/22 1025)  Complications: No notable events documented.

## 2022-07-07 NOTE — Progress Notes (Signed)
1935: this RN walked into pt room, pt had NGT laying on his chest, mittens and restraints were on, although restraints were tied loosely, MD made aware, no new recommendations  2100: gave pt his scheduled ativan, pt resting in bed with call bell in reach

## 2022-07-07 NOTE — Anesthesia Postprocedure Evaluation (Signed)
Anesthesia Post Note  Patient: Rivan Siordia  Procedure(s) Performed: IRRIGATION AND DEBRIDEMENT EXTREMITY (Left: Leg Lower) SECONDARY CLOSURE OF WOUND (Left: Leg Lower)     Patient location during evaluation: PACU Anesthesia Type: General Level of consciousness: awake and alert Pain management: pain level controlled Vital Signs Assessment: post-procedure vital signs reviewed and stable Respiratory status: spontaneous breathing, nonlabored ventilation and respiratory function stable Cardiovascular status: blood pressure returned to baseline and tachycardic Postop Assessment: no apparent nausea or vomiting Anesthetic complications: no   No notable events documented.  Last Vitals:  Vitals:   07/07/22 1245 07/07/22 1300  BP: 126/72 134/83  Pulse: (!) 110 (!) 107  Resp: (!) 21 (!) 26  Temp:    SpO2: 94% 92%    Last Pain:  Vitals:   07/07/22 1300  TempSrc:   PainSc: 2                  Shanda Howells

## 2022-07-07 NOTE — Plan of Care (Signed)

## 2022-07-07 NOTE — Anesthesia Procedure Notes (Addendum)
Procedure Name: Intubation Date/Time: 07/07/2022 11:11 AM  Performed by: Audie Pinto, CRNAPre-anesthesia Checklist: Patient identified, Emergency Drugs available, Suction available and Patient being monitored Patient Re-evaluated:Patient Re-evaluated prior to induction Oxygen Delivery Method: Circle system utilized Preoxygenation: Pre-oxygenation with 100% oxygen Induction Type: IV induction and Rapid sequence Laryngoscope Size: Glidescope and 3 Grade View: Grade I Tube type: Oral Tube size: 7.5 mm Number of attempts: 1 Airway Equipment and Method: Stylet and Oral airway Placement Confirmation: ETT inserted through vocal cords under direct vision, positive ETCO2 and breath sounds checked- equal and bilateral Secured at: 22 cm Tube secured with: Tape Dental Injury: Teeth and Oropharynx as per pre-operative assessment  Comments: Elective glidescope. NGT pulled out by patient prior to anesthesia start. RSI, elective glidescope for direct visualization of oropharynx with induction

## 2022-07-07 NOTE — Progress Notes (Addendum)
PROGRESS NOTE    Ryan Lynch  Ryan Lynch DOB: 1981/07/18 DOA: 07/02/2022 PCP: Patient, No Pcp Per  Chief Complaint  Patient presents with   Fall    Brief Narrative:  Ryan Lynch is Ryan Lynch 41 y.o. male with Ryan Lynch history of alcohol use and anxiety. Patient presented secondary to Ryan Lynch fall down his stairs and subsequent worsening pain. Upon arrival to the ED, he was found to have evidence of Ryan Lynch left tibial fracture and evidence of compartment syndrome, requiring emergent fasciotomy. Patient has developed moderate alcohol withdrawal symptoms.    Assessment & Plan:   Principal Problem:   Observation after surgery Active Problems:   Tibial plateau fracture, left   Alcohol withdrawal ([name])   Alcohol use   Compartment syndrome of left lower extremity, initial encounter ([name])   Ileus ([name])   AKI (acute kidney injury) ([name])   Transaminitis   Traumatic rhabdomyolysis ([name])   Acute hepatic encephalopathy ([name])   Acute anemia   Adrenal nodule ([name])   Thrombocytopenia ([name])   Compression fracture of T7 vertebra ([name])   Anxiety   Psoriasis   Assessment and Plan: Tibial plateau fracture, left Patient admitted to trauma service. S/p emergent fasciotomy on 8/19 for compartment syndrome. Management per primary  surgery planned for 8/24, note pending  Alcohol withdrawal ([name]) Librium discontinued  Currently on ativan per ciwa  High dose thiamine  multivitamin and folic acid He's currently in wrist restraints.  Will have low threshold to discuss with PCCM regarding precedex   Alcohol use Treating withdrawal as above  Daily consumption. Alcohol level undetectable on admission. Patient hoping to detox. Started on CIWA on admission. Social work consulted.  AKI (acute kidney injury) ([name]) IV fluids Bladder scan CT abd/pelvis 8/21 without hydro, if not improving, will need renal US UA   Ileus ([name]) Likely multifactorial in setting of surgery and narcotics. No abdominal pain or emesis.  CT  abd/pelvis with evidence of ileus 8/24, increase in amount of small bowel gas suggesting ileus or partial obstruction  Repeat plain films in AM 2 unmeasured stools noted in past 24 hrs, will discuss with RN NG tube replaced Continue NPO, NGT to LIS   Compartment syndrome of left lower extremity, initial encounter ([name]) See problem, Tibial plateau fracture, left.  Acute anemia S/p 3 units pRBC trend  Acute hepatic encephalopathy ([name]) Patient with an ammonia of 96 on admission but without significant confusion at that time. He is oriented, but is starting to seem somewhat confused/slow to respond. Likely multifactorial with alcohol withdrawal Ammonia normalized without any intervention  Traumatic rhabdomyolysis ([name]) Secondary to fall down the stairs. Resultant compartment syndrome. CK of 1,513, down to 975 but has increased.  CK improved  Continue IVF   Transaminitis AST/ALT elevated to 188/59 on admission respectively Secondary to rhabdomyolysis from compartment syndrome and possibly alcohol use.  improving  Thrombocytopenia ([name]) Likely related to trauma and blood loss. Patient with evidence of hepatic steatosis and mild splenomegaly on imaging. No prior labs available. Platelets trended down but appear to have stabilized. -Trend CBC -Watch for bleeding  Adrenal nodule ([name]) Bilateral with Ryan Lynch left 16 mm indeterminate left adrenal hypodense mass and Ryan Lynch right 10 mm indeterminate right adrenal gland mass. Recommendations for outpatient non-emergent CT or MRI of the adrenal glands.  Compression fracture of T7 vertebra ([name]) -Pain management per primary  Anxiety -Continue Zoloft -Hold Klonopin while on CIWA/Librium taper  Psoriasis Noted on exam and now notice in history. Not on medication therapy per medication  history.      DVT prophylaxis: SCD, per primary Code Status: full Family Communication: none - father over phone Disposition:   Status is: Inpatient Remains  inpatient appropriate because: additional treatment for encephalopathy, ortho   Consultants:  orthopedics  Procedures:  8/19 1. EMERGENT FOUR COMPARTMENT FASCIOTOMIES LEFT LEG  Antimicrobials:  Anti-infectives (From admission, onward)    Start     Dose/Rate Route Frequency Ordered Stop   07/07/22 1000  ceFAZolin (ANCEF) IVPB 2g/100 mL premix        2 g 200 mL/hr over 30 Minutes Intravenous On call to O.R. 07/06/22 1001 07/07/22 1136   07/05/22 1800  ceFAZolin (ANCEF) IVPB 2g/100 mL premix        2 g 200 mL/hr over 30 Minutes Intravenous Every 8 hours 07/05/22 1540 07/06/22 1445   07/05/22 0600  ceFAZolin (ANCEF) IVPB 2g/100 mL premix        2 g 200 mL/hr over 30 Minutes Intravenous On call to O.R. 07/04/22 1225 07/05/22 0920   07/02/22 1830  ceFAZolin (ANCEF) IVPB 1 g/50 mL premix        1 g 100 mL/hr over 30 Minutes Intravenous Every 6 hours 07/02/22 1731 07/03/22 2006   07/02/22 1430  ceFAZolin (ANCEF) IVPB 1 g/50 mL premix        1 g 100 mL/hr over 30 Minutes Intravenous  Once 07/02/22 1419 07/03/22 2006       Subjective: Lethargic   Objective: Vitals:   07/07/22 1330 07/07/22 1345 07/07/22 1400 07/07/22 1450  BP: 126/80 118/70 107/69 120/76  Pulse: (!) 104 (!) 103 (!) 104 (!) 102  Resp: (!) 23 (!) 28 20   Temp:    98.2 F (36.8 C)  TempSrc:    Oral  SpO2: 96% 93% 94% 96%  Weight:      Height:        Intake/Output Summary (Last 24 hours) at 07/07/2022 1634 Last data filed at 07/07/2022 1605 Gross per 24 hour  Intake 2644 ml  Output 2245 ml  Net 399 ml   Filed Weights   07/02/22 1300 07/03/22 2222  Weight: 86.2 kg 91.6 kg    Examination:  General: No acute distress. Cardiovascular: RRR Lungs: unlabored Neurological: lethargic, moving all extremities Extremities: No clubbing or cyanosis. No edema.   Data Reviewed: I have personally reviewed following labs and imaging studies  CBC: Recent Labs  Lab 07/03/22 0251 07/04/22 0231 07/05/22 0907  07/05/22 1133 07/05/22 1558 07/06/22 0248 07/07/22 0159  WBC 5.1 5.9  --   --  6.4 5.1 10.5  NEUTROABS  --  4.8  --   --   --   --  8.0*  HGB 8.0* 7.1* 9.9* 7.8* 9.2* 8.5* 8.7*  HCT 24.0* 21.9* 29.0* 23.0* 27.0* 25.2* 26.0*  MCV 90.6 91.3  --   --  85.4 86.9 101.2*  PLT 72* 82*  --   --  113* 101* 132*    Basic Metabolic Panel: Recent Labs  Lab 07/03/22 0251 07/04/22 0231 07/05/22 0907 07/05/22 1133 07/05/22 1558 07/06/22 0248 07/07/22 0159  NA 135 134* 136 135 137 138 136  K 4.3 3.9 3.6 3.6 3.4* 3.2* 4.4  CL 106 105 100  --  105 107 103  CO2 21* 22  --   --  23 24 25   GLUCOSE 125* 129* 127*  --  122* 116* 91  BUN 9 12 12   --  11 9 16   CREATININE 0.74 0.83 0.50*  --  0.75 0.75 1.97*  CALCIUM 7.7* 8.1*  --   --  7.8* 7.8* 8.5*  MG  --   --   --   --   --   --  2.0  PHOS  --   --   --   --  2.6  --  3.7    GFR: Estimated Creatinine Clearance: 56.5 mL/min (Oluwaseun Cremer) (by C-G formula based on SCr of 1.97 mg/dL (H)).  Liver Function Tests: Recent Labs  Lab 07/03/22 0251 07/04/22 0231 07/05/22 1558 07/06/22 0248 07/07/22 0159  AST 133* 171* 113* 93* 25  ALT 41 47* 53* 49* 18  ALKPHOS 57 66 56 58 64  BILITOT 2.1* 2.0* 1.6* 1.7* 0.8  PROT 5.2* 5.3* 5.1* 4.9* 6.6  ALBUMIN 2.7* 2.8* 2.7*  2.7* 2.6* 2.1*    CBG: Recent Labs  Lab 07/04/22 2059  GLUCAP 128*     Recent Results (from the past 240 hour(s))  Resp Panel by RT-PCR (Flu Shamond Skelton&B, Covid) Anterior Nasal Swab     Status: None   Collection Time: 07/02/22 10:44 AM   Specimen: Anterior Nasal Swab  Result Value Ref Range Status   SARS Coronavirus 2 by RT PCR NEGATIVE NEGATIVE Final    Comment: (NOTE) SARS-CoV-2 target nucleic acids are NOT DETECTED.  The SARS-CoV-2 RNA is generally detectable in upper respiratory specimens during the acute phase of infection. The lowest concentration of SARS-CoV-2 viral copies this assay can detect is 138 copies/mL. Ellsworth Waldschmidt negative result does not preclude SARS-Cov-2 infection and  should not be used as the sole basis for treatment or other patient management decisions. Makaila Windle negative result may occur with  improper specimen collection/handling, submission of specimen other than nasopharyngeal swab, presence of viral mutation(s) within the areas targeted by this assay, and inadequate number of viral copies(<138 copies/mL). Jerik Falletta negative result must be combined with clinical observations, patient history, and epidemiological information. The expected result is Negative.  Fact Sheet for Patients:  BloggerCourse.com  Fact Sheet for Healthcare Providers:  SeriousBroker.it  This test is no t yet approved or cleared by the [place] FDA and  has been authorized for detection and/or diagnosis of SARS-CoV-2 by FDA under an Emergency Use Authorization (EUA). This EUA will remain  in effect (meaning this test can be used) for the duration of the COVID-19 declaration under Section 564(b)(1) of the Act, 21 U.S.C.section 360bbb-3(b)(1), unless the authorization is terminated  or revoked sooner.       Influenza Dijon Cosens by PCR NEGATIVE NEGATIVE Final   Influenza B by PCR NEGATIVE NEGATIVE Final    Comment: (NOTE) The Xpert Xpress SARS-CoV-2/FLU/RSV plus assay is intended as an aid in the diagnosis of influenza from Nasopharyngeal swab specimens and should not be used as Juwan Vences sole basis for treatment. Nasal washings and aspirates are unacceptable for Xpert Xpress SARS-CoV-2/FLU/RSV testing.  Fact Sheet for Patients: BloggerCourse.com  Fact Sheet for Healthcare Providers: SeriousBroker.it  This test is not yet approved or cleared by the [place] FDA and has been authorized for detection and/or diagnosis of SARS-CoV-2 by FDA under an Emergency Use Authorization (EUA). This EUA will remain in effect (meaning this test can be used) for the duration of the COVID-19 declaration  under Section 564(b)(1) of the Act, 21 U.S.C. section 360bbb-3(b)(1), unless the authorization is terminated or revoked.  Performed at [facility], [address], [place], [place] [address]          Radiology Studies: DG Abd 1 View  Result  Date: 07/07/2022 CLINICAL DATA:  Nasogastric tube placement. EXAM: ABDOMEN - 1 VIEW COMPARISON:  Same day. FINDINGS: Distal tip of nasogastric tube is seen in expected position of proximal stomach. Distal side hole is in expected position of distal esophagus. Stable small bowel dilatation is noted. IMPRESSION: Distal tip of nasogastric tube is seen in expected position of proximal stomach. Distal side hole is seen in expected position of distal esophagus. Electronically Signed   By: Lupita Raider M.D.   On: 07/07/2022 13:05   DG Abd 1 View  Result Date: 07/07/2022 CLINICAL DATA:  Abdominal distention EXAM: ABDOMEN - 1 VIEW COMPARISON:  Previous studies including the examination of 07/06/2022 FINDINGS: There is interval increase in small bowel dilation. Dilated small bowel loops measure up to 5.1 cm in diameter. Gas is present in colon. Stomach is not distended. There is interval removal of enteric tube. IMPRESSION: There is slight increase in amount of small bowel gas suggesting ileus or partial obstruction. Electronically Signed   By: Ernie Avena M.D.   On: 07/07/2022 08:08   DG CHEST PORT 1 VIEW  Result Date: 07/07/2022 CLINICAL DATA:  Difficulty breathing EXAM: PORTABLE CHEST 1 VIEW COMPARISON:  Previous studies including the examination of 07/04/2022 FINDINGS: There is interval removal of enteric tube. There are linear densities in the lower lung fields with no significant change. There are no signs of pulmonary edema. There is poor inspiration. No new infiltrates are seen. There is no pleural effusion or pneumothorax. IMPRESSION: Linear densities in the lower lung fields suggest subsegmental atelectasis. Electronically  Signed   By: Ernie Avena M.D.   On: 07/07/2022 08:05   DG Abd Portable 1V  Result Date: 07/06/2022 CLINICAL DATA:  NG tube placement. EXAM: PORTABLE ABDOMEN - 1 VIEW COMPARISON:  Radiograph earlier today FINDINGS: Tip and side port of the enteric tube below the diaphragm in the stomach. Slight progression in gaseous distension of small bowel in the central abdomen. Calcification in the right upper quadrant related to the gallbladder is stable from prior CT. IMPRESSION: 1. Tip and side port of the enteric tube below the diaphragm in the stomach. 2. Slight progression in gaseous distension of small bowel in the central abdomen, progressive ileus versus obstruction. Electronically Signed   By: Narda Rutherford M.D.   On: 07/06/2022 17:55   DG Abd Portable 1V  Result Date: 07/06/2022 CLINICAL DATA:  NG tube EXAM: PORTABLE ABDOMEN - 1 VIEW COMPARISON:  07/05/2022 FINDINGS: NG tube remains position within the stomach. Numerous dilated loops of small bowel persist measuring up to 4.7 cm in diameter, previously 4.5 cm. Gaseous distension of the colon is also seen. Pattern remains more suggestive of ileus. IMPRESSION: NG tube remains within the stomach. Slight progression of small bowel distension. Overall pattern remains suggestive of ileus although partial or intermittent obstruction not excluded. Continued follow-up recommended. Electronically Signed   By: Duanne Guess D.O.   On: 07/06/2022 13:50        Scheduled Meds:  sodium chloride   Intravenous Once   sodium chloride   Intravenous Once   acetaminophen  650 mg Oral Once   vitamin C  1,000 mg Oral Daily   Chlorhexidine Gluconate Cloth  6 each Topical Daily   cholecalciferol  2,000 Units Oral BID   docusate sodium  100 mg Oral BID   folic acid  1 mg Oral Daily   furosemide  20 mg Intravenous Once   furosemide  20 mg Intravenous Once   LORazepam  0-4 mg Intravenous Q8H   methocarbamol  1,000 mg Oral TID   multivitamin with minerals   1 tablet Oral Daily   polyethylene glycol  17 g Oral Daily   pregabalin  75 mg Oral TID   sertraline  200 mg Oral Daily   [START ON 07/14/2022] thiamine  100 mg Oral Daily   zinc sulfate  220 mg Oral Daily   Continuous Infusions:  lactated ringers 150 mL/hr at 07/07/22 0128   lactated ringers     thiamine (VITAMIN B1) injection 500 mg (07/07/22 0504)   Followed by   Melene Muller ON 07/09/2022] thiamine (VITAMIN B1) injection       LOS: 5 days    Time spent: over 30 min    Lacretia Nicks, MD [facility]   To contact the attending provider between 7A-7P or the covering provider during after hours 7P-7A, please log into the web site [email or web address] and access using universal [place] password for that web site. If you do not have the password, please call the hospital operator.  07/07/2022, 4:34 PM

## 2022-07-07 NOTE — Assessment & Plan Note (Addendum)
IV fluids UA 20 mg/dl ketones, small Hbg, 69-48 RBC's, rare bacteria -> follow  Resolved, follow

## 2022-07-07 NOTE — Anesthesia Postprocedure Evaluation (Signed)
Anesthesia Post Note  Patient: Ryan Lynch  Procedure(s) Performed: OPEN REDUCTION INTERNAL FIXATION (ORIF) TIBIAL PLATEAU (Left: Leg Lower) SECONDARY CLOSURE OF WOUND (Left: Leg Lower) APPLICATION OF WOUND VAC (Left: Leg Lower)     Patient location during evaluation: PACU Anesthesia Type: General Level of consciousness: responds to stimulation and patient cooperative Pain management: pain level controlled Vital Signs Assessment: post-procedure vital signs reviewed and stable Respiratory status: spontaneous breathing, nonlabored ventilation and patient connected to nasal cannula oxygen Cardiovascular status: blood pressure returned to baseline and tachycardic Postop Assessment: no apparent nausea or vomiting Anesthetic complications: no   No notable events documented.      Maksim Peregoy

## 2022-07-07 NOTE — Progress Notes (Signed)
   07/07/22 1000  OT Visit Information  Last OT Received On 07/07/22  Assistance Needed +2  Reason Eval/Treat Not Completed Patient at procedure or test/ unavailable  History of Present Illness 41 yo male admitted 07/02/22 following fall down the stairs with subsequent tibial plateua fx requiring 4 compartment fasciotomies and placement of ex fix. S/p ROIF tibial plateu fx 8/22. PMH includes anxiety, ACL repair, psoriasis and daily consumption of alcohol.   Plan to reattempt at a later date.   Raynald Kemp, OT Acute Rehabilitation Services Office: [phone]

## 2022-07-07 NOTE — Anesthesia Preprocedure Evaluation (Signed)
Anesthesia Evaluation  Patient identified by MRN, date of birth, ID band Patient awake    Reviewed: Allergy & Precautions, NPO status , Patient's Chart, lab work & pertinent test results  History of Anesthesia Complications Negative for: history of anesthetic complications  Airway Mallampati: II  TM Distance: >3 FB Neck ROM: Full    Dental no notable dental hx.    Pulmonary  4L [place]   Pulmonary exam normal        Cardiovascular negative cardio ROS Normal cardiovascular exam     Neuro/Psych Anxiety negative neurological ROS     GI/Hepatic EtOH last 9 days ago, per hospitalist was disoriented, agitated last night, concern for worsening withdrawal. Currently A&Ox4, slight tremors in hands, mildly sedated but answering questions appropriately. NGT pulled out by patient last night and has been unable to be replaced, abdominal distention, will replace after RSI   Endo/Other  negative endocrine ROS  Renal/GU Renal disease (Cr 1.97)  negative genitourinary   Musculoskeletal negative musculoskeletal ROS (+)   Abdominal   Peds  Hematology  (+) Blood dyscrasia (Hgb 8.7, Plt 132k), anemia ,   Anesthesia Other Findings Day of surgery medications reviewed with patient.  Reproductive/Obstetrics negative OB ROS                            Anesthesia Physical Anesthesia Plan  ASA: 4  Anesthesia Plan: General   Post-op Pain Management:    Induction: Intravenous, Rapid sequence and Cricoid pressure planned  PONV Risk Score and Plan: 3 and Treatment may vary due to age or medical condition, Ondansetron, Dexamethasone and Midazolam  Airway Management Planned: Oral ETT and Video Laryngoscope Planned  Additional Equipment: None  Intra-op Plan:   Post-operative Plan: Extubation in OR  Informed Consent: I have reviewed the patients History and Physical, chart, labs and discussed the procedure  including the risks, benefits and alternatives for the proposed anesthesia with the patient or authorized representative who has indicated his/her understanding and acceptance.     Dental advisory given  Plan Discussed with: CRNA  Anesthesia Plan Comments:         Anesthesia Quick Evaluation

## 2022-07-07 NOTE — Progress Notes (Signed)
PT Cancellation Note  Patient Details Name: Ryan Lynch MRN: [id number] DOB: 02-12-1981   Cancelled Treatment:    Reason Eval/Treat Not Completed: Patient at procedure or test/unavailable. Will plan to follow-up later as time permits.   Raymond Gurney, PT, DPT Acute Rehabilitation Services  Office: [phone]    Jewel Baize 07/07/2022, 11:12 AM

## 2022-07-07 NOTE — Progress Notes (Signed)
The risks and benefits of surgery for fasciotomy closure of the left leg were discussed with patient's father, including the possibility of infection, wound breakdown, DVT/ PE, loss of motion, and need for further surgery among others.   He acknowledged these risks and wished to proceed.  Myrene Galas, MD [facility], [facility] [phone]

## 2022-07-07 NOTE — Progress Notes (Signed)
Pt went to OR in bed with surgery escort. Consent signed and in Chart. Report given to presurgical.

## 2022-07-08 ENCOUNTER — Encounter (HOSPITAL_COMMUNITY): Payer: Self-pay | Admitting: Orthopedic Surgery

## 2022-07-08 ENCOUNTER — Inpatient Hospital Stay (HOSPITAL_COMMUNITY): Payer: BC Managed Care – PPO

## 2022-07-08 DIAGNOSIS — Z4889 Encounter for other specified surgical aftercare: Secondary | ICD-10-CM | POA: Diagnosis not present

## 2022-07-08 LAB — TYPE AND SCREEN
ABO/RH(D): O POS
Antibody Screen: NEGATIVE
Unit division: 0
Unit division: 0
Unit division: 0
Unit division: 0

## 2022-07-08 LAB — CBC WITH DIFFERENTIAL/PLATELET
Abs Immature Granulocytes: 0.03 10*3/uL (ref 0.00–0.07)
Basophils Absolute: 0 10*3/uL (ref 0.0–0.1)
Basophils Relative: 0 %
Eosinophils Absolute: 0.1 10*3/uL (ref 0.0–0.5)
Eosinophils Relative: 2 %
HCT: 23.4 % — ABNORMAL LOW (ref 39.0–52.0)
Hemoglobin: 7.5 g/dL — ABNORMAL LOW (ref 13.0–17.0)
Immature Granulocytes: 1 %
Lymphocytes Relative: 18 %
Lymphs Abs: 0.7 10*3/uL (ref 0.7–4.0)
MCH: 28.7 pg (ref 26.0–34.0)
MCHC: 32.1 g/dL (ref 30.0–36.0)
MCV: 89.7 fL (ref 80.0–100.0)
Monocytes Absolute: 0.4 10*3/uL (ref 0.1–1.0)
Monocytes Relative: 10 %
Neutro Abs: 2.4 10*3/uL (ref 1.7–7.7)
Neutrophils Relative %: 69 %
Platelets: 87 10*3/uL — ABNORMAL LOW (ref 150–400)
RBC: 2.61 MIL/uL — ABNORMAL LOW (ref 4.22–5.81)
RDW: 17.1 % — ABNORMAL HIGH (ref 11.5–15.5)
WBC: 3.6 10*3/uL — ABNORMAL LOW (ref 4.0–10.5)
nRBC: 0.6 % — ABNORMAL HIGH (ref 0.0–0.2)

## 2022-07-08 LAB — HEMOGLOBIN AND HEMATOCRIT, BLOOD
HCT: 26.3 % — ABNORMAL LOW (ref 39.0–52.0)
Hemoglobin: 8.6 g/dL — ABNORMAL LOW (ref 13.0–17.0)

## 2022-07-08 LAB — BPAM RBC
Blood Product Expiration Date: 202309272359
Blood Product Expiration Date: 202309272359
Blood Product Expiration Date: 202309272359
Blood Product Expiration Date: 202309272359
ISSUE DATE / TIME: 202308212238
ISSUE DATE / TIME: 202308220230
ISSUE DATE / TIME: 202308221140
ISSUE DATE / TIME: 202308221140
Unit Type and Rh: 5100
Unit Type and Rh: 5100
Unit Type and Rh: 5100
Unit Type and Rh: 5100

## 2022-07-08 LAB — COMPREHENSIVE METABOLIC PANEL
ALT: 36 U/L (ref 0–44)
AST: 47 U/L — ABNORMAL HIGH (ref 15–41)
Albumin: 2.3 g/dL — ABNORMAL LOW (ref 3.5–5.0)
Alkaline Phosphatase: 58 U/L (ref 38–126)
Anion gap: 8 (ref 5–15)
BUN: 6 mg/dL (ref 6–20)
CO2: 23 mmol/L (ref 22–32)
Calcium: 7.8 mg/dL — ABNORMAL LOW (ref 8.9–10.3)
Chloride: 109 mmol/L (ref 98–111)
Creatinine, Ser: 0.63 mg/dL (ref 0.61–1.24)
GFR, Estimated: >60 mL/min (ref >60)
Glucose, Bld: 110 mg/dL — ABNORMAL HIGH (ref 70–99)
Potassium: 3.1 mmol/L — ABNORMAL LOW (ref 3.5–5.1)
Sodium: 140 mmol/L (ref 135–145)
Total Bilirubin: 1.9 mg/dL — ABNORMAL HIGH (ref 0.3–1.2)
Total Protein: 5 g/dL — ABNORMAL LOW (ref 6.5–8.1)

## 2022-07-08 LAB — PHOSPHORUS: Phosphorus: 2.6 mg/dL (ref 2.5–4.6)

## 2022-07-08 LAB — MAGNESIUM: Magnesium: 2.2 mg/dL (ref 1.7–2.4)

## 2022-07-08 LAB — AMMONIA: Ammonia: 55 umol/L — ABNORMAL HIGH (ref 9–35)

## 2022-07-08 MED ORDER — BOOST / RESOURCE BREEZE PO LIQD CUSTOM
1.0000 | Freq: Three times a day (TID) | ORAL | Status: DC
  Administered 2022-07-08 (×2): 1 via ORAL

## 2022-07-08 MED ORDER — LORAZEPAM 2 MG/ML IJ SOLN
0.0000 mg | Freq: Four times a day (QID) | INTRAMUSCULAR | Status: AC
  Administered 2022-07-08: 2 mg via INTRAVENOUS
  Administered 2022-07-09: 3 mg via INTRAVENOUS
  Administered 2022-07-09 – 2022-07-10 (×5): 2 mg via INTRAVENOUS
  Filled 2022-07-08 (×2): qty 1
  Filled 2022-07-08: qty 2
  Filled 2022-07-08 (×4): qty 1

## 2022-07-08 MED ORDER — LORAZEPAM 2 MG/ML IJ SOLN
1.0000 mg | INTRAMUSCULAR | Status: AC | PRN
  Administered 2022-07-08 – 2022-07-09 (×3): 2 mg via INTRAVENOUS
  Administered 2022-07-09: 4 mg via INTRAVENOUS
  Administered 2022-07-10: 2 mg via INTRAVENOUS
  Filled 2022-07-08 (×2): qty 1
  Filled 2022-07-08: qty 2
  Filled 2022-07-08 (×2): qty 1

## 2022-07-08 MED ORDER — THIAMINE HCL 100 MG PO TABS: 100.0000 mg | ORAL_TABLET | Freq: Every day | ORAL | Status: DC

## 2022-07-08 MED ORDER — LORAZEPAM 1 MG PO TABS: 0.0000 mg | ORAL_TABLET | Freq: Two times a day (BID) | ORAL | Status: DC

## 2022-07-08 MED ORDER — PROCHLORPERAZINE EDISYLATE 10 MG/2ML IJ SOLN
5.0000 mg | Freq: Once | INTRAMUSCULAR | Status: AC
  Administered 2022-07-09: 5 mg via INTRAVENOUS
  Filled 2022-07-08: qty 2

## 2022-07-08 MED ORDER — KCL-LACTATED RINGERS-D5W 20 MEQ/L IV SOLN
INTRAVENOUS | Status: DC
  Filled 2022-07-08 (×7): qty 1000

## 2022-07-08 MED ORDER — FOLIC ACID 1 MG PO TABS: 1.0000 mg | ORAL_TABLET | Freq: Every day | ORAL | Status: DC

## 2022-07-08 MED ORDER — POTASSIUM CHLORIDE 10 MEQ/100ML IV SOLN
10.0000 meq | INTRAVENOUS | Status: AC
  Administered 2022-07-08 (×4): 10 meq via INTRAVENOUS
  Filled 2022-07-08 (×4): qty 100

## 2022-07-08 MED ORDER — LORAZEPAM 1 MG PO TABS
1.0000 mg | ORAL_TABLET | ORAL | Status: AC | PRN
  Filled 2022-07-08: qty 2

## 2022-07-08 MED ORDER — LORAZEPAM 2 MG/ML IJ SOLN
0.0000 mg | Freq: Two times a day (BID) | INTRAMUSCULAR | Status: AC
  Administered 2022-07-10 – 2022-07-11 (×3): 2 mg via INTRAVENOUS
  Filled 2022-07-08 (×3): qty 1

## 2022-07-08 MED ORDER — ADULT MULTIVITAMIN W/MINERALS CH: 1.0000 | ORAL_TABLET | Freq: Every day | ORAL | Status: DC

## 2022-07-08 MED ORDER — THIAMINE HCL 100 MG/ML IJ SOLN: 100.0000 mg | Freq: Every day | INTRAMUSCULAR | Status: DC

## 2022-07-08 MED ORDER — LORAZEPAM 1 MG PO TABS: 0.0000 mg | ORAL_TABLET | Freq: Four times a day (QID) | ORAL | Status: DC

## 2022-07-08 NOTE — Progress Notes (Signed)
SLP Cancellation Note  Patient Details Name: Ryan Lynch MRN: [id number] DOB: 12-15-1980   Cancelled treatment:    Spoke with Dr. Lowell Guitar, who gave verbal orders to cancel swallow assessment.  ,Saharsh Sterling L. Samson Frederic, MA CCC/SLP Clinical Specialist - Acute Care SLP Acute Rehabilitation Services Office number [phone]        Blenda Mounts Laurice 07/08/2022, 1:22 PM

## 2022-07-08 NOTE — Progress Notes (Signed)
Physical Therapy Treatment Patient Details Name: Ryan Lynch MRN: [id number] DOB: 1981/01/08 Today's Date: 07/08/2022   History of Present Illness 41 yo male admitted 07/02/22 following fall down the stairs with subsequent tibial plateua fx requiring 4 compartment fasciotomies and placement of ex fix. S/p ROIF tibial plateu fx 8/22. PMH includes anxiety, ACL repair, psoriasis and daily consumption of alcohol.    PT Comments    Pt was limited to bed level exercises today due to nausea, continual regurgitation, and pain. He is motivated to improve and participate though, pushing through the discomfort to learn L lower extremity ROM exercises. Session focused on educating and performing ROM exercises and fitting hinged knee brace to pt. Will continue to follow acutely. Current recommendations remain appropriate.     Recommendations for follow up therapy are one component of a multi-disciplinary discharge planning process, led by the attending physician.  Recommendations may be updated based on patient status, additional functional criteria and insurance authorization.  Follow Up Recommendations  Acute inpatient rehab (3hours/day)     Assistance Recommended at Discharge Frequent or constant Supervision/Assistance  Patient can return home with the following A little help with walking and/or transfers;A little help with bathing/dressing/bathroom;Assistance with cooking/housework;Assist for transportation;Help with stairs or ramp for entrance;Direct supervision/assist for medications management;Direct supervision/assist for financial management   Equipment Recommendations  Rolling walker (2 wheels);BSC/3in1;Wheelchair (measurements PT);Wheelchair cushion (measurements PT) (with elevating leg rests)    Recommendations for Other Services       Precautions / Restrictions Precautions Precautions: Fall;Other (comment) Precaution Comments: watch HR/O2 Required Braces or Orthoses: Other Brace Knee  Immobilizer - Left: On at all times (except with ROM with therapies) Other Brace: hinged knee brace, unlocked Restrictions Weight Bearing Restrictions: Yes LLE Weight Bearing: Non weight bearing Other Position/Activity Restrictions: no ROM restrictions     Mobility  Bed Mobility Overal bed mobility: Needs Assistance Bed Mobility: Rolling Rolling: Mod assist         General bed mobility comments: TAx2 to transition superiorly in bed with bed in trendelenburg position. Pt declined EOB or OOB mobility due to pain and noted continual regurgitation (on the edge of emesis). ModA to roll.    Transfers                   General transfer comment: Pt declined EOB or OOB mobility due to pain and noted continual regurgitation (on the edge of emesis).    Ambulation/Gait               General Gait Details: Pt declined EOB or OOB mobility due to pain and noted continual regurgitation (on the edge of emesis).   Stairs             Wheelchair Mobility    Modified Rankin (Stroke Patients Only)       Balance                                            Cognition Arousal/Alertness: Awake/alert Behavior During Therapy: Flat affect Overall Cognitive Status: Impaired/Different from baseline Area of Impairment: Safety/judgement, Following commands, Attention, Awareness, Memory, Problem solving                   Current Attention Level: Sustained Memory: Decreased short-term memory, Decreased recall of precautions Following Commands: Follows one step commands with increased time, Follows one step commands consistently Safety/Judgement: Decreased  awareness of deficits, Decreased awareness of safety Awareness: Intellectual Problem Solving: Slow processing, Difficulty sequencing, Requires verbal cues, Decreased initiation, Requires tactile cues General Comments: Pt often keeping eyes closed but awake. Pt with continual regurgitation and pain,  thereby limiting him from mobilizing OOB today. Follows simple cues with increased processing time. Pt repeating questions about purposes of brace and zero knee bone foam.        Exercises General Exercises - Lower Extremity Ankle Circles/Pumps: AROM, Left, 10 reps, Supine Quad Sets: AROM, Left, 10 reps, Supine (x3 seconds) Heel Slides: AAROM, Left, 10 reps, Supine Hip ABduction/ADduction: AAROM, Left, 10 reps, Supine Straight Leg Raises: AROM, Left, 10 reps, Supine Other Exercises Other Exercises: Heel cord stretch 3x ~30 sec on L supine performed by pt with sheet Other Exercises: adjusted and fit hinged knee brace to pt    General Comments        Pertinent Vitals/Pain Pain Assessment Pain Assessment: Faces Faces Pain Scale: Hurts even more Pain Location: L leg Pain Descriptors / Indicators: Operative site guarding, Discomfort, Grimacing, Guarding Pain Intervention(s): Monitored during session, Limited activity within patient's tolerance, Repositioned    Home Living                          Prior Function            PT Goals (current goals can now be found in the care plan section) Acute Rehab PT Goals Patient Stated Goal: to get better PT Goal Formulation: With patient Time For Goal Achievement: 07/17/22 Potential to Achieve Goals: Good Progress towards PT goals: Not progressing toward goals - comment (limited by pain and nausea)    Frequency    Min 5X/week      PT Plan Current plan remains appropriate    Co-evaluation              AM-PAC PT "6 Clicks" Mobility   Outcome Measure  Help needed turning from your back to your side while in a flat bed without using bedrails?: A Little Help needed moving from lying on your back to sitting on the side of a flat bed without using bedrails?: A Little Help needed moving to and from a bed to a chair (including a wheelchair)?: A Little Help needed standing up from a chair using your arms (e.g.,  wheelchair or bedside chair)?: A Little Help needed to walk in hospital room?: Total Help needed climbing 3-5 steps with a railing? : Total 6 Click Score: 14    End of Session   Activity Tolerance: Patient limited by pain;Other (comment) (continual regurgitation) Patient left: in bed;with bed alarm set;with call bell/phone within reach Nurse Communication: Mobility status;Other (comment) (continual regurgitation) PT Visit Diagnosis: Unsteadiness on feet (R26.81);Difficulty in walking, not elsewhere classified (R26.2);Pain Pain - Right/Left: Left Pain - part of body: Leg     Time: 4193-7902 PT Time Calculation (min) (ACUTE ONLY): 32 min  Charges:  $Therapeutic Exercise: 23-37 mins                     Raymond Gurney, PT, DPT Acute Rehabilitation Services  Office: [phone]    Jewel Baize 07/08/2022, 4:30 PM

## 2022-07-08 NOTE — Progress Notes (Signed)
Pt seen & examined Full consult to follow Ct and xray reviewed  Pt's iv has been pulled out Oriented  Distended abdomen, nontender, a little tight  NPO Bowel rest Keep K>4 Mag>2 Ideally NG tube but nurse reports pt has pulled 3 even with mittens and restraints.  Ideally would NG to LIWS and we could try gastrogaffin protocol.   Mary Sella. Andrey Campanile, MD, FACS General, Bariatric, & Minimally Invasive Surgery [facility], [place]

## 2022-07-08 NOTE — Progress Notes (Signed)
Orthopedic Tech Progress Note Patient Details:  Ryan Lynch 09-Nov-1981 [id number]  Patient ID: Ryan Lynch, male   DOB: December 09, 1980, 41 y.o.   MRN: [id number] Order placed with [facility] for ROM knee brace. Darleen Crocker 07/08/2022, 10:56 AM

## 2022-07-08 NOTE — Progress Notes (Signed)
Orthopedic Tech Progress Note Patient Details:  Ryan Lynch 1981/02/05 [id number]  Ortho Devices Type of Ortho Device: Bone foam zero knee Ortho Device/Splint Location: LLE Ortho Device/Splint Interventions: Ordered   Post Interventions Patient Tolerated: Well Instructions Provided: Care of device Bone foam dropped off in patients room and instructions provided. Patient refused to use it right now and said he would try later because he is having a significant amount of pain. Darleen Crocker 07/08/2022, 12:41 PM

## 2022-07-08 NOTE — Progress Notes (Signed)
PROGRESS NOTE    Ryan Lynch  WUJ:[id number] DOB: 06/25/1981 DOA: 07/02/2022 PCP: Patient, No Pcp Per  Chief Complaint  Patient presents with   Fall    Brief Narrative:  Ryan Lynch is Ryan Lynch 41 y.o. male with Ryan Lynch history of alcohol use and anxiety. Patient presented secondary to Ryan Lynch fall down his stairs and subsequent worsening pain. Upon arrival to the ED, he was found to have evidence of Ryan Lynch Lynch tibial fracture and evidence of compartment syndrome, requiring emergent fasciotomy. Patient has developed moderate alcohol withdrawal symptoms.    Assessment & Plan:   Principal Problem:   Observation after surgery Active Problems:   Tibial plateau fracture, Lynch   Alcohol withdrawal ([name])   Alcohol use   Compartment syndrome of Lynch lower extremity, initial encounter ([name])   Ileus ([name])   AKI (acute kidney injury) ([name])   Transaminitis   Traumatic rhabdomyolysis ([name])   Acute metabolic encephalopathy   Acute anemia   Adrenal nodule ([name])   Thrombocytopenia ([name])   Compression fracture of T7 vertebra ([name])   Anxiety   Psoriasis   Assessment and Plan: Tibial plateau fracture, Lynch Patient admitted to trauma service. S/p emergent fasciotomy on 8/19 for compartment syndrome. Management per primary, s/p surgery for 8/24, note pending  Alcohol withdrawal ([name]) Librium discontinued  Currently on ativan per ciwa, renewed - 11 and 12 scores overnight, recently scoring lower during day today, hopefully this continues  High dose thiamine  multivitamin and folic acid  Will have low threshold to discuss with PCCM regarding precedex   Alcohol use Treating withdrawal as above  Daily consumption. Alcohol level undetectable on admission. Patient hoping to detox. Started on CIWA on admission. Social work consulted.  AKI (acute kidney injury) ([name]) IV fluids UA 20 mg/dl ketones, small Hbg, 95-62 RBC's, rare bacteria -> follow  Improving, additional w/u as needed   Ileus ([name]) Likely  multifactorial in setting of surgery and narcotics. No abdominal pain or emesis.  CT abd/pelvis with evidence of ileus 8/24, increase in amount of small bowel gas suggesting ileus or partial obstruction  Plain films this AM with mild to moderate small bowel dilatation Pulled NG tube multiple times, trial clears today given he'd had BM's noted overnight -> will transition back to NPO given recurrent N/V Will ask surgery to see  Compartment syndrome of Lynch lower extremity, initial encounter ([name]) See problem, Tibial plateau fracture, Lynch.  Acute anemia S/p 3 units pRBC trend  Acute metabolic encephalopathy Patient with an ammonia of 96 on admission but without significant confusion at that time.  He seems more confused now. Likely multifactorial with alcohol withdrawal. Ammonia recently fluctuating from normal (8/24) to mildly elevated.  I don't think this pattern of elevation c/w with hepatic encephalopathy, will continue to hold lactulose, though if no improvement, will need to trial.  CIWA as above High dose thiamine Normal B12, folate Will work up additionally as needed  Traumatic rhabdomyolysis ([name]) Secondary to fall down the stairs. Resultant compartment syndrome. CK of 1,513, down to 975 but has increased.  CK improved  Continue IVF   Transaminitis AST/ALT elevated to 188/59 on admission respectively Secondary to rhabdomyolysis from compartment syndrome and possibly alcohol use.  improving  Thrombocytopenia ([name]) Likely related to trauma and blood loss. Patient with evidence of hepatic steatosis and mild splenomegaly on imaging. No prior labs available. Platelets trended down but appear to have stabilized. -Trend CBC -Watch for bleeding  Adrenal nodule ([name]) Bilateral with Rise Ryan Lynch 16 mm  indeterminate Lynch adrenal hypodense mass and Ryan Lynch right 10 mm indeterminate right adrenal gland mass. Recommendations for outpatient non-emergent CT or MRI of the adrenal  glands.  Compression fracture of T7 vertebra ([name]) -Pain management per primary  Anxiety -Continue Zoloft -Hold Klonopin while on CIWA/Librium taper  Psoriasis Noted on exam and now notice in history. Not on medication therapy per medication history.      DVT prophylaxis: SCD, per primary Code Status: full Family Communication: none - father over phone Disposition:   Status is: Inpatient Remains inpatient appropriate because: additional treatment for encephalopathy, ortho   Consultants:  orthopedics  Procedures:  8/19 1. EMERGENT FOUR COMPARTMENT FASCIOTOMIES Lynch LEG  Antimicrobials:  Anti-infectives (From admission, onward)    Start     Dose/Rate Route Frequency Ordered Stop   07/07/22 1000  ceFAZolin (ANCEF) IVPB 2g/100 mL premix        2 g 200 mL/hr over 30 Minutes Intravenous On call to O.R. 07/06/22 1001 07/07/22 1136   07/05/22 1800  ceFAZolin (ANCEF) IVPB 2g/100 mL premix        2 g 200 mL/hr over 30 Minutes Intravenous Every 8 hours 07/05/22 1540 07/06/22 1445   07/05/22 0600  ceFAZolin (ANCEF) IVPB 2g/100 mL premix        2 g 200 mL/hr over 30 Minutes Intravenous On call to O.R. 07/04/22 1225 07/05/22 0920   07/02/22 1830  ceFAZolin (ANCEF) IVPB 1 g/50 mL premix        1 g 100 mL/hr over 30 Minutes Intravenous Every 6 hours 07/02/22 1731 07/03/22 2006   07/02/22 1430  ceFAZolin (ANCEF) IVPB 1 g/50 mL premix        1 g 100 mL/hr over 30 Minutes Intravenous  Once 07/02/22 1419 07/03/22 2006       Subjective: Continued lethargy No new complaints  Objective: Vitals:   07/08/22 0300 07/08/22 0700 07/08/22 1136 07/08/22 1457  BP: 130/71 134/63 (!) 178/82 (!) 157/78  Pulse: 97 97 (!) 106 (!) 108  Resp: 17 (!) 21 20 20   Temp: 98.3 F (36.8 C) 98.1 F (36.7 C) (!) 97.5 F (36.4 C) 98 F (36.7 C)  TempSrc: Axillary Oral Oral Oral  SpO2: 98% 95% 91% 94%  Weight:      Height:        Intake/Output Summary (Last 24 hours) at 07/08/2022 1722 Last  data filed at 07/08/2022 1600 Gross per 24 hour  Intake 3205.81 ml  Output 2301 ml  Net 904.81 ml   Filed Weights   07/02/22 1300 07/03/22 2222  Weight: 86.2 kg 91.6 kg    Examination:  General: No acute distress. Cardiovascular: RRR Lungs: unlabored Abdomen: distended Neurological: alert, but lethargic, moving all extremities  Extremities: LLE with immobilizer in place  Data Reviewed: I have personally reviewed following labs and imaging studies  CBC: Recent Labs  Lab 07/04/22 0231 07/05/22 0907 07/05/22 1133 07/05/22 1558 07/06/22 0248 07/07/22 0159 07/08/22 0243  WBC 5.9  --   --  6.4 5.1 10.5 3.6*  NEUTROABS 4.8  --   --   --   --  8.0* 2.4  HGB 7.1*   < > 7.8* 9.2* 8.5* 8.7* 7.5*  HCT 21.9*   < > 23.0* 27.0* 25.2* 26.0* 23.4*  MCV 91.3  --   --  85.4 86.9 101.2* 89.7  PLT 82*  --   --  113* 101* 132* 87*   < > = values in this interval not displayed.  Basic Metabolic Panel: Recent Labs  Lab 07/04/22 0231 07/05/22 0907 07/05/22 1133 07/05/22 1558 07/06/22 0248 07/07/22 0159 07/08/22 0243  NA 134* 136 135 137 138 136 140  K 3.9 3.6 3.6 3.4* 3.2* 4.4 3.1*  CL 105 100  --  105 107 103 109  CO2 22  --   --  23 24 25 23   GLUCOSE 129* 127*  --  122* 116* 91 110*  BUN 12 12  --  11 9 16 6   CREATININE 0.83 0.50*  --  0.75 0.75 1.97* 0.63  CALCIUM 8.1*  --   --  7.8* 7.8* 8.5* 7.8*  MG  --   --   --   --   --  2.0 2.2  PHOS  --   --   --  2.6  --  3.7 2.6    GFR: Estimated Creatinine Clearance: 139.2 mL/min (by C-G formula based on SCr of 0.63 mg/dL).  Liver Function Tests: Recent Labs  Lab 07/04/22 0231 07/05/22 1558 07/06/22 0248 07/07/22 0159 07/08/22 0243  AST 171* 113* 93* 25 47*  ALT 47* 53* 49* 18 36  ALKPHOS 66 56 58 64 58  BILITOT 2.0* 1.6* 1.7* 0.8 1.9*  PROT 5.3* 5.1* 4.9* 6.6 5.0*  ALBUMIN 2.8* 2.7*  2.7* 2.6* 2.1* 2.3*    CBG: Recent Labs  Lab 07/04/22 2059  GLUCAP 128*     Recent Results (from the past 240 hour(s))   Resp Panel by RT-PCR (Flu Laiden Milles&B, Covid) Anterior Nasal Swab     Status: None   Collection Time: 07/02/22 10:44 AM   Specimen: Anterior Nasal Swab  Result Value Ref Range Status   SARS Coronavirus 2 by RT PCR NEGATIVE NEGATIVE Final    Comment: (NOTE) SARS-CoV-2 target nucleic acids are NOT DETECTED.  The SARS-CoV-2 RNA is generally detectable in upper respiratory specimens during the acute phase of infection. The lowest concentration of SARS-CoV-2 viral copies this assay can detect is 138 copies/mL. Mickle Campton negative result does not preclude SARS-Cov-2 infection and should not be used as the sole basis for treatment or other patient management decisions. Margit Batte negative result may occur with  improper specimen collection/handling, submission of specimen other than nasopharyngeal swab, presence of viral mutation(s) within the areas targeted by this assay, and inadequate number of viral copies(<138 copies/mL). Danesha Kirchoff negative result must be combined with clinical observations, patient history, and epidemiological information. The expected result is Negative.  Fact Sheet for Patients:  BloggerCourse.com  Fact Sheet for Healthcare Providers:  SeriousBroker.it  This test is no t yet approved or cleared by the [place] FDA and  has been authorized for detection and/or diagnosis of SARS-CoV-2 by FDA under an Emergency Use Authorization (EUA). This EUA will remain  in effect (meaning this test can be used) for the duration of the COVID-19 declaration under Section 564(b)(1) of the Act, 21 U.S.C.section 360bbb-3(b)(1), unless the authorization is terminated  or revoked sooner.       Influenza [place] by PCR NEGATIVE NEGATIVE Final   Influenza B by PCR NEGATIVE NEGATIVE Final    Comment: (NOTE) The Xpert Xpress SARS-CoV-2/FLU/RSV plus assay is intended as an aid in the diagnosis of influenza from Nasopharyngeal swab specimens and should not be used as Vance Hochmuth  sole basis for treatment. Nasal washings and aspirates are unacceptable for Xpert Xpress SARS-CoV-2/FLU/RSV testing.  Fact Sheet for Patients: BloggerCourse.com  Fact Sheet for Healthcare Providers: SeriousBroker.it  This test is not yet approved or cleared by the [place]  [place] FDA and has been authorized for detection and/or diagnosis of SARS-CoV-2 by FDA under an Emergency Use Authorization (EUA). This EUA will remain in effect (meaning this test can be used) for the duration of the COVID-19 declaration under Section 564(b)(1) of the Act, 21 U.S.C. section 360bbb-3(b)(1), unless the authorization is terminated or revoked.  Performed at [facility], [address], [place], [place] [address]          Radiology Studies: DG Abd 1 View  Result Date: 07/08/2022 CLINICAL DATA:  Ileus EXAM: ABDOMEN - 1 VIEW COMPARISON:  07/07/2022 abdominal radiograph FINDINGS: Diffuse mild-to-moderate small bowel dilatation up to 4.4 cm diameter, similar to minimally improved. No evidence of pneumatosis or pneumoperitoneum. Minimal colorectal gas. IMPRESSION: Diffuse mild-to-moderate small bowel dilatation, similar to minimally improved, compatible with reported adynamic ileus. Electronically Signed   By: Delbert Phenix M.D.   On: 07/08/2022 07:59   DG Abd 1 View  Result Date: 07/07/2022 CLINICAL DATA:  Nasogastric tube placement. EXAM: ABDOMEN - 1 VIEW COMPARISON:  Same day. FINDINGS: Distal tip of nasogastric tube is seen in expected position of proximal stomach. Distal side hole is in expected position of distal esophagus. Stable small bowel dilatation is noted. IMPRESSION: Distal tip of nasogastric tube is seen in expected position of proximal stomach. Distal side hole is seen in expected position of distal esophagus. Electronically Signed   By: Lupita Raider M.D.   On: 07/07/2022 13:05   DG Abd 1 View  Result Date:  07/07/2022 CLINICAL DATA:  Abdominal distention EXAM: ABDOMEN - 1 VIEW COMPARISON:  Previous studies including the examination of 07/06/2022 FINDINGS: There is interval increase in small bowel dilation. Dilated small bowel loops measure up to 5.1 cm in diameter. Gas is present in colon. Stomach is not distended. There is interval removal of enteric tube. IMPRESSION: There is slight increase in amount of small bowel gas suggesting ileus or partial obstruction. Electronically Signed   By: Ernie Avena M.D.   On: 07/07/2022 08:08   DG CHEST PORT 1 VIEW  Result Date: 07/07/2022 CLINICAL DATA:  Difficulty breathing EXAM: PORTABLE CHEST 1 VIEW COMPARISON:  Previous studies including the examination of 07/04/2022 FINDINGS: There is interval removal of enteric tube. There are linear densities in the lower lung fields with no significant change. There are no signs of pulmonary edema. There is poor inspiration. No new infiltrates are seen. There is no pleural effusion or pneumothorax. IMPRESSION: Linear densities in the lower lung fields suggest subsegmental atelectasis. Electronically Signed   By: Ernie Avena M.D.   On: 07/07/2022 08:05   DG Abd Portable 1V  Result Date: 07/06/2022 CLINICAL DATA:  NG tube placement. EXAM: PORTABLE ABDOMEN - 1 VIEW COMPARISON:  Radiograph earlier today FINDINGS: Tip and side port of the enteric tube below the diaphragm in the stomach. Slight progression in gaseous distension of small bowel in the central abdomen. Calcification in the right upper quadrant related to the gallbladder is stable from prior CT. IMPRESSION: 1. Tip and side port of the enteric tube below the diaphragm in the stomach. 2. Slight progression in gaseous distension of small bowel in the central abdomen, progressive ileus versus obstruction. Electronically Signed   By: Narda Rutherford M.D.   On: 07/06/2022 17:55        Scheduled Meds:  vitamin C  1,000 mg Oral Daily   Chlorhexidine Gluconate  Cloth  6 each Topical Daily   cholecalciferol  2,000 Units Oral BID  docusate sodium  100 mg Oral BID   feeding supplement  1 Container Oral TID BM   folic acid  1 mg Oral Daily   LORazepam  0-4 mg Oral Q6H   Followed by   Melene Muller ON 07/10/2022] LORazepam  0-4 mg Oral Q12H   methocarbamol  1,000 mg Oral TID   multivitamin with minerals  1 tablet Oral Daily   polyethylene glycol  17 g Oral Daily   pregabalin  75 mg Oral TID   sertraline  200 mg Oral Daily   [START ON 07/14/2022] thiamine  100 mg Oral Daily   zinc sulfate  220 mg Oral Daily   Continuous Infusions:  dextrose 5% lactated ringers with KCl 20 mEq/L Stopped (07/08/22 1548)   thiamine (VITAMIN B1) injection 500 mg (07/08/22 1438)   Followed by   Melene Muller ON 07/09/2022] thiamine (VITAMIN B1) injection       LOS: 6 days    Time spent: over 30 min    Lacretia Nicks, MD [facility]   To contact the attending provider between 7A-7P or the covering provider during after hours 7P-7A, please log into the web site [email or web address] and access using universal [place] password for that web site. If you do not have the password, please call the hospital operator.  07/08/2022, 5:22 PM

## 2022-07-08 NOTE — Plan of Care (Signed)
Restraints completed and removed. No agtation noted.

## 2022-07-08 NOTE — Plan of Care (Signed)

## 2022-07-08 NOTE — Progress Notes (Signed)
Orthopaedic Trauma Service Progress Note  Patient ID: Ryan Lynch MRN: [id number] DOB/AGE: Apr 28, 1981 41 y.o.  Subjective:  Looks better this am  NGT is out  Still in soft restraints   Less confusion this am   Knows it is august 2023  Knows he is at [facility] hospital   Knows he is Ryan Lynch   U/O 0.8 mL/kg/hr for the last 24 hours   ROS As above  Objective:   VITALS:   Vitals:   07/07/22 1930 07/07/22 2300 07/08/22 0300 07/08/22 0700  BP: (!) 150/74 133/68 130/71 134/63  Pulse: (!) 115 (!) 108 97 97  Resp: 20 20 17  (!) 21  Temp: 98.1 F (36.7 C) 100.1 F (37.8 C) 98.3 F (36.8 C) 98.1 F (36.7 C)  TempSrc: Oral Axillary Axillary Oral  SpO2: 95% 96% 98% 95%  Weight:      Height:        Estimated body mass index is 28.17 kg/m as calculated from the following:   Height as of this encounter: 5\' 11"  (1.803 m).   Weight as of this encounter: 91.6 kg.   Intake/Output      08/24 0701 08/25 0700 08/25 0701 08/26 0700   I.V. (mL/kg) 2673.2 (29.2)    IV Piggyback 100    Total Intake(mL/kg) 2773.2 (30.3)    Urine (mL/kg/hr) 1725 (0.8)    Emesis/NG output 1200    Drains     Stool 1    Blood 20    Total Output 2946    Net -172.8         Stool Occurrence 2 x      LABS  Results for orders placed or performed during the hospital encounter of 07/02/22 (from the past 24 hour(s))  Urinalysis, Routine w reflex microscopic Urine, Catheterized     Status: Abnormal   Collection Time: 07/07/22  5:56 PM  Result Value Ref Range   Color, Urine AMBER (A) YELLOW   APPearance CLEAR CLEAR   Specific Gravity, Urine 1.016 1.005 - 1.030   pH 7.0 5.0 - 8.0   Glucose, UA NEGATIVE NEGATIVE mg/dL   Hgb urine dipstick SMALL (A) NEGATIVE   Bilirubin Urine NEGATIVE NEGATIVE   Ketones, ur 20 (A) NEGATIVE mg/dL   Protein, ur NEGATIVE NEGATIVE mg/dL   Nitrite NEGATIVE NEGATIVE   Leukocytes,Ua NEGATIVE NEGATIVE    RBC / HPF 11-20 0 - 5 RBC/hpf   WBC, UA 0-5 0 - 5 WBC/hpf   Bacteria, UA RARE (A) NONE SEEN   Squamous Epithelial / LPF 0-5 0 - 5   Mucus PRESENT   Ammonia     Status: Abnormal   Collection Time: 07/08/22  2:43 AM  Result Value Ref Range   Ammonia 55 (H) 9 - 35 umol/L  CBC with Differential/Platelet     Status: Abnormal   Collection Time: 07/08/22  2:43 AM  Result Value Ref Range   WBC 3.6 (L) 4.0 - 10.5 K/uL   RBC 2.61 (L) 4.22 - 5.81 MIL/uL   Hemoglobin 7.5 (L) 13.0 - 17.0 g/dL   HCT 07/10/22 (L) 07/10/22 - 95.1 %   MCV 89.7 80.0 - 100.0 fL   MCH 28.7 26.0 - 34.0 pg   MCHC 32.1 30.0 - 36.0 g/dL   RDW 88.4 (H) 16.6 - 06.3 %   Platelets  87 (L) 150 - 400 K/uL   nRBC 0.6 (H) 0.0 - 0.2 %   Neutrophils Relative % 69 %   Neutro Abs 2.4 1.7 - 7.7 K/uL   Lymphocytes Relative 18 %   Lymphs Abs 0.7 0.7 - 4.0 K/uL   Monocytes Relative 10 %   Monocytes Absolute 0.4 0.1 - 1.0 K/uL   Eosinophils Relative 2 %   Eosinophils Absolute 0.1 0.0 - 0.5 K/uL   Basophils Relative 0 %   Basophils Absolute 0.0 0.0 - 0.1 K/uL   Immature Granulocytes 1 %   Abs Immature Granulocytes 0.03 0.00 - 0.07 K/uL  Comprehensive metabolic panel     Status: Abnormal   Collection Time: 07/08/22  2:43 AM  Result Value Ref Range   Sodium 140 135 - 145 mmol/L   Potassium 3.1 (L) 3.5 - 5.1 mmol/L   Chloride 109 98 - 111 mmol/L   CO2 23 22 - 32 mmol/L   Glucose, Bld 110 (H) 70 - 99 mg/dL   BUN 6 6 - 20 mg/dL   Creatinine, Ser 0.63 0.61 - 1.24 mg/dL   Calcium 7.8 (L) 8.9 - 10.3 mg/dL   Total Protein 5.0 (L) 6.5 - 8.1 g/dL   Albumin 2.3 (L) 3.5 - 5.0 g/dL   AST 47 (H) 15 - 41 U/L   ALT 36 0 - 44 U/L   Alkaline Phosphatase 58 38 - 126 U/L   Total Bilirubin 1.9 (H) 0.3 - 1.2 mg/dL   GFR, Estimated >60 >60 mL/min   Anion gap 8 5 - 15  Magnesium     Status: None   Collection Time: 07/08/22  2:43 AM  Result Value Ref Range   Magnesium 2.2 1.7 - 2.4 mg/dL  Phosphorus     Status: None   Collection Time: 07/08/22  2:43  AM  Result Value Ref Range   Phosphorus 2.6 2.5 - 4.6 mg/dL     PHYSICAL EXAM:   Gen: in bed, NAD, sleeping, easily arousable, remains calm.  Soft mitts in place   Lungs: unlabored Abd:  Nontender, mildly distended Ext:       Left lower Extremity              dressing clean, dry and intact              knee immobilizer in place              Ext warm              Mild swelling to foot and ankle             + DP pulse             continues to have improved motor function with ankle extension and flexion              Great toe extension intact             DPN, SPN sensation nearly normal per his report             TN sensation appears symmetric to contralateral side now   Assessment/Plan: 1 Day Post-Op   Principal Problem:   Observation after surgery Active Problems:   Tibial plateau fracture, left   Alcohol use   Transaminitis   Compartment syndrome of left lower extremity, initial encounter ([name])   Adrenal nodule ([name])   Compression fracture of T7 vertebra ([name])   Thrombocytopenia ([name])   Anxiety   Traumatic rhabdomyolysis ([place])  Ileus ([name])   Acute hepatic encephalopathy ([name])   Alcohol withdrawal ([name])   Acute anemia   Psoriasis   AKI (acute kidney injury) ([name])   Anti-infectives (From admission, onward)    Start     Dose/Rate Route Frequency Ordered Stop   07/07/22 1000  ceFAZolin (ANCEF) IVPB 2g/100 mL premix        2 g 200 mL/hr over 30 Minutes Intravenous On call to O.R. 07/06/22 1001 07/07/22 1136   07/05/22 1800  ceFAZolin (ANCEF) IVPB 2g/100 mL premix        2 g 200 mL/hr over 30 Minutes Intravenous Every 8 hours 07/05/22 1540 07/06/22 1445   07/05/22 0600  ceFAZolin (ANCEF) IVPB 2g/100 mL premix        2 g 200 mL/hr over 30 Minutes Intravenous On call to O.R. 07/04/22 1225 07/05/22 0920   07/02/22 1830  ceFAZolin (ANCEF) IVPB 1 g/50 mL premix        1 g 100 mL/hr over 30 Minutes Intravenous Every 6 hours 07/02/22 1731 07/03/22 2006   07/02/22  1430  ceFAZolin (ANCEF) IVPB 1 g/50 mL premix        1 g 100 mL/hr over 30 Minutes Intravenous  Once 07/02/22 1419 07/03/22 2006     .  POD/HD#: 1  41 y/o male, alcoholic, s/p fall with closed L bicondylar tibial plateau fracture and acute compartment syndrome left leg    -fall   - L bicondylar tibial plateau fracture s/p external fixation and acute compartment syndrome s/p 4 compartment fasciotomies              NWB L leg             Ice and elevate              continue with knee immobilizer                          Will order hinged brace             Ortho surgeries completed  Float heel off bed    Pressure relief dressing    Unrestricted ROM R knee                Therapies    PT- please teach HEP for L knee ROM- AROM, PROM. Prone exercises as well. No ROM restrictions.  Quad sets, SLR, LAQ, SAQ, heel slides, stretching, prone flexion and extension  Ankle theraband program, heel cord stretching, toe towel curls, etc  No pillows under bend of knee when at rest, ok to place under heel to help work on extension. Can also use zero knee bone foam if available  Hinged knee brace on at all times, unlocked.  Ok to work on [employer] without brace with therapist supervision.  Brace back on after ROM session if removed   - EtOH abuse             CIWA             SW consulted                active withdrawals                           Continue care in 4NP   - T7 compression fracture              No pain  Do not think this is acute              No further intervention    - Rhabdomyolysis              CK steady              Continue with IVF   - transaminitis              Likely multifactorial---> etoh, rhabdo    Improved    - thrombocytopenia              Will continue to hold anticoagulation      - Pain management:             Multimodal    - ABL anemia/Hemodynamics             monitor              Cbc in am               - Medical issues               Per medicine               - DVT/PE prophylaxis:             Scd R leg             Hold lovenox due to thrombocytopenia  - ID:              Periop abx    - Metabolic Bone Disease:             Vitamin d deficiency                          Supplement     - Impediments to fracture healing:             EtOH abuse             Vitamin d deficiency    - Dispo:             Therapies             Continue with inpatient care                Jari Pigg, PA-C [phone] (C) 07/08/2022, 10:45 AM  [facility] [place] [phone] Jenetta [phone] (F)    After 5pm and on the weekends please log on to Amion, go to orthopaedics and the look under the [facility] Call for the provider(s) on call. You can also call our office at [phone] and then follow the prompts to be connected to the call team.   Patient ID: Ryan Lynch, male   DOB: 09/09/1981, 41 y.o.   MRN: [id number]

## 2022-07-08 NOTE — Progress Notes (Signed)
Nutrition Follow-up  DOCUMENTATION CODES:   Not applicable  INTERVENTION:   Boost Breeze po TID, each supplement provides 250 kcal and 9 grams of protein  If unable to tolerate diet advancement recommend TPN initiation  NUTRITION DIAGNOSIS:   Increased nutrient needs related to acute illness, post-op healing as evidenced by estimated needs.  Ongoing  GOAL:   Patient will meet greater than or equal to 90% of their needs  Progressing   MONITOR:   PO intake, Supplement acceptance, Labs, Weight trends, I & O's, Skin  REASON FOR ASSESSMENT:   Consult Assessment of nutrition requirement/status  ASSESSMENT:   Pt admitted after a fall leading to L bicondylar tibial plateau fracture s/p external fixation and acute compartment syndrome s/p 4 compartment fasciotomies. PMH significant for alcohol use  8/22: s/p ORIF L tibial plateau 8/24: s/p I&D extremity with secondary closure of wound  Spoke with pt who reports no abd pain.  Pt pulled NG tube out; per abd xray either the same or slightly better  Medications reviewed and include: Vitamin C, Vitamin D3, colace, folvite, lasix, MVI, miralax, thiamine, zinc sulfate  IV drips: D5 with 20 mEq KCl/L @ 125 ml/hr  Labs: potassium 3.1 (L-repleted)  UOP: 1725 ml x24 hours NG: 1200 ml <24 hrs    Diet Order:   Diet Order             Diet clear liquid Room service appropriate? Yes; Fluid consistency: Thin  Diet effective now                   EDUCATION NEEDS:   No education needs have been identified at this time  Skin:  Skin Assessment: Skin Integrity Issues: Skin Integrity Issues:: Incisions Incisions: L leg (closed)  Last BM:  8/21  Height:   Ht Readings from Last 1 Encounters:  07/03/22 5\' 11"  (1.803 m)    Weight:   Wt Readings from Last 1 Encounters:  07/03/22 91.6 kg   BMI:  Body mass index is 28.17 kg/m.  Estimated Nutritional Needs:   Kcal:  2400-2600  Protein:  120-135g  Fluid:   >/=2L  Ryan Lynch P., RD, LDN, CNSC See AMiON for contact information

## 2022-07-09 ENCOUNTER — Inpatient Hospital Stay (HOSPITAL_COMMUNITY): Payer: BC Managed Care – PPO

## 2022-07-09 DIAGNOSIS — R9431 Abnormal electrocardiogram [ECG] [EKG]: Secondary | ICD-10-CM

## 2022-07-09 DIAGNOSIS — A419 Sepsis, unspecified organism: Secondary | ICD-10-CM

## 2022-07-09 DIAGNOSIS — R509 Fever, unspecified: Secondary | ICD-10-CM

## 2022-07-09 DIAGNOSIS — Z4889 Encounter for other specified surgical aftercare: Secondary | ICD-10-CM | POA: Diagnosis not present

## 2022-07-09 DIAGNOSIS — R338 Other retention of urine: Secondary | ICD-10-CM

## 2022-07-09 LAB — COMPREHENSIVE METABOLIC PANEL
ALT: 39 U/L (ref 0–44)
AST: 44 U/L — ABNORMAL HIGH (ref 15–41)
Albumin: 2.4 g/dL — ABNORMAL LOW (ref 3.5–5.0)
Alkaline Phosphatase: 131 U/L — ABNORMAL HIGH (ref 38–126)
Anion gap: 7 (ref 5–15)
BUN: 5 mg/dL — ABNORMAL LOW (ref 6–20)
CO2: 22 mmol/L (ref 22–32)
Calcium: 8 mg/dL — ABNORMAL LOW (ref 8.9–10.3)
Chloride: 111 mmol/L (ref 98–111)
Creatinine, Ser: 0.67 mg/dL (ref 0.61–1.24)
GFR, Estimated: >60 mL/min (ref >60)
Glucose, Bld: 166 mg/dL — ABNORMAL HIGH (ref 70–99)
Potassium: 3.2 mmol/L — ABNORMAL LOW (ref 3.5–5.1)
Sodium: 140 mmol/L (ref 135–145)
Total Bilirubin: 1.9 mg/dL — ABNORMAL HIGH (ref 0.3–1.2)
Total Protein: 5.5 g/dL — ABNORMAL LOW (ref 6.5–8.1)

## 2022-07-09 LAB — CBC WITH DIFFERENTIAL/PLATELET
Abs Immature Granulocytes: 0.05 10*3/uL (ref 0.00–0.07)
Basophils Absolute: 0 10*3/uL (ref 0.0–0.1)
Basophils Relative: 0 %
Eosinophils Absolute: 0 10*3/uL (ref 0.0–0.5)
Eosinophils Relative: 0 %
HCT: 27.6 % — ABNORMAL LOW (ref 39.0–52.0)
Hemoglobin: 8.8 g/dL — ABNORMAL LOW (ref 13.0–17.0)
Immature Granulocytes: 1 %
Lymphocytes Relative: 9 %
Lymphs Abs: 0.6 10*3/uL — ABNORMAL LOW (ref 0.7–4.0)
MCH: 28.6 pg (ref 26.0–34.0)
MCHC: 31.9 g/dL (ref 30.0–36.0)
MCV: 89.6 fL (ref 80.0–100.0)
Monocytes Absolute: 0.6 10*3/uL (ref 0.1–1.0)
Monocytes Relative: 9 %
Neutro Abs: 5.4 10*3/uL (ref 1.7–7.7)
Neutrophils Relative %: 81 %
Platelets: 168 10*3/uL (ref 150–400)
RBC: 3.08 MIL/uL — ABNORMAL LOW (ref 4.22–5.81)
RDW: 17.1 % — ABNORMAL HIGH (ref 11.5–15.5)
WBC: 6.8 10*3/uL (ref 4.0–10.5)
nRBC: 0 % (ref 0.0–0.2)

## 2022-07-09 LAB — PROCALCITONIN: Procalcitonin: 0.23 ng/mL

## 2022-07-09 LAB — D-DIMER, QUANTITATIVE: D-Dimer, Quant: 3.68 ug/mL-FEU — ABNORMAL HIGH (ref 0.00–0.50)

## 2022-07-09 LAB — LACTIC ACID, PLASMA: Lactic Acid, Venous: 1.6 mmol/L (ref 0.5–1.9)

## 2022-07-09 LAB — PHOSPHORUS: Phosphorus: 2 mg/dL — ABNORMAL LOW (ref 2.5–4.6)

## 2022-07-09 LAB — AMMONIA: Ammonia: 67 umol/L — ABNORMAL HIGH (ref 9–35)

## 2022-07-09 LAB — MAGNESIUM: Magnesium: 2.1 mg/dL (ref 1.7–2.4)

## 2022-07-09 MED ORDER — PROMETHAZINE HCL 25 MG PO TABS: 25.0000 mg | ORAL_TABLET | Freq: Four times a day (QID) | ORAL | Status: DC | PRN

## 2022-07-09 MED ORDER — LACTULOSE ENEMA
300.0000 mL | Freq: Every day | ORAL | Status: DC
  Filled 2022-07-09 (×2): qty 300

## 2022-07-09 MED ORDER — POTASSIUM CHLORIDE 10 MEQ/100ML IV SOLN
10.0000 meq | INTRAVENOUS | Status: AC
  Administered 2022-07-09 (×3): 10 meq via INTRAVENOUS
  Filled 2022-07-09 (×3): qty 100

## 2022-07-09 MED ORDER — LACTATED RINGERS IV BOLUS
1000.0000 mL | Freq: Once | INTRAVENOUS | Status: AC
Start: 2022-07-09 — End: 2022-07-09
  Administered 2022-07-09: 1000 mL via INTRAVENOUS

## 2022-07-09 MED ORDER — PROMETHAZINE HCL 25 MG RE SUPP: 25.0000 mg | Freq: Four times a day (QID) | RECTAL | Status: DC | PRN

## 2022-07-09 MED ORDER — POTASSIUM PHOSPHATES 15 MMOLE/5ML IV SOLN
30.0000 mmol | Freq: Once | INTRAVENOUS | Status: DC
  Administered 2022-07-09: 30 mmol via INTRAVENOUS
  Filled 2022-07-09: qty 10

## 2022-07-09 MED ORDER — ENOXAPARIN SODIUM 40 MG/0.4ML IJ SOSY
40.0000 mg | PREFILLED_SYRINGE | Freq: Every day | INTRAMUSCULAR | Status: DC
  Administered 2022-07-09 – 2022-07-16 (×8): 40 mg via SUBCUTANEOUS
  Filled 2022-07-09 (×8): qty 0.4

## 2022-07-09 MED ORDER — SODIUM CHLORIDE 0.9 % IV SOLN: 6.2500 mg | Freq: Four times a day (QID) | INTRAVENOUS | Status: DC | PRN

## 2022-07-09 MED ORDER — POTASSIUM CHLORIDE 10 MEQ/100ML IV SOLN: 10.0000 meq | INTRAVENOUS | Status: DC

## 2022-07-09 MED ORDER — DEXTROSE 5 % IV SOLN
30.0000 mmol | Freq: Once | INTRAVENOUS | Status: AC
  Filled 2022-07-09: qty 10

## 2022-07-09 MED ORDER — SODIUM CHLORIDE 0.9 % IV SOLN
3.0000 g | Freq: Four times a day (QID) | INTRAVENOUS | Status: DC
  Administered 2022-07-09 – 2022-07-13 (×15): 3 g via INTRAVENOUS
  Filled 2022-07-09 (×17): qty 8

## 2022-07-09 NOTE — Progress Notes (Signed)
Subjective: Patient somewhat poor historian due to mental status. Reports pain as mild. Still having DT and on CIWA protocol. Continues to remove his NGT and IV despite being in mittens. Urinating. Currently NPO. Has had issues with N/V. Denies CP, SOB but is breathing rapidly while also hiccupping and burping.  Minimal mobilization OOB with PT/OT.   Objective:   VITALS:   Vitals:   07/08/22 1457 07/08/22 1919 07/08/22 2305 07/09/22 0320  BP: (!) 157/78 (!) 167/74 (!) 172/83 (!) 165/103  Pulse: (!) 108 (!) 111 (!) 114 (!) 125  Resp: 20 16 20 20   Temp: 98 F (36.7 C) 98.6 F (37 C) 99.7 F (37.6 C) 99.9 F (37.7 C)  TempSrc: Oral Oral Oral Axillary  SpO2: 94% 99% 98% 100%  Weight:      Height:          Latest Ref Rng & Units 07/09/2022    2:32 AM 07/08/2022    5:47 PM 07/08/2022    2:43 AM  CBC  WBC 4.0 - 10.5 K/uL 6.8   3.6   Hemoglobin 13.0 - 17.0 g/dL 8.8  8.6  7.5   Hematocrit 39.0 - 52.0 % 27.6  26.3  23.4   Platelets 150 - 400 K/uL 168   87       Latest Ref Rng & Units 07/09/2022    2:32 AM 07/08/2022    2:43 AM 07/07/2022    1:59 AM  BMP  Glucose 70 - 99 mg/dL 07/09/2022  672  91   BUN 6 - 20 mg/dL 5  6  16    Creatinine 0.61 - 1.24 mg/dL 094   7.09   Sodium 135 - 145 mmol/L 140  140  136   Potassium 3.5 - 5.1 mmol/L 3.2  3.1  4.4   Chloride 98 - 111 mmol/L 111  109  103   CO2 22 - 32 mmol/L 22  23  25    Calcium 8.9 - 10.3 mg/dL 8.0  7.8  8.5    Intake/Output      08/25 0701 08/26 0700 08/26 0701 08/27 0700   I.V. (mL/kg) 432.6 (4.7)    IV Piggyback 100    Total Intake(mL/kg) 532.6 (5.8)    Urine (mL/kg/hr) 900 (0.4)    Emesis/NG output 0    Stool 0    Blood     Total Output 900    Net -367.4         Stool Occurrence 1 x    Emesis Occurrence 4 x       Physical Exam: General: Sitting up in bed, mittens on, visible tremors, hiccupping, burping, slightly sweaty Resp: increased wob and rate Cardio: regular rhythm, tachycardic ABD extremely  distended and tight, hyperresonant to percussion Neurologically intact given the circumstances MSK Neurovascularly intact Sensation intact distally Intact pulses distally Dorsiflexion/Plantar flexion intact Incision: dressing C/D/I, no soak through Bledsoe brace on   Assessment: 2 Days Post-Op  S/P Procedure(s) (LRB): IRRIGATION AND DEBRIDEMENT EXTREMITY (Left) SECONDARY CLOSURE OF WOUND (Left) by Dr. 9/26 on 07/07/22  Principal Problem:   Observation after surgery Active Problems:   Tibial plateau fracture, left   Alcohol use   Transaminitis   Compartment syndrome of left lower extremity, initial encounter ([name])   Adrenal nodule ([name])   Compression fracture of T7 vertebra ([name])   Thrombocytopenia ([name])   Anxiety   Traumatic rhabdomyolysis ([name])   Ileus ([name])   Acute metabolic encephalopathy   Alcohol withdrawal ([name])  Acute anemia   Psoriasis   AKI (acute kidney injury) ([name])    Plan: Appears code sepsis has been initiated. Concern for possible infection Tachycardic and Tachypnic but no fever Not complaining of leg pain. IS complaining of abdominal discomfort. May want to order CT chest and ABD to r/o PE and perforation Hospitalist team taking over as attending Has been combative. May want to consider increasing some sedative elements to avoid repeat removals of lines and tubes Up with therapy as able Incentive Spirometry Elevate and Apply ice  Weightbearing: NWB LLE Insicional and dressing care: Dressings left intact until follow-up and Reinforce dressings as needed Orthopedic device(s):  bledsoe knee brace Showering: Keep dressing dry VTE prophylaxis:  has been on hold due to thrombocytopenia  , SCDs, ambulation Pain control: continue current regimen Follow - up plan:  with Dr. Apolinar Junes information for today:  Margarita Rana MD, Levester Fresh PA-C  Dispo:  TBD. PT/OT recommending CIR     Marzetta Board Office [phone] 07/09/2022, 8:03  AM

## 2022-07-09 NOTE — Progress Notes (Signed)
Another consult was placed to IV Therapy, as the most recent iv infiltrated;  pt has also pulled out multiple ivs;  both forearms bruised; pt is in restraints, and also suggest that pt have mittens put back on.  Able to place IV in the LUA using ultrasound, but access is becoming more and more limited.

## 2022-07-09 NOTE — Assessment & Plan Note (Signed)
Hold antiemetics, holding zoloft Avoid QT prolonging agents If needed, consider ativan for nausea Replace lytes, mag >2 and K.4 Repeat daily EKG's If not improving, will discuss with cards

## 2022-07-09 NOTE — Progress Notes (Signed)
Pharmacy Antibiotic Note  Ryan Lynch is a 41 y.o. male admitted on 07/02/2022 s/p falling down 8 steps of stairs.  Underwent emergent 4 compartment fasciotomies of the left leg on 8/19.  Hospitalization complicated by ileus, AKI and EtOH withdrawal.  Febrile this AM with tachycardia.  Pharmacy has been consulted for Unasyn dosing for sepsis.  AKI resolved - SCr 1.67, CrCL > 100 ml/min, Tmax 101.6, WBC WNL.  Plan: Unasyn 3gm IV Q6H Pharmacy will sign off with stable renal function.  Thank you for the consult!  KPhos IV - labs in AM and further supplementation per MD  Height: 5\' 11"  (180.3 cm) Weight: 91.6 kg (201 lb 15.1 oz) IBW/kg (Calculated) : 75.3  Temp (24hrs), Avg:99.5 F (37.5 C), Min:98 F (36.7 C), Max:101.6 F (38.7 C)  Recent Labs  Lab 07/03/22 0251 07/04/22 0231 07/05/22 1558 07/06/22 0248 07/07/22 0159 07/08/22 0243 07/09/22 0232  WBC 5.1   < > 6.4 5.1 10.5 3.6* 6.8  CREATININE 0.74   < > 0.75 0.75 1.97* 0.63 0.67  LATICACIDVEN 1.0  --   --   --   --   --   --    < > = values in this interval not displayed.    Estimated Creatinine Clearance: 139.2 mL/min (by C-G formula based on SCr of 0.67 mg/dL).    No Known Allergies   Unasyn 8/26 >>  8/26 BCx -   Ambrielle Kington D. 12-21-1984, PharmD, BCPS, BCCCP 07/09/2022, 12:02 PM

## 2022-07-09 NOTE — Consult Note (Addendum)
CC: unable to obtain  Reason for consult: ileus  Requesting provider: dr Lowell Guitar  HPI: Ryan Lynch is an 41 y.o. male who is here for evaluation and management after falling down stairs and having worsening pain.  He was found to have a tibial fracture on the left and evidence of compartment syndrome.  He underwent emergent fasciotomy.  During his hospitalization he has developed moderate alcohol withdrawal symptoms.  Is also had some metabolic encephalopathy.  Is undergone several procedures for his orthopedic injury.  He had developed an ileus several days ago.  He had abdominal distention with nausea and vomiting.  A nasogastric tube was placed but the patient has removed it on 3 separate occasions.  Because of his persistent ileus General surgery was asked to evaluate.  No prior abdominal surgery  I cannot really get much history from the patient.  The majority history is obtained from the medical record as well as from the primary team and bedside nurse  He has been having some intermittent stools as well.  He has had a stool this morning as well as a few over the past few days  He had a noncontrasted CT scan August 21 that showed diffuse small bowel and large bowel distention in a pattern consistent with ileus.  He had a small amount of ascites in the right lower quadrant and left pelvis without any evidence of contrast leak or free air.  History reviewed. No pertinent past medical history.  Past Surgical History:  Procedure Laterality Date   ANTERIOR CRUCIATE LIGAMENT REPAIR Right    APPLICATION OF WOUND VAC Left 07/02/2022   Procedure: APPLICATION OF WOUND VAC;  Surgeon: Myrene Galas, MD;  Location: [facility] OR;  Service: Orthopedics;  Laterality: Left;   APPLICATION OF WOUND VAC Left 07/05/2022   Procedure: APPLICATION OF WOUND VAC;  Surgeon: Myrene Galas, MD;  Location: [facility] OR;  Service: Orthopedics;  Laterality: Left;   CLOSED REDUCTION TIBIA Left 07/02/2022   Procedure: CLOSED  REDUCTION TIBIA;  Surgeon: Myrene Galas, MD;  Location: [facility] OR;  Service: Orthopedics;  Laterality: Left;   EXTERNAL FIXATION LEG Left 07/02/2022   Procedure: EXTERNAL FIXATION LEG;  Surgeon: Myrene Galas, MD;  Location: [facility] OR;  Service: Orthopedics;  Laterality: Left;   FASCIOTOMY Left 07/02/2022   Procedure: FASCIOTOMY;  Surgeon: Myrene Galas, MD;  Location: [facility] OR;  Service: Orthopedics;  Laterality: Left;   I & D EXTREMITY Left 07/07/2022   Procedure: IRRIGATION AND DEBRIDEMENT EXTREMITY;  Surgeon: Myrene Galas, MD;  Location: [facility] OR;  Service: Orthopedics;  Laterality: Left;   ORIF TIBIA PLATEAU Left 07/05/2022   Procedure: OPEN REDUCTION INTERNAL FIXATION (ORIF) TIBIAL PLATEAU;  Surgeon: Myrene Galas, MD;  Location: [facility] OR;  Service: Orthopedics;  Laterality: Left;   SECONDARY CLOSURE OF WOUND Left 07/05/2022   Procedure: SECONDARY CLOSURE OF WOUND;  Surgeon: Myrene Galas, MD;  Location: [facility] OR;  Service: Orthopedics;  Laterality: Left;   SECONDARY CLOSURE OF WOUND Left 07/07/2022   Procedure: SECONDARY CLOSURE OF WOUND;  Surgeon: Myrene Galas, MD;  Location: [facility] OR;  Service: Orthopedics;  Laterality: Left;   SHOULDER SURGERY Bilateral     History reviewed. No pertinent family history.  Social:  reports that he has never smoked. He does not have any smokeless tobacco history on file. He reports current alcohol use. He reports that he does not use drugs.  Allergies: No Known Allergies  Medications: I have reviewed the patient's current medications.   ROS - unable to obtain  due to mental status  PE Blood pressure (!) 151/89, pulse (!) 131, temperature (!) 101.6 F (38.7 C), temperature source Axillary, resp. rate (!) 26, height 5\' 11"  (1.803 m), weight 91.6 kg, SpO2 99 %. Constitutional: NAD; ; no deformities Eyes: Moist conjunctiva; no lid lag; anicteric; PERRL Neck: Trachea midline; no thyromegaly Lungs: Normal respiratory effort; no tactile fremitus CV: RRR; no palpable  thrills; no pitting edema GI: Abd distended, a little firm. Nontender; no palpable hepatosplenomegaly MSK:  no clubbing/cyanosis, LLE immobilizer Psychiatric: poor insight,  alert but lethargic Lymphatic: No palpable cervical or axillary lymphadenopathy Skin: Scattered psoriasis   Results for orders placed or performed during the hospital encounter of 07/02/22 (from the past 48 hour(s))  Urinalysis, Routine w reflex microscopic Urine, Catheterized     Status: Abnormal   Collection Time: 07/07/22  5:56 PM  Result Value Ref Range   Color, Urine AMBER (A) YELLOW    Comment: BIOCHEMICALS MAY BE AFFECTED BY COLOR   APPearance CLEAR CLEAR   Specific Gravity, Urine 1.016 1.005 - 1.030   pH 7.0 5.0 - 8.0   Glucose, UA NEGATIVE NEGATIVE mg/dL   Hgb urine dipstick SMALL (A) NEGATIVE   Bilirubin Urine NEGATIVE NEGATIVE   Ketones, ur 20 (A) NEGATIVE mg/dL   Protein, ur NEGATIVE NEGATIVE mg/dL   Nitrite NEGATIVE NEGATIVE   Leukocytes,Ua NEGATIVE NEGATIVE   RBC / HPF 11-20 0 - 5 RBC/hpf   WBC, UA 0-5 0 - 5 WBC/hpf   Bacteria, UA RARE (A) NONE SEEN   Squamous Epithelial / LPF 0-5 0 - 5   Mucus PRESENT     Comment: Performed at [facility] Lab, [address] [place], [place]  Ammonia     Status: Abnormal   Collection Time: 07/08/22  2:43 AM  Result Value Ref Range   Ammonia 55 (H) 9 - 35 umol/L    Comment: Performed at [facility] Lab, [address], [place], [place]  CBC with Differential/Platelet     Status: Abnormal   Collection Time: 07/08/22  2:43 AM  Result Value Ref Range   WBC 3.6 (L) 4.0 - 10.5 K/uL   RBC 2.61 (L) 4.22 - 5.81 MIL/uL   Hemoglobin 7.5 (L) 13.0 - 17.0 g/dL   HCT 07/10/22 (L) 16.0 - 73.7 %   MCV 89.7 80.0 - 100.0 fL    Comment: REPEATED TO VERIFY DELTA CHECK NOTED    MCH 28.7 26.0 - 34.0 pg   MCHC 32.1 30.0 - 36.0 g/dL   RDW 10.6 (H) 26.9 - 48.5 %   Platelets 87 (L) 150 - 400 K/uL    Comment: Immature Platelet Fraction may  be clinically indicated, consider ordering this additional test 46.2 REPEATED TO VERIFY    nRBC 0.6 (H) 0.0 - 0.2 %   Neutrophils Relative % 69 %   Neutro Abs 2.4 1.7 - 7.7 K/uL   Lymphocytes Relative 18 %   Lymphs Abs 0.7 0.7 - 4.0 K/uL   Monocytes Relative 10 %   Monocytes Absolute 0.4 0.1 - 1.0 K/uL   Eosinophils Relative 2 %   Eosinophils Absolute 0.1 0.0 - 0.5 K/uL   Basophils Relative 0 %   Basophils Absolute 0.0 0.0 - 0.1 K/uL   Immature Granulocytes 1 %   Abs Immature Granulocytes 0.03 0.00 - 0.07 K/uL    Comment: Performed at [facility] Lab, [address], [place], [place]  Comprehensive metabolic panel  Status: Abnormal   Collection Time: 07/08/22  2:43 AM  Result Value Ref Range   Sodium 140 135 - 145 mmol/L   Potassium 3.1 (L) 3.5 - 5.1 mmol/L    Comment: DELTA CHECK NOTED   Chloride 109 98 - 111 mmol/L   CO2 23 22 - 32 mmol/L   Glucose, Bld 110 (H) 70 - 99 mg/dL    Comment: Glucose reference range applies only to samples taken after fasting for at least 8 hours.   BUN 6 6 - 20 mg/dL   Creatinine, Ser 5.73 0.61 - 1.24 mg/dL    Comment: DELTA CHECK NOTED   Calcium 7.8 (L) 8.9 - 10.3 mg/dL   Total Protein 5.0 (L) 6.5 - 8.1 g/dL   Albumin 2.3 (L) 3.5 - 5.0 g/dL   AST 47 (H) 15 - 41 U/L   ALT 36 0 - 44 U/L   Alkaline Phosphatase 58 38 - 126 U/L   Total Bilirubin 1.9 (H) 0.3 - 1.2 mg/dL   GFR, Estimated >22 >02 mL/min    Comment: (NOTE) Calculated using the CKD-EPI Creatinine Equation (2021)    Anion gap 8 5 - 15    Comment: Performed at [facility], [address] [place], [place] [address]  Magnesium     Status: None   Collection Time: 07/08/22  2:43 AM  Result Value Ref Range   Magnesium 2.2 1.7 - 2.4 mg/dL    Comment: Performed at [facility], [address] [place], [place] [address]  Phosphorus     Status: None   Collection Time: 07/08/22  2:43 AM  Result Value Ref Range   Phosphorus 2.6 2.5 - 4.6 mg/dL     Comment: Performed at [facility] Lab, [address] [place], [place] [address]  Hemoglobin and hematocrit, blood     Status: Abnormal   Collection Time: 07/08/22  5:47 PM  Result Value Ref Range   Hemoglobin 8.6 (L) 13.0 - 17.0 g/dL   HCT 17.6 (L) 16.0 - 73.7 %    Comment: Performed at [facility] Lab, [address], [place], [place] [address]  CBC with Differential/Platelet     Status: Abnormal   Collection Time: 07/09/22  2:32 AM  Result Value Ref Range   WBC 6.8 4.0 - 10.5 K/uL   RBC 3.08 (L) 4.22 - 5.81 MIL/uL   Hemoglobin 8.8 (L) 13.0 - 17.0 g/dL   HCT 94.8 (L) 54.6 - 27.0 %   MCV 89.6 80.0 - 100.0 fL   MCH 28.6 26.0 - 34.0 pg   MCHC 31.9 30.0 - 36.0 g/dL   RDW 35.0 (H) 09.3 - 81.8 %   Platelets 168 150 - 400 K/uL   nRBC 0.0 0.0 - 0.2 %   Neutrophils Relative % 81 %   Neutro Abs 5.4 1.7 - 7.7 K/uL   Lymphocytes Relative 9 %   Lymphs Abs 0.6 (L) 0.7 - 4.0 K/uL   Monocytes Relative 9 %   Monocytes Absolute 0.6 0.1 - 1.0 K/uL   Eosinophils Relative 0 %   Eosinophils Absolute 0.0 0.0 - 0.5 K/uL   Basophils Relative 0 %   Basophils Absolute 0.0 0.0 - 0.1 K/uL   Immature Granulocytes 1 %   Abs Immature Granulocytes 0.05 0.00 - 0.07 K/uL    Comment: Performed at [facility] Lab, [address], [place], [place] [address]  Comprehensive metabolic panel     Status: Abnormal   Collection Time:  07/09/22  2:32 AM  Result Value Ref Range   Sodium 140 135 - 145 mmol/L   Potassium 3.2 (L) 3.5 - 5.1 mmol/L   Chloride 111 98 - 111 mmol/L   CO2 22 22 - 32 mmol/L   Glucose, Bld 166 (H) 70 - 99 mg/dL    Comment: Glucose reference range applies only to samples taken after fasting for at least 8 hours.   BUN 5 (L) 6 - 20 mg/dL   Creatinine, Ser 3.15 0.61 - 1.24 mg/dL   Calcium 8.0 (L) 8.9 - 10.3 mg/dL   Total Protein 5.5 (L) 6.5 - 8.1 g/dL   Albumin 2.4 (L) 3.5 - 5.0 g/dL   AST 44 (H) 15 - 41 U/L   ALT 39 0 - 44 U/L   Alkaline Phosphatase 131 (H) 38 - 126 U/L   Total  Bilirubin 1.9 (H) 0.3 - 1.2 mg/dL   GFR, Estimated >40 >08 mL/min    Comment: (NOTE) Calculated using the CKD-EPI Creatinine Equation (2021)    Anion gap 7 5 - 15    Comment: Performed at [facility], [address] [place], [place] [address]  Magnesium     Status: None   Collection Time: 07/09/22  2:32 AM  Result Value Ref Range   Magnesium 2.1 1.7 - 2.4 mg/dL    Comment: Performed at [facility], [address] [place], [place] [address]  Phosphorus     Status: Abnormal   Collection Time: 07/09/22  2:32 AM  Result Value Ref Range   Phosphorus 2.0 (L) 2.5 - 4.6 mg/dL    Comment: Performed at [facility], [address], [place], [place] [address]  Ammonia     Status: Abnormal   Collection Time: 07/09/22  2:32 AM  Result Value Ref Range   Ammonia 67 (H) 9 - 35 umol/L    Comment: Performed at [facility], [address] [place], [place] [address]    DG Abd 1 View  Result Date: 07/08/2022 CLINICAL DATA:  Ileus EXAM: ABDOMEN - 1 VIEW COMPARISON:  07/07/2022 abdominal radiograph FINDINGS: Diffuse mild-to-moderate small bowel dilatation up to 4.4 cm diameter, similar to minimally improved. No evidence of pneumatosis or pneumoperitoneum. Minimal colorectal gas. IMPRESSION: Diffuse mild-to-moderate small bowel dilatation, similar to minimally improved, compatible with reported adynamic ileus. Electronically Signed   By: Delbert Phenix M.D.   On: 07/08/2022 07:59   DG Abd 1 View  Result Date: 07/07/2022 CLINICAL DATA:  Nasogastric tube placement. EXAM: ABDOMEN - 1 VIEW COMPARISON:  Same day. FINDINGS: Distal tip of nasogastric tube is seen in expected position of proximal stomach. Distal side hole is in expected position of distal esophagus. Stable small bowel dilatation is noted. IMPRESSION: Distal tip of nasogastric tube is seen in expected position of proximal stomach. Distal side hole is seen in expected position of distal esophagus. Electronically Signed   By:  Lupita Raider M.D.   On: 07/07/2022 13:05    Imaging: Reviewed abd xrays and abd/pel ct  A/P: Damario Gillie is an 41 y.o. male  Status post fall with tibial plateau fracture and compartment syndrome Status post fasciotomy and Ex-Fix Ileus EtOH abuse Transaminitis-improving Thrombocytopenia Acute blood loss anemia-stable Acute kidney injury-resolved Elevated total bilirubin-stable at 1.9 Elevated alkaline phosphatase level Bilateral adrenal nodules-agree with outpatient follow-up Compression fracture of T7  His exam is most consistent with an ileus since he has  pan dilation of his small bowel and  since he has been having bowel movements.  Etiology likely multifactorial  I do not initially think that he has cholecystitis as a potential etiology.  He does have gallstones but on his CT there is no evidence of inflammation.  He does have a little bit of elevated T bilirubin and alkaline phosphatase level.  Would recommend repeating LFTs tomorrow.  If remain elevated could consider right upper quadrant ultrasound and/or HIDA.  He does not have a leukocytosis.  No fever.  Recommend nasogastric tube placement even though he has pulled 3 of them out, bowel rest.  May need to consider TPN in a few days if ileus persists  I reviewed multiple abdominal x-rays, CT abdomen pelvis, 4 days worth the labs, reviewed orthopedic notes, reviewed hospitalist note, personally discussed case with hospitalist.  Discussed case with patient's parents  Mary Sellaric M. Andrey CampanileWilson, MD, FACS General, Bariatric, & Minimally Invasive Surgery [facility] [place]

## 2022-07-09 NOTE — Care Plan (Signed)
Sent transport to bring pt. To CT, per RN staff asked if we could send for pt. Around 8pm due to lack of available RN to come with pt.

## 2022-07-09 NOTE — Progress Notes (Signed)
Elink following for sepsis protocol. 

## 2022-07-09 NOTE — Progress Notes (Signed)
VASCULAR LAB    Bilateral lower extremity venous duplex has been performed.  See CV proc for preliminary results.   Levante Simones, RVT 07/09/2022, 1:51 PM

## 2022-07-09 NOTE — Progress Notes (Signed)
2015: Pt has an episode of vomiting, very small amount and green in color, this RN gave Reglan d/t the Zofran not being available yet, this RN and NT repositioned patient in bed and changed his gown.   2300: Pt has vomited again, given Zofran, this RN and NT gave patient a full bath and changed his linens   0010: Paged the MD on call for more IV N/V medication, given order for 5mg  compazine   0702: This RN rounded on pt prior to shift change, pt IV had infiltrated, warm to touch, removed IV and applied heat pack

## 2022-07-09 NOTE — Assessment & Plan Note (Addendum)
Tachycardic, febrile Unclear source at this point, ddx remains broad Need to w/u infectious and non infectious causes CT chest/abd/pelvis with smal to moderate right and small left pleural effusions, tightly contracted vs procelain gallbladder, high grade distal SBO, stranding along side ascending and descending colon Per discussion with orthopedics, wound looks good unasyn started, will plan for 5 days for now Blood cultures Has indwelling foley for retention, if unrevealing workup, will need UA/culture

## 2022-07-09 NOTE — Progress Notes (Signed)
   07/09/22 1038  Assess: MEWS Score  Temp (!) 101.6 F (38.7 C)  BP (!) 151/89  MAP (mmHg) 106  Pulse Rate (!) 131  ECG Heart Rate (!) 131  Resp (!) 26  SpO2 99 %  O2 Device Nasal Cannula  O2 Flow Rate (L/min) 3 L/min  Assess: MEWS Score  MEWS Temp 2  MEWS Systolic 0  MEWS Pulse 3  MEWS RR 2  MEWS LOC 0  MEWS Score 7  MEWS Score Color Red  Assess: if the MEWS score is Yellow or Red  Were vital signs taken at a resting state? No  Focused Assessment Change from prior assessment (see assessment flowsheet)  Does the patient meet 2 or more of the SIRS criteria? Yes  Does the patient have a confirmed or suspected source of infection? No  MEWS guidelines implemented *See Row Information* Yes  Treat  MEWS Interventions Escalated (See documentation below)  Pain Scale 0-10  Pain Score 0  Breathing 1  Negative Vocalization 0  Facial Expression 0  Body Language 1  Consolability 1  PAINAD Score 3  Complains of Anxiety  Neuro symptoms relieved by Anti-anxiety medication;Rest;Relaxation techniques (Comment)  Patients response to intervention Relief  Take Vital Signs  Increase Vital Sign Frequency  Red: Q 1hr X 4 then Q 4hr X 4, if remains red, continue Q 4hrs  Escalate  MEWS: Escalate Red: discuss with charge nurse/RN and provider, consider discussing with RRT  Notify: Charge Nurse/RN  Name of Charge Nurse/RN Notified  (yes)  Date Charge Nurse/RN Notified 07/09/22  Time Charge Nurse/RN Notified 1039  Notify: Provider  Provider Name/Title  Lowell Guitar MD)  Date Provider Notified 07/09/22  Time Provider Notified 1040  Method of Notification Face-to-face  Notification Reason Other (Comment) (Contacted to ask for nause meds and notify of HR)  Provider response See new orders;At bedside  Date of Provider Response 07/09/22  Time of Provider Response 1050  Notify: Rapid Response  Name of Rapid Response RN Notified Rapid response on unit to assist MD  Date Rapid Response Notified  07/09/22  Time Rapid Response Notified 1120  Document  Patient Outcome Stabilized after interventions  Assess: SIRS CRITERIA  SIRS Temperature  1  SIRS Pulse 1  SIRS Respirations  1  SIRS WBC 1  SIRS Score Sum  4

## 2022-07-09 NOTE — Assessment & Plan Note (Addendum)
With indwelling foley Continue for now flomax when able to take PO Would plan for trial of void in [place] few days, would hopefully be able to start flomax first to maximize chance of success

## 2022-07-09 NOTE — Progress Notes (Addendum)
in effect (meaning this test can be used) for the duration of the COVID-19 declaration under Section 564(b)(1) of the Act, 21 U.S.C.section 360bbb-3(b)(1), unless the authorization is terminated  or revoked sooner.       Influenza A by PCR NEGATIVE NEGATIVE Final   Influenza B by PCR NEGATIVE NEGATIVE Final    Comment: (NOTE) The Xpert Xpress SARS-CoV-2/FLU/RSV plus assay is intended as an aid in the diagnosis of influenza from Nasopharyngeal swab specimens and should not be used as a sole basis for treatment. Nasal washings and aspirates are unacceptable for Xpert Xpress SARS-CoV-2/FLU/RSV testing.  Fact Sheet for Patients: EntrepreneurPulse.com.au  Fact Sheet for Healthcare Providers: IncredibleEmployment.be  This test is not yet approved or cleared by the [place] FDA and has been authorized for detection and/or diagnosis of SARS-CoV-2 by FDA under an Emergency Use Authorization (EUA). This EUA will remain in effect (meaning this test can be used) for the duration of the COVID-19 declaration under Section 564(b)(1) of the Act, 21 U.S.C. section 360bbb-3(b)(1), unless the authorization is terminated or revoked.  Performed at [facility], [place] [address] [place], [place] [address]          Radiology Studies: VAS [place] LOWER EXTREMITY VENOUS (DVT)  Result Date: 07/09/2022  Lower Venous DVT Study Patient Name:  Ryan Lynch  Date of Exam:   07/09/2022 Medical  Rec #: [id number]    Accession #:    [id number] Date of Birth: [birth year]    Patient Gender: M Patient Age:   56 years Exam Location:  [facility] Procedure:      VAS [place] LOWER EXTREMITY VENOUS (DVT) Referring Phys: Ainsley Spinner --------------------------------------------------------------------------------  Indications: Fever of unknown origin.  Risk Factors: Status post 4 compartment fasciotomy with closure 07/08/22. ORIF of left Tibial plateau and fasciotomy 07/05/22 Limitations: Bandages and open wound. Comparison Study: No prior study Performing Technologist: Sharion Dove RVS  Examination Guidelines: A complete evaluation includes B-mode imaging, spectral Doppler, color Doppler, and power Doppler as needed of all accessible portions of each vessel. Bilateral testing is considered an integral part of a complete examination. Limited examinations for reoccurring indications may be performed as noted. The reflux portion of the exam is performed with the patient in reverse Trendelenburg.  +---------+---------------+---------+-----------+----------+--------------+ RIGHT    CompressibilityPhasicitySpontaneityPropertiesThrombus Aging +---------+---------------+---------+-----------+----------+--------------+ CFV      Full           Yes      Yes                                 +---------+---------------+---------+-----------+----------+--------------+ SFJ      Full                                                        +---------+---------------+---------+-----------+----------+--------------+ FV Prox  Full                                                        +---------+---------------+---------+-----------+----------+--------------+ FV Mid   Full                                                        +---------+---------------+---------+-----------+----------+--------------+ **Note De-Identified vi Obfusction** in effect (mening this test cn be used) for the durtion of the COVID-19 declrtion under Section 564(b)(1) of the ct, 21 U.S.C.section 360bbb-3(b)(1), unless the uthoriztion is terminted  or revoked sooner.       Influenz  by PCR NEGTIVE NEGTIVE Finl   Influenz B by PCR NEGTIVE NEGTIVE Finl    Comment: (NOTE) The Xpert Xpress SRS-CoV-2/FLU/RSV plus ssy is intended s n id in the dignosis of influenz from [place]el swb specimens nd should not be used s  sole bsis for tretment. Nsl wshings nd spirtes re uncceptble for Xpert Xpress SRS-CoV-2/FLU/RSV testing.  Fct Sheet for Ptients: EntrepreneurPulse.com.u  Fct Sheet for [place] Providers: IncredibleEmployment.be  This test is not yet pproved or clered by the [place] FD nd hs been uthorized for detection nd/or dignosis of SRS-CoV-2 by FD under n Emergency Use uthoriztion (EU). This EU will remin in effect (mening this test cn be used) for the durtion of the COVID-19 declrtion under Section 564(b)(1) of the ct, 21 U.S.C. section 360bbb-3(b)(1), unless the uthoriztion is terminted or revoked.  Performed t [facility], [place] [address] [place], [place] [address]          Rdiology Studies: VS [place] LOWER EXTREMITY VENOUS (DVT)  Result Dte: 07/09/2022  Lower Venous DVT Study Ptient Nme:  Ryan Lynch  Dte of Exm:   07/09/2022 Medicl  Rec #: [id number]    ccession #:    [id number] Dte of Birth: [birth year]    Ptient Gender: M Ptient ge:   56 yers Exm Loction:  [facility] Procedure:      VS [place] LOWER EXTREMITY VENOUS (DVT) Referring Phys: insley Spinner --------------------------------------------------------------------------------  Indictions: Fever of unknown origin.  Risk Fctors: Sttus post 4 comprtment fsciotomy with closure 07/08/22. ORIF of left Tibil plteu nd fsciotomy 07/05/22 Limittions: Bndges nd open wound. Comprison Study: No prior study Performing Technologist: Shrion Dove RVS  Exmintion Guidelines:  complete evlution includes B-mode imging, spectrl Doppler, color Doppler, nd power Doppler s needed of ll ccessible portions of ech vessel. Bilterl testing is considered n integrl prt of  complete exmintion. Limited exmintions for reoccurring indictions my be performed s noted. The reflux portion of the exm is performed with the ptient in reverse Trendelenburg.  +---------+---------------+---------+-----------+----------+--------------+ RIGHT    CompressibilityPhsicitySpontneityPropertiesThrombus ging +---------+---------------+---------+-----------+----------+--------------+ CFV      Full           Yes      Yes                                 +---------+---------------+---------+-----------+----------+--------------+ SFJ      Full                                                        +---------+---------------+---------+-----------+----------+--------------+ FV Prox  Full                                                        +---------+---------------+---------+-----------+----------+--------------+ FV Mid   Full                                                        +---------+---------------+---------+-----------+----------+--------------+  in effect (meaning this test can be used) for the duration of the COVID-19 declaration under Section 564(b)(1) of the Act, 21 U.S.C.section 360bbb-3(b)(1), unless the authorization is terminated  or revoked sooner.       Influenza A by PCR NEGATIVE NEGATIVE Final   Influenza B by PCR NEGATIVE NEGATIVE Final    Comment: (NOTE) The Xpert Xpress SARS-CoV-2/FLU/RSV plus assay is intended as an aid in the diagnosis of influenza from Nasopharyngeal swab specimens and should not be used as a sole basis for treatment. Nasal washings and aspirates are unacceptable for Xpert Xpress SARS-CoV-2/FLU/RSV testing.  Fact Sheet for Patients: EntrepreneurPulse.com.au  Fact Sheet for Healthcare Providers: IncredibleEmployment.be  This test is not yet approved or cleared by the [place] FDA and has been authorized for detection and/or diagnosis of SARS-CoV-2 by FDA under an Emergency Use Authorization (EUA). This EUA will remain in effect (meaning this test can be used) for the duration of the COVID-19 declaration under Section 564(b)(1) of the Act, 21 U.S.C. section 360bbb-3(b)(1), unless the authorization is terminated or revoked.  Performed at [facility], [place] [address] [place], [place] [address]          Radiology Studies: VAS [place] LOWER EXTREMITY VENOUS (DVT)  Result Date: 07/09/2022  Lower Venous DVT Study Patient Name:  Ryan Lynch  Date of Exam:   07/09/2022 Medical  Rec #: [id number]    Accession #:    [id number] Date of Birth: [birth year]    Patient Gender: M Patient Age:   56 years Exam Location:  [facility] Procedure:      VAS [place] LOWER EXTREMITY VENOUS (DVT) Referring Phys: Ainsley Spinner --------------------------------------------------------------------------------  Indications: Fever of unknown origin.  Risk Factors: Status post 4 compartment fasciotomy with closure 07/08/22. ORIF of left Tibial plateau and fasciotomy 07/05/22 Limitations: Bandages and open wound. Comparison Study: No prior study Performing Technologist: Sharion Dove RVS  Examination Guidelines: A complete evaluation includes B-mode imaging, spectral Doppler, color Doppler, and power Doppler as needed of all accessible portions of each vessel. Bilateral testing is considered an integral part of a complete examination. Limited examinations for reoccurring indications may be performed as noted. The reflux portion of the exam is performed with the patient in reverse Trendelenburg.  +---------+---------------+---------+-----------+----------+--------------+ RIGHT    CompressibilityPhasicitySpontaneityPropertiesThrombus Aging +---------+---------------+---------+-----------+----------+--------------+ CFV      Full           Yes      Yes                                 +---------+---------------+---------+-----------+----------+--------------+ SFJ      Full                                                        +---------+---------------+---------+-----------+----------+--------------+ FV Prox  Full                                                        +---------+---------------+---------+-----------+----------+--------------+ FV Mid   Full                                                        +---------+---------------+---------+-----------+----------+--------------+ **Note De-Identified vi Obfusction** in effect (mening this test cn be used) for the durtion of the COVID-19 declrtion under Section 564(b)(1) of the ct, 21 U.S.C.section 360bbb-3(b)(1), unless the uthoriztion is terminted  or revoked sooner.       Influenz  by PCR NEGTIVE NEGTIVE Finl   Influenz B by PCR NEGTIVE NEGTIVE Finl    Comment: (NOTE) The Xpert Xpress SRS-CoV-2/FLU/RSV plus ssy is intended s n id in the dignosis of influenz from [place] swb specimens nd should not be used s  sole bsis for tretment. Nsl wshings nd spirtes re uncceptble for Xpert Xpress SRS-CoV-2/FLU/RSV testing.  Fct Sheet for Ptients: EntrepreneurPulse.com.u  Fct Sheet for [place] Providers: IncredibleEmployment.be  This test is not yet pproved or clered by the [place] FD nd hs been uthorized for detection nd/or dignosis of SRS-CoV-2 by FD under n Emergency Use uthoriztion (EU). This EU will remin in effect (mening this test cn be used) for the durtion of the COVID-19 declrtion under Section 564(b)(1) of the ct, 21 U.S.C. section 360bbb-3(b)(1), unless the uthoriztion is terminted or revoked.  Performed t [facility], [place] [address] [place], [place] [address]          Rdiology Studies: VS [place] LOWER EXTREMITY VENOUS (DVT)  Result Dte: 07/09/2022  Lower Venous DVT Study Ptient Nme:  Ryan Lynch  Dte of Exm:   07/09/2022 Medicl  Rec #: [id number]    ccession #:    [id number] Dte of Birth: [birth year]    Ptient Gender: M Ptient ge:   56 yers Exm Loction:  [facility] Procedure:      VS [place] LOWER EXTREMITY VENOUS (DVT) Referring Phys: insley Spinner --------------------------------------------------------------------------------  Indictions: Fever of unknown origin.  Risk Fctors: Sttus post 4 comprtment fsciotomy with closure 07/08/22. ORIF of left Tibil plteu nd fsciotomy 07/05/22 Limittions: Bndges nd open wound. Comprison Study: No prior study Performing Technologist: Shrion Dove RVS  Exmintion Guidelines:  complete evlution includes B-mode imging, spectrl Doppler, color Doppler, nd power Doppler s needed of ll ccessible portions of ech vessel. Bilterl testing is considered n integrl prt of  complete exmintion. Limited exmintions for reoccurring indictions my be performed s noted. The reflux portion of the exm is performed with the ptient in reverse Trendelenburg.  +---------+---------------+---------+-----------+----------+--------------+ RIGHT    CompressibilityPhsicitySpontneityPropertiesThrombus ging +---------+---------------+---------+-----------+----------+--------------+ CFV      Full           Yes      Yes                                 +---------+---------------+---------+-----------+----------+--------------+ SFJ      Full                                                        +---------+---------------+---------+-----------+----------+--------------+ FV Prox  Full                                                        +---------+---------------+---------+-----------+----------+--------------+ FV Mid   Full                                                        +---------+---------------+---------+-----------+----------+--------------+ **Note De-Identified vi Obfusction** in effect (mening this test cn be used) for the durtion of the COVID-19 declrtion under Section 564(b)(1) of the ct, 21 U.S.C.section 360bbb-3(b)(1), unless the uthoriztion is terminted  or revoked sooner.       Influenz  by PCR NEGTIVE NEGTIVE Finl   Influenz B by PCR NEGTIVE NEGTIVE Finl    Comment: (NOTE) The Xpert Xpress SRS-CoV-2/FLU/RSV plus ssy is intended s n id in the dignosis of influenz from [place] swb specimens nd should not be used s  sole bsis for tretment. Nsl wshings nd spirtes re uncceptble for Xpert Xpress SRS-CoV-2/FLU/RSV testing.  Fct Sheet for Ptients: EntrepreneurPulse.com.u  Fct Sheet for [employer] Providers: IncredibleEmployment.be  This test is not yet pproved or clered by the [place] FD nd hs been uthorized for detection nd/or dignosis of SRS-CoV-2 by FD under n Emergency Use uthoriztion (EU). This EU will remin in effect (mening this test cn be used) for the durtion of the COVID-19 declrtion under Section 564(b)(1) of the ct, 21 U.S.C. section 360bbb-3(b)(1), unless the uthoriztion is terminted or revoked.  Performed t [facility], [place] [address] [place], [place] [address]          Rdiology Studies: VS [place] LOWER EXTREMITY VENOUS (DVT)  Result Dte: 07/09/2022  Lower Venous DVT Study Ptient Nme:  Ryan Lynch  Dte of Exm:   07/09/2022 Medicl  Rec #: [id number]    ccession #:    [id number] Dte of Birth: [birth year]    Ptient Gender: M Ptient ge:   56 yers Exm Loction:  [facility] Procedure:      VS [place] LOWER EXTREMITY VENOUS (DVT) Referring Phys: insley Spinner --------------------------------------------------------------------------------  Indictions: Fever of unknown origin.  Risk Fctors: Sttus post 4 comprtment fsciotomy with closure 07/08/22. ORIF of left Tibil plteu nd fsciotomy 07/05/22 Limittions: Bndges nd open wound. Comprison Study: No prior study Performing Technologist: Shrion Dove RVS  Exmintion Guidelines:  complete evlution includes B-mode imging, spectrl Doppler, color Doppler, nd power Doppler s needed of ll ccessible portions of ech vessel. Bilterl testing is considered n integrl prt of  complete exmintion. Limited exmintions for reoccurring indictions my be performed s noted. The reflux portion of the exm is performed with the ptient in reverse Trendelenburg.  +---------+---------------+---------+-----------+----------+--------------+ RIGHT    CompressibilityPhsicitySpontneityPropertiesThrombus ging +---------+---------------+---------+-----------+----------+--------------+ CFV      Full           Yes      Yes                                 +---------+---------------+---------+-----------+----------+--------------+ SFJ      Full                                                        +---------+---------------+---------+-----------+----------+--------------+ FV Prox  Full                                                        +---------+---------------+---------+-----------+----------+--------------+ FV Mid   Full                                                        +---------+---------------+---------+-----------+----------+--------------+ **Note De-Identified vi Obfusction** in effect (mening this test cn be used) for the durtion of the COVID-19 declrtion under Section 564(b)(1) of the ct, 21 U.S.C.section 360bbb-3(b)(1), unless the uthoriztion is terminted  or revoked sooner.       Influenz  by PCR NEGTIVE NEGTIVE Finl   Influenz B by PCR NEGTIVE NEGTIVE Finl    Comment: (NOTE) The Xpert Xpress SRS-CoV-2/FLU/RSV plus ssy is intended s n id in the dignosis of influenz from [place] swb specimens nd should not be used s  sole bsis for tretment. Nsl wshings nd spirtes re uncceptble for Xpert Xpress SRS-CoV-2/FLU/RSV testing.  Fct Sheet for Ptients: EntrepreneurPulse.com.u  Fct Sheet for [employer] Providers: IncredibleEmployment.be  This test is not yet pproved or clered by the [place] FD nd hs been uthorized for detection nd/or dignosis of SRS-CoV-2 by FD under n Emergency Use uthoriztion (EU). This EU will remin in effect (mening this test cn be used) for the durtion of the COVID-19 declrtion under Section 564(b)(1) of the ct, 21 U.S.C. section 360bbb-3(b)(1), unless the uthoriztion is terminted or revoked.  Performed t [facility], [place] [address] [place], [place] [address]          Rdiology Studies: VS [place] LOWER EXTREMITY VENOUS (DVT)  Result Dte: 07/09/2022  Lower Venous DVT Study Ptient Nme:  Ryan Lynch  Dte of Exm:   07/09/2022 Medicl  Rec #: [id number]    ccession #:    [id number] Dte of Birth: [birth year]    Ptient Gender: M Ptient ge:   56 yers Exm Loction:  [facility] Procedure:      VS [place] LOWER EXTREMITY VENOUS (DVT) Referring Phys: insley Spinner --------------------------------------------------------------------------------  Indictions: Fever of unknown origin.  Risk Fctors: Sttus post 4 comprtment fsciotomy with closure 07/08/22. ORIF of left Tibil plteu nd fsciotomy 07/05/22 Limittions: Bndges nd open wound. Comprison Study: No prior study Performing Technologist: Shrion Dove RVS  Exmintion Guidelines:  complete evlution includes B-mode imging, spectrl Doppler, color Doppler, nd power Doppler s needed of ll ccessible portions of ech vessel. Bilterl testing is considered n integrl prt of  complete exmintion. Limited exmintions for reoccurring indictions my be performed s noted. The reflux portion of the exm is performed with the ptient in reverse Trendelenburg.  +---------+---------------+---------+-----------+----------+--------------+ RIGHT    CompressibilityPhsicitySpontneityPropertiesThrombus ging +---------+---------------+---------+-----------+----------+--------------+ CFV      Full           Yes      Yes                                 +---------+---------------+---------+-----------+----------+--------------+ SFJ      Full                                                        +---------+---------------+---------+-----------+----------+--------------+ FV Prox  Full                                                        +---------+---------------+---------+-----------+----------+--------------+ FV Mid   Full                                                        +---------+---------------+---------+-----------+----------+--------------+ **Note De-Identified vi Obfusction** in effect (mening this test cn be used) for the durtion of the COVID-19 declrtion under Section 564(b)(1) of the ct, 21 U.S.C.section 360bbb-3(b)(1), unless the uthoriztion is terminted  or revoked sooner.       Influenz  by PCR NEGTIVE NEGTIVE Finl   Influenz B by PCR NEGTIVE NEGTIVE Finl    Comment: (NOTE) The Xpert Xpress SRS-CoV-2/FLU/RSV plus ssy is intended s n id in the dignosis of influenz from [place] swb specimens nd should not be used s  sole bsis for tretment. Nsl wshings nd spirtes re uncceptble for Xpert Xpress SRS-CoV-2/FLU/RSV testing.  Fct Sheet for Ptients: EntrepreneurPulse.com.u  Fct Sheet for [employer] Providers: IncredibleEmployment.be  This test is not yet pproved or clered by the [place] FD nd hs been uthorized for detection nd/or dignosis of SRS-CoV-2 by FD under n Emergency Use uthoriztion (EU). This EU will remin in effect (mening this test cn be used) for the durtion of the COVID-19 declrtion under Section 564(b)(1) of the ct, 21 U.S.C. section 360bbb-3(b)(1), unless the uthoriztion is terminted or revoked.  Performed t [facility], [place] [address] [place], [place] [address]          Rdiology Studies: VS [place] LOWER EXTREMITY VENOUS (DVT)  Result Dte: 07/09/2022  Lower Venous DVT Study Ptient Nme:  Ryan Lynch  Dte of Exm:   07/09/2022 Medicl  Rec #: [id number]    ccession #:    [id number] Dte of Birth: [birth year]    Ptient Gender: M Ptient ge:   56 yers Exm Loction:  [facility] Procedure:      VS [place] LOWER EXTREMITY VENOUS (DVT) Referring Phys: insley Spinner --------------------------------------------------------------------------------  Indictions: Fever of unknown origin.  Risk Fctors: Sttus post 4 comprtment fsciotomy with closure 07/08/22. ORIF of left Tibil plteu nd fsciotomy 07/05/22 Limittions: Bndges nd open wound. Comprison Study: No prior study Performing Technologist: Shrion Dove RVS  Exmintion Guidelines:  complete evlution includes B-mode imging, spectrl Doppler, color Doppler, nd power Doppler s needed of ll ccessible portions of ech vessel. Bilterl testing is considered n integrl prt of  complete exmintion. Limited exmintions for reoccurring indictions my be performed s noted. The reflux portion of the exm is performed with the ptient in reverse Trendelenburg.  +---------+---------------+---------+-----------+----------+--------------+ RIGHT    CompressibilityPhsicitySpontneityPropertiesThrombus ging +---------+---------------+---------+-----------+----------+--------------+ CFV      Full           Yes      Yes                                 +---------+---------------+---------+-----------+----------+--------------+ SFJ      Full                                                        +---------+---------------+---------+-----------+----------+--------------+ FV Prox  Full                                                        +---------+---------------+---------+-----------+----------+--------------+ FV Mid   Full                                                        +---------+---------------+---------+-----------+----------+--------------+

## 2022-07-10 ENCOUNTER — Inpatient Hospital Stay (HOSPITAL_COMMUNITY): Payer: BC Managed Care – PPO

## 2022-07-10 ENCOUNTER — Inpatient Hospital Stay: Payer: Self-pay

## 2022-07-10 DIAGNOSIS — Z4889 Encounter for other specified surgical aftercare: Secondary | ICD-10-CM | POA: Diagnosis not present

## 2022-07-10 LAB — CBC WITH DIFFERENTIAL/PLATELET
Abs Immature Granulocytes: 0.1 10*3/uL — ABNORMAL HIGH (ref 0.00–0.07)
Basophils Absolute: 0 10*3/uL (ref 0.0–0.1)
Basophils Relative: 0 %
Blasts: 3 %
Eosinophils Absolute: 0.1 10*3/uL (ref 0.0–0.5)
Eosinophils Relative: 3 %
HCT: 23.8 % — ABNORMAL LOW (ref 39.0–52.0)
Hemoglobin: 7.4 g/dL — ABNORMAL LOW (ref 13.0–17.0)
Lymphocytes Relative: 17 %
Lymphs Abs: 0.6 10*3/uL — ABNORMAL LOW (ref 0.7–4.0)
MCH: 28.1 pg (ref 26.0–34.0)
MCHC: 31.1 g/dL (ref 30.0–36.0)
MCV: 90.5 fL (ref 80.0–100.0)
Monocytes Absolute: 0 10*3/uL — ABNORMAL LOW (ref 0.1–1.0)
Monocytes Relative: 0 %
Neutro Abs: 2.6 10*3/uL (ref 1.7–7.7)
Neutrophils Relative %: 74 %
Platelets: 126 10*3/uL — ABNORMAL LOW (ref 150–400)
Promyelocytes Relative: 3 %
RBC: 2.63 MIL/uL — ABNORMAL LOW (ref 4.22–5.81)
RDW: 17.3 % — ABNORMAL HIGH (ref 11.5–15.5)
WBC: 3.5 10*3/uL — ABNORMAL LOW (ref 4.0–10.5)
nRBC: 0 % (ref 0.0–0.2)
nRBC: 0 /100 WBC

## 2022-07-10 LAB — COMPREHENSIVE METABOLIC PANEL
ALT: 28 U/L (ref 0–44)
AST: 25 U/L (ref 15–41)
Albumin: 2 g/dL — ABNORMAL LOW (ref 3.5–5.0)
Alkaline Phosphatase: 114 U/L (ref 38–126)
Anion gap: 7 (ref 5–15)
BUN: 8 mg/dL (ref 6–20)
CO2: 23 mmol/L (ref 22–32)
Calcium: 7.4 mg/dL — ABNORMAL LOW (ref 8.9–10.3)
Chloride: 110 mmol/L (ref 98–111)
Creatinine, Ser: 0.86 mg/dL (ref 0.61–1.24)
GFR, Estimated: >60 mL/min (ref >60)
Glucose, Bld: 118 mg/dL — ABNORMAL HIGH (ref 70–99)
Potassium: 3.5 mmol/L (ref 3.5–5.1)
Sodium: 140 mmol/L (ref 135–145)
Total Bilirubin: 1.3 mg/dL — ABNORMAL HIGH (ref 0.3–1.2)
Total Protein: 4.6 g/dL — ABNORMAL LOW (ref 6.5–8.1)

## 2022-07-10 LAB — PHOSPHORUS: Phosphorus: 3.3 mg/dL (ref 2.5–4.6)

## 2022-07-10 LAB — PROCALCITONIN: Procalcitonin: 0.26 ng/mL

## 2022-07-10 LAB — MAGNESIUM: Magnesium: 2 mg/dL (ref 1.7–2.4)

## 2022-07-10 LAB — AMMONIA: Ammonia: 49 umol/L — ABNORMAL HIGH (ref 9–35)

## 2022-07-10 LAB — BRAIN NATRIURETIC PEPTIDE: B Natriuretic Peptide: 12.4 pg/mL (ref 0.0–100.0)

## 2022-07-10 MED ORDER — POTASSIUM CHLORIDE 10 MEQ/100ML IV SOLN
10.0000 meq | Freq: Once | INTRAVENOUS | Status: AC
  Administered 2022-07-10: 10 meq via INTRAVENOUS
  Filled 2022-07-10: qty 100

## 2022-07-10 MED ORDER — SODIUM CHLORIDE 0.9% FLUSH
10.0000 mL | Freq: Two times a day (BID) | INTRAVENOUS | Status: DC
  Administered 2022-07-11 – 2022-07-16 (×11): 10 mL

## 2022-07-10 MED ORDER — DIATRIZOATE MEGLUMINE & SODIUM 66-10 % PO SOLN
90.0000 mL | Freq: Once | ORAL | Status: AC
  Administered 2022-07-10: 90 mL via NASOGASTRIC
  Filled 2022-07-10: qty 90

## 2022-07-10 MED ORDER — POTASSIUM CHLORIDE 10 MEQ/100ML IV SOLN
10.0000 meq | INTRAVENOUS | Status: AC
  Administered 2022-07-10 (×2): 10 meq via INTRAVENOUS
  Filled 2022-07-10 (×2): qty 100

## 2022-07-10 MED ORDER — POTASSIUM CHLORIDE 10 MEQ/100ML IV SOLN: 10.0000 meq | INTRAVENOUS | Status: DC

## 2022-07-10 MED ORDER — IOHEXOL 350 MG/ML SOLN
100.0000 mL | Freq: Once | INTRAVENOUS | Status: AC | PRN
Start: 2022-07-10 — End: 2022-07-10
  Administered 2022-07-10: 100 mL via INTRAVENOUS

## 2022-07-10 MED ORDER — SODIUM CHLORIDE 0.9% FLUSH: 10.0000 mL | INTRAVENOUS | Status: DC | PRN

## 2022-07-10 NOTE — Progress Notes (Signed)
PROGRESS NOTE    Ryan Lynch  DTO:[id number] DOB: [birth year] DOA: 07/02/2022 PCP: Patient, No Pcp Per  Chief Complaint  Patient presents with   Fall    Brief Narrative:  Ryan Lynch is Ryan Lynch 40 y.o. male with Ryan Lynch history of alcohol use and anxiety. Patient presented secondary to Ryan Lynch fall down his stairs and subsequent worsening pain. Upon arrival to the ED, he was found to have evidence of Ryan Lynch left tibial fracture and evidence of compartment syndrome, requiring emergent fasciotomy. Patient has developed moderate alcohol withdrawal symptoms.    Assessment & Plan:   Principal Problem:   Observation after surgery Active Problems:   Tibial plateau fracture, left   Sepsis ([name])   Alcohol withdrawal ([name])   Alcohol use   Compartment syndrome of left lower extremity, initial encounter ([name])   Ileus ([name])   AKI (acute kidney injury) ([name])   Transaminitis   Traumatic rhabdomyolysis ([name])   Acute metabolic encephalopathy   Acute anemia   Prolonged QT interval   Adrenal nodule ([name])   Thrombocytopenia ([name])   Compression fracture of T7 vertebra ([name])   Acute urinary retention   Anxiety   Psoriasis   Assessment and Plan: Sepsis ([name]) Tachycardic, febrile Unclear source at this point, ddx remains broad Need to w/u infectious and non infectious causes CT chest/abd/pelvis with smal to moderate right and small left pleural effusions, tightly contracted vs procelain gallbladder, high grade distal SBO, stranding along side ascending and descending colon Per discussion with orthopedics, wound looks good unasyn started Blood cultures Has indwelling foley for retention, if unrevealing workup, will need UA/culture  Tibial plateau fracture, left Patient admitted to trauma service. S/p emergent fasciotomy on 8/19 for compartment syndrome. Management per primary, s/p surgery for 8/24, note pending  Alcohol withdrawal ([name]) Librium discontinued  Currently on ativan per ciwa, renewed - continues to  score rather highly, I suspect there are other contributing factors to his persistent delirium and elevated CIWA scores - is chronically on benzos/alcohol, so complicated withdrawal not unexpected either High dose thiamine  multivitamin and folic acid   Alcohol use Treating withdrawal as above  Daily consumption. Alcohol level undetectable on admission. Patient hoping to detox. Started on CIWA on admission. Social work consulted.  AKI (acute kidney injury) ([name]) IV fluids UA 20 mg/dl ketones, small Hbg, 11-20 RBC's, rare bacteria -> follow  Resolved, follow   Ileus ([name]) Likely multifactorial in setting of surgery and narcotics. No abdominal pain or emesis.  CT abd/pelvis with evidence of ileus 8/24, increase in amount of small bowel gas suggesting ileus or partial obstruction  Plain films 8/25 with mild to moderate small bowel dilatation CT with concern for high grade SBO, though apparently had BM this morning, not c/w SBO -> appreciate surgery recs NG tube replaced 8/26 -> 1.2 L out yesterday  Appreciate surgery recommendations  Compartment syndrome of left lower extremity, initial encounter ([name]) See problem, Tibial plateau fracture, left.  Prolonged QT interval Hold antiemetics, holding zoloft Avoid QT prolonging agents If needed, consider ativan for nausea Replace lytes, mag >2 and K.4 Repeat daily EKG's - improved today, follow If not improving, will discuss with cards  Acute anemia S/p 3 units pRBC Fluctuating, downtrended today Anemia labs, trend  Acute metabolic encephalopathy Patient with an ammonia of 96 on admission but without significant confusion at that time.  He seems more confused now. Likely multifactorial with alcohol withdrawal. Ammonia recently fluctuating from normal (8/24) to mildly elevated.   I don't think  EXTREMITY VENOUS (DVT)  Result Date: 07/09/2022  Lower Venous DVT Study Patient Name:  Ryan Lynch  Date of Exam:   07/09/2022 Medical Rec #: [id number]    Accession #:    [id number] Date of Birth: 05-Jun-1981    Patient Gender: M Patient Age:   4 years Exam Location:  [name] Procedure:      VAS [place] LOWER EXTREMITY VENOUS (DVT) Referring Phys: Ainsley Spinner --------------------------------------------------------------------------------  Indications: Fever of unknown origin.  Risk Factors: Status post 4 compartment fasciotomy with closure 07/08/22. ORIF of left Tibial plateau and fasciotomy 07/05/22 Limitations: Bandages and open wound. Comparison Study: No prior study Performing Technologist: Sharion Dove RVS  Examination Guidelines: Kameryn Davern complete evaluation includes B-mode imaging, spectral Doppler, color Doppler, and power Doppler as needed of all accessible portions of each vessel. Bilateral testing is considered an integral part of Danea Manter complete examination. Limited examinations for reoccurring indications may be performed as noted. The reflux portion of the exam is performed with the patient in reverse Trendelenburg.  +---------+---------------+---------+-----------+----------+--------------+ RIGHT    CompressibilityPhasicitySpontaneityPropertiesThrombus Aging +---------+---------------+---------+-----------+----------+--------------+ CFV      Full           Yes      Yes                                 +---------+---------------+---------+-----------+----------+--------------+ SFJ      Full                                                         +---------+---------------+---------+-----------+----------+--------------+ FV Prox  Full                                                        +---------+---------------+---------+-----------+----------+--------------+ FV Mid   Full                                                        +---------+---------------+---------+-----------+----------+--------------+ FV DistalFull                                                        +---------+---------------+---------+-----------+----------+--------------+ PFV      Full                                                        +---------+---------------+---------+-----------+----------+--------------+ POP      Full           Yes      Yes                                 +---------+---------------+---------+-----------+----------+--------------+  EXTREMITY VENOUS (DVT)  Result Date: 07/09/2022  Lower Venous DVT Study Patient Name:  Ryan Lynch  Date of Exam:   07/09/2022 Medical Rec #: [id number]    Accession #:    [id number] Date of Birth: 05-Jun-1981    Patient Gender: M Patient Age:   4 years Exam Location:  [name] Procedure:      VAS [place] LOWER EXTREMITY VENOUS (DVT) Referring Phys: Ainsley Spinner --------------------------------------------------------------------------------  Indications: Fever of unknown origin.  Risk Factors: Status post 4 compartment fasciotomy with closure 07/08/22. ORIF of left Tibial plateau and fasciotomy 07/05/22 Limitations: Bandages and open wound. Comparison Study: No prior study Performing Technologist: Sharion Dove RVS  Examination Guidelines: Kameryn Davern complete evaluation includes B-mode imaging, spectral Doppler, color Doppler, and power Doppler as needed of all accessible portions of each vessel. Bilateral testing is considered an integral part of Danea Manter complete examination. Limited examinations for reoccurring indications may be performed as noted. The reflux portion of the exam is performed with the patient in reverse Trendelenburg.  +---------+---------------+---------+-----------+----------+--------------+ RIGHT    CompressibilityPhasicitySpontaneityPropertiesThrombus Aging +---------+---------------+---------+-----------+----------+--------------+ CFV      Full           Yes      Yes                                 +---------+---------------+---------+-----------+----------+--------------+ SFJ      Full                                                         +---------+---------------+---------+-----------+----------+--------------+ FV Prox  Full                                                        +---------+---------------+---------+-----------+----------+--------------+ FV Mid   Full                                                        +---------+---------------+---------+-----------+----------+--------------+ FV DistalFull                                                        +---------+---------------+---------+-----------+----------+--------------+ PFV      Full                                                        +---------+---------------+---------+-----------+----------+--------------+ POP      Full           Yes      Yes                                 +---------+---------------+---------+-----------+----------+--------------+  EXTREMITY VENOUS (DVT)  Result Date: 07/09/2022  Lower Venous DVT Study Patient Name:  Ryan Lynch  Date of Exam:   07/09/2022 Medical Rec #: [id number]    Accession #:    [id number] Date of Birth: 05-Jun-1981    Patient Gender: M Patient Age:   4 years Exam Location:  [name] Procedure:      VAS [place] LOWER EXTREMITY VENOUS (DVT) Referring Phys: Ainsley Spinner --------------------------------------------------------------------------------  Indications: Fever of unknown origin.  Risk Factors: Status post 4 compartment fasciotomy with closure 07/08/22. ORIF of left Tibial plateau and fasciotomy 07/05/22 Limitations: Bandages and open wound. Comparison Study: No prior study Performing Technologist: Sharion Dove RVS  Examination Guidelines: Kameryn Davern complete evaluation includes B-mode imaging, spectral Doppler, color Doppler, and power Doppler as needed of all accessible portions of each vessel. Bilateral testing is considered an integral part of Danea Manter complete examination. Limited examinations for reoccurring indications may be performed as noted. The reflux portion of the exam is performed with the patient in reverse Trendelenburg.  +---------+---------------+---------+-----------+----------+--------------+ RIGHT    CompressibilityPhasicitySpontaneityPropertiesThrombus Aging +---------+---------------+---------+-----------+----------+--------------+ CFV      Full           Yes      Yes                                 +---------+---------------+---------+-----------+----------+--------------+ SFJ      Full                                                         +---------+---------------+---------+-----------+----------+--------------+ FV Prox  Full                                                        +---------+---------------+---------+-----------+----------+--------------+ FV Mid   Full                                                        +---------+---------------+---------+-----------+----------+--------------+ FV DistalFull                                                        +---------+---------------+---------+-----------+----------+--------------+ PFV      Full                                                        +---------+---------------+---------+-----------+----------+--------------+ POP      Full           Yes      Yes                                 +---------+---------------+---------+-----------+----------+--------------+  EXTREMITY VENOUS (DVT)  Result Date: 07/09/2022  Lower Venous DVT Study Patient Name:  Ryan Lynch  Date of Exam:   07/09/2022 Medical Rec #: [id number]    Accession #:    [id number] Date of Birth: 05-Jun-1981    Patient Gender: M Patient Age:   4 years Exam Location:  [name] Procedure:      VAS [place] LOWER EXTREMITY VENOUS (DVT) Referring Phys: Ainsley Spinner --------------------------------------------------------------------------------  Indications: Fever of unknown origin.  Risk Factors: Status post 4 compartment fasciotomy with closure 07/08/22. ORIF of left Tibial plateau and fasciotomy 07/05/22 Limitations: Bandages and open wound. Comparison Study: No prior study Performing Technologist: Sharion Dove RVS  Examination Guidelines: Kameryn Davern complete evaluation includes B-mode imaging, spectral Doppler, color Doppler, and power Doppler as needed of all accessible portions of each vessel. Bilateral testing is considered an integral part of Danea Manter complete examination. Limited examinations for reoccurring indications may be performed as noted. The reflux portion of the exam is performed with the patient in reverse Trendelenburg.  +---------+---------------+---------+-----------+----------+--------------+ RIGHT    CompressibilityPhasicitySpontaneityPropertiesThrombus Aging +---------+---------------+---------+-----------+----------+--------------+ CFV      Full           Yes      Yes                                 +---------+---------------+---------+-----------+----------+--------------+ SFJ      Full                                                         +---------+---------------+---------+-----------+----------+--------------+ FV Prox  Full                                                        +---------+---------------+---------+-----------+----------+--------------+ FV Mid   Full                                                        +---------+---------------+---------+-----------+----------+--------------+ FV DistalFull                                                        +---------+---------------+---------+-----------+----------+--------------+ PFV      Full                                                        +---------+---------------+---------+-----------+----------+--------------+ POP      Full           Yes      Yes                                 +---------+---------------+---------+-----------+----------+--------------+  PROGRESS NOTE    Ryan Lynch  DTO:[id number] DOB: [birth year] DOA: 07/02/2022 PCP: Patient, No Pcp Per  Chief Complaint  Patient presents with   Fall    Brief Narrative:  Ryan Lynch is Ryan Lynch 40 y.o. male with Ryan Lynch history of alcohol use and anxiety. Patient presented secondary to Ryan Lynch fall down his stairs and subsequent worsening pain. Upon arrival to the ED, he was found to have evidence of Ryan Lynch left tibial fracture and evidence of compartment syndrome, requiring emergent fasciotomy. Patient has developed moderate alcohol withdrawal symptoms.    Assessment & Plan:   Principal Problem:   Observation after surgery Active Problems:   Tibial plateau fracture, left   Sepsis ([name])   Alcohol withdrawal ([name])   Alcohol use   Compartment syndrome of left lower extremity, initial encounter ([name])   Ileus ([name])   AKI (acute kidney injury) ([name])   Transaminitis   Traumatic rhabdomyolysis ([name])   Acute metabolic encephalopathy   Acute anemia   Prolonged QT interval   Adrenal nodule ([name])   Thrombocytopenia ([name])   Compression fracture of T7 vertebra ([name])   Acute urinary retention   Anxiety   Psoriasis   Assessment and Plan: Sepsis ([name]) Tachycardic, febrile Unclear source at this point, ddx remains broad Need to w/u infectious and non infectious causes CT chest/abd/pelvis with smal to moderate right and small left pleural effusions, tightly contracted vs procelain gallbladder, high grade distal SBO, stranding along side ascending and descending colon Per discussion with orthopedics, wound looks good unasyn started Blood cultures Has indwelling foley for retention, if unrevealing workup, will need UA/culture  Tibial plateau fracture, left Patient admitted to trauma service. S/p emergent fasciotomy on 8/19 for compartment syndrome. Management per primary, s/p surgery for 8/24, note pending  Alcohol withdrawal ([name]) Librium discontinued  Currently on ativan per ciwa, renewed - continues to  score rather highly, I suspect there are other contributing factors to his persistent delirium and elevated CIWA scores - is chronically on benzos/alcohol, so complicated withdrawal not unexpected either High dose thiamine  multivitamin and folic acid   Alcohol use Treating withdrawal as above  Daily consumption. Alcohol level undetectable on admission. Patient hoping to detox. Started on CIWA on admission. Social work consulted.  AKI (acute kidney injury) ([name]) IV fluids UA 20 mg/dl ketones, small Hbg, 11-20 RBC's, rare bacteria -> follow  Resolved, follow   Ileus ([name]) Likely multifactorial in setting of surgery and narcotics. No abdominal pain or emesis.  CT abd/pelvis with evidence of ileus 8/24, increase in amount of small bowel gas suggesting ileus or partial obstruction  Plain films 8/25 with mild to moderate small bowel dilatation CT with concern for high grade SBO, though apparently had BM this morning, not c/w SBO -> appreciate surgery recs NG tube replaced 8/26 -> 1.2 L out yesterday  Appreciate surgery recommendations  Compartment syndrome of left lower extremity, initial encounter ([name]) See problem, Tibial plateau fracture, left.  Prolonged QT interval Hold antiemetics, holding zoloft Avoid QT prolonging agents If needed, consider ativan for nausea Replace lytes, mag >2 and K.4 Repeat daily EKG's - improved today, follow If not improving, will discuss with cards  Acute anemia S/p 3 units pRBC Fluctuating, downtrended today Anemia labs, trend  Acute metabolic encephalopathy Patient with an ammonia of 96 on admission but without significant confusion at that time.  He seems more confused now. Likely multifactorial with alcohol withdrawal. Ammonia recently fluctuating from normal (8/24) to mildly elevated.   I don't think  EXTREMITY VENOUS (DVT)  Result Date: 07/09/2022  Lower Venous DVT Study Patient Name:  Ryan Lynch  Date of Exam:   07/09/2022 Medical Rec #: [id number]    Accession #:    [id number] Date of Birth: 05-Jun-1981    Patient Gender: M Patient Age:   4 years Exam Location:  [name] Procedure:      VAS [place] LOWER EXTREMITY VENOUS (DVT) Referring Phys: Ainsley Spinner --------------------------------------------------------------------------------  Indications: Fever of unknown origin.  Risk Factors: Status post 4 compartment fasciotomy with closure 07/08/22. ORIF of left Tibial plateau and fasciotomy 07/05/22 Limitations: Bandages and open wound. Comparison Study: No prior study Performing Technologist: Sharion Dove RVS  Examination Guidelines: Kameryn Davern complete evaluation includes B-mode imaging, spectral Doppler, color Doppler, and power Doppler as needed of all accessible portions of each vessel. Bilateral testing is considered an integral part of Danea Manter complete examination. Limited examinations for reoccurring indications may be performed as noted. The reflux portion of the exam is performed with the patient in reverse Trendelenburg.  +---------+---------------+---------+-----------+----------+--------------+ RIGHT    CompressibilityPhasicitySpontaneityPropertiesThrombus Aging +---------+---------------+---------+-----------+----------+--------------+ CFV      Full           Yes      Yes                                 +---------+---------------+---------+-----------+----------+--------------+ SFJ      Full                                                         +---------+---------------+---------+-----------+----------+--------------+ FV Prox  Full                                                        +---------+---------------+---------+-----------+----------+--------------+ FV Mid   Full                                                        +---------+---------------+---------+-----------+----------+--------------+ FV DistalFull                                                        +---------+---------------+---------+-----------+----------+--------------+ PFV      Full                                                        +---------+---------------+---------+-----------+----------+--------------+ POP      Full           Yes      Yes                                 +---------+---------------+---------+-----------+----------+--------------+  EXTREMITY VENOUS (DVT)  Result Date: 07/09/2022  Lower Venous DVT Study Patient Name:  Ryan Lynch  Date of Exam:   07/09/2022 Medical Rec #: [id number]    Accession #:    [id number] Date of Birth: 05-Jun-1981    Patient Gender: M Patient Age:   4 years Exam Location:  [name] Procedure:      VAS [place] LOWER EXTREMITY VENOUS (DVT) Referring Phys: Ainsley Spinner --------------------------------------------------------------------------------  Indications: Fever of unknown origin.  Risk Factors: Status post 4 compartment fasciotomy with closure 07/08/22. ORIF of left Tibial plateau and fasciotomy 07/05/22 Limitations: Bandages and open wound. Comparison Study: No prior study Performing Technologist: Sharion Dove RVS  Examination Guidelines: Kameryn Davern complete evaluation includes B-mode imaging, spectral Doppler, color Doppler, and power Doppler as needed of all accessible portions of each vessel. Bilateral testing is considered an integral part of Danea Manter complete examination. Limited examinations for reoccurring indications may be performed as noted. The reflux portion of the exam is performed with the patient in reverse Trendelenburg.  +---------+---------------+---------+-----------+----------+--------------+ RIGHT    CompressibilityPhasicitySpontaneityPropertiesThrombus Aging +---------+---------------+---------+-----------+----------+--------------+ CFV      Full           Yes      Yes                                 +---------+---------------+---------+-----------+----------+--------------+ SFJ      Full                                                         +---------+---------------+---------+-----------+----------+--------------+ FV Prox  Full                                                        +---------+---------------+---------+-----------+----------+--------------+ FV Mid   Full                                                        +---------+---------------+---------+-----------+----------+--------------+ FV DistalFull                                                        +---------+---------------+---------+-----------+----------+--------------+ PFV      Full                                                        +---------+---------------+---------+-----------+----------+--------------+ POP      Full           Yes      Yes                                 +---------+---------------+---------+-----------+----------+--------------+  PROGRESS NOTE    Ryan Lynch  DTO:[id number] DOB: [birth year] DOA: 07/02/2022 PCP: Patient, No Pcp Per  Chief Complaint  Patient presents with   Fall    Brief Narrative:  Ryan Lynch is Ryan Lynch 40 y.o. male with Ryan Lynch history of alcohol use and anxiety. Patient presented secondary to Ryan Lynch fall down his stairs and subsequent worsening pain. Upon arrival to the ED, he was found to have evidence of Ryan Lynch left tibial fracture and evidence of compartment syndrome, requiring emergent fasciotomy. Patient has developed moderate alcohol withdrawal symptoms.    Assessment & Plan:   Principal Problem:   Observation after surgery Active Problems:   Tibial plateau fracture, left   Sepsis ([name])   Alcohol withdrawal ([name])   Alcohol use   Compartment syndrome of left lower extremity, initial encounter ([name])   Ileus ([name])   AKI (acute kidney injury) ([name])   Transaminitis   Traumatic rhabdomyolysis ([name])   Acute metabolic encephalopathy   Acute anemia   Prolonged QT interval   Adrenal nodule ([name])   Thrombocytopenia ([name])   Compression fracture of T7 vertebra ([name])   Acute urinary retention   Anxiety   Psoriasis   Assessment and Plan: Sepsis ([name]) Tachycardic, febrile Unclear source at this point, ddx remains broad Need to w/u infectious and non infectious causes CT chest/abd/pelvis with smal to moderate right and small left pleural effusions, tightly contracted vs procelain gallbladder, high grade distal SBO, stranding along side ascending and descending colon Per discussion with orthopedics, wound looks good unasyn started Blood cultures Has indwelling foley for retention, if unrevealing workup, will need UA/culture  Tibial plateau fracture, left Patient admitted to trauma service. S/p emergent fasciotomy on 8/19 for compartment syndrome. Management per primary, s/p surgery for 8/24, note pending  Alcohol withdrawal ([name]) Librium discontinued  Currently on ativan per ciwa, renewed - continues to  score rather highly, I suspect there are other contributing factors to his persistent delirium and elevated CIWA scores - is chronically on benzos/alcohol, so complicated withdrawal not unexpected either High dose thiamine  multivitamin and folic acid   Alcohol use Treating withdrawal as above  Daily consumption. Alcohol level undetectable on admission. Patient hoping to detox. Started on CIWA on admission. Social work consulted.  AKI (acute kidney injury) ([name]) IV fluids UA 20 mg/dl ketones, small Hbg, 11-20 RBC's, rare bacteria -> follow  Resolved, follow   Ileus ([name]) Likely multifactorial in setting of surgery and narcotics. No abdominal pain or emesis.  CT abd/pelvis with evidence of ileus 8/24, increase in amount of small bowel gas suggesting ileus or partial obstruction  Plain films 8/25 with mild to moderate small bowel dilatation CT with concern for high grade SBO, though apparently had BM this morning, not c/w SBO -> appreciate surgery recs NG tube replaced 8/26 -> 1.2 L out yesterday  Appreciate surgery recommendations  Compartment syndrome of left lower extremity, initial encounter ([name]) See problem, Tibial plateau fracture, left.  Prolonged QT interval Hold antiemetics, holding zoloft Avoid QT prolonging agents If needed, consider ativan for nausea Replace lytes, mag >2 and K.4 Repeat daily EKG's - improved today, follow If not improving, will discuss with cards  Acute anemia S/p 3 units pRBC Fluctuating, downtrended today Anemia labs, trend  Acute metabolic encephalopathy Patient with an ammonia of 96 on admission but without significant confusion at that time.  He seems more confused now. Likely multifactorial with alcohol withdrawal. Ammonia recently fluctuating from normal (8/24) to mildly elevated.   I don't think  EXTREMITY VENOUS (DVT)  Result Date: 07/09/2022  Lower Venous DVT Study Patient Name:  Ryan Lynch  Date of Exam:   07/09/2022 Medical Rec #: [id number]    Accession #:    [id number] Date of Birth: 05-Jun-1981    Patient Gender: M Patient Age:   4 years Exam Location:  [name] Procedure:      VAS [place] LOWER EXTREMITY VENOUS (DVT) Referring Phys: Ainsley Spinner --------------------------------------------------------------------------------  Indications: Fever of unknown origin.  Risk Factors: Status post 4 compartment fasciotomy with closure 07/08/22. ORIF of left Tibial plateau and fasciotomy 07/05/22 Limitations: Bandages and open wound. Comparison Study: No prior study Performing Technologist: Sharion Dove RVS  Examination Guidelines: Kameryn Davern complete evaluation includes B-mode imaging, spectral Doppler, color Doppler, and power Doppler as needed of all accessible portions of each vessel. Bilateral testing is considered an integral part of Danea Manter complete examination. Limited examinations for reoccurring indications may be performed as noted. The reflux portion of the exam is performed with the patient in reverse Trendelenburg.  +---------+---------------+---------+-----------+----------+--------------+ RIGHT    CompressibilityPhasicitySpontaneityPropertiesThrombus Aging +---------+---------------+---------+-----------+----------+--------------+ CFV      Full           Yes      Yes                                 +---------+---------------+---------+-----------+----------+--------------+ SFJ      Full                                                         +---------+---------------+---------+-----------+----------+--------------+ FV Prox  Full                                                        +---------+---------------+---------+-----------+----------+--------------+ FV Mid   Full                                                        +---------+---------------+---------+-----------+----------+--------------+ FV DistalFull                                                        +---------+---------------+---------+-----------+----------+--------------+ PFV      Full                                                        +---------+---------------+---------+-----------+----------+--------------+ POP      Full           Yes      Yes                                 +---------+---------------+---------+-----------+----------+--------------+

## 2022-07-10 NOTE — Progress Notes (Signed)
Peripherally Inserted Central Catheter Placement  The IV Nurse has discussed with the patient and/or persons authorized to consent for the patient, the purpose of this procedure and the potential benefits and risks involved with this procedure.  The benefits include less needle sticks, lab draws from the catheter, and the patient may be discharged home with the catheter. Risks include, but not limited to, infection, bleeding, blood clot (thrombus formation), and puncture of an artery; nerve damage and irregular heartbeat and possibility to perform a PICC exchange if needed/ordered by physician.  Alternatives to this procedure were also discussed.  Bard Power PICC patient education guide, fact sheet on infection prevention and patient information card has been provided to patient /or left at bedside.    PICC Placement Documentation  PICC Double Lumen 07/10/22 Right Brachial 40 cm 0 cm (Active)  Indication for Insertion or Continuance of Line Administration of hyperosmolar/irritating solutions (i.e. TPN, Vancomycin, etc.) 07/10/22 2125  Exposed Catheter (cm) 0 cm 07/10/22 2125  Site Assessment Clean, Dry, Intact 07/10/22 2125  Lumen #1 Status Flushed;Saline locked;Blood return noted 07/10/22 2125  Lumen #2 Status Flushed;Saline locked;Blood return noted 07/10/22 2125  Dressing Type Transparent;Securing device 07/10/22 2125  Dressing Status Antimicrobial disc in place;Clean, Dry, Intact 07/10/22 2125  Dressing Intervention New dressing 07/10/22 2125  Dressing Change Due 07/17/22 07/10/22 2125       Curt Jews 07/10/2022, 9:30 PM

## 2022-07-10 NOTE — Progress Notes (Signed)
General Surgery Follow Up Note  Subjective:    Overnight Issues:   Objective:  Vital signs for last 24 hours: Temp:  [98 F (36.7 C)-101.6 F (38.7 C)] 100 F (37.8 C) (08/27 0704) Pulse Rate:  [102-134] 102 (08/27 0704) Resp:  [18-40] 19 (08/27 0704) BP: (108-151)/(65-89) 142/68 (08/27 0704) SpO2:  [92 %-99 %] 98 % (08/27 0704)  Hemodynamic parameters for last 24 hours:    Intake/Output from previous day: 08/26 0701 - 08/27 0700 In: 1259.3 [I.V.:1159.3; IV Piggyback:100] Out: 4375 [Urine:3175; Emesis/NG output:1200]  Intake/Output this shift: No intake/output data recorded.  Vent settings for last 24 hours:    Physical Exam:  Gen: comfortable, no distress Neuro: non-focal exam HEENT: PERRL Neck: supple CV: RRR Pulm: unlabored breathing Abd: soft, NT GU: clear yellow urine Extr: wwp, no edema   Results for orders placed or performed during the hospital encounter of 07/02/22 (from the past 24 hour(s))  Lactic acid, plasma     Status: None   Collection Time: 07/09/22 11:45 AM  Result Value Ref Range   Lactic Acid, Venous 1.6 0.5 - 1.9 mmol/L  Procalcitonin - Baseline     Status: None   Collection Time: 07/09/22 11:45 AM  Result Value Ref Range   Procalcitonin 0.23 ng/mL  Culture, blood (Routine X 2) w Reflex to ID Panel     Status: None (Preliminary result)   Collection Time: 07/09/22 11:48 AM   Specimen: BLOOD  Result Value Ref Range   Specimen Description BLOOD LEFT ANTECUBITAL    Special Requests      BOTTLES DRAWN AEROBIC AND ANAEROBIC Blood Culture results may not be optimal due to an excessive volume of blood received in culture bottles   Culture      NO GROWTH < 24 HOURS Performed at [facility], [address] [place], [place] [address]    Report Status PENDING   Culture, blood (Routine X 2) w Reflex to ID Panel     Status: None (Preliminary result)   Collection Time: 07/09/22 12:10 PM   Specimen: BLOOD  Result Value Ref Range    Specimen Description BLOOD RIGHT ANTECUBITAL    Special Requests      BOTTLES DRAWN AEROBIC AND ANAEROBIC Blood Culture adequate volume   Culture      NO GROWTH < 24 HOURS Performed at [facility], [address] [place], [place] [address]    Report Status PENDING   D-dimer, quantitative     Status: Abnormal   Collection Time: 07/09/22 12:27 PM  Result Value Ref Range   D-Dimer, Quant 3.68 (H) 0.00 - 0.50 ug/mL-FEU  Procalcitonin     Status: None   Collection Time: 07/10/22  1:32 AM  Result Value Ref Range   Procalcitonin 0.26 ng/mL  Phosphorus     Status: None   Collection Time: 07/10/22  1:32 AM  Result Value Ref Range   Phosphorus 3.3 2.5 - 4.6 mg/dL  CBC with Differential/Platelet     Status: Abnormal   Collection Time: 07/10/22  1:32 AM  Result Value Ref Range   WBC 3.5 (L) 4.0 - 10.5 K/uL   RBC 2.63 (L) 4.22 - 5.81 MIL/uL   Hemoglobin 7.4 (L) 13.0 - 17.0 g/dL   HCT 02.5 (L) 42.7 - 06.2 %   MCV 90.5 80.0 - 100.0 fL   MCH 28.1 26.0 - 34.0 pg   MCHC 31.1 30.0 - 36.0 g/dL   RDW 37.6 (H) 28.3 - 15.1 %  Platelets 126 (L) 150 - 400 K/uL   nRBC 0.0 0.0 - 0.2 %   Neutrophils Relative % 74 %   Neutro Abs 2.6 1.7 - 7.7 K/uL   Lymphocytes Relative 17 %   Lymphs Abs 0.6 (L) 0.7 - 4.0 K/uL   Monocytes Relative 0 %   Monocytes Absolute 0.0 (L) 0.1 - 1.0 K/uL   Eosinophils Relative 3 %   Eosinophils Absolute 0.1 0.0 - 0.5 K/uL   Basophils Relative 0 %   Basophils Absolute 0.0 0.0 - 0.1 K/uL   nRBC 0 0 /100 WBC   Promyelocytes Relative 3 %   Blasts 3 %   Abs Immature Granulocytes 0.10 (H) 0.00 - 0.07 K/uL   Polychromasia PRESENT   Comprehensive metabolic panel     Status: Abnormal   Collection Time: 07/10/22  1:32 AM  Result Value Ref Range   Sodium 140 135 - 145 mmol/L   Potassium 3.5 3.5 - 5.1 mmol/L   Chloride 110 98 - 111 mmol/L   CO2 23 22 - 32 mmol/L   Glucose, Bld 118 (H) 70 - 99 mg/dL   BUN 8 6 - 20 mg/dL   Creatinine, Ser 8.17 0.61 - 1.24 mg/dL    Calcium 7.4 (L) 8.9 - 10.3 mg/dL   Total Protein 4.6 (L) 6.5 - 8.1 g/dL   Albumin 2.0 (L) 3.5 - 5.0 g/dL   AST 25 15 - 41 U/L   ALT 28 0 - 44 U/L   Alkaline Phosphatase 114 38 - 126 U/L   Total Bilirubin 1.3 (H) 0.3 - 1.2 mg/dL   GFR, Estimated >71 >16 mL/min   Anion gap 7 5 - 15  Magnesium     Status: None   Collection Time: 07/10/22  1:32 AM  Result Value Ref Range   Magnesium 2.0 1.7 - 2.4 mg/dL    Assessment & Plan: The plan of care was discussed with the bedside nurse for the day, who is in agreement with this plan and no additional concerns were raised.   Present on Admission:  Compartment syndrome of left lower extremity, initial encounter ([name])    LOS: 8 days   Additional comments:I reviewed the patient's new clinical lab test results.   and I reviewed the patients new imaging test results.    Status post fall with tibial plateau fracture and compartment syndrome Status post fasciotomy and Ex-Fix Ileus EtOH abuse Transaminitis-improving Thrombocytopenia Acute blood loss anemia-stable Acute kidney injury-resolved Elevated total bilirubin-stable at 1.9 Elevated alkaline phosphatase level Bilateral adrenal nodules-agree with outpatient follow-up Compression fracture of T7   Having BMs, but NGT o/p 1.2L o/n, continue NG for now, monitor abdominal exam and trend o/p. Recommend correction of K to goal of 4.   Diamantina Monks, MD Trauma & General Surgery Please use [email or web address] to contact on call provider  07/10/2022  *Care during the described time interval was provided by me. I have reviewed this patient's available data, including medical history, events of note, physical examination and test results as part of my evaluation.

## 2022-07-10 NOTE — Progress Notes (Signed)
Subjective: 3 Days Post-Op Procedure(s) (LRB): IRRIGATION AND DEBRIDEMENT EXTREMITY (Left) SECONDARY CLOSURE OF WOUND (Left) Patient receiving bed bath currently.  Restraints still in place.    Objective: Vital signs in last 24 hours: Temp:  [98 F (36.7 C)-101.6 F (38.7 C)] 100 F (37.8 C) (08/27 0704) Pulse Rate:  [103-134] 103 (08/27 0455) Resp:  [18-40] 19 (08/27 0704) BP: (108-151)/(65-89) 139/69 (08/27 0455) SpO2:  [92 %-99 %] 97 % (08/27 0455)  Intake/Output from previous day: 08/26 0701 - 08/27 0700 In: 1259.3 [I.V.:1159.3; IV Piggyback:100] Out: 4375 [Urine:3175; Emesis/NG output:1200] Intake/Output this shift: No intake/output data recorded.  Recent Labs    07/08/22 0243 07/08/22 1747 07/09/22 0232 07/10/22 0132  HGB 7.5* 8.6* 8.8* 7.4*   Recent Labs    07/09/22 0232 07/10/22 0132  WBC 6.8 3.5*  RBC 3.08* 2.63*  HCT 27.6* 23.8*  PLT 168 126*   Recent Labs    07/09/22 0232 07/10/22 0132  NA 140 140  K 3.2* 3.5  CL 111 110  CO2 22 23  BUN 5* 8  CREATININE 0.67 0.86  GLUCOSE 166* 118*  CALCIUM 8.0* 7.4*   No results for input(s): "LABPT", "INR" in the last 72 hours.  Incision: dressing C/D/I   Assessment/Plan: 3 Days Post-Op Procedure(s) (LRB): IRRIGATION AND DEBRIDEMENT EXTREMITY (Left) SECONDARY CLOSURE OF WOUND (Left)  Weightbearing: NWB LLE Insicional and dressing care: Dressings left intact until follow-up and Reinforce dressings as needed Orthopedic device(s):  bledsoe knee brace Showering: Keep dressing dry VTE prophylaxis:  has been on hold due to thrombocytopenia  , SCDs, ambulation Pain control: continue current regimen Follow - up plan:  with Dr. Myrna Blazer Cecilie Heidel 07/10/2022, 9:33 AM

## 2022-07-11 ENCOUNTER — Inpatient Hospital Stay (HOSPITAL_COMMUNITY): Payer: BC Managed Care – PPO

## 2022-07-11 DIAGNOSIS — S2249XA Multiple fractures of ribs, unspecified side, initial encounter for closed fracture: Secondary | ICD-10-CM

## 2022-07-11 DIAGNOSIS — Z4889 Encounter for other specified surgical aftercare: Secondary | ICD-10-CM | POA: Diagnosis not present

## 2022-07-11 DIAGNOSIS — S22000A Wedge compression fracture of unspecified thoracic vertebra, initial encounter for closed fracture: Secondary | ICD-10-CM

## 2022-07-11 LAB — CBC WITH DIFFERENTIAL/PLATELET
Abs Immature Granulocytes: 0.03 10*3/uL (ref 0.00–0.07)
Basophils Absolute: 0 10*3/uL (ref 0.0–0.1)
Basophils Relative: 1 %
Eosinophils Absolute: 0.1 10*3/uL (ref 0.0–0.5)
Eosinophils Relative: 2 %
HCT: 24.6 % — ABNORMAL LOW (ref 39.0–52.0)
Hemoglobin: 7.4 g/dL — ABNORMAL LOW (ref 13.0–17.0)
Immature Granulocytes: 1 %
Lymphocytes Relative: 23 %
Lymphs Abs: 1 10*3/uL (ref 0.7–4.0)
MCH: 27.9 pg (ref 26.0–34.0)
MCHC: 30.1 g/dL (ref 30.0–36.0)
MCV: 92.8 fL (ref 80.0–100.0)
Monocytes Absolute: 0.4 10*3/uL (ref 0.1–1.0)
Monocytes Relative: 9 %
Neutro Abs: 2.7 10*3/uL (ref 1.7–7.7)
Neutrophils Relative %: 64 %
Platelets: 134 10*3/uL — ABNORMAL LOW (ref 150–400)
RBC: 2.65 MIL/uL — ABNORMAL LOW (ref 4.22–5.81)
RDW: 17.4 % — ABNORMAL HIGH (ref 11.5–15.5)
WBC: 4.1 10*3/uL (ref 4.0–10.5)
nRBC: 0 % (ref 0.0–0.2)

## 2022-07-11 LAB — IRON AND TIBC
Iron: 18 ug/dL — ABNORMAL LOW (ref 45–182)
Saturation Ratios: 5 % — ABNORMAL LOW (ref 17.9–39.5)
TIBC: 332 ug/dL (ref 250–450)
UIBC: 314 ug/dL

## 2022-07-11 LAB — VITAMIN B12: Vitamin B-12: 528 pg/mL (ref 180–914)

## 2022-07-11 LAB — FERRITIN: Ferritin: 26 ng/mL (ref 24–336)

## 2022-07-11 LAB — COMPREHENSIVE METABOLIC PANEL
ALT: 26 U/L (ref 0–44)
AST: 23 U/L (ref 15–41)
Albumin: 2.1 g/dL — ABNORMAL LOW (ref 3.5–5.0)
Alkaline Phosphatase: 123 U/L (ref 38–126)
Anion gap: 5 (ref 5–15)
BUN: 5 mg/dL — ABNORMAL LOW (ref 6–20)
CO2: 23 mmol/L (ref 22–32)
Calcium: 7.9 mg/dL — ABNORMAL LOW (ref 8.9–10.3)
Chloride: 117 mmol/L — ABNORMAL HIGH (ref 98–111)
Creatinine, Ser: 0.69 mg/dL (ref 0.61–1.24)
GFR, Estimated: >60 mL/min (ref >60)
Glucose, Bld: 113 mg/dL — ABNORMAL HIGH (ref 70–99)
Potassium: 3.6 mmol/L (ref 3.5–5.1)
Sodium: 145 mmol/L (ref 135–145)
Total Bilirubin: 1.1 mg/dL (ref 0.3–1.2)
Total Protein: 5.2 g/dL — ABNORMAL LOW (ref 6.5–8.1)

## 2022-07-11 LAB — TRIGLYCERIDES: Triglycerides: 155 mg/dL — ABNORMAL HIGH (ref <150)

## 2022-07-11 LAB — PROCALCITONIN: Procalcitonin: <0.1 ng/mL

## 2022-07-11 LAB — GLUCOSE, CAPILLARY
Glucose-Capillary: 100 mg/dL — ABNORMAL HIGH (ref 70–99)
Glucose-Capillary: 105 mg/dL — ABNORMAL HIGH (ref 70–99)

## 2022-07-11 LAB — FOLATE: Folate: 13.7 ng/mL (ref >5.9)

## 2022-07-11 LAB — PHOSPHORUS: Phosphorus: 2.9 mg/dL (ref 2.5–4.6)

## 2022-07-11 LAB — MAGNESIUM: Magnesium: 2.4 mg/dL (ref 1.7–2.4)

## 2022-07-11 LAB — AMMONIA: Ammonia: 56 umol/L — ABNORMAL HIGH (ref 9–35)

## 2022-07-11 MED ORDER — POTASSIUM CHLORIDE 10 MEQ/100ML IV SOLN: 10.0000 meq | INTRAVENOUS | Status: DC

## 2022-07-11 MED ORDER — INSULIN ASPART 100 UNIT/ML IJ SOLN
0.0000 [IU] | INTRAMUSCULAR | Status: DC
  Administered 2022-07-12 – 2022-07-13 (×3): 1 [IU] via SUBCUTANEOUS

## 2022-07-11 MED ORDER — TRAVASOL 10 % IV SOLN
INTRAVENOUS | Status: AC
  Filled 2022-07-11: qty 648

## 2022-07-11 MED ORDER — DEXTROSE IN LACTATED RINGERS 5 % IV SOLN: INTRAVENOUS | Status: AC

## 2022-07-11 MED ORDER — LACTATED RINGERS IV SOLN: INTRAVENOUS | Status: AC

## 2022-07-11 MED ORDER — LACTULOSE ENEMA
300.0000 mL | Freq: Once | ORAL | Status: AC
  Administered 2022-07-11: 300 mL via RECTAL
  Filled 2022-07-11: qty 300

## 2022-07-11 MED ORDER — POTASSIUM CHLORIDE 10 MEQ/50ML IV SOLN
10.0000 meq | INTRAVENOUS | Status: AC
  Administered 2022-07-11 (×6): 10 meq via INTRAVENOUS
  Filled 2022-07-11 (×6): qty 50

## 2022-07-11 MED ORDER — DEXTROSE IN LACTATED RINGERS 5 % IV SOLN: INTRAVENOUS | Status: DC

## 2022-07-11 NOTE — Progress Notes (Signed)
PROGRESS NOTE    Ryan Lynch  QIO:[id number] DOB: 1981-02-18 DOA: 07/02/2022 PCP: Patient, No Pcp Per  Chief Complaint  Patient presents with   Fall    Brief Narrative:  Ryan Lynch is Ryan Lynch 41 y.o. male with Ryan Lynch history of alcohol use and anxiety. Patient presented secondary to Ryan Lynch fall down his stairs and subsequent worsening pain. Upon arrival to the ED, he was found to have evidence of Ryan Lynch left tibial fracture and evidence of compartment syndrome, requiring emergent fasciotomy. Patient has developed moderate alcohol withdrawal symptoms.    Assessment & Plan:   Principal Problem:   Observation after surgery Active Problems:   Tibial plateau fracture, left   Sepsis ([name])   Alcohol withdrawal ([name])   Alcohol use   Compartment syndrome of left lower extremity, initial encounter ([name])   Ileus ([name])   AKI (acute kidney injury) ([name])   Transaminitis   Traumatic rhabdomyolysis ([name])   Acute metabolic encephalopathy   Acute anemia   Prolonged QT interval   Adrenal nodule ([name])   Thrombocytopenia ([name])   Compression fracture of T7 vertebra ([name])   Acute urinary retention   Anxiety   Psoriasis   Assessment and Plan: Sepsis ([name]) Tachycardic, febrile Unclear source at this point, ddx remains broad Need to w/u infectious and non infectious causes CT chest/abd/pelvis with smal to moderate right and small left pleural effusions, tightly contracted vs procelain gallbladder, high grade distal SBO, stranding along side ascending and descending colon Per discussion with orthopedics, wound looks good unasyn started Blood cultures Has indwelling foley for retention, if unrevealing workup, will need UA/culture  Tibial plateau fracture, left Patient admitted to trauma service. S/p emergent fasciotomy on 8/19 for compartment syndrome. Management per primary, s/p surgery for 8/24, note pending  Alcohol withdrawal ([name]) Librium discontinued  Currently on ativan per ciwa, renewed - continues to  score rather highly, I suspect there are other contributing factors to his persistent delirium and elevated CIWA scores - is chronically on benzos/alcohol, so complicated withdrawal not unexpected either High dose thiamine  multivitamin and folic acid   Alcohol use Treating withdrawal as above  Daily consumption. Alcohol level undetectable on admission. Patient hoping to detox. Started on CIWA on admission. Social work consulted.  AKI (acute kidney injury) ([name]) IV fluids UA 20 mg/dl ketones, small Hbg, 32-44 RBC's, rare bacteria -> follow  Resolved, follow   Ileus ([name]) Likely multifactorial in setting of surgery and narcotics. No abdominal pain or emesis.  CT abd/pelvis with evidence of ileus 8/24, increase in amount of small bowel gas suggesting ileus or partial obstruction  Plain films 8/25 with mild to moderate small bowel dilatation CT with concern for high grade SBO, though apparently had BM this morning, not c/w SBO -> appreciate surgery recs NG tube replaced 8/26 -> 3 L out yesterday  Appreciate surgery recommendations Planning for PICC and TPN  Compartment syndrome of left lower extremity, initial encounter ([name]) See problem, Tibial plateau fracture, left.  Prolonged QT interval Hold antiemetics, holding zoloft Avoid QT prolonging agents If needed, consider ativan for nausea Replace lytes, mag >2 and K.4 Repeat daily EKG's - improved today, follow  Acute anemia S/p 3 units pRBC Fluctuating, downtrended today Anemia labs, trend  Acute metabolic encephalopathy Patient with an ammonia of 96 on admission but without significant confusion at that time.  He seems more confused now. Likely multifactorial with alcohol withdrawal. Ammonia recently fluctuating from normal (8/24) to mildly elevated.   I don't think this pattern  of elevation c/w with hepatic encephalopathy Lactulose given x1 given continued encephalopathy with elevated ammonia CIWA as above High dose  thiamine Normal B12, folate Will work up additionally as needed  Traumatic rhabdomyolysis ([name]) Secondary to fall down the stairs. Resultant compartment syndrome. CK of 1,513, down to 975 but has increased.  CK improved  Continue IVF   Transaminitis AST/ALT elevated to 188/59 on admission respectively Secondary to rhabdomyolysis from compartment syndrome and possibly alcohol use.  Generally improved Does have elevated alk phos, mild bili elevation today Will continue to monitor  Thrombocytopenia ([name]) Likely related to trauma and blood loss. Patient with evidence of hepatic steatosis and mild splenomegaly on imaging. No prior labs available. Platelets trended down but appear to have stabilized. -Trend CBC -Watch for bleeding  Adrenal nodule ([name]) Bilateral with Ryan Lynch left 16 mm indeterminate left adrenal hypodense mass and Ryan Lynch right 10 mm indeterminate right adrenal gland mass. Recommendations for outpatient non-emergent CT or MRI of the adrenal glands.  Acute urinary retention With indwelling foley Continue for now flomax when able to take PO  Compression fracture of T7 vertebra ([name]) -Pain management per primary  Anxiety  Hold zoloft -Hold Klonopin while on CIWA/Librium taper  Psoriasis Noted on exam and now notice in history. Not on medication therapy per medication history.      DVT prophylaxis: SCD, per primary Code Status: full Family Communication: none - father over phone Disposition:   Status is: Inpatient Remains inpatient appropriate because: additional treatment for encephalopathy, ortho   Consultants:  orthopedics  Procedures:  8/19 1. EMERGENT FOUR COMPARTMENT FASCIOTOMIES LEFT LEG  Antimicrobials:  Anti-infectives (From admission, onward)    Start     Dose/Rate Route Frequency Ordered Stop   07/09/22 1300  Ampicillin-Sulbactam (UNASYN) 3 g in sodium chloride 0.9 % 100 mL IVPB        3 g 200 mL/hr over 30 Minutes Intravenous Every 6 hours  07/09/22 1203     07/07/22 1000  ceFAZolin (ANCEF) IVPB 2g/100 mL premix        2 g 200 mL/hr over 30 Minutes Intravenous On call to O.R. 07/06/22 1001 07/07/22 1136   07/05/22 1800  ceFAZolin (ANCEF) IVPB 2g/100 mL premix        2 g 200 mL/hr over 30 Minutes Intravenous Every 8 hours 07/05/22 1540 07/06/22 1445   07/05/22 0600  ceFAZolin (ANCEF) IVPB 2g/100 mL premix        2 g 200 mL/hr over 30 Minutes Intravenous On call to O.R. 07/04/22 1225 07/05/22 0920   07/02/22 1830  ceFAZolin (ANCEF) IVPB 1 g/50 mL premix        1 g 100 mL/hr over 30 Minutes Intravenous Every 6 hours 07/02/22 1731 07/03/22 2006   07/02/22 1430  ceFAZolin (ANCEF) IVPB 1 g/50 mL premix        1 g 100 mL/hr over 30 Minutes Intravenous  Once 07/02/22 1419 07/03/22 2006       Subjective: Pain better today  Objective: Vitals:   07/10/22 1512 07/10/22 2050 07/11/22 0748 07/11/22 1148  BP: 114/75 (!) 146/72 (!) 148/70 (!) 153/71  Pulse: 92 90 92 90  Resp: 19 (!) 21 19 (!) 26  Temp: 99.4 F (37.4 C) 99.1 F (37.3 C) 98.4 F (36.9 C) 98.2 F (36.8 C)  TempSrc: Oral Oral    SpO2: 98% 99% 100% 100%  Weight:      Height:        Intake/Output Summary (Last 24 hours) at  07/11/2022 1707 Last data filed at 07/11/2022 0600 Gross per 24 hour  Intake 2483.49 ml  Output 1800 ml  Net 683.49 ml   Filed Weights   07/02/22 1300 07/03/22 2222  Weight: 86.2 kg 91.6 kg    Examination:  General: No acute distress. Cardiovascular: RRR Lungs: unlabored Abdomen: Soft, nontender, nondistended  Neurological: Alert and oriented 3. Lethargic, but seems less confused. Extremities: dressing to LLE   Data Reviewed: I have personally reviewed following labs and imaging studies  CBC: Recent Labs  Lab 07/07/22 0159 07/08/22 0243 07/08/22 1747 07/09/22 0232 07/10/22 0132 07/11/22 0301  WBC 10.5 3.6*  --  6.8 3.5* 4.1  NEUTROABS 8.0* 2.4  --  5.4 2.6 2.7  HGB 8.7* 7.5* 8.6* 8.8* 7.4* 7.4*  HCT 26.0* 23.4*  26.3* 27.6* 23.8* 24.6*  MCV 101.2* 89.7  --  89.6 90.5 92.8  PLT 132* 87*  --  168 126* 134*    Basic Metabolic Panel: Recent Labs  Lab 07/07/22 0159 07/08/22 0243 07/09/22 0232 07/10/22 0132 07/11/22 0301  NA 136 140 140 140 145  K 4.4 3.1* 3.2* 3.5 3.6  CL 103 109 111 110 117*  CO2 25 23 22 23 23   GLUCOSE 91 110* 166* 118* 113*  BUN 16 6 5* 8 5*  CREATININE 1.97* 0.63 0.67 0.86 0.69  CALCIUM 8.5* 7.8* 8.0* 7.4* 7.9*  MG 2.0 2.2 2.1 2.0 2.4  PHOS 3.7 2.6 2.0* 3.3 2.9    GFR: Estimated Creatinine Clearance: 139.2 mL/min (by C-G formula based on SCr of 0.69 mg/dL).  Liver Function Tests: Recent Labs  Lab 07/07/22 0159 07/08/22 0243 07/09/22 0232 07/10/22 0132 07/11/22 0301  AST 25 47* 44* 25 23  ALT 18 36 39 28 26  ALKPHOS 64 58 131* 114 123  BILITOT 0.8 1.9* 1.9* 1.3* 1.1  PROT 6.6 5.0* 5.5* 4.6* 5.2*  ALBUMIN 2.1* 2.3* 2.4* 2.0* 2.1*    CBG: Recent Labs  Lab 07/04/22 2059  GLUCAP 128*     Recent Results (from the past 240 hour(s))  Resp Panel by RT-PCR (Flu Shariq Puig&B, Covid) Anterior Nasal Swab     Status: None   Collection Time: 07/02/22 10:44 AM   Specimen: Anterior Nasal Swab  Result Value Ref Range Status   SARS Coronavirus 2 by RT PCR NEGATIVE NEGATIVE Final    Comment: (NOTE) SARS-CoV-2 target nucleic acids are NOT DETECTED.  The SARS-CoV-2 RNA is generally detectable in upper respiratory specimens during the acute phase of infection. The lowest concentration of SARS-CoV-2 viral copies this assay can detect is 138 copies/mL. Longino Trefz negative result does not preclude SARS-Cov-2 infection and should not be used as the sole basis for treatment or other patient management decisions. Fajr Fife negative result may occur with  improper specimen collection/handling, submission of specimen other than nasopharyngeal swab, presence of viral mutation(s) within the areas targeted by this assay, and inadequate number of viral copies(<138 copies/mL). Marrietta Thunder negative result must  be combined with clinical observations, patient history, and epidemiological information. The expected result is Negative.  Fact Sheet for Patients:  BloggerCourse.com  Fact Sheet for Healthcare Providers:  SeriousBroker.it  This test is no t yet approved or cleared by the [place] FDA and  has been authorized for detection and/or diagnosis of SARS-CoV-2 by FDA under an Emergency Use Authorization (EUA). This EUA will remain  in effect (meaning this test can be used) for the duration of the COVID-19 declaration under Section 564(b)(1) of the Act, 21 U.S.C.section 360bbb-3(b)(1), unless  the authorization is terminated  or revoked sooner.       Influenza Acelyn Basham by PCR NEGATIVE NEGATIVE Final   Influenza B by PCR NEGATIVE NEGATIVE Final    Comment: (NOTE) The Xpert Xpress SARS-CoV-2/FLU/RSV plus assay is intended as an aid in the diagnosis of influenza from Nasopharyngeal swab specimens and should not be used as Dezaree Tracey sole basis for treatment. Nasal washings and aspirates are unacceptable for Xpert Xpress SARS-CoV-2/FLU/RSV testing.  Fact Sheet for Patients: BloggerCourse.com  Fact Sheet for Healthcare Providers: SeriousBroker.it  This test is not yet approved or cleared by the [place] FDA and has been authorized for detection and/or diagnosis of SARS-CoV-2 by FDA under an Emergency Use Authorization (EUA). This EUA will remain in effect (meaning this test can be used) for the duration of the COVID-19 declaration under Section 564(b)(1) of the Act, 21 U.S.C. section 360bbb-3(b)(1), unless the authorization is terminated or revoked.  Performed at [facility], [address], [place], [place] [address]   Culture, blood (Routine X 2) w Reflex to ID Panel     Status: None (Preliminary result)   Collection Time: 07/09/22 11:48 AM   Specimen: BLOOD  Result  Value Ref Range Status   Specimen Description BLOOD LEFT ANTECUBITAL  Final   Special Requests   Final    BOTTLES DRAWN AEROBIC AND ANAEROBIC Blood Culture results may not be optimal due to an excessive volume of blood received in culture bottles   Culture   Final    NO GROWTH 2 DAYS Performed at [facility], [address] [place], [place] [address]    Report Status PENDING  Incomplete  Culture, blood (Routine X 2) w Reflex to ID Panel     Status: None (Preliminary result)   Collection Time: 07/09/22 12:10 PM   Specimen: BLOOD  Result Value Ref Range Status   Specimen Description BLOOD RIGHT ANTECUBITAL  Final   Special Requests   Final    BOTTLES DRAWN AEROBIC AND ANAEROBIC Blood Culture adequate volume   Culture   Final    NO GROWTH 2 DAYS Performed at [facility], [address] [place], [place] [address]    Report Status PENDING  Incomplete         Radiology Studies: DG Abd Portable 1V-Small Bowel Obstruction Protocol-initial, 8 hr delay  Result Date: 07/11/2022 CLINICAL DATA:  Small-bowel obstruction. EXAM: PORTABLE ABDOMEN - 1 VIEW COMPARISON:  Portable study yesterday at 7:45 p.m. FINDINGS: There are multiple dilated small bowel segments in the with lower abdomen, maximum caliber 4 cm, previously 3.7 cm. Contrast intermixed with bowel gas continues to be seen in the descending colon through into the rectum. No colonic dilatation is seen. There is no supine evidence of free air. No new calcification is seen with right upper quadrant gallbladder calcification again partially visible. NGT is in place with the tip abutting the wall of the body of stomach. IMPRESSION: Slight worsening in small bowel dilatation, was previously up to 3.7 cm now maximum 4 cm. Contrast in the normal caliber left-sided colon and rectum. Electronically Signed   By: Almira Bar M.D.   On: 07/11/2022 06:30   DG Abd Portable 1V-Small Bowel Protocol-Position Verification  Result Date:  07/10/2022 CLINICAL DATA:  Check NG placement EXAM: PORTABLE ABDOMEN - 1 VIEW COMPARISON:  None Available. FINDINGS: Scattered large and small bowel gas is noted. Findings of small-bowel obstruction are seen. Gastric catheter is noted with the tip in the  stomach. Proximal side port lies at the gastroesophageal junction and could be advanced further into the stomach. Calcification is again noted in the right upper quadrant related to the gallbladder. IMPRESSION: Gastric catheter as described. This could be advanced slightly further into the stomach. Changes consistent with small bowel obstruction. Electronically Signed   By: Alcide Clever M.D.   On: 07/10/2022 20:05   [place] EKG SITE RITE  Result Date: 07/10/2022 If [facility] image not attached, placement could not be confirmed due to current cardiac rhythm.  CT Angio Chest Pulmonary Embolism (PE) W or WO Contrast  Result Date: 07/10/2022 CLINICAL DATA:  Inpatient encounter for abdominal pain. Pulmonary embolus suspected with unknown D-dimer. EXAM: CT ANGIOGRAPHY CHEST CT ABDOMEN AND PELVIS WITH CONTRAST TECHNIQUE: Multidetector CT imaging of the chest was performed using the standard protocol during bolus administration of intravenous contrast. Multiplanar CT image reconstructions and MIPs were obtained to evaluate the vascular anatomy. Multidetector CT imaging of the abdomen and pelvis was performed using the standard protocol during bolus administration of intravenous contrast. RADIATION DOSE REDUCTION: This exam was performed according to the departmental dose-optimization program which includes automated exposure control, adjustment of the mA and/or kV according to patient size and/or use of iterative reconstruction technique. CONTRAST:  OMNIPAQUE IOHEXOL 350 MG/ML SOLN COMPARISON:  The chest, abdomen and pelvis CT with IV contrast 07/02/2022 and the abdomen and pelvis CT no contrast 07/04/2022. No older cross-sectional imaging for comparison. FINDINGS:  CTA CHEST FINDINGS Cardiovascular: Pulmonary arteries are normal caliber but are suboptimally enhanced distal to the main lobar arteries due to bolus timing. Segmental and subsegmental arterial emboli are not excluded on this exam. There are no findings suspicious for acute right heart strain. The heart is borderline prominent. There is scattered three-vessel calcific CAD. There is no pericardial effusion. The pulmonary veins are decompressed. The thoracic aorta and great vessels are normal. Mediastinum/Nodes: The trachea is clear. The lower poles of the thyroid gland are unremarkable. Axillary spaces are clear. Mild superior mediastinal lipomatosis is again noted. There is no intrathoracic adenopathy. The esophagus is increasingly patulous. NGT is now seen in place with the tip abutting the far wall of the body of stomach. There is no esophageal thickening. Lungs/Pleura: Interval development noted Isidor Bromell small to moderate right and small left bilateral layering pleural effusions. There is adjacent consolidation with air bronchograms in the lower lobes which could be simple compressive atelectasis or Daena Alper combination of atelectasis and pneumonic consolidation. There is posterior atelectasis in the right upper lobe. The more anterior lungs are clear. Central airways are widely patent. Musculoskeletal: No chest wall abnormality. Bre Pecina mild upper plate anterior wedge compression fracture of T7 is again shown. No new abnormality of the thoracic cage is seen. There are chronic healed fracture deformities of some of the right anterior ribs but there are fracture deformities which may be more recent again noted involving the anterolateral sixth and seventh ribs and the anterolateral eighth and ninth ribs. Review of the MIP images confirms the above findings. CT ABDOMEN and PELVIS FINDINGS Hepatobiliary: 20 cm in length mildly steatotic liver without mass. There is Yecheskel Kurek stone versus wall calcification in the mid gallbladder but no wall  thickening or biliary dilatation. Spleen: Splenomegaly with splenic length 16.9 cm, unchanged. No splenic mass. Pancreas: No focal abnormality. Adrenals/Urinary Tract: Bilateral adrenal adenomas, both confirm measuring less than 10 Hounsfield units on the noncontrast CT, are again noted measuring 1.8 cm on the right, 1.5 cm on the left. The  bilateral renal cortex is unremarkable. There is no urinary stone or obstruction. The bladder is catheterized with small amount of air in the lumen. Stomach/Bowel: The stomach is more fluid distended than previously. Indwelling NGT abuts the anterior wall of the gastric body. Little if any change is seen in diffuse small bowel dilatation, maximum small bowel caliber is up to 4.4 cm, with high-grade obstruction felt to be present based on the difference in caliber between the dilated and the collapsed right lower quadrant segments. There is Abrea Henle transitional segment in the right lower quadrant at about the level of the iliac bifurcation posteriorly, best seen on 3: 59-62. Bowel gas and dense fluid continue to be seen in the mid to distal colon. There is increased stranding alongside the ascending and descending colon, unclear if this is due to colitis or dependent congestive changes/fluid overload. The ascending colon is relatively contracted. Vascular/Lymphatic: Trace aortic calcific plaque. No adenopathy is seen. Reproductive: No prostatomegaly. Other: There are mild mesenteric congestive features and development of mild ascites in the pelvis. There is minimal fluid in the pericolic gutters. There is no free air, free hemorrhage, bowel pneumatosis or abscess. Musculoskeletal: No acute or significant osseous findings. Review of the MIP images confirms the above findings. IMPRESSION: 1. No evidence of arterial dilatation or central emboli. The subsegmental and segmental arterial structures are largely not evaluated due to bolus timing. 2. Interval development of small to moderate  right and small left pleural effusions with adjacent atelectasis or consolidation in the lower lobes. 3. Either the gallbladder is tightly contracted around Amalia Edgecombe stone in the mid lumen or the patient has Prajna Vanderpool partially porcelain gallbladder. There is no wall thickening or edema. 4. Persistent high-grade distal small-bowel obstruction with an abrupt caliber change in the right lower quadrant. 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 No internal hernia is visible. 5. Increased stranding along side the ascending and descending colon, could be due to Yosselin Zoeller mild colitis or could be due to dependent congestive changes or fluid overload. There does appear to be increased congestive subcutaneous edema in the bilateral flanks. 6. Aortic and coronary atherosclerosis. 7. Mild hepatosplenomegaly and hepatic steatosis. 8. Mild ascites.  No free air. 9. Mild upper plate anterior wedge compression fracture of the T7 vertebral body again noted and additional redemonstrated potentially recent fractures in the anterolateral right sixth through ninth ribs. Electronically Signed   By: Almira Bar M.D.   On: 07/10/2022 02:05   CT ABDOMEN PELVIS W CONTRAST  Result Date: 07/10/2022 CLINICAL DATA:  Inpatient encounter for abdominal pain. Pulmonary embolus suspected with unknown D-dimer. EXAM: CT ANGIOGRAPHY CHEST CT ABDOMEN AND PELVIS WITH CONTRAST TECHNIQUE: Multidetector CT imaging of the chest was performed using the standard protocol during bolus administration of intravenous contrast. Multiplanar CT image reconstructions and MIPs were obtained to evaluate the vascular anatomy. Multidetector CT imaging of the abdomen and pelvis was performed using the standard protocol during bolus administration of intravenous contrast. RADIATION DOSE REDUCTION: This exam was performed according to the departmental dose-optimization program which includes automated exposure control, adjustment of the mA and/or kV according to patient size and/or  use of iterative reconstruction technique. CONTRAST:  OMNIPAQUE IOHEXOL 350 MG/ML SOLN COMPARISON:  The chest, abdomen and pelvis CT with IV contrast 07/02/2022 and the abdomen and pelvis CT no contrast 07/04/2022. No older cross-sectional imaging for comparison. FINDINGS: CTA CHEST FINDINGS Cardiovascular: Pulmonary arteries are normal caliber but are suboptimally enhanced distal to the main lobar arteries due  to bolus timing. Segmental and subsegmental arterial emboli are not excluded on this exam. There are no findings suspicious for acute right heart strain. The heart is borderline prominent. There is scattered three-vessel calcific CAD. There is no pericardial effusion. The pulmonary veins are decompressed. The thoracic aorta and great vessels are normal. Mediastinum/Nodes: The trachea is clear. The lower poles of the thyroid gland are unremarkable. Axillary spaces are clear. Mild superior mediastinal lipomatosis is again noted. There is no intrathoracic adenopathy. The esophagus is increasingly patulous. NGT is now seen in place with the tip abutting the far wall of the body of stomach. There is no esophageal thickening. Lungs/Pleura: Interval development noted Amabel Stmarie small to moderate right and small left bilateral layering pleural effusions. There is adjacent consolidation with air bronchograms in the lower lobes which could be simple compressive atelectasis or Raffi Milstein combination of atelectasis and pneumonic consolidation. There is posterior atelectasis in the right upper lobe. The more anterior lungs are clear. Central airways are widely patent. Musculoskeletal: No chest wall abnormality. Jolinda Pinkstaff mild upper plate anterior wedge compression fracture of T7 is again shown. No new abnormality of the thoracic cage is seen. There are chronic healed fracture deformities of some of the right anterior ribs but there are fracture deformities which may be more recent again noted involving the anterolateral sixth and seventh  ribs and the anterolateral eighth and ninth ribs. Review of the MIP images confirms the above findings. CT ABDOMEN and PELVIS FINDINGS Hepatobiliary: 20 cm in length mildly steatotic liver without mass. There is Shatoya Roets stone versus wall calcification in the mid gallbladder but no wall thickening or biliary dilatation. Spleen: Splenomegaly with splenic length 16.9 cm, unchanged. No splenic mass. Pancreas: No focal abnormality. Adrenals/Urinary Tract: Bilateral adrenal adenomas, both confirm measuring less than 10 Hounsfield units on the noncontrast CT, are again noted measuring 1.8 cm on the right, 1.5 cm on the left. The bilateral renal cortex is unremarkable. There is no urinary stone or obstruction. The bladder is catheterized with small amount of air in the lumen. Stomach/Bowel: The stomach is more fluid distended than previously. Indwelling NGT abuts the anterior wall of the gastric body. Little if any change is seen in diffuse small bowel dilatation, maximum small bowel caliber is up to 4.4 cm, with high-grade obstruction felt to be present based on the difference in caliber between the dilated and the collapsed right lower quadrant segments. There is Japji Kok transitional segment in the right lower quadrant at about the level of the iliac bifurcation posteriorly, best seen on 3: 59-62. Bowel gas and dense fluid continue to be seen in the mid to distal colon. There is increased stranding alongside the ascending and descending colon, unclear if this is due to colitis or dependent congestive changes/fluid overload. The ascending colon is relatively contracted. Vascular/Lymphatic: Trace aortic calcific plaque. No adenopathy is seen. Reproductive: No prostatomegaly. Other: There are mild mesenteric congestive features and development of mild ascites in the pelvis. There is minimal fluid in the pericolic gutters. There is no free air, free hemorrhage, bowel pneumatosis or abscess. Musculoskeletal: No acute or significant  osseous findings. Review of the MIP images confirms the above findings. IMPRESSION: 1. No evidence of arterial dilatation or central emboli. The subsegmental and segmental arterial structures are largely not evaluated due to bolus timing. 2. Interval development of small to moderate right and small left pleural effusions with adjacent atelectasis or consolidation in the lower lobes. 3. Either the gallbladder is tightly contracted around Mady Oubre stone  in the mid lumen or the patient has Elonda Giuliano partially porcelain gallbladder. There is no wall thickening or edema. 4. Persistent high-grade distal small-bowel obstruction with an abrupt caliber change in the right lower quadrant. 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 No internal hernia is visible. 5. Increased stranding along side the ascending and descending colon, could be due to Clevester Helzer mild colitis or could be due to dependent congestive changes or fluid overload. There does appear to be increased congestive subcutaneous edema in the bilateral flanks. 6. Aortic and coronary atherosclerosis. 7. Mild hepatosplenomegaly and hepatic steatosis. 8. Mild ascites.  No free air. 9. Mild upper plate anterior wedge compression fracture of the T7 vertebral body again noted and additional redemonstrated potentially recent fractures in the anterolateral right sixth through ninth ribs. Electronically Signed   By: Almira Bar M.D.   On: 07/10/2022 02:05   DG Abd 1 View  Result Date: 07/09/2022 CLINICAL DATA:  Check gastric catheter placement EXAM: ABDOMEN - 1 VIEW COMPARISON:  Film from the previous day. FINDINGS: Gastric catheter is noted within the stomach. Scattered large and small bowel gas is noted. Persistent small bowel dilatation is seen. IMPRESSION: Gastric catheter within the stomach Electronically Signed   By: Alcide Clever M.D.   On: 07/09/2022 22:43        Scheduled Meds:  Chlorhexidine Gluconate Cloth  6 each Topical Daily   cholecalciferol  2,000 Units Oral BID    enoxaparin (LOVENOX) injection  40 mg Subcutaneous Daily   insulin aspart  0-9 Units Subcutaneous Q4H   LORazepam  0-4 mg Intravenous Q12H   sodium chloride flush  10-40 mL Intracatheter Q12H   Continuous Infusions:  ampicillin-sulbactam (UNASYN) IV 3 g (07/11/22 1401)   dextrose 5% lactated ringers 125 mL/hr at 07/11/22 1052   lactated ringers     potassium chloride 10 mEq (07/11/22 1611)   TPN ADULT (ION)       LOS: 9 days    Time spent: over 30 min    Lacretia Nicks, MD [facility]   To contact the attending provider between 7A-7P or the covering provider during after hours 7P-7A, please log into the web site [email or web address] and access using universal [place] password for that web site. If you do not have the password, please call the hospital operator.  07/11/2022, 5:07 PM

## 2022-07-11 NOTE — Progress Notes (Signed)
Occupational Therapy Treatment Patient Details Name: Ryan Lynch MRN: [id number] DOB: [birth year] Today's Date: 07/11/2022   History of present illness 41 yo male admitted 07/02/22 following fall down the stairs with subsequent tibial plateua fx requiring 4 compartment fasciotomies and placement of ex fix. S/p ROIF tibial plateu fx 8/22. PMH includes anxiety, ACL repair, psoriasis and daily consumption of alcohol.   OT comments  Pt progressing this session with increased arousal and awareness to himself. Pt with poor awareness of night and day but was in a room with curtains drawn and very dark. Pt oriented to time of day by therapist and recalling at the end of session. Pt able to verbalize location and reason for admission. Pt expressing he could do better with therapy if removing his mitten for a few minutes and then we could reapply the mittens. Pt showing problem solving how to use the RW better and awareness to redon the mittens. RN made aware of volume output from NG and placement. Pt expressed feeling very "hot" and insisting on a wrap on the top of his head the entire session. Recommendatin remains CIR at this time as pt will be NWB LLE   Recommendations for follow up therapy are one component of a multi-disciplinary discharge planning process, led by the attending physician.  Recommendations may be updated based on patient status, additional functional criteria and insurance authorization.    Follow Up Recommendations  Acute inpatient rehab (3hours/day)    Assistance Recommended at Discharge Frequent or constant Supervision/Assistance  Patient can return home with the following  Two people to help with walking and/or transfers;Two people to help with bathing/dressing/bathroom;Assist for transportation   Equipment Recommendations  BSC/3in1;Wheelchair (measurements OT);Wheelchair cushion (measurements OT);Hospital bed    Recommendations for Other Services Rehab consult    Precautions /  Restrictions Precautions Precautions: Fall Precaution Comments: watch HR/O2, wrist restraints mittens Required Braces or Orthoses: Other Brace Knee Immobilizer - Left: Other (comment) (on when OOB able to d/c supine per ortho notes 8/28) Other Brace: hinged knee brace, unlocked Restrictions LLE Weight Bearing: Non weight bearing Other Position/Activity Restrictions: no ROM restrictions       Mobility Bed Mobility Overal bed mobility: Needs Assistance Bed Mobility: Rolling, Supine to Sit, Sit to Supine Rolling: Min assist   Supine to sit: Min assist Sit to supine: Max assist   General bed mobility comments: pt requires (A) with lines/ leads/ tubing to progress toward EOB. pt verbalizing "i dont want to pull" showing awareness to cords and potential to dislodge. Pt returnign to supine requires physical (A) of LLE. pt in bed able to bridge with R LE    Transfers Overall transfer level: Needs assistance Equipment used: Rolling walker (2 wheels) Transfers: Sit to/from Stand Sit to Stand: +2 physical assistance, Min assist           General transfer comment: pt sit<>Stand x3 during session. pt was able to side hope toward L with total +2 max (A) with RW scooting from therapists. Pt able to sustain static standing for ~ 45 second-47minute then verbalized need to sit     Balance Overall balance assessment: Needs assistance Sitting-balance support: Bilateral upper extremity supported, Feet supported Sitting balance-Leahy Scale: Fair     Standing balance support: Bilateral upper extremity supported, During functional activity, Reliant on assistive device for balance Standing balance-Leahy Scale: Poor  ADL either performed or assessed with clinical judgement   ADL Overall ADL's : Needs assistance/impaired Eating/Feeding: NPO Eating/Feeding Details (indicate cue type and reason): noted to have roughly 1 inch from the nose marker on the  tubing has descended from the tip of patient nose. Rn made aware of concern that tubing has externally progressed. Pt also noted to have a few clearing of throats and cough. RN voiced she will check placement                 Lower Body Dressing: Total assistance Lower Body Dressing Details (indicate cue type and reason): total (A) to don bledsoe brace               General ADL Comments: pt progressed to EOB and sit<>Stand with RW. pt with good return demo of NWB LLE. pt verbalized when needing rest break adequately    Extremity/Trunk Assessment Upper Extremity Assessment Upper Extremity Assessment: Overall WFL for tasks assessed   Lower Extremity Assessment Lower Extremity Assessment: Defer to PT evaluation        Vision       Perception     Praxis      Cognition Arousal/Alertness: Awake/alert Behavior During Therapy: Flat affect Overall Cognitive Status: Impaired/Different from baseline                   Orientation Level: Disoriented to, Time Current Attention Level: Sustained Memory: Decreased recall of precautions, Decreased short-term memory Following Commands: Follows one step commands consistently Safety/Judgement: Decreased awareness of safety Awareness: Intellectual Problem Solving: Slow processing General Comments: pt states " you can take these off and then we can put them back on when we are done" pt reports it is night in a dark room. OT leaving the window open to help with time orientation. pt expressing understanding of some of the injuries and falling downstairs without cues.        Exercises      Shoulder Instructions       General Comments 4 L 02 [place] , NG tubing with half canister of output with transfer    Pertinent Vitals/ Pain       Pain Assessment Pain Assessment: No/denies pain  Home Living                                          Prior Functioning/Environment              Frequency  Min  2X/week        Progress Toward Goals  OT Goals(current goals can now be found in the care plan section)  Progress towards OT goals: Progressing toward goals  Acute Rehab OT Goals Patient Stated Goal: to get to take mitten off for little bit OT Goal Formulation: With patient Time For Goal Achievement: 07/17/22 Potential to Achieve Goals: Good ADL Goals Pt Will Perform Lower Body Bathing: with set-up;with adaptive equipment Pt Will Perform Lower Body Dressing: with set-up;with adaptive equipment Pt Will Transfer to Toilet: with set-up;bedside commode Pt Will Perform Toileting - Clothing Manipulation and hygiene: with set-up;sit to/from stand  Plan Discharge plan remains appropriate    Co-evaluation    PT/OT/SLP Co-Evaluation/Treatment: Yes Reason for Co-Treatment: To address functional/ADL transfers;For patient/therapist safety   OT goals addressed during session: ADL's and self-care;Proper use of Adaptive equipment and DME;Strengthening/ROM      AM-PAC OT "6 Clicks"  Daily Activity     Outcome Measure   Help from another person eating meals?: A Little Help from another person taking care of personal grooming?: A Little Help from another person toileting, which includes using toliet, bedpan, or urinal?: A Lot Help from another person bathing (including washing, rinsing, drying)?: A Lot Help from another person to put on and taking off regular upper body clothing?: A Little Help from another person to put on and taking off regular lower body clothing?: A Lot 6 Click Score: 15    End of Session Equipment Utilized During Treatment: Gait belt;Rolling walker (2 wheels);Oxygen;Left knee immobilizer  OT Visit Diagnosis: Unsteadiness on feet (R26.81);Other abnormalities of gait and mobility (R26.89);Other symptoms and signs involving cognitive function   Activity Tolerance Patient tolerated treatment well   Patient Left in bed;with call bell/phone within reach;with bed alarm  set;with restraints reapplied   Nurse Communication Mobility status;Precautions        Time: 3500-9381 OT Time Calculation (min): 26 min  Charges: OT General Charges $OT Visit: 1 Visit OT Treatments $Therapeutic Activity: 8-22 mins   Brynn, OTR/L  Acute Rehabilitation Services Office: [phone] .   Mateo Flow 07/11/2022, 1:42 PM

## 2022-07-11 NOTE — Progress Notes (Addendum)
PHARMACY - TOTAL PARENTERAL NUTRITION CONSULT NOTE   Indication: Small bowel obstruction  Patient Measurements: Height: 5\' 11"  (180.3 cm) Weight: 91.6 kg (201 lb 15.1 oz) IBW/kg (Calculated) : 75.3 TPN AdjBW (KG): 91.6 Body mass index is 28.17 kg/m. Usual Weight: 190 lbs  Assessment: 41 yo male presented on 07/02/2022 for fall found to have tibial plateau fracture with compartment syndrome s/p reduction and fasciotomy on 8/19 with ortho. Patient now with concern of ileus vs. SBO. CT concerning for SBO though patient have multiple Bms. Surgery consulted. Pharmacy consulted to manage TPN.   Glucose / Insulin: no hx of BM. CBGs controlled this admission with no SSI. Has received 3 doses of dexamethasone (8/19, 8/22, 8/24).   Electrolytes: Na 145, K 3.6 (on MIVF containing 60 mEq KCl), Cl 117, CoCa 9.4, Phos 2.9, Mg 2.4. Other electrolytes wnl.  Renal: Scr at baseline. BUN 5.  Hepatic: LFTs wnl. TG 155. Albumin 2.1.  Intake / Output; MIVF: 0.5  ml/kg/hr UOP, 9/24 NGT output, D5 LR with 20 mEq/L Kcl at 125 ml/hr - receiving since ~8/25.  GI Imaging: - 8/28 xray: slight worsening small bowel dilatation GI Surgeries / Procedures: none since TPN start  Central access: 8/27 TPN start date: 8/28  Nutritional Goals: Goal TPN rate is 100 mL/hr (provides 130 g of protein and 2486 kcals per day)  RD Assessment: Estimated Needs Total Energy Estimated Needs: 2400-2600 Total Protein Estimated Needs: 120-135g Total Fluid Estimated Needs: >/=2L  Current Nutrition:  NPO and TPN  Plan:  Start TPN at 50 mL/hr at 1800 to provide 65g of protein and 1243 kcal Electrolytes in TPN: Na 62mEq/L, K 5mEq/L (~72 mEq of K), Ca 5mEq/L, Mg 40mEq/L, and Phos 55mmol/L. Cl:Ac 1:2. Will watch Na and Mg closely for need to remove from TPN.  KCL 10 mEq x 6 (already ordered per surgery) Add standard MVI and trace elements to TPN Add folic acid 1mg  to TPN Discontinue oral MV and zinc till able to take oral  medications Add thiamine 250mg  daily x 5 days (last day 8/31) then 100mg  daily starting tomorrow since IV thiamine already given today Initiate Sensitive q6h SSI and adjust as needed  Stop D5LR with KCL with K replacement ordered and change to D5LR at 147ml/hr then change to LR 75 mL/hr at 1800 with new TPN Monitor TPN labs on Mon/Thurs, repeat electrolytes tomorrow  , PharmD, BCPS Clinical Pharmacist 07/11/2022 7:50 AM

## 2022-07-11 NOTE — Assessment & Plan Note (Signed)
Mild upper plate anterior wedge compression fx of T7 vertebral body Symptomatic management, will follow

## 2022-07-11 NOTE — Progress Notes (Signed)
Nutrition Follow-up  DOCUMENTATION CODES:   Not applicable  INTERVENTION:   TPN initiation and advancement to goal to meet nutrition needs  NUTRITION DIAGNOSIS:   Increased nutrient needs related to acute illness, post-op healing as evidenced by estimated needs. Ongoing  GOAL:   Patient will meet greater than or equal to 90% of their needs Progressing   MONITOR:   PO intake, Supplement acceptance, Labs, Weight trends, I & O's, Skin  REASON FOR ASSESSMENT:   Consult Assessment of nutrition requirement/status  ASSESSMENT:   Pt admitted after a fall leading to L bicondylar tibial plateau fracture s/p external fixation and acute compartment syndrome s/p 4 compartment fasciotomies. PMH significant for alcohol use  Spoke with TPN Pharmacist; TPN starting today for persistent ileus.  Pt with NGT to suction; 3 L removed in 24 hrs but pt taking some liquids. Now NPO with ice chips.    8/22: s/p ORIF L tibial plateau 8/24: s/p I&D extremity with secondary closure of wound 8/28: started TPN  Medications reviewed and include: Vitamin D3, SSI, lactulose K runs x 6   Labs: K 3.6; Ammonia 56  UOP: 1000 ml x24 hours NG: 3000 ml x 24 hrs    Diet Order:   Diet Order             Diet NPO time specified Except for: Ice Chips  Diet effective now                   EDUCATION NEEDS:   No education needs have been identified at this time  Skin:  Skin Assessment: Skin Integrity Issues: Skin Integrity Issues:: Incisions Incisions: L leg (closed)  Last BM:  8/28  Height:   Ht Readings from Last 1 Encounters:  07/03/22 5\' 11"  (1.803 m)    Weight:   Wt Readings from Last 1 Encounters:  07/03/22 91.6 kg   BMI:  Body mass index is 28.17 kg/m.  Estimated Nutritional Needs:   Kcal:  2400-2600  Protein:  120-135g  Fluid:  >/=2L  Ahmiyah Coil P., RD, LDN, CNSC See AMiON for contact information

## 2022-07-11 NOTE — Progress Notes (Signed)
Orthopaedic Trauma Service Progress Note  Patient ID: Ryan Lynch MRN: [id number] DOB/AGE: Jun 14, 1981 41 y.o.  Subjective:  Remains in progressive care No acute ortho issues   Remains confused but alert. Able to carry on a little bit of a conversation then pt starts to not make sense    ROS As above  Objective:   VITALS:   Vitals:   07/10/22 1043 07/10/22 1512 07/10/22 2050 07/11/22 0748  BP: 132/72 114/75 (!) 146/72 (!) 148/70  Pulse: 100 92 90 92  Resp: 19 19 (!) 21 19  Temp: 100.1 F (37.8 C) 99.4 F (37.4 C) 99.1 F (37.3 C) 98.4 F (36.9 C)  TempSrc: Axillary Oral Oral   SpO2: 98% 98% 99% 100%  Weight:      Height:        Estimated body mass index is 28.17 kg/m as calculated from the following:   Height as of this encounter: 5\' 11"  (1.803 m).   Weight as of this encounter: 91.6 kg.   Intake/Output      08/27 0701 08/28 0700 08/28 0701 08/29 0700   I.V. (mL/kg) 2083.5 (22.7)    IV Piggyback 400    Total Intake(mL/kg) 2483.5 (27.1)    Urine (mL/kg/hr) 1000 (0.5)    Emesis/NG output 3000    Total Output 4000    Net -1516.5         Stool Occurrence  1 x     LABS  Results for orders placed or performed during the hospital encounter of 07/02/22 (from the past 24 hour(s))  Ammonia     Status: Abnormal   Collection Time: 07/10/22  2:42 PM  Result Value Ref Range   Ammonia 49 (H) 9 - 35 umol/L  Brain natriuretic peptide     Status: None   Collection Time: 07/10/22  2:47 PM  Result Value Ref Range   B Natriuretic Peptide 12.4 0.0 - 100.0 pg/mL  Procalcitonin     Status: None   Collection Time: 07/11/22  3:01 AM  Result Value Ref Range   Procalcitonin <0.10 ng/mL  CBC with Differential/Platelet     Status: Abnormal   Collection Time: 07/11/22  3:01 AM  Result Value Ref Range   WBC 4.1 4.0 - 10.5 K/uL   RBC 2.65 (L) 4.22 - 5.81 MIL/uL   Hemoglobin 7.4 (L) 13.0 - 17.0 g/dL    HCT 07/13/22 (L) 26.3 - 52.0 %   MCV 92.8 80.0 - 100.0 fL   MCH 27.9 26.0 - 34.0 pg   MCHC 30.1 30.0 - 36.0 g/dL   RDW 33.5 (H) 45.6 - 25.6 %   Platelets 134 (L) 150 - 400 K/uL   nRBC 0.0 0.0 - 0.2 %   Neutrophils Relative % 64 %   Neutro Abs 2.7 1.7 - 7.7 K/uL   Lymphocytes Relative 23 %   Lymphs Abs 1.0 0.7 - 4.0 K/uL   Monocytes Relative 9 %   Monocytes Absolute 0.4 0.1 - 1.0 K/uL   Eosinophils Relative 2 %   Eosinophils Absolute 0.1 0.0 - 0.5 K/uL   Basophils Relative 1 %   Basophils Absolute 0.0 0.0 - 0.1 K/uL   Immature Granulocytes 1 %   Abs Immature Granulocytes 0.03 0.00 - 0.07 K/uL  Comprehensive metabolic panel     Status: Abnormal  Collection Time: 07/11/22  3:01 AM  Result Value Ref Range   Sodium 145 135 - 145 mmol/L   Potassium 3.6 3.5 - 5.1 mmol/L   Chloride 117 (H) 98 - 111 mmol/L   CO2 23 22 - 32 mmol/L   Glucose, Bld 113 (H) 70 - 99 mg/dL   BUN 5 (L) 6 - 20 mg/dL   Creatinine, Ser 7.84 0.61 - 1.24 mg/dL   Calcium 7.9 (L) 8.9 - 10.3 mg/dL   Total Protein 5.2 (L) 6.5 - 8.1 g/dL   Albumin 2.1 (L) 3.5 - 5.0 g/dL   AST 23 15 - 41 U/L   ALT 26 0 - 44 U/L   Alkaline Phosphatase 123 38 - 126 U/L   Total Bilirubin 1.1 0.3 - 1.2 mg/dL   GFR, Estimated >69 >62 mL/min   Anion gap 5 5 - 15  Magnesium     Status: None   Collection Time: 07/11/22  3:01 AM  Result Value Ref Range   Magnesium 2.4 1.7 - 2.4 mg/dL  Phosphorus     Status: None   Collection Time: 07/11/22  3:01 AM  Result Value Ref Range   Phosphorus 2.9 2.5 - 4.6 mg/dL  Iron and TIBC     Status: Abnormal   Collection Time: 07/11/22  3:01 AM  Result Value Ref Range   Iron 18 (L) 45 - 182 ug/dL   TIBC 952 841 - 324 ug/dL   Saturation Ratios 5 (L) 17.9 - 39.5 %   UIBC 314 ug/dL  Ferritin     Status: None   Collection Time: 07/11/22  3:01 AM  Result Value Ref Range   Ferritin 26 24 - 336 ng/mL  Folate     Status: None   Collection Time: 07/11/22  3:01 AM  Result Value Ref Range   Folate 13.7 >5.9  ng/mL  Vitamin B12     Status: None   Collection Time: 07/11/22  3:01 AM  Result Value Ref Range   Vitamin B-12 528 180 - 914 pg/mL  Ammonia     Status: Abnormal   Collection Time: 07/11/22  3:01 AM  Result Value Ref Range   Ammonia 56 (H) 9 - 35 umol/L  Triglycerides     Status: Abnormal   Collection Time: 07/11/22  3:01 AM  Result Value Ref Range   Triglycerides 155 (H) <150 mg/dL     PHYSICAL EXAM:   Gen: in bed, NAD, sleeping, alert but confused.  Soft mitts in place   Lungs: unlabored Ext:       Left lower Extremity              dressing clean, dry and intact    Dressings removed   All incisions look great   No active drainage   No signs of infection   Hinged brace is in place and fitting well              Ext warm              Mild swelling to foot and ankle             + DP pulse             continues to have improved motor function with ankle extension and flexion              Great toe extension intact             DPN, SPN sensation nearly normal per  his report             TN sensation appears symmetric to contralateral side  Heel looks good, no pressure sores     Assessment/Plan: 4 Days Post-Op   Principal Problem:   Observation after surgery Active Problems:   Tibial plateau fracture, left   Alcohol use   Transaminitis   Compartment syndrome of left lower extremity, initial encounter ([name])   Adrenal nodule ([name])   Compression fracture of T7 vertebra ([name])   Thrombocytopenia ([name])   Anxiety   Traumatic rhabdomyolysis ([name])   Ileus ([name])   Acute metabolic encephalopathy   Alcohol withdrawal ([name])   Acute anemia   Psoriasis   AKI (acute kidney injury) ([name])   Sepsis ([name])   Acute urinary retention   Prolonged QT interval   POD/HD#: 40  41 y/o male, alcoholic, s/p fall with closed L bicondylar tibial plateau fracture and acute compartment syndrome left leg    -fall   - L bicondylar tibial plateau fracture s/p external fixation and acute  compartment syndrome s/p 4 compartment fasciotomies              NWB L leg             Ice and elevate              ok to leave hinged brace off when in bed   Brace on when mobilizing with therapy   Placed pt in zero knee bone foam               Ortho surgeries completed             Float heel off bed                          Pressure relief dressing                Unrestricted ROM L knee                Therapies     PT- please teach HEP for L knee ROM- AROM, PROM. Prone exercises as well. No ROM restrictions.  Quad sets, SLR, LAQ, SAQ, heel slides, stretching, prone flexion and extension   Ankle theraband program, heel cord stretching, toe towel curls, etc   No pillows under bend of knee when at rest, ok to place under heel to help work on extension. Can also use zero knee bone foam if available   Hinged knee brace on at all times, unlocked.  Ok to work on [employer] without brace with therapist supervision.  Brace back on after ROM session if removed   - EtOH abuse/withdrawal              CIWA             SW consulted                active withdrawals                           Continue care in 4NP   - T7 compression fracture              No pain              Do not think this is acute              No further intervention   - ileus  Per general surgery and medicine     - Rhabdomyolysis              resolved    - transaminitis              Likely multifactorial---> etoh, rhabdo                          Improved    - thrombocytopenia              resolved    - Pain management:             Multimodal    - ABL anemia/Hemodynamics             monitor              stable               - Medical issues              Per medicine               - DVT/PE prophylaxis:             Lovenox    - ID:              Periop abx completed    - Metabolic Bone Disease:             Vitamin d deficiency                          Supplement     - Impediments to fracture  healing:             EtOH abuse             Vitamin d deficiency    - Dispo:             Therapies             Continue with inpatient care               Mearl Latin, PA-C [phone] (C) 07/11/2022, 11:39 AM  [facility] [address] [place] [address] [phone] [phone] (F)    After 5pm and on the weekends please log on to Amion, go to orthopaedics and the look under the [facility] Call for the provider(s) on call. You can also call our office at [phone] and then follow the prompts to be connected to the call team.   Patient ID: Ryan Lynch, male   DOB: 1981/04/15, 41 y.o.   MRN: [id number]

## 2022-07-11 NOTE — Progress Notes (Addendum)
General Surgery Follow Up Note  Subjective:    Overnight Issues: eating a lot of ice.  Still moving his bowels.  Passed SBO protocol  Objective:  Vital signs for last 24 hours: Temp:  [98.4 F (36.9 C)-100.1 F (37.8 C)] 98.4 F (36.9 C) (08/28 0748) Pulse Rate:  [90-100] 92 (08/28 0748) Resp:  [19-21] 19 (08/28 0748) BP: (114-148)/(70-75) 148/70 (08/28 0748) SpO2:  [98 %-100 %] 100 % (08/28 0748)   Intake/Output from previous day: 08/27 0701 - 08/28 0700 In: 2483.5 [I.V.:2083.5; IV Piggyback:400] Out: 4000 [Urine:1000; Emesis/NG output:3000]  Intake/Output this shift: No intake/output data recorded.  Physical Exam:  Gen: comfortable, no distress Pulm: unlabored breathing Abd: soft, NT, slightly distention, NGT with 100cc currently, but cannister just changed.  3L documented yesterday, but eating a lot ice. (Already had 2 cups this am)    Results for orders placed or performed during the hospital encounter of 07/02/22 (from the past 24 hour(s))  Ammonia     Status: Abnormal   Collection Time: 07/10/22  2:42 PM  Result Value Ref Range   Ammonia 49 (H) 9 - 35 umol/L  Brain natriuretic peptide     Status: None   Collection Time: 07/10/22  2:47 PM  Result Value Ref Range   B Natriuretic Peptide 12.4 0.0 - 100.0 pg/mL  Procalcitonin     Status: None   Collection Time: 07/11/22  3:01 AM  Result Value Ref Range   Procalcitonin <0.10 ng/mL  CBC with Differential/Platelet     Status: Abnormal   Collection Time: 07/11/22  3:01 AM  Result Value Ref Range   WBC 4.1 4.0 - 10.5 K/uL   RBC 2.65 (L) 4.22 - 5.81 MIL/uL   Hemoglobin 7.4 (L) 13.0 - 17.0 g/dL   HCT 84.1 (L) 66.0 - 63.0 %   MCV 92.8 80.0 - 100.0 fL   MCH 27.9 26.0 - 34.0 pg   MCHC 30.1 30.0 - 36.0 g/dL   RDW 16.0 (H) 10.9 - 32.3 %   Platelets 134 (L) 150 - 400 K/uL   nRBC 0.0 0.0 - 0.2 %   Neutrophils Relative % 64 %   Neutro Abs 2.7 1.7 - 7.7 K/uL   Lymphocytes Relative 23 %   Lymphs Abs 1.0 0.7 - 4.0  K/uL   Monocytes Relative 9 %   Monocytes Absolute 0.4 0.1 - 1.0 K/uL   Eosinophils Relative 2 %   Eosinophils Absolute 0.1 0.0 - 0.5 K/uL   Basophils Relative 1 %   Basophils Absolute 0.0 0.0 - 0.1 K/uL   Immature Granulocytes 1 %   Abs Immature Granulocytes 0.03 0.00 - 0.07 K/uL  Comprehensive metabolic panel     Status: Abnormal   Collection Time: 07/11/22  3:01 AM  Result Value Ref Range   Sodium 145 135 - 145 mmol/L   Potassium 3.6 3.5 - 5.1 mmol/L   Chloride 117 (H) 98 - 111 mmol/L   CO2 23 22 - 32 mmol/L   Glucose, Bld 113 (H) 70 - 99 mg/dL   BUN 5 (L) 6 - 20 mg/dL   Creatinine, Ser 5.57 0.61 - 1.24 mg/dL   Calcium 7.9 (L) 8.9 - 10.3 mg/dL   Total Protein 5.2 (L) 6.5 - 8.1 g/dL   Albumin 2.1 (L) 3.5 - 5.0 g/dL   AST 23 15 - 41 U/L   ALT 26 0 - 44 U/L   Alkaline Phosphatase 123 38 - 126 U/L   Total Bilirubin 1.1 0.3 -  1.2 mg/dL   GFR, Estimated >23 >30 mL/min   Anion gap 5 5 - 15  Magnesium     Status: None   Collection Time: 07/11/22  3:01 AM  Result Value Ref Range   Magnesium 2.4 1.7 - 2.4 mg/dL  Phosphorus     Status: None   Collection Time: 07/11/22  3:01 AM  Result Value Ref Range   Phosphorus 2.9 2.5 - 4.6 mg/dL  Iron and TIBC     Status: Abnormal   Collection Time: 07/11/22  3:01 AM  Result Value Ref Range   Iron 18 (L) 45 - 182 ug/dL   TIBC 076 226 - 333 ug/dL   Saturation Ratios 5 (L) 17.9 - 39.5 %   UIBC 314 ug/dL  Ferritin     Status: None   Collection Time: 07/11/22  3:01 AM  Result Value Ref Range   Ferritin 26 24 - 336 ng/mL  Folate     Status: None   Collection Time: 07/11/22  3:01 AM  Result Value Ref Range   Folate 13.7 >5.9 ng/mL  Vitamin B12     Status: None   Collection Time: 07/11/22  3:01 AM  Result Value Ref Range   Vitamin B-12 528 180 - 914 pg/mL  Ammonia     Status: Abnormal   Collection Time: 07/11/22  3:01 AM  Result Value Ref Range   Ammonia 56 (H) 9 - 35 umol/L  Triglycerides     Status: Abnormal   Collection Time:  07/11/22  3:01 AM  Result Value Ref Range   Triglycerides 155 (H) <150 mg/dL    Assessment & Plan: The plan of care was discussed with the bedside nurse for the day, who is in agreement with this plan and no additional concerns were raised.   Present on Admission:  Compartment syndrome of left lower extremity, initial encounter ([name])    LOS: 9 days   Additional comments:I reviewed the patient's new clinical lab test results.   and I reviewed the patients new imaging test results.   Discussed patient with RN, reviewed notes by ortho, medicine   Ileus -passed SBO protocol with contrast in his colon and rectum. -cont NGT for today, but limit ice chips so we can really follow how much output he is having.  If this trends down by tomorrow, may start trying to clamp and see how he does -mobilize as able -mag> 2, K >4.  Will give some K today  FEN - NGT/NPO/IVFs/K ID - none currently needed from our standpoint, unasyn per medicine VTE - lovenox  Status post fall with tibial plateau fracture and compartment syndrome Status post fasciotomy and Ex-Fix EtOH abuse Transaminitis - resolved Thrombocytopenia Acute blood loss anemia-stable Acute kidney injury-resolved Elevated total bilirubin-stable at 1.9 Elevated alkaline phosphatase level Bilateral adrenal nodules-agree with outpatient follow-up Compression fracture of T7   Letha Cape, PA-C Please use [email or web address] to contact on call provider  07/11/2022

## 2022-07-11 NOTE — Progress Notes (Signed)
Physical Therapy Treatment Patient Details Name: Ryan Lynch MRN: [id number] DOB: November 22, 1980 Today's Date: 07/11/2022   History of Present Illness 41 yo male admitted 07/02/22 following fall down the stairs with subsequent tibial plateua fx requiring 4 compartment fasciotomies and placement of ex fix. S/p ROIF tibial plateu fx 8/22. s/p I& D LLE and second closure of wound 07/07/22.  PMH includes anxiety, ACL repair, psoriasis and daily consumption of alcohol.    PT Comments    Patient progressing well towards PT goals. Session focused on standing and hopping as well as cognition. Requires Min A for bed mobility and Min A of 2 for standing from EOB with cues for technique/safety. Able to hop along side bed with Min A of 2 for balance and to manage RW. Able to maintain NWB LLE during activity. Cognition seems to be improving. Continues to have deficits relating to time, awareness, memory, safety and problem solving. Continues to be a great AIR candidate. Will follow.     Recommendations for follow up therapy are one component of a multi-disciplinary discharge planning process, led by the attending physician.  Recommendations may be updated based on patient status, additional functional criteria and insurance authorization.  Follow Up Recommendations  Acute inpatient rehab (3hours/day)     Assistance Recommended at Discharge Frequent or constant Supervision/Assistance  Patient can return home with the following A little help with walking and/or transfers;A little help with bathing/dressing/bathroom;Assistance with cooking/housework;Assist for transportation;Help with stairs or ramp for entrance;Direct supervision/assist for medications management;Direct supervision/assist for financial management   Equipment Recommendations  Rolling walker (2 wheels);BSC/3in1;Wheelchair (measurements PT);Wheelchair cushion (measurements PT)    Recommendations for Other Services       Precautions /  Restrictions Precautions Precautions: Fall Precaution Comments: watch HR/O2, wrist restraints mittens Required Braces or Orthoses: Other Brace Knee Immobilizer - Left: Other (comment) Other Brace: hinged knee brace, unlocked Restrictions Weight Bearing Restrictions: Yes LLE Weight Bearing: Non weight bearing Other Position/Activity Restrictions: no ROM restrictions     Mobility  Bed Mobility Overal bed mobility: Needs Assistance Bed Mobility: Rolling, Supine to Sit, Sit to Supine Rolling: Min assist   Supine to sit: Min assist, HOB elevated Sit to supine: Min assist   General bed mobility comments: pt requires (A) with lines/ leads/ tubing to progress toward EOB. pt verbalizing "i dont want to pull" showing awareness to cords and potential to dislodge. Pt returnign to supine requires physical (A) of LLE. pt in bed able to bridge with R LE    Transfers Overall transfer level: Needs assistance Equipment used: Rolling walker (2 wheels) Transfers: Sit to/from Stand Sit to Stand: +2 physical assistance, Min assist           General transfer comment: Stood from EOB x3. Cues for hand placement/technique, Able to maintain NWB LLE    Ambulation/Gait Ambulation/Gait assistance: Min assist, +2 physical assistance, +2 safety/equipment Gait Distance (Feet): 4 Feet Assistive device: Rolling walker (2 wheels)         General Gait Details: Able to hop along side bed with assist for balance, RW management and to maintain NWB LLE, fatgiues.   Stairs             Wheelchair Mobility    Modified Rankin (Stroke Patients Only)       Balance Overall balance assessment: Needs assistance Sitting-balance support: Feet supported, Bilateral upper extremity supported Sitting balance-Leahy Scale: Fair Sitting balance - Comments: Prefers UE support   Standing balance support: During functional activity, Bilateral upper  extremity supported, Reliant on assistive device for  balance Standing balance-Leahy Scale: Poor Standing balance comment: Requires external support and UE support for standing, maintains NWB LLE                            Cognition Arousal/Alertness: Awake/alert Behavior During Therapy: Flat affect Overall Cognitive Status: Impaired/Different from baseline Area of Impairment: Safety/judgement, Following commands, Attention, Awareness, Memory, Problem solving                 Orientation Level: Disoriented to, Time Current Attention Level: Sustained Memory: Decreased recall of precautions, Decreased short-term memory Following Commands: Follows one step commands consistently Safety/Judgement: Decreased awareness of safety Awareness: Intellectual Problem Solving: Slow processing General Comments: pt states " you can take these off and then we can put them back on when we are done" pt reports it is night in a dark room. Leaving the window open to help with time orientation. pt expressing understanding of some of the injuries and falling downstairs without cues. IMproved awareness.        Exercises General Exercises - Lower Extremity Ankle Circles/Pumps: AROM, 10 reps, Supine, Both Quad Sets: AROM, Left, 10 reps, Supine Heel Slides: AAROM, Left, Supine, 5 reps    General Comments General comments (skin integrity, edema, etc.): 4 L 02 [place] , NG tubing with half canister of output with transfer      Pertinent Vitals/Pain Pain Assessment Pain Assessment: No/denies pain    Home Living                          Prior Function            PT Goals (current goals can now be found in the care plan section) Progress towards PT goals: Progressing toward goals    Frequency    Min 5X/week      PT Plan Current plan remains appropriate    Co-evaluation PT/OT/SLP Co-Evaluation/Treatment: Yes Reason for Co-Treatment: For patient/therapist safety;To address functional/ADL transfers;Necessary to address  cognition/behavior during functional activity PT goals addressed during session: Mobility/safety with mobility;Balance OT goals addressed during session: ADL's and self-care;Proper use of Adaptive equipment and DME;Strengthening/ROM      AM-PAC PT "6 Clicks" Mobility   Outcome Measure  Help needed turning from your back to your side while in a flat bed without using bedrails?: A Little Help needed moving from lying on your back to sitting on the side of a flat bed without using bedrails?: A Little Help needed moving to and from a bed to a chair (including a wheelchair)?: A Little Help needed standing up from a chair using your arms (e.g., wheelchair or bedside chair)?: A Little Help needed to walk in hospital room?: Total Help needed climbing 3-5 steps with a railing? : Total 6 Click Score: 14    End of Session Equipment Utilized During Treatment: Oxygen;Gait belt Activity Tolerance: Patient tolerated treatment well Patient left: in bed;with call bell/phone within reach;with bed alarm set;with restraints reapplied Nurse Communication: Mobility status PT Visit Diagnosis: Unsteadiness on feet (R26.81);Difficulty in walking, not elsewhere classified (R26.2)     Time: 8469-6295 PT Time Calculation (min) (ACUTE ONLY): 24 min  Charges:  $Therapeutic Activity: 8-22 mins                     Vale Haven, PT, DPT Acute Rehabilitation Services Secure chat preferred Office [phone]  Blake Divine A Daveon Arpino 07/11/2022, 2:02 PM

## 2022-07-11 NOTE — Assessment & Plan Note (Signed)
Right 6-9 rib fx Pulmonary toilet, pain control

## 2022-07-11 NOTE — Progress Notes (Signed)
? ?  Inpatient Rehab Admissions Coordinator : ? ?Per therapy recommendations, patient was screened for CIR candidacy by Alydia Gosser RN MSN.  At this time patient appears to be a potential candidate for CIR. I will place a rehab consult per protocol for full assessment. Please call me with any questions. ? ?Jeremi Losito RN MSN ?Admissions Coordinator ?[phone] ?  ?

## 2022-07-12 DIAGNOSIS — Z4889 Encounter for other specified surgical aftercare: Secondary | ICD-10-CM | POA: Diagnosis not present

## 2022-07-12 LAB — HEMOGLOBIN AND HEMATOCRIT, BLOOD
HCT: 23.1 % — ABNORMAL LOW (ref 39.0–52.0)
Hemoglobin: 7.4 g/dL — ABNORMAL LOW (ref 13.0–17.0)

## 2022-07-12 LAB — CBC WITH DIFFERENTIAL/PLATELET
Abs Immature Granulocytes: 0.17 10*3/uL — ABNORMAL HIGH (ref 0.00–0.07)
Basophils Absolute: 0 10*3/uL (ref 0.0–0.1)
Basophils Relative: 1 %
Eosinophils Absolute: 0.1 10*3/uL (ref 0.0–0.5)
Eosinophils Relative: 2 %
HCT: 22.8 % — ABNORMAL LOW (ref 39.0–52.0)
Hemoglobin: 7 g/dL — ABNORMAL LOW (ref 13.0–17.0)
Immature Granulocytes: 4 %
Lymphocytes Relative: 20 %
Lymphs Abs: 0.8 10*3/uL (ref 0.7–4.0)
MCH: 28.1 pg (ref 26.0–34.0)
MCHC: 30.7 g/dL (ref 30.0–36.0)
MCV: 91.6 fL (ref 80.0–100.0)
Monocytes Absolute: 0.3 10*3/uL (ref 0.1–1.0)
Monocytes Relative: 7 %
Neutro Abs: 2.6 10*3/uL (ref 1.7–7.7)
Neutrophils Relative %: 66 %
Platelets: 116 10*3/uL — ABNORMAL LOW (ref 150–400)
RBC: 2.49 MIL/uL — ABNORMAL LOW (ref 4.22–5.81)
RDW: 17 % — ABNORMAL HIGH (ref 11.5–15.5)
WBC: 3.9 10*3/uL — ABNORMAL LOW (ref 4.0–10.5)
nRBC: 0 % (ref 0.0–0.2)

## 2022-07-12 LAB — COMPREHENSIVE METABOLIC PANEL
ALT: 20 U/L (ref 0–44)
AST: 26 U/L (ref 15–41)
Albumin: 1.9 g/dL — ABNORMAL LOW (ref 3.5–5.0)
Alkaline Phosphatase: 150 U/L — ABNORMAL HIGH (ref 38–126)
Anion gap: 4 — ABNORMAL LOW (ref 5–15)
BUN: 5 mg/dL — ABNORMAL LOW (ref 6–20)
CO2: 23 mmol/L (ref 22–32)
Calcium: 7.6 mg/dL — ABNORMAL LOW (ref 8.9–10.3)
Chloride: 116 mmol/L — ABNORMAL HIGH (ref 98–111)
Creatinine, Ser: 0.65 mg/dL (ref 0.61–1.24)
GFR, Estimated: >60 mL/min (ref >60)
Glucose, Bld: 119 mg/dL — ABNORMAL HIGH (ref 70–99)
Potassium: 3.9 mmol/L (ref 3.5–5.1)
Sodium: 143 mmol/L (ref 135–145)
Total Bilirubin: 1.4 mg/dL — ABNORMAL HIGH (ref 0.3–1.2)
Total Protein: 4.6 g/dL — ABNORMAL LOW (ref 6.5–8.1)

## 2022-07-12 LAB — TYPE AND SCREEN
ABO/RH(D): O POS
Antibody Screen: NEGATIVE

## 2022-07-12 LAB — GLUCOSE, CAPILLARY
Glucose-Capillary: 112 mg/dL — ABNORMAL HIGH (ref 70–99)
Glucose-Capillary: 113 mg/dL — ABNORMAL HIGH (ref 70–99)
Glucose-Capillary: 123 mg/dL — ABNORMAL HIGH (ref 70–99)
Glucose-Capillary: 120 mg/dL — ABNORMAL HIGH (ref 70–99)
Glucose-Capillary: 110 mg/dL — ABNORMAL HIGH (ref 70–99)
Glucose-Capillary: 124 mg/dL — ABNORMAL HIGH (ref 70–99)

## 2022-07-12 LAB — MAGNESIUM: Magnesium: 2.1 mg/dL (ref 1.7–2.4)

## 2022-07-12 LAB — PHOSPHORUS: Phosphorus: 3.6 mg/dL (ref 2.5–4.6)

## 2022-07-12 LAB — AMMONIA: Ammonia: 48 umol/L — ABNORMAL HIGH (ref 9–35)

## 2022-07-12 MED ORDER — LACTATED RINGERS IV SOLN: INTRAVENOUS | Status: DC

## 2022-07-12 MED ORDER — POTASSIUM CHLORIDE 10 MEQ/50ML IV SOLN
10.0000 meq | INTRAVENOUS | Status: AC
  Administered 2022-07-12 (×4): 10 meq via INTRAVENOUS
  Filled 2022-07-12 (×4): qty 50

## 2022-07-12 MED ORDER — TRAVASOL 10 % IV SOLN
INTRAVENOUS | Status: AC
  Filled 2022-07-12: qty 1296

## 2022-07-12 NOTE — Progress Notes (Signed)
PHARMACY - TOTAL PARENTERAL NUTRITION CONSULT NOTE   Indication: Small bowel obstruction  Patient Measurements: Height: 5\' 11"  (180.3 cm) Weight: 91.6 kg (201 lb 15.1 oz) IBW/kg (Calculated) : 75.3 TPN AdjBW (KG): 91.6 Body mass index is 28.17 kg/m. Usual Weight: 190 lbs  Assessment: 41 yo male presented on 07/02/2022 for fall, found to have tibial plateau fracture with compartment syndrome s/p reduction and fasciotomy on 8/19 with Ortho. Patient now with concern for ileus vs. SBO on imaging, although patient has had multiple BMs. Surgery consulted. Pharmacy consulted to manage TPN. Patient noted to have developed moderate alcohol withdrawal symptoms.  Glucose / Insulin: no hx of BM. CBGs controlled on low end. Utilized 0 units SSI since initiation 8/28 PM Electrolytes: K 3.6>3.9 (s/p KCl total 9/28 total yesterday), CL down to 116, others WNL Goal K >/=4 and Mag >/=2 with ileus Renal: Scr <1 stable. BUN WNL  Hepatic: LFTs WNL. Tbili up to 1.4, TG 155. Last prealbumin 20 on 8/21. Albumin 1.9  Intake / Output; MIVF: UOP 0.5 ml/kg/hr, 9/21 NGT output; MIVF: LR 75 GI Imaging: 8/28 xray: slight worsening small bowel dilatation GI Surgeries / Procedures: none since TPN start  Central access: 8/27 TPN start date: 8/28  Nutritional Goals: Goal TPN rate is 100 mL/hr (provides 130 g of protein and 2486 kcals per day)  RD Assessment: Estimated Needs Total Energy Estimated Needs: 2400-2600 Total Protein Estimated Needs: 120-135g Total Fluid Estimated Needs: >/=2L  Current Nutrition:  NPO and TPN  Plan:  Increase TPN to goal rate 1000 mL/hr at 1800 TPN will provide 130g of protein and 2486 kcal, meeting 100% of needs Electrolytes in TPN: Na 46mEq/L, increase K to 72mEq/L, Ca 6mEq/L, Mg 5mEq/L, and Phos 83mmol/L. Adjust Cl:Ac to max acetate KCL 10 mEq IV x 4 already ordered per Surgery today D/c PO MVI/zinc until able to take PO medications Add standard MVI and trace elements, folic  acid 1mg  to TPN Add thiamine 250mg  daily x 5 days (last day 8/31), then 100mg  daily to TPN Continue Sensitive q6h SSI for now - consider d/c if continues to not require on goal rate TPN Reduce MVIF (LR) to 25 mL/hr at 1800 with new TPN bag hung Monitor standard TPN labs on Mon/Thurs and PRN F/u Surgery plans - clamp NGT 8/30 and give CLD per note   , PharmD, BCPS Please check AMION for all [facility] contact numbers Clinical Pharmacist 07/12/2022 9:19 AM

## 2022-07-12 NOTE — Progress Notes (Signed)
Inpatient Rehab Coordinator Note:  I met with patient at bedside to discuss CIR recommendations and goals/expectations of CIR stay.  We reviewed 3 hrs/day of therapy, physician follow up, and average length of stay 2 weeks (dependent upon progress) with goals of supervision.  We discussed need for prior auth from insurance as well as likely 24/7 supervision (initially) at discharge.  Pt lives with his parents who he reports will be able to provide assist.  Still with NGT to suction and on TPN.  Will follow for d/c of NGT and transition to/tolerance for diet and follow up with family for caregiver support prior to opening insurance request with [employer].    Shann Medal, PT, DPT Admissions Coordinator [phone] 07/12/22  12:26 PM

## 2022-07-12 NOTE — Progress Notes (Signed)
PROGRESS NOTE    Ryan Lynch  NWG:[id number] DOB: 1981/05/15 DOA: 07/02/2022 PCP: Patient, No Pcp Per  Chief Complaint  Patient presents with   Fall    Brief Narrative:  Ryan Lynch is Ryan Lynch 41 y.o. male with Ryan Lynch history of alcohol use and anxiety. Patient presented secondary to Ryan Lynch fall down his stairs and subsequent worsening pain. Upon arrival to the ED, he was found to have evidence of Ryan Lynch left tibial fracture and evidence of compartment syndrome, requiring emergent fasciotomy.   Hospital course has been c/b alcohol withdrawal and delirium.  Also c/b ileus requiring NG tube placement, general surgery currently following.  Pt with PICC for TPN.  Orthopedic surgeries completed at this time.    See below for additional details    Assessment & Plan:   Principal Problem:   Observation after surgery Active Problems:   Tibial plateau fracture, left   Sepsis ([name])   Alcohol withdrawal ([name])   Alcohol use   Compartment syndrome of left lower extremity, initial encounter ([name])   Ileus ([name])   AKI (acute kidney injury) ([name])   Transaminitis   Traumatic rhabdomyolysis ([name])   Acute metabolic encephalopathy   Acute anemia   Prolonged QT interval   Adrenal nodule ([name])   Thrombocytopenia ([name])   Compression fracture of T7 vertebra ([name])   Acute urinary retention   Anxiety   Psoriasis   Multiple rib fractures   Compression fracture of body of thoracic vertebra ([name])   Assessment and Plan: Sepsis ([name]) Tachycardic, febrile Unclear source at this point, ddx remains broad Need to w/u infectious and non infectious causes CT chest/abd/pelvis with smal to moderate right and small left pleural effusions, tightly contracted vs procelain gallbladder, high grade distal SBO, stranding along side ascending and descending colon Per discussion with orthopedics, wound looks good unasyn started, will plan for 5 days for now Blood cultures Has indwelling foley for retention, if unrevealing workup,  will need UA/culture  Tibial plateau fracture, left Patient admitted to trauma service.  S/p emergent fasciotomy on 8/19 for compartment syndrome.  Management per primary, s/p surgery for 8/24, note pending Orthopedic surgeries completed, recommending float heel off bed, NWB LLE, ice and elevate, unrestricted ROM L knee (see notes)   Alcohol withdrawal ([name]) Librium discontinued  Currently on ativan per ciwa, renewed - continues to score rather highly, I suspect there are other contributing factors to his persistent delirium and elevated CIWA scores - is chronically on benzos/alcohol, so complicated withdrawal not unexpected either High dose thiamine  multivitamin and folic acid   Alcohol use Treating withdrawal as above  Daily consumption. Alcohol level undetectable on admission. Patient hoping to detox. Started on CIWA on admission. Social work consulted.  AKI (acute kidney injury) ([name]) IV fluids UA 20 mg/dl ketones, small Hbg, 57-84 RBC's, rare bacteria -> follow  Resolved, follow   Ileus ([name]) Likely multifactorial in setting of surgery and narcotics. No abdominal pain or emesis.  CT abd/pelvis with evidence of ileus 8/24, increase in amount of small bowel gas suggesting ileus or partial obstruction  Plain films 8/25 with mild to moderate small bowel dilatation CT with concern for high grade SBO, though apparently had BM this morning, not c/w SBO -> appreciate surgery recs NG tube replaced 8/26 -> 750 ml out yesterday  Appreciate surgery recommendations PICC and TPN  Compartment syndrome of left lower extremity, initial encounter ([name]) See problem, Tibial plateau fracture, left.  Prolonged QT interval Hold antiemetics, holding zoloft Avoid QT prolonging  agents If needed, consider ativan for nausea Replace lytes, mag >2 and K.4 Repeat daily EKG's - improved today, follow  Acute anemia S/p 3 units pRBC Fluctuating, downtrended today Iron def anemia  Acute  metabolic encephalopathy Patient with an ammonia of 96 on admission but without significant confusion at that time.  He seems more confused now. Likely multifactorial with alcohol withdrawal. Ammonia recently fluctuating from normal (8/24) to mildly elevated.   I don't think this pattern of elevation c/w with hepatic encephalopathy, but would consider dosing if continued unresolving encephalopathy and elevated ammonia Lactulose enema given x1 8/28 given continued encephalopathy with elevated ammonia (will hold today and follow again tomorrow) CIWA as above High dose thiamine Normal B12, folate Will work up additionally as needed - in general he seems to be improving, will follow, may need additional lactulose  Traumatic rhabdomyolysis ([name]) Secondary to fall down the stairs. Resultant compartment syndrome. CK of 1,513, down to 975 but has increased.  CK improved  Continue IVF   Transaminitis AST/ALT elevated to 188/59 on admission respectively Secondary to rhabdomyolysis from compartment syndrome and possibly alcohol use.  Generally improved Does have elevated alk phos, mild bili elevation today Will continue to monitor  Thrombocytopenia ([name]) Likely related to trauma and blood loss. Patient with evidence of hepatic steatosis and mild splenomegaly on imaging. No prior labs available. Platelets trended down but appear to have stabilized. -Trend CBC -Watch for bleeding  Adrenal nodule ([name]) Bilateral with Ryan Lynch left 16 mm indeterminate left adrenal hypodense mass and Ryan Lynch right 10 mm indeterminate right adrenal gland mass. Recommendations for outpatient non-emergent CT or MRI of the adrenal glands.  Acute urinary retention With indwelling foley Continue for now flomax when able to take PO Would plan for trial of void in Ryan Lynch few days, would hopefully be able to start flomax first to maximize chance of success  Compression fracture of T7 vertebra ([name]) -Pain management per primary  Anxiety   Hold zoloft -Hold Klonopin while on CIWA/Librium taper  Psoriasis Noted on exam and now notice in history. Not on medication therapy per medication history.  Compression fracture of body of thoracic vertebra ([name]) Mild upper plate anterior wedge compression fx of T7 vertebral body Symptomatic management, will follow  Multiple rib fractures Right 6-9 rib fx Pulmonary toilet, pain control      DVT prophylaxis: SCD, per primary Code Status: full Family Communication: none - father over phone Disposition:   Status is: Inpatient Remains inpatient appropriate because: additional treatment for encephalopathy, ortho   Consultants:  orthopedics  Procedures:  8/19 1. EMERGENT FOUR COMPARTMENT FASCIOTOMIES LEFT LEG  Antimicrobials:  Anti-infectives (From admission, onward)    Start     Dose/Rate Route Frequency Ordered Stop   07/09/22 1300  Ampicillin-Sulbactam (UNASYN) 3 g in sodium chloride 0.9 % 100 mL IVPB        3 g 200 mL/hr over 30 Minutes Intravenous Every 6 hours 07/09/22 1203     07/07/22 1000  ceFAZolin (ANCEF) IVPB 2g/100 mL premix        2 g 200 mL/hr over 30 Minutes Intravenous On call to O.R. 07/06/22 1001 07/07/22 1136   07/05/22 1800  ceFAZolin (ANCEF) IVPB 2g/100 mL premix        2 g 200 mL/hr over 30 Minutes Intravenous Every 8 hours 07/05/22 1540 07/06/22 1445   07/05/22 0600  ceFAZolin (ANCEF) IVPB 2g/100 mL premix        2 g 200 mL/hr over 30 Minutes Intravenous  On call to O.R. 07/04/22 1225 07/05/22 0920   07/02/22 1830  ceFAZolin (ANCEF) IVPB 1 g/50 mL premix        1 g 100 mL/hr over 30 Minutes Intravenous Every 6 hours 07/02/22 1731 07/03/22 2006   07/02/22 1430  ceFAZolin (ANCEF) IVPB 1 g/50 mL premix        1 g 100 mL/hr over 30 Minutes Intravenous  Once 07/02/22 1419 07/03/22 2006       Subjective: No complaints  Objective: Vitals:   07/12/22 0300 07/12/22 0745 07/12/22 1141 07/12/22 1511  BP: 126/71 (!) 170/76 (!) 157/79 (!) 161/75   Pulse: 82 90 91 83  Resp: 14 19 14 12   Temp:  (!) 97.5 F (36.4 C) 97.9 F (36.6 C) 97.9 F (36.6 C)  TempSrc: Oral Axillary Axillary Oral  SpO2: 100% 100% 100% 100%  Weight:      Height:        Intake/Output Summary (Last 24 hours) at 07/12/2022 1801 Last data filed at 07/12/2022 1515 Gross per 24 hour  Intake --  Output 2500 ml  Net -2500 ml   Filed Weights   07/02/22 1300 07/03/22 2222  Weight: 86.2 kg 91.6 kg    Examination:  General: No acute distress. Cardiovascular: rRR Lungs: unlabored Abdomen: Soft, nontender, nondistended Neurological: Delisa Finck&Ox3, still slightly confused, but seems to be improving overall, no sig asterixis, but difficult to tell with mitts on  Extremities: No clubbing or cyanosis. No edema.   Data Reviewed: I have personally reviewed following labs and imaging studies  CBC: Recent Labs  Lab 07/08/22 0243 07/08/22 1747 07/09/22 0232 07/10/22 0132 07/11/22 0301 07/12/22 0137 07/12/22 1541  WBC 3.6*  --  6.8 3.5* 4.1 3.9*  --   NEUTROABS 2.4  --  5.4 2.6 2.7 2.6  --   HGB 7.5*   < > 8.8* 7.4* 7.4* 7.0* 7.4*  HCT 23.4*   < > 27.6* 23.8* 24.6* 22.8* 23.1*  MCV 89.7  --  89.6 90.5 92.8 91.6  --   PLT 87*  --  168 126* 134* 116*  --    < > = values in this interval not displayed.    Basic Metabolic Panel: Recent Labs  Lab 07/08/22 0243 07/09/22 0232 07/10/22 0132 07/11/22 0301 07/12/22 0137  NA 140 140 140 145 143  K 3.1* 3.2* 3.5 3.6 3.9  CL 109 111 110 117* 116*  CO2 23 22 23 23 23   GLUCOSE 110* 166* 118* 113* 119*  BUN 6 5* 8 5* 5*  CREATININE 0.63 0.67 0.86 0.69 0.65  CALCIUM 7.8* 8.0* 7.4* 7.9* 7.6*  MG 2.2 2.1 2.0 2.4 2.1  PHOS 2.6 2.0* 3.3 2.9 3.6    GFR: Estimated Creatinine Clearance: 139.2 mL/min (by C-G formula based on SCr of 0.65 mg/dL).  Liver Function Tests: Recent Labs  Lab 07/08/22 0243 07/09/22 0232 07/10/22 0132 07/11/22 0301 07/12/22 0137  AST 47* 44* 25 23 26   ALT 36 39 28 26 20   ALKPHOS 58 131*  114 123 150*  BILITOT 1.9* 1.9* 1.3* 1.1 1.4*  PROT 5.0* 5.5* 4.6* 5.2* 4.6*  ALBUMIN 2.3* 2.4* 2.0* 2.1* 1.9*    CBG: Recent Labs  Lab 07/11/22 2350 07/12/22 0332 07/12/22 0743 07/12/22 1139 07/12/22 1512  GLUCAP 105* 112* 113* 123* 120*     Recent Results (from the past 240 hour(s))  Culture, blood (Routine X 2) w Reflex to ID Panel     Status: None (Preliminary result)   Collection  Time: 07/09/22 11:48 AM   Specimen: BLOOD  Result Value Ref Range Status   Specimen Description BLOOD LEFT ANTECUBITAL  Final   Special Requests   Final    BOTTLES DRAWN AEROBIC AND ANAEROBIC Blood Culture results may not be optimal due to an excessive volume of blood received in culture bottles   Culture   Final    NO GROWTH 3 DAYS Performed at [facility], [address], [place], [place] [address]    Report Status PENDING  Incomplete  Culture, blood (Routine X 2) w Reflex to ID Panel     Status: None (Preliminary result)   Collection Time: 07/09/22 12:10 PM   Specimen: BLOOD  Result Value Ref Range Status   Specimen Description BLOOD RIGHT ANTECUBITAL  Final   Special Requests   Final    BOTTLES DRAWN AEROBIC AND ANAEROBIC Blood Culture adequate volume   Culture   Final    NO GROWTH 3 DAYS Performed at [facility], [address] [place], [place] [address]    Report Status PENDING  Incomplete         Radiology Studies: DG Abd Portable 1V-Small Bowel Obstruction Protocol-initial, 8 hr delay  Result Date: 07/11/2022 CLINICAL DATA:  Small-bowel obstruction. EXAM: PORTABLE ABDOMEN - 1 VIEW COMPARISON:  Portable study yesterday at 7:45 p.m. FINDINGS: There are multiple dilated small bowel segments in the with lower abdomen, maximum caliber 4 cm, previously 3.7 cm. Contrast intermixed with bowel gas continues to be seen in the descending colon through into the rectum. No colonic dilatation is seen. There is no supine evidence of free air. No new calcification is seen with  right upper quadrant gallbladder calcification again partially visible. NGT is in place with the tip abutting the wall of the body of stomach. IMPRESSION: Slight worsening in small bowel dilatation, was previously up to 3.7 cm now maximum 4 cm. Contrast in the normal caliber left-sided colon and rectum. Electronically Signed   By: Almira Bar M.D.   On: 07/11/2022 06:30   DG Abd Portable 1V-Small Bowel Protocol-Position Verification  Result Date: 07/10/2022 CLINICAL DATA:  Check NG placement EXAM: PORTABLE ABDOMEN - 1 VIEW COMPARISON:  None Available. FINDINGS: Scattered large and small bowel gas is noted. Findings of small-bowel obstruction are seen. Gastric catheter is noted with the tip in the stomach. Proximal side port lies at the gastroesophageal junction and could be advanced further into the stomach. Calcification is again noted in the right upper quadrant related to the gallbladder. IMPRESSION: Gastric catheter as described. This could be advanced slightly further into the stomach. Changes consistent with small bowel obstruction. Electronically Signed   By: Alcide Clever M.D.   On: 07/10/2022 20:05   [place] EKG SITE RITE  Result Date: 07/10/2022 If [facility] image not attached, placement could not be confirmed due to current cardiac rhythm.       Scheduled Meds:  Chlorhexidine Gluconate Cloth  6 each Topical Daily   cholecalciferol  2,000 Units Oral BID   enoxaparin (LOVENOX) injection  40 mg Subcutaneous Daily   insulin aspart  0-9 Units Subcutaneous Q4H   LORazepam  0-4 mg Intravenous Q12H   sodium chloride flush  10-40 mL Intracatheter Q12H   Continuous Infusions:  ampicillin-sulbactam (UNASYN) IV 3 g (07/12/22 1445)   lactated ringers 25 mL/hr at 07/12/22 1700   TPN ADULT (ION) 100 mL/hr at 07/12/22 1755     LOS: 10 days    Time spent: over  30 min    Lacretia Nicks, MD [facility]   To contact the attending provider between 7A-7P or the covering provider  during after hours 7P-7A, please log into the web site [email or web address] and access using universal [place] password for that web site. If you do not have the password, please call the hospital operator.  07/12/2022, 6:01 PM

## 2022-07-12 NOTE — Progress Notes (Signed)
Physical Therapy Treatment Patient Details Name: Ryan Lynch MRN: [id number] DOB: [birth year] Today's Date: 07/12/2022   History of Present Illness 41 yo male admitted 07/02/22 following fall down the stairs with subsequent tibial plateua fx requiring 4 compartment fasciotomies and placement of ex fix. S/p ROIF tibial plateu fx 8/22. s/p I& D LLE and second closure of wound 07/07/22.  PMH includes anxiety, ACL repair, psoriasis and daily consumption of alcohol.    PT Comments    The pt was attempting to get OOB to his water upon initial arrival of PT, after reminder of ice chip restriction and lines/injuries the pt states "oh I forgot, you're right I shouldn't get up." With PT present and assist for line management and totalA to don LLE hinged knee brace, the pt was then able to complete transition to sitting EOB with minG, but requires increased time and HOB elevated. The pt was then able to complete multiple sit-stand transfers with modA and max cues for hand positioning and to maintain NWB LLE. The pt completed x2 small bouts of R lateral hopping with RLE but is limited by rapid fatigue. After return to bed, the pt was instructed in exercises to improve muscle activation in LLE that he is to complete outside of therapy sessions. The pt verbalized understanding. Will continue to benefit from skilled PT acutely and following d/c to progress functional endurance, stability, gait progression, and independence with mobility.     Recommendations for follow up therapy are one component of a multi-disciplinary discharge planning process, led by the attending physician.  Recommendations may be updated based on patient status, additional functional criteria and insurance authorization.  Follow Up Recommendations  Acute inpatient rehab (3hours/day)     Assistance Recommended at Discharge Frequent or constant Supervision/Assistance  Patient can return home with the following A little help with walking and/or  transfers;A little help with bathing/dressing/bathroom;Assistance with cooking/housework;Assist for transportation;Help with stairs or ramp for entrance;Direct supervision/assist for medications management;Direct supervision/assist for financial management   Equipment Recommendations  Rolling walker (2 wheels);BSC/3in1;Wheelchair (measurements PT);Wheelchair cushion (measurements PT)    Recommendations for Other Services       Precautions / Restrictions Precautions Precautions: Fall Precaution Comments: watch HR/O2, mittens, NG tube to wall suction Required Braces or Orthoses: Other Brace Knee Immobilizer - Left: Other (comment) (on when OOB able to d/c supine per ortho notes 8/28) Other Brace: hinged knee brace, unlocked on when OOB able to d/c supine per ortho notes 8/28 Restrictions Weight Bearing Restrictions: Yes LLE Weight Bearing: Non weight bearing     Mobility  Bed Mobility Overal bed mobility: Needs Assistance Bed Mobility: Supine to Sit, Sit to Supine     Supine to sit: Min guard, HOB elevated Sit to supine: Min assist   General bed mobility comments: minG to complete movement to EOB with pt managing LLE, does require use of bed rails and HOB elevated. minA to return BLE to bed as well as max cues for technique.    Transfers Overall transfer level: Needs assistance Equipment used: Rolling walker (2 wheels) Transfers: Sit to/from Stand Sit to Stand: Mod assist           General transfer comment: modA to rise from EOB x3. cues for hand placement with each rep and cues for LLE NWB    Ambulation/Gait Ambulation/Gait assistance: Min assist Gait Distance (Feet): 2 Feet (+ 2) Assistive device: Rolling walker (2 wheels)   Gait velocity: decreased Gait velocity interpretation: <1.31 ft/sec, indicative of household ambulator  General Gait Details: hop-to pattern towards R side with cues for sequencing and technique. Pt needing increased time to understand  technique, impulsive with hops and then fatigues quickly      Balance Overall balance assessment: Needs assistance Sitting-balance support: Feet supported, Bilateral upper extremity supported Sitting balance-Leahy Scale: Fair     Standing balance support: During functional activity, Bilateral upper extremity supported, Reliant on assistive device for balance Standing balance-Leahy Scale: Poor Standing balance comment: Requires external support and UE support for standing, maintains NWB LLE                            Cognition Arousal/Alertness: Awake/alert Behavior During Therapy: Impulsive Overall Cognitive Status: Impaired/Different from baseline Area of Impairment: Safety/judgement, Following commands, Attention, Awareness, Memory, Problem solving                   Current Attention Level: Sustained Memory: Decreased recall of precautions, Decreased short-term memory Following Commands: Follows one step commands consistently Safety/Judgement: Decreased awareness of safety Awareness: Intellectual Problem Solving: Slow processing General Comments: pt able to recall incident and injuries. able to state wt bearing precautions but not that he needs brace. answering simlpe questions accurately regarding hobbies and work, but needing increased time and cues to problem solve through session        Exercises General Exercises - Lower Extremity Ankle Circles/Pumps: AROM, Both, 15 reps, Supine Quad Sets: AROM, Left, 10 reps, Supine    General Comments General comments (skin integrity, edema, etc.): VSS on 4L O2.      Pertinent Vitals/Pain Pain Assessment Pain Assessment: 0-10 Pain Score: 7  Pain Location: L Leg, especially in dependent position Pain Descriptors / Indicators: Operative site guarding, Discomfort, Grimacing, Guarding Pain Intervention(s): Limited activity within patient's tolerance, Monitored during session, Repositioned, Patient requesting pain  meds-RN notified     PT Goals (current goals can now be found in the care plan section) Acute Rehab PT Goals Patient Stated Goal: to get better PT Goal Formulation: With patient Time For Goal Achievement: 07/17/22 Potential to Achieve Goals: Good Progress towards PT goals: Progressing toward goals    Frequency    Min 5X/week      PT Plan Current plan remains appropriate       AM-PAC PT "6 Clicks" Mobility   Outcome Measure  Help needed turning from your back to your side while in a flat bed without using bedrails?: A Little Help needed moving from lying on your back to sitting on the side of a flat bed without using bedrails?: A Little Help needed moving to and from a bed to a chair (including a wheelchair)?: A Little Help needed standing up from a chair using your arms (e.g., wheelchair or bedside chair)?: A Little Help needed to walk in hospital room?: Total Help needed climbing 3-5 steps with a railing? : Total 6 Click Score: 14    End of Session Equipment Utilized During Treatment: Oxygen;Gait belt Activity Tolerance: Patient tolerated treatment well Patient left: in bed;with call bell/phone within reach;with bed alarm set;with restraints reapplied Nurse Communication: Mobility status PT Visit Diagnosis: Unsteadiness on feet (R26.81);Difficulty in walking, not elsewhere classified (R26.2) Pain - Right/Left: Left Pain - part of body: Leg     Time: 4166-0630 PT Time Calculation (min) (ACUTE ONLY): 33 min  Charges:  $Therapeutic Exercise: 23-37 mins  Vickki Muff, PT, DPT   Acute Rehabilitation Department   Ronnie Derby 07/12/2022, 12:59 PM

## 2022-07-12 NOTE — Progress Notes (Signed)
Orthopaedic Trauma Service Progress Note  Patient ID: Ryan Lynch MRN: [id number] DOB/AGE: 1981-05-23 41 y.o.  Subjective:  Ortho issues remain stable ? Decreased confusion today    ROS As above  Objective:   VITALS:   Vitals:   07/11/22 2344 07/12/22 0300 07/12/22 0745 07/12/22 1141  BP: 135/67 126/71 (!) 170/76 (!) 157/79  Pulse: 85 82 90 91  Resp: 13 14 19 14   Temp: 99.1 F (37.3 C)  (!) 97.5 F (36.4 C) 97.9 F (36.6 C)  TempSrc: Oral Oral Axillary Axillary  SpO2: 98% 100% 100% 100%  Weight:      Height:        Estimated body mass index is 28.17 kg/m as calculated from the following:   Height as of this encounter: 5\' 11"  (1.803 m).   Weight as of this encounter: 91.6 kg.   Intake/Output      08/28 0701 08/29 0700 08/29 0701 08/30 0700   I.V. (mL/kg)     IV Piggyback     Total Intake(mL/kg)     Urine (mL/kg/hr) 1050 (0.5)    Emesis/NG output 750    Stool 0    Total Output 1800    Net -1800         Stool Occurrence 1 x      LABS  Results for orders placed or performed during the hospital encounter of 07/02/22 (from the past 24 hour(s))  Glucose, capillary     Status: Abnormal   Collection Time: 07/11/22  7:27 PM  Result Value Ref Range   Glucose-Capillary 100 (H) 70 - 99 mg/dL  Glucose, capillary     Status: Abnormal   Collection Time: 07/11/22 11:50 PM  Result Value Ref Range   Glucose-Capillary 105 (H) 70 - 99 mg/dL  Magnesium     Status: None   Collection Time: 07/12/22  1:37 AM  Result Value Ref Range   Magnesium 2.1 1.7 - 2.4 mg/dL  Phosphorus     Status: None   Collection Time: 07/12/22  1:37 AM  Result Value Ref Range   Phosphorus 3.6 2.5 - 4.6 mg/dL  Ammonia     Status: Abnormal   Collection Time: 07/12/22  1:37 AM  Result Value Ref Range   Ammonia 48 (H) 9 - 35 umol/L  CBC with Differential/Platelet     Status: Abnormal   Collection Time: 07/12/22  1:37  AM  Result Value Ref Range   WBC 3.9 (L) 4.0 - 10.5 K/uL   RBC 2.49 (L) 4.22 - 5.81 MIL/uL   Hemoglobin 7.0 (L) 13.0 - 17.0 g/dL   HCT 07/14/22 (L) 07/14/22 - 77.8 %   MCV 91.6 80.0 - 100.0 fL   MCH 28.1 26.0 - 34.0 pg   MCHC 30.7 30.0 - 36.0 g/dL   RDW 24.2 (H) 35.3 - 61.4 %   Platelets 116 (L) 150 - 400 K/uL   nRBC 0.0 0.0 - 0.2 %   Neutrophils Relative % 66 %   Neutro Abs 2.6 1.7 - 7.7 K/uL   Lymphocytes Relative 20 %   Lymphs Abs 0.8 0.7 - 4.0 K/uL   Monocytes Relative 7 %   Monocytes Absolute 0.3 0.1 - 1.0 K/uL   Eosinophils Relative 2 %   Eosinophils Absolute 0.1 0.0 - 0.5 K/uL   Basophils Relative 1 %  Basophils Absolute 0.0 0.0 - 0.1 K/uL   Immature Granulocytes 4 %   Abs Immature Granulocytes 0.17 (H) 0.00 - 0.07 K/uL  Comprehensive metabolic panel     Status: Abnormal   Collection Time: 07/12/22  1:37 AM  Result Value Ref Range   Sodium 143 135 - 145 mmol/L   Potassium 3.9 3.5 - 5.1 mmol/L   Chloride 116 (H) 98 - 111 mmol/L   CO2 23 22 - 32 mmol/L   Glucose, Bld 119 (H) 70 - 99 mg/dL   BUN 5 (L) 6 - 20 mg/dL   Creatinine, Ser 1.49 0.61 - 1.24 mg/dL   Calcium 7.6 (L) 8.9 - 10.3 mg/dL   Total Protein 4.6 (L) 6.5 - 8.1 g/dL   Albumin 1.9 (L) 3.5 - 5.0 g/dL   AST 26 15 - 41 U/L   ALT 20 0 - 44 U/L   Alkaline Phosphatase 150 (H) 38 - 126 U/L   Total Bilirubin 1.4 (H) 0.3 - 1.2 mg/dL   GFR, Estimated >70 >26 mL/min   Anion gap 4 (L) 5 - 15  Glucose, capillary     Status: Abnormal   Collection Time: 07/12/22  3:32 AM  Result Value Ref Range   Glucose-Capillary 112 (H) 70 - 99 mg/dL  Glucose, capillary     Status: Abnormal   Collection Time: 07/12/22  7:43 AM  Result Value Ref Range   Glucose-Capillary 113 (H) 70 - 99 mg/dL  Glucose, capillary     Status: Abnormal   Collection Time: 07/12/22 11:39 AM  Result Value Ref Range   Glucose-Capillary 123 (H) 70 - 99 mg/dL     PHYSICAL EXAM:   Gen: resting in bed, NAD, calm, mitts in place  Lungs: unlabored Cardiac:  reg Abd: NGT in place, tube feeds active  Ext:       Left lower Extremity              all wounds are stable   Dressings stable             Ext warm              Mild swelling to foot and ankle             + DP pulse             motor functions symmetric to contra-lateral side              DPN, SPN sensation nearly normal per his report             TN sensation appears symmetric to contralateral side  Assessment/Plan: 5 Days Post-Op    Anti-infectives (From admission, onward)    Start     Dose/Rate Route Frequency Ordered Stop   07/09/22 1300  Ampicillin-Sulbactam (UNASYN) 3 g in sodium chloride 0.9 % 100 mL IVPB        3 g 200 mL/hr over 30 Minutes Intravenous Every 6 hours 07/09/22 1203     07/07/22 1000  ceFAZolin (ANCEF) IVPB 2g/100 mL premix        2 g 200 mL/hr over 30 Minutes Intravenous On call to O.R. 07/06/22 1001 07/07/22 1136   07/05/22 1800  ceFAZolin (ANCEF) IVPB 2g/100 mL premix        2 g 200 mL/hr over 30 Minutes Intravenous Every 8 hours 07/05/22 1540 07/06/22 1445   07/05/22 0600  ceFAZolin (ANCEF) IVPB 2g/100 mL premix        2 g 200  mL/hr over 30 Minutes Intravenous On call to O.R. 07/04/22 1225 07/05/22 0920   07/02/22 1830  ceFAZolin (ANCEF) IVPB 1 g/50 mL premix        1 g 100 mL/hr over 30 Minutes Intravenous Every 6 hours 07/02/22 1731 07/03/22 2006   07/02/22 1430  ceFAZolin (ANCEF) IVPB 1 g/50 mL premix        1 g 100 mL/hr over 30 Minutes Intravenous  Once 07/02/22 1419 07/03/22 2006     .  POD/HD#: 29  41 y/o male, alcoholic, s/p fall with closed L bicondylar tibial plateau fracture and acute compartment syndrome left leg    -fall   - L bicondylar tibial plateau fracture s/p external fixation and acute compartment syndrome s/p 4 compartment fasciotomies              NWB L leg             Ice and elevate              ok to leave hinged brace off when in bed                         Brace on when mobilizing with therapy               continue to use zero knee bone foam throughout the day                Ortho surgeries completed             Float heel off bed                          Pressure relief dressing                Unrestricted ROM L knee                Therapies     PT- please teach HEP for L knee ROM- AROM, PROM. Prone exercises as well. No ROM restrictions.  Quad sets, SLR, LAQ, SAQ, heel slides, stretching, prone flexion and extension   Ankle theraband program, heel cord stretching, toe towel curls, etc   No pillows under bend of knee when at rest, ok to place under heel to help work on extension. Can also use zero knee bone foam if available   Hinged knee brace on at all times, unlocked.  Ok to work on [employer] without brace with therapist supervision.  Brace back on after ROM session if removed   - EtOH abuse/withdrawal              CIWA             SW consulted                active withdrawals                           Continue care in 4NP   - T7 compression fracture              No pain              Do not think this is acute              No further intervention    - ileus              Per general surgery and medicine        -  Rhabdomyolysis              resolved    - transaminitis              Likely multifactorial---> etoh, rhabdo                          Improved    - thrombocytopenia              resolved    - Pain management:             Multimodal    - ABL anemia/Hemodynamics             monitor              cbc this afternoon               - Medical issues              Per medicine               - DVT/PE prophylaxis:             Lovenox               - ID:              Periop abx completed    - Metabolic Bone Disease:             Vitamin d deficiency                          Supplement     - Impediments to fracture healing:             EtOH abuse             Vitamin d deficiency    - Dispo:             Therapies             Continue with inpatient care    CIR eval                 Mearl Latin, PA-C [phone] (C) 07/12/2022, 12:40 PM  [facility] [address] [place] [address] [phone] [phone] (F)    After 5pm and on the weekends please log on to Amion, go to orthopaedics and the look under the [facility] Call for the provider(s) on call. You can also call our office at [phone] and then follow the prompts to be connected to the call team.   Patient ID: Chanda Busing, male   DOB: December 04, 1980, 41 y.o.   MRN: [id number]

## 2022-07-12 NOTE — Progress Notes (Signed)
General Surgery Follow Up Note  Subjective:    Overnight Issues: decrease ice intake and ecrease in NGT output.  Still moving his bowels.  Passed SBO protocol  Objective:  Vital signs for last 24 hours: Temp:  [97.5 F (36.4 C)-99.1 F (37.3 C)] 97.5 F (36.4 C) (08/29 0745) Pulse Rate:  [82-102] 90 (08/29 0745) Resp:  [13-26] 19 (08/29 0745) BP: (126-172)/(67-78) 170/76 (08/29 0745) SpO2:  [94 %-100 %] 100 % (08/29 0745)   Intake/Output from previous day: 08/28 0701 - 08/29 0700 In: -  Out: 1800 [Urine:1050; Emesis/NG output:750]  Intake/Output this shift: No intake/output data recorded.  Physical Exam:  Gen: comfortable, no distress Pulm: unlabored breathing Abd: soft, NT, slightly distention, NGT with 750cc of output yesterday.  All bilious    Results for orders placed or performed during the hospital encounter of 07/02/22 (from the past 24 hour(s))  Glucose, capillary     Status: Abnormal   Collection Time: 07/11/22  7:27 PM  Result Value Ref Range   Glucose-Capillary 100 (H) 70 - 99 mg/dL  Glucose, capillary     Status: Abnormal   Collection Time: 07/11/22 11:50 PM  Result Value Ref Range   Glucose-Capillary 105 (H) 70 - 99 mg/dL  Magnesium     Status: None   Collection Time: 07/12/22  1:37 AM  Result Value Ref Range   Magnesium 2.1 1.7 - 2.4 mg/dL  Phosphorus     Status: None   Collection Time: 07/12/22  1:37 AM  Result Value Ref Range   Phosphorus 3.6 2.5 - 4.6 mg/dL  Ammonia     Status: Abnormal   Collection Time: 07/12/22  1:37 AM  Result Value Ref Range   Ammonia 48 (H) 9 - 35 umol/L  CBC with Differential/Platelet     Status: Abnormal   Collection Time: 07/12/22  1:37 AM  Result Value Ref Range   WBC 3.9 (L) 4.0 - 10.5 K/uL   RBC 2.49 (L) 4.22 - 5.81 MIL/uL   Hemoglobin 7.0 (L) 13.0 - 17.0 g/dL   HCT 40.9 (L) 73.5 - 32.9 %   MCV 91.6 80.0 - 100.0 fL   MCH 28.1 26.0 - 34.0 pg   MCHC 30.7 30.0 - 36.0 g/dL   RDW 92.4 (H) 26.8 - 34.1 %    Platelets 116 (L) 150 - 400 K/uL   nRBC 0.0 0.0 - 0.2 %   Neutrophils Relative % 66 %   Neutro Abs 2.6 1.7 - 7.7 K/uL   Lymphocytes Relative 20 %   Lymphs Abs 0.8 0.7 - 4.0 K/uL   Monocytes Relative 7 %   Monocytes Absolute 0.3 0.1 - 1.0 K/uL   Eosinophils Relative 2 %   Eosinophils Absolute 0.1 0.0 - 0.5 K/uL   Basophils Relative 1 %   Basophils Absolute 0.0 0.0 - 0.1 K/uL   Immature Granulocytes 4 %   Abs Immature Granulocytes 0.17 (H) 0.00 - 0.07 K/uL  Comprehensive metabolic panel     Status: Abnormal   Collection Time: 07/12/22  1:37 AM  Result Value Ref Range   Sodium 143 135 - 145 mmol/L   Potassium 3.9 3.5 - 5.1 mmol/L   Chloride 116 (H) 98 - 111 mmol/L   CO2 23 22 - 32 mmol/L   Glucose, Bld 119 (H) 70 - 99 mg/dL   BUN 5 (L) 6 - 20 mg/dL   Creatinine, Ser 9.62 0.61 - 1.24 mg/dL   Calcium 7.6 (L) 8.9 - 10.3 mg/dL  Total Protein 4.6 (L) 6.5 - 8.1 g/dL   Albumin 1.9 (L) 3.5 - 5.0 g/dL   AST 26 15 - 41 U/L   ALT 20 0 - 44 U/L   Alkaline Phosphatase 150 (H) 38 - 126 U/L   Total Bilirubin 1.4 (H) 0.3 - 1.2 mg/dL   GFR, Estimated >99 >35 mL/min   Anion gap 4 (L) 5 - 15  Glucose, capillary     Status: Abnormal   Collection Time: 07/12/22  3:32 AM  Result Value Ref Range   Glucose-Capillary 112 (H) 70 - 99 mg/dL  Glucose, capillary     Status: Abnormal   Collection Time: 07/12/22  7:43 AM  Result Value Ref Range   Glucose-Capillary 113 (H) 70 - 99 mg/dL    Assessment & Plan: The plan of care was discussed with the bedside nurse for the day, who is in agreement with this plan and no additional concerns were raised.   Present on Admission:  Compartment syndrome of left lower extremity, initial encounter ([name])    LOS: 10 days   Additional comments:I reviewed the patient's new clinical lab test results.   and I reviewed the patients new imaging test results.   Discussed patient with RN, reviewed notes by ortho, medicine   Ileus -passed SBO protocol with contrast in  his colon and rectum. -cont NGT for today, output going down, but still high.  Suspect colon has awakened but small bowel not totally awake yet.  Hopefully if output continues to downtrend, can clamp NGT tomorrow and give CLD -mobilize as able -mag> 2, K >4.  K better at 3.9.  given several more runs today  FEN - NGT/NPO/IVFs/K ID - none currently needed from our standpoint, unasyn per medicine VTE - lovenox  Status post fall with tibial plateau fracture and compartment syndrome Status post fasciotomy and Ex-Fix EtOH abuse Transaminitis - resolved Thrombocytopenia Acute blood loss anemia-stable Acute kidney injury-resolved Elevated total bilirubin-1.4 Elevated alkaline phosphatase level - suspect overall LFTs somewhat related to liver dysfunction given ETOH use, etc Bilateral adrenal nodules-agree with outpatient follow-up Compression fracture of T7   Letha Cape, PA-C Please use [email or web address] to contact on call provider  07/12/2022

## 2022-07-13 DIAGNOSIS — Z4889 Encounter for other specified surgical aftercare: Secondary | ICD-10-CM | POA: Diagnosis not present

## 2022-07-13 LAB — BASIC METABOLIC PANEL
Anion gap: 9 (ref 5–15)
BUN: 6 mg/dL (ref 6–20)
CO2: 26 mmol/L (ref 22–32)
Calcium: 8.6 mg/dL — ABNORMAL LOW (ref 8.9–10.3)
Chloride: 107 mmol/L (ref 98–111)
Creatinine, Ser: 0.58 mg/dL — ABNORMAL LOW (ref 0.61–1.24)
GFR, Estimated: >60 mL/min (ref >60)
Glucose, Bld: 107 mg/dL — ABNORMAL HIGH (ref 70–99)
Potassium: 4.5 mmol/L (ref 3.5–5.1)
Sodium: 142 mmol/L (ref 135–145)

## 2022-07-13 LAB — GLUCOSE, CAPILLARY
Glucose-Capillary: 116 mg/dL — ABNORMAL HIGH (ref 70–99)
Glucose-Capillary: 132 mg/dL — ABNORMAL HIGH (ref 70–99)
Glucose-Capillary: 119 mg/dL — ABNORMAL HIGH (ref 70–99)
Glucose-Capillary: 127 mg/dL — ABNORMAL HIGH (ref 70–99)
Glucose-Capillary: 124 mg/dL — ABNORMAL HIGH (ref 70–99)

## 2022-07-13 MED ORDER — TRAVASOL 10 % IV SOLN
INTRAVENOUS | Status: AC
  Filled 2022-07-13: qty 1296

## 2022-07-13 MED ORDER — LACTULOSE 10 GM/15ML PO SOLN
20.0000 g | Freq: Every day | ORAL | Status: DC
  Administered 2022-07-13 – 2022-07-14 (×2): 20 g via ORAL
  Filled 2022-07-13 (×2): qty 30

## 2022-07-13 MED ORDER — SODIUM CHLORIDE 0.9 % IV SOLN
250.0000 mg | Freq: Every day | INTRAVENOUS | Status: AC
  Administered 2022-07-13 – 2022-07-14 (×2): 250 mg via INTRAVENOUS
  Filled 2022-07-13 (×2): qty 20

## 2022-07-13 MED ORDER — CHLORDIAZEPOXIDE HCL 5 MG PO CAPS
5.0000 mg | ORAL_CAPSULE | Freq: Two times a day (BID) | ORAL | Status: DC
  Administered 2022-07-13 – 2022-07-14 (×2): 5 mg via ORAL
  Filled 2022-07-13 (×3): qty 1

## 2022-07-13 NOTE — Progress Notes (Signed)
PROGRESS NOTE   Ryan Lynch  TDD:[id number] DOB: Nov 19, 1980 DOA: 07/02/2022 PCP: Patient, No Pcp Per  Brief Narrative:  41 year old male known EtOH plan drinks [4-10 drinks per day] anxiety ADHD 8/19 fal- fall downstairs tibial plateau fracture compartment syndrome-emergent fasciotomy Dr. Carola Frost in OR--also T7 compression fracture as well as multiple 6 through 9 rib fractures 8/21-developed further distention of abdomen pelvis and found to have ileus 8/25 developed nausea vomiting tachypnea tachycardia no fever Unasyn initiated for fever of unknown origin  Hospitalization complicated by metabolic encephalopathy and apparent tachycardia concerning for fever  Hospital-Problem based course  Fall leading to polytrauma rib fractures, T7 compression fractures Tibial plateau fracture status post closed reduction left tibia fracture with fasciotomy 07/02/2022 left lower extremity  pain control--therapy recommendations and management as per Ortho trauma Tylenol 325-650 every 6 as needed mild pain, Dilaudid 0.5-1 every 4 as needed severe pain  Traumatic rhabdomyolysis CK of 1513 and resolved-fluids at 20 cc/H  Alcohol withdrawal versus hepatic encephalopathy Librium was discontinued earlier in hospital stay he continues to be somewhat diaphoretic tachycardic which may be withdrawal from his antianxiety meds Continuing high-dose thiamine multivitamin folic acid Lactulose was given during hospitalization but discontinued--we will resume this at a low dose of 20 mg orally-I will also give Librium 5 mg twice daily  Ileus/possible SBO This developed on 8/21-seems like it may be resolving we will try to advance diet-would continue for now TPN-he has pulled out his NG tube today --- start with clears as having frequent bowel movements--- if continuous nausea vomiting need to replace  Acute urinary retention Flomax to be given and try voiding trial in the next several days  Bilateral adrenal 20mm left,  10 mm right needs outpatient CT MRI  Underlying anxiety Resumed Librium 5 twice daily as above  Probable underlying cirrhosis with thrombocytopenia and transaminitis Check LFTs in a.m. as was not done today  Anemia likely of combine blood loss and cytopenia from cirrhosis Transfusion threshold is less than 7-monitor trends and replace normal Iron level was 18 saturation ratios shows 5 therefore I will with IV iron   DVT prophylaxis: SCDs Code Status: Full Family Communication: None present Disposition:  Status is: Inpatient Remains inpatient appropriate because:   Still not stable and not able to take p.o. completely will need further work-up   Consultants:  General surgery Ortho trauma  Procedures:   Antimicrobials: Unasyn until 8/30   Subjective: Feels fair-some nausea yesterday but passing good stools today really feels hungry-he pulled out the NG tube Quite tremulous still no chest pain no fever  Objective: Vitals:   07/12/22 1917 07/12/22 2304 07/13/22 0316 07/13/22 0742  BP: 135/68 138/71 (!) 141/75 (!) 140/79  Pulse: 92 77 86 (!) 106  Resp: 15 11 11 15   Temp: 97.7 F (36.5 C) 97.8 F (36.6 C) 98.2 F (36.8 C) 98.8 F (37.1 C)  TempSrc: Oral Oral Oral Oral  SpO2: 100% 100% 100% 93%  Weight:      Height:        Intake/Output Summary (Last 24 hours) at 07/13/2022 1033 Last data filed at 07/13/2022 0801 Gross per 24 hour  Intake --  Output 3950 ml  Net -3950 ml   Filed Weights   07/02/22 1300 07/03/22 2222  Weight: 86.2 kg 91.6 kg    Examination:  EOMI NCAT no focal deficit no icterus no pallor moving 4 limbs equally although does have tremor S1-S2 no murmur Chest is clear no rhonchi no wheeze Slightly distended  no rebound no guarding No lower extremity edema somewhat discoloration suggestive postop changes to his lower extremity 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  Data Reviewed: personally  reviewed   CBC    Component Value Date/Time   WBC 3.9 (L) 07/12/2022 0137   RBC 2.49 (L) 07/12/2022 0137   HGB 7.4 (L) 07/12/2022 1541   HCT 23.1 (L) 07/12/2022 1541   PLT 116 (L) 07/12/2022 0137   MCV 91.6 07/12/2022 0137   MCH 28.1 07/12/2022 0137   MCHC 30.7 07/12/2022 0137   RDW 17.0 (H) 07/12/2022 0137   LYMPHSABS 0.8 07/12/2022 0137   MONOABS 0.3 07/12/2022 0137   EOSABS 0.1 07/12/2022 0137   BASOSABS 0.0 07/12/2022 0137      Latest Ref Rng & Units 07/13/2022    3:46 AM 07/12/2022    1:37 AM 07/11/2022    3:01 AM  CMP  Glucose 70 - 99 mg/dL 735  329  924   BUN 6 - 20 mg/dL 6  5  5    Creatinine 0.61 - 1.24 mg/dL  2.68  3.41   Sodium 135 - 145 mmol/L 142  143  145   Potassium 3.5 - 5.1 mmol/L 4.5  3.9  3.6   Chloride 98 - 111 mmol/L 107  116  117   CO2 22 - 32 mmol/L 26  23  23    Calcium 8.9 - 10.3 mg/dL 8.6  7.6  7.9   Total Protein 6.5 - 8.1 g/dL  4.6  5.2   Total Bilirubin 0.3 - 1.2 mg/dL  1.4  1.1   Alkaline Phos 38 - 126 U/L  150  123   AST 15 - 41 U/L  26  23   ALT 0 - 44 U/L  20  26      Radiology Studies: No results found.   Scheduled Meds:  Chlorhexidine Gluconate Cloth  6 each Topical Daily   cholecalciferol  2,000 Units Oral BID   enoxaparin (LOVENOX) injection  40 mg Subcutaneous Daily   sodium chloride flush  10-40 mL Intracatheter Q12H   Continuous Infusions:  ampicillin-sulbactam (UNASYN) IV 3 g (07/13/22 0817)   lactated ringers 25 mL/hr at 07/12/22 1700   TPN ADULT (ION) 100 mL/hr at 07/12/22 1755   TPN ADULT (ION)       LOS: 11 days   Time spent: 71  07/14/22, MD [facility] To contact the attending provider between 7A-7P or the covering provider during after hours 7P-7A, please log into the web site [email or web address] and access using universal [place] password for that web site. If you do not have the password, please call the hospital operator.  07/13/2022, 10:33 AM

## 2022-07-13 NOTE — PMR Pre-admission (Signed)
PMR Admission Coordinator Pre-Admission Assessment  Patient: Ryan Lynch is an 41 y.o., male MRN: [id number] DOB: 1981/01/27 Height: _0  (180.3 cm) Weight: 91.6 kg  Insurance Information HMO:     PPO: yes     PCP:      IPA:      80/20:      OTHER:  PRIMARY: BCBS      Policy#: [id number]      Subscriber: pt CM Name: Margreta Journey      Phone#: [phone]     Fax#: [phone] Pre-Cert#: [id number]    approved until 9/11 when updates are due  Employer:  Benefits:  Phone #: [phone]     Name:  Eff. Date: 11/14/21     Deduct: $1800      Out of Pocket Max: $9100      Life Max: n/a CIR: 70%     SNF: 70% 60 days Outpatient: $40     Co-Pay: 30 combined visits Home Health: 70%      Co-Pay: 30% DME: 70%     Co-Pay: 30% Providers: in network  SECONDARY:       Policy#:      Phone#:   Development worker, community:       Phone#:   The Engineer, petroleum" for patients in Inpatient Rehabilitation Facilities with attached "Privacy Act [facility] Records" was provided and verbally reviewed with: N/A  Emergency Contact Information Contact Information     Name Relation Home Work Mobile   [place] Father   [phone]   HILDING, QUINTANAR  [phone]        Current Medical History  Patient Admitting Diagnosis: debility   History of Present Illness:  [name] is a 41 year old right-handed male with history of anxiety maintained on Klonopin as well as Zoloft as well as alcohol use.   Admitted 07/02/2022 after fall down approximately 8 steps.  Denied loss of consciousness.    Cranial CT scan showed broad-based right scalp hematoma without underlying skull fracture.  Negative noncontrast CT appearance of the brain.  CT cervical spine negative.  CT of the chest abdomen pelvis showed a T7 mild anterior vertebral body compression fracture with approximately 20% height loss.  No acute traumatic injury to the chest abdomen or pelvis.  Incidental finding of bilateral adrenal 16 mm left 10  mm right plan outpatient CT/MRI.  CT of the left tibia-fibula showed severely comminuted fracture of the lateral tibial plateau with 2 cm maximum depression of the articular surface with the depressed area measuring approximately 3 x 3.6 cm.  Fractures involves the medial lateral tibial eminence.  Nondisplaced intra-articular fracture of the posterior medial tibial plateau without significant depression.  Transverse fracture through the proximal tibial metaphysis without significant displacement as well as a large hemarthrosis..  Mild perifascial edema deep to the lateral gastrocnemius muscle with poor definition of the psoas muscle.  Admission chemistries unremarkable except glucose 135, AST 188, ALT 59, alcohol negative, lactic acid 2.4.  Patient underwent emergent 4 compartment fasciotomy left leg 07/02/2022 per Dr. Marcelino Scot with close reduction left tibial plateau/application of spanning external fixator left knee/aspiration of left knee joint.  Nonweightbearing left lower extremity in place cardiac regular knee brace unlocked and only needs to be on when out of bed..  Surgical course complicated by traumatic rhabdomyolysis CK of 1513 improved to 405. He was cleared to begin Lovenox for DVT prophylaxis.  Conservative care advised for T7 compression fracture.  Hospital course complicated by ileus 3/55 nasogastric tube placed diet slowly advance  patient had been on TNA.  Acute blood loss anemia 7.7 and monitored.  Course complicated by some agitation question alcohol withdrawal versus hepatic encephalopathy felt scenario most consistent with withdrawal and monitored.  Initial urinary retention patient has been placed on Flomax with plan to remove Foley tube.    Patient's medical record from [facility] has been reviewed by the rehabilitation admission coordinator and physician.  Past Medical History  History reviewed. No pertinent past medical history.  Has the patient had major surgery during 100 days prior to  admission? Yes  Family History   family history is not on file.  Current Medications  Current Facility-Administered Medications:    acetaminophen (TYLENOL) tablet 325-650 mg, 325-650 mg, Oral, Q6H PRN, Ainsley Spinner, PA-C   Chlorhexidine Gluconate Cloth 2 % PADS 6 each, 6 each, Topical, Daily, Ainsley Spinner, PA-C, 6 each at 07/15/22 1001   cholecalciferol (VITAMIN D3) 25 MCG (1000 UNIT) tablet 2,000 Units, 2,000 Units, Oral, BID, Ainsley Spinner, PA-C, 2,000 Units at 07/15/22 2058   clonazePAM (KLONOPIN) tablet 2 mg, 2 mg, Oral, BID, Nita Sells, MD, 2 mg at 07/15/22 2058   enoxaparin (LOVENOX) injection 40 mg, 40 mg, Subcutaneous, Daily, Elodia Florence., MD, 40 mg at 07/15/22 1001   feeding supplement (BOOST / RESOURCE BREEZE) liquid 1 Container, 1 Container, Oral, TID BM, Nita Sells, MD, 1 Container at 07/15/22 1308   HYDROmorphone (DILAUDID) injection 0.5-1 mg, 0.5-1 mg, Intravenous, Q4H PRN, Ainsley Spinner, PA-C, 1 mg at 07/16/22 0459   ondansetron (ZOFRAN) tablet 4 mg, 4 mg, Oral, Q6H PRN **OR** ondansetron (ZOFRAN) injection 4 mg, 4 mg, Intravenous, Q6H PRN, Ainsley Spinner, PA-C, 4 mg at 07/09/22 2706   oxyCODONE-acetaminophen (PERCOCET/ROXICET) 5-325 MG per tablet 1-2 tablet, 1-2 tablet, Oral, Q4H PRN, Nita Sells, MD, 2 tablet at 07/15/22 1941   sodium chloride flush (NS) 0.9 % injection 10-40 mL, 10-40 mL, Intracatheter, Q12H, Elodia Florence., MD, 10 mL at 07/15/22 2059   sodium chloride flush (NS) 0.9 % injection 10-40 mL, 10-40 mL, Intracatheter, PRN, Elodia Florence., MD   tamsulosin [facility]) capsule 0.4 mg, 0.4 mg, Oral, QPC supper, Verlon Au, Jai-Gurmukh, MD, 0.4 mg at 07/15/22 1820   TPN ADULT (ION), , Intravenous, Continuous TPN, Bell, Lorin C, RPH, Last Rate: 50 mL/hr at 07/15/22 1839, New Bag at 07/15/22 1839  Patients Current Diet:  Diet Order             Diet full liquid Room service appropriate? Yes; Fluid consistency: Thin  Diet  effective now                  Precautions / Restrictions Precautions Precautions: Fall Precaution Comments: watch vitals Other Brace: hinged knee brace, unlocked on when OOB able to d/c supine per ortho notes 8/28 Restrictions Weight Bearing Restrictions: Yes LLE Weight Bearing: Non weight bearing Other Position/Activity Restrictions: no ROM restrictions   Has the patient had 2 or more falls or a fall with injury in the past year? Yes  Prior Activity Level Community (5-7x/wk): independent, working FT from home, driving, no DME  Prior Functional Level Self Care: Did the patient need help bathing, dressing, using the toilet or eating? Independent  Indoor Mobility: Did the patient need assistance with walking from room to room (with or without device)? Independent  Stairs: Did the patient need assistance with internal or external stairs (with or without device)? Independent  Functional Cognition: Did the patient need help planning regular tasks such as  shopping or remembering to take medications? Independent  Patient Information Are you of Hispanic, Latino/a,or Spanish origin?: A. No, not of Hispanic, Latino/a, or Spanish origin What is your race?: A. White Do you need or want an interpreter to communicate with a doctor or health care staff?: 0. No  Patient's Response To:  Health Literacy and Transportation Is the patient able to respond to health literacy and transportation needs?: Yes Health Literacy - How often do you need to have someone help you when you read instructions, pamphlets, or other written material from your doctor or pharmacy?: Never In the past 12 months, has lack of transportation kept you from medical appointments or from getting medications?: No In the past 12 months, has lack of transportation kept you from meetings, work, or from getting things needed for daily living?: No  Development worker, international aid / [place] Devices/Equipment:  None Home Equipment: Crutches  Prior Device Use: Indicate devices/aids used by the patient prior to current illness, exacerbation or injury? None of the above  Current Functional Level Cognition  Overall Cognitive Status: Impaired/Different from baseline Current Attention Level: Selective Orientation Level: Oriented X4 Following Commands: Follows one step commands with increased time, Follows one step commands consistently, Follows multi-step commands inconsistently Safety/Judgement: Decreased awareness of safety, Decreased awareness of deficits General Comments: Pt following cues more appropriately and consistently today, seeming to carryover cues on how to safely manage RW. Decreased impulsivity, but continues to require cues to slow down at times.    Extremity Assessment (includes Sensation/Coordination)  Upper Extremity Assessment: Overall WFL for tasks assessed  Lower Extremity Assessment: Defer to PT evaluation LLE Deficits / Details: currently with bledsoe brace. pt able to help lift LLE to place in brace    ADLs  Overall ADL's : Needs assistance/impaired Eating/Feeding: NPO Eating/Feeding Details (indicate cue type and reason): noted to have roughly 1 inch from the nose marker on the tubing has descended from the tip of patient nose. Rn made aware of concern that tubing has externally progressed. Pt also noted to have a few clearing of throats and cough. RN voiced she will check placement Grooming: Modified independent, Wash/dry face, Sitting Upper Body Bathing: Min guard, Cueing for safety, Sitting Lower Body Bathing: Moderate assistance, Sitting/lateral leans Upper Body Dressing : Min guard, Sitting Lower Body Dressing: Total assistance Lower Body Dressing Details (indicate cue type and reason): total (A) to don bledsoe brace Toilet Transfer: Maximal assistance, Stand-pivot, BSC/3in1, Rolling walker (2 wheels) Toilet Transfer Details (indicate cue type and reason): pt  requires single step commands to progress from EOB to [facility] going toward the R side. pt with increased time and having pt repeat back the steps to ensure understanding prior to attempt. Pt with multiple lines and leads at this time Toileting- Water quality scientist and Hygiene: Total assistance Toileting - Clothing Manipulation Details (indicate cue type and reason): pt able to static standin NWB LLE for peri care by OT. pt returning to sitting on [facility] for OT to setup environment. Pt sit<>STand and recliner placed behind the pt for upright sitting in chair. Functional mobility during ADLs: +2 for safety/equipment, Minimal assistance, Rolling walker (2 wheels) General ADL Comments: Recommendation for two person (A) with RW to pivot toward R return to bed or dropping chair arm and sliding onto bed surface. pt is able to bridge buttock to (A)> pt in chair on chair alarm at this time    Mobility  Overal bed mobility: Needs Assistance Bed Mobility: Supine to Sit  Rolling: Supervision Supine to sit: HOB elevated, Supervision Sit to supine: Min assist General bed mobility comments: Extra time, HOB elevated    Transfers  Overall transfer level: Needs assistance Equipment used: Rolling walker (2 wheels) Transfers: Sit to/from Stand Sit to Stand: Min assist Bed to/from chair/wheelchair/BSC transfer type:: Step pivot Stand pivot transfers: Max assist Step pivot transfers: Mod assist General transfer comment: MinA to steady pt with power up to stand, 1x from EOB and 1x from recliner. Pt needing reminders to push up from sitting surface rather than pull on RW.    Ambulation / Gait / Stairs / Wheelchair Mobility  Ambulation/Gait Ambulation/Gait assistance: Mod assist, Min assist Gait Distance (Feet): 28 Feet (x2 bouts of ~20 ft > ~28 ft) Assistive device: Rolling walker (2 wheels) Gait Pattern/deviations:  (hop-to) General Gait Details: Pt following cues to slow down and keep RW on ground better today.  Needs cues and increased assistance for balance when turning and hopping posteriorly. Min-modA for stability and cues to remain within RW. Gait velocity: decreased Gait velocity interpretation: <1.31 ft/sec, indicative of household ambulator    Posture / Balance Dynamic Sitting Balance Sitting balance - Comments: Prefers UE support Balance Overall balance assessment: Needs assistance Sitting-balance support: No upper extremity supported, Feet supported, Bilateral upper extremity supported Sitting balance-Leahy Scale: Fair Sitting balance - Comments: Prefers UE support Standing balance support: Bilateral upper extremity supported, During functional activity, Reliant on assistive device for balance Standing balance-Leahy Scale: Poor Standing balance comment: Reliant on RW and up to [place] needs/care consideration    Previous Home Environment  Living Arrangements: Parent Available Help at Discharge: Family Type of Home: House Home Layout: Two level, Able to live on main level with bedroom/bathroom Home Access: Stairs to enter Entrance Stairs-Rails: Right, Left Entrance Stairs-Number of Steps: 3 Bathroom Shower/Tub: Multimedia programmer: Standard Home Care Services: No Additional Comments: reports hx of anxiety/depression  Discharge Living Setting Plans for Discharge Living Setting: Lives with (comment) (parents) Type of Home at Discharge: House Discharge Home Layout: Able to live on main level with bedroom/bathroom Discharge Home Access: Stairs to enter Entrance Stairs-Rails: Right, Left Entrance Stairs-Number of Steps: 3 Discharge Bathroom Shower/Tub: Walk-in shower Discharge Bathroom Toilet: Standard Discharge Bathroom Accessibility: Yes How Accessible: Accessible via walker Does the patient have any problems obtaining your medications?: No  Social/Family/Support Systems Anticipated Caregiver: parents, Legrand Como (dad) and Cecille Rubin (mom) Anticipated Caregiver's  Contact Information: Legrand Como [phone] Ability/Limitations of Caregiver: n/a Caregiver Availability: 24/7 Discharge Plan Discussed with Primary Caregiver: Yes Is Caregiver In Agreement with Plan?: Yes Does Caregiver/Family have Issues with Lodging/Transportation while Pt is in Rehab?: No  Goals Patient/Family Goal for Rehab: PT/OT supervision to mod I, SLP supervision Expected length of stay: 7-10 Additional Information: pt with ADD/anxiety and medications have been stopped during acute stay.  Will need to ensure he's on everything he needs to be on prior to d/c Pt/Family Agrees to Admission and willing to participate: Yes Program Orientation Provided & Reviewed with Pt/Caregiver Including Roles  & Responsibilities: Yes  Barriers to Discharge: Behavior, Insurance for SNF coverage  Decrease burden of Care through IP rehab admission: n/a  Possible need for SNF placement upon discharge: Not anticipated  Patient Condition: I have reviewed medical records from [facility], spoken with  [facility] team , and patient and family member. I met with patient at the bedside and discussed via phone for inpatient rehabilitation assessment.  Patient will benefit from ongoing PT, OT, and SLP,  can actively participate in 3 hours of therapy a day 5 days of the week, and can make measurable gains during the admission.  Patient will also benefit from the coordinated team approach during an Inpatient Acute Rehabilitation admission.  The patient will receive intensive therapy as well as Rehabilitation physician, nursing, social worker, and care management interventions.  Due to safety, skin/wound care, disease management, medication administration, pain management, and patient education the patient requires 24 hour a day rehabilitation nursing.  The patient is currently mod assist overall with mobility and basic ADLs.  Discharge setting and therapy post discharge at  home  is anticipated.  Patient has agreed to participate  in the Acute Inpatient Rehabilitation Program and will admit Saturday..   Preadmission Screen Completed By: Shann Medal, PT, DPT and  updates by Cleatrice Burke, 07/16/2022 9:56 AM ______________________________________________________________________   Discussed status with Dr. Dagoberto Ligas on 07/14/22 at 1500 and received approval for admission Saturday 07/15/22  Admission Coordinator: Shann Medal, PT, DPT and updates by Cleatrice Burke, RN, time 1500 Date 07/15/2022   Assessment/Plan: Diagnosis: Does the need for close, 24 hr/day Medical supervision in concert with the patient's rehab needs make it unreasonable for this patient to be served in a less intensive setting? Yes Co-Morbidities requiring supervision/potential complications: EtOh encephalopathy- improving; fall down stairs- rib fx's tibial palateau fx s/p ORIF and fasciootomy for compartment syndrome- urinary retention- needs voiding trial; anxiet-y on Klonopin Due to bladder management, bowel management, safety, skin/wound care, disease management, medication administration, pain management, and patient education, does the patient require 24 hr/day rehab nursing? Yes Does the patient require coordinated care of a physician, rehab nurse, PT, OT, and SLP to address physical and functional deficits in the context of the above medical diagnosis(es)? Yes Addressing deficits in the following areas: balance, endurance, locomotion, strength, transferring, bowel/bladder control, bathing, dressing, feeding, grooming, toileting, and cognition Can the patient actively participate in an intensive therapy program of at least 3 hrs of therapy 5 days a week? Yes The potential for patient to make measurable gains while on inpatient rehab is good Anticipated functional outcomes upon discharge from inpatient rehab: modified independent and supervision PT, modified independent and supervision OT, modified independent and supervision SLP Estimated  rehab length of stay to reach the above functional goals is: 7-10 days Anticipated discharge destination: Home 10. Overall Rehab/Functional Prognosis: good   MD Signature:

## 2022-07-13 NOTE — Progress Notes (Signed)
Occupational Therapy Treatment Patient Details Name: Ryan Lynch MRN: [id number] DOB: 1981-08-09 Today's Date: 07/13/2022   History of present illness 41 yo male admitted 07/02/22 following fall down the stairs with subsequent tibial plateua fx requiring 4 compartment fasciotomies and placement of ex fix. S/p ROIF tibial plateu fx 8/22. s/p I& D LLE and second closure of wound 07/07/22.  PMH includes anxiety, ACL repair, psoriasis and daily consumption of alcohol.   OT comments  Pt demonstrates BSC and then chair transfer this session. Pt without restraints at this time. Pt verbalized needing to be careful with NG tube and "I want to do it right so they will take it out". Pt motivated to progress oob and states "that was so much easier for me to get on that commode" with Max (A). Communicated two person (A) required for transfers only toward the R for safety with lines/ leads/ NG tube and NWB LLE.  Recommendation for CIR   Recommendations for follow up therapy are one component of a multi-disciplinary discharge planning process, led by the attending physician.  Recommendations may be updated based on patient status, additional functional criteria and insurance authorization.    Follow Up Recommendations  Acute inpatient rehab (3hours/day)    Assistance Recommended at Discharge Frequent or constant Supervision/Assistance  Patient can return home with the following  Two people to help with walking and/or transfers;Two people to help with bathing/dressing/bathroom;Assist for transportation   Equipment Recommendations  BSC/3in1;Wheelchair (measurements OT);Wheelchair cushion (measurements OT);Hospital bed    Recommendations for Other Services Rehab consult    Precautions / Restrictions Precautions Precautions: Fall Precaution Comments: watch HR / O2 NG tube to wall suction Required Braces or Orthoses: Other Brace Knee Immobilizer - Left: Other (comment) (when oob able to d/c supine per ortho  notes 8/28) Other Brace: hinged knee brace, unlocked on when OOB able to d/c supine per ortho notes 8/28 Restrictions Weight Bearing Restrictions: Yes LLE Weight Bearing: Non weight bearing Other Position/Activity Restrictions: no ROM restrictions       Mobility Bed Mobility Overal bed mobility: Needs Assistance Bed Mobility: Supine to Sit Rolling: Supervision   Supine to sit: Min guard     General bed mobility comments: pt requires (A) to manage the lines/ leads/ NG tube . pt with min (A) of LLE initially .pt using bil UE to use rails to progress to eob    Transfers Overall transfer level: Needs assistance Equipment used: Rolling walker (2 wheels) Transfers: Sit to/from Stand Sit to Stand: Mod assist Stand pivot transfers: Max assist         General transfer comment: pt able to come to standing mod (A) with unsteady balance initially. pt needs increased (A) for balance to stand pivot     Balance Overall balance assessment: Needs assistance Sitting-balance support: Bilateral upper extremity supported, Feet supported Sitting balance-Leahy Scale: Fair     Standing balance support: Bilateral upper extremity supported, During functional activity, Reliant on assistive device for balance Standing balance-Leahy Scale: Poor                             ADL either performed or assessed with clinical judgement   ADL Overall ADL's : Needs assistance/impaired Eating/Feeding: NPO                       Toilet Transfer: Maximal assistance;Stand-pivot;BSC/3in1;Rolling walker (2 wheels) Toilet Transfer Details (indicate cue type and reason): pt requires single  step commands to progress from EOB to [facility] going toward the R side. pt with increased time and having pt repeat back the steps to ensure understanding prior to attempt. Pt with multiple lines and leads at this time Toileting- Architect and Hygiene: Total assistance Toileting - Clothing  Manipulation Details (indicate cue type and reason): pt able to static standin NWB LLE for peri care by OT. pt returning to sitting on [facility] for OT to setup environment. Pt sit<>STand and recliner placed behind the pt for upright sitting in chair.       General ADL Comments: Recommendation for two person (A) with RW to pivot toward R return to bed or dropping chair arm and sliding onto bed surface. pt is able to bridge buttock to (A)> pt in chair on chair alarm at this time    Extremity/Trunk Assessment Upper Extremity Assessment Upper Extremity Assessment: Overall WFL for tasks assessed   Lower Extremity Assessment Lower Extremity Assessment: Defer to PT evaluation LLE Deficits / Details: currently with bledsoe brace. pt able to help lift LLE to place in brace        Vision       Perception     Praxis      Cognition Arousal/Alertness: Awake/alert Behavior During Therapy: Impulsive Overall Cognitive Status: Impaired/Different from baseline Area of Impairment: Safety/judgement, Awareness                         Safety/Judgement: Decreased awareness of safety Awareness: Emergent Problem Solving: Slow processing General Comments: pt following 2 step commands this session. pt showing more impulse control when asked to wait. pt able to progress with NWB status sustained        Exercises      Shoulder Instructions       General Comments Pt requires 3L [place] to sustain 91% FIO2. pt dropping to 88% sustaining at RA supine resting. pt noted to have increased volume of output in NG tube wall suction with mobilty. pt + void of brown stool. RN notified and will empty from [facility] per RN request.    Pertinent Vitals/ Pain       Pain Assessment Pain Assessment: Faces Faces Pain Scale: Hurts little more Pain Location: LLE Pain Descriptors / Indicators: Sore Pain Intervention(s): Monitored during session, Repositioned  Home Living                                           Prior Functioning/Environment              Frequency  Min 2X/week        Progress Toward Goals  OT Goals(current goals can now be found in the care plan section)  Progress towards OT goals: Progressing toward goals  Acute Rehab OT Goals Patient Stated Goal: i have to have help before i move- pt showing awareness and communicating staff (A) needs OT Goal Formulation: With patient Time For Goal Achievement: 07/17/22 Potential to Achieve Goals: Good ADL Goals Pt Will Perform Lower Body Bathing: with set-up;with adaptive equipment Pt Will Perform Lower Body Dressing: with set-up;with adaptive equipment Pt Will Transfer to Toilet: with set-up;bedside commode Pt Will Perform Toileting - Clothing Manipulation and hygiene: with set-up;sit to/from stand  Plan Discharge plan remains appropriate    Co-evaluation  AM-PAC OT "6 Clicks" Daily Activity     Outcome Measure   Help from another person eating meals?: A Little Help from another person taking care of personal grooming?: A Little Help from another person toileting, which includes using toliet, bedpan, or urinal?: A Lot Help from another person bathing (including washing, rinsing, drying)?: A Lot Help from another person to put on and taking off regular upper body clothing?: A Little Help from another person to put on and taking off regular lower body clothing?: A Lot 6 Click Score: 15    End of Session Equipment Utilized During Treatment: Gait belt;Rolling walker (2 wheels)  OT Visit Diagnosis: Unsteadiness on feet (R26.81);Other abnormalities of gait and mobility (R26.89);Other symptoms and signs involving cognitive function   Activity Tolerance Patient tolerated treatment well   Patient Left in chair;with call bell/phone within reach;with chair alarm set   Nurse Communication Mobility status;Precautions;Weight bearing status        Time: 0905 [phone] OT Time Calculation  (min): 30 min  Charges: OT General Charges $OT Visit: 1 Visit OT Treatments $Self Care/Home Management : 23-37 mins   Brynn, OTR/L  Acute Rehabilitation Services Office: [phone] .   Mateo Flow 07/13/2022, 9:46 AM

## 2022-07-13 NOTE — TOC Progression Note (Signed)
Transition of Care [facility]) - Progression Note    Patient Details  Name: Issacc Merlo MRN: [id number] Date of Birth: Oct 01, 1981  Transition of Care [facility]) CM/SW Contact  Beckie Busing, RN Phone Number:[phone]  07/13/2022, 8:59 AM  Clinical Narrative:    TOC continues to follow for disposition planning. Will await final plan before ordering DME.        Expected Discharge Plan and Services                                                 Social Determinants of Health (SDOH) Interventions    Readmission Risk Interventions     No data to display

## 2022-07-13 NOTE — Progress Notes (Signed)
Called and spoke with Apolonio Schneiders RN to notify that patient's PICC drsg. needs changed ASAP d/t opened up into the clear area. RN VU. Tomasita Morrow, RN VAST

## 2022-07-13 NOTE — Progress Notes (Addendum)
Pt pulled out NG tube.  Kelly with trauma advised.  Will leave NG tube out for now and monitor for N/V with ice chips.  If N/V occurs will reinsert NG tube per Barnetta Chapel.  Pt placed back in mits due to confusion.

## 2022-07-13 NOTE — Progress Notes (Signed)
PHARMACY - TOTAL PARENTERAL NUTRITION CONSULT NOTE   Indication: Small bowel obstruction  Patient Measurements: Height: 5\' 11"  (180.3 cm) Weight: 91.6 kg (201 lb 15.1 oz) IBW/kg (Calculated) : 75.3 TPN AdjBW (KG): 91.6 Body mass index is 28.17 kg/m. Usual Weight: 190 lbs  Assessment: 41 yo male presented on 07/02/2022 for fall, found to have tibial plateau fracture with compartment syndrome s/p reduction and fasciotomy on 8/19 with Ortho. Patient now with concern for ileus vs. SBO on imaging, although patient has had multiple BMs. Surgery consulted. Pharmacy consulted to manage TPN. Patient noted to have developed moderate alcohol withdrawal symptoms.  Glucose / Insulin: no hx of DM. CBGs controlled on low end. Utilized 2 units SSI since initiation 8/28 PM Electrolytes: K 3.9>4.5 (s/p KCl 9/28 total yesterday), CoCa 10.3, others WNL Goal K >/=4 and Mag >/=2 with ileus Renal: Scr <1 stable. BUN WNL  Hepatic: LFTs WNL. Tbili up to 1.4, TG 155. Last prealbumin 20 on 8/21. Albumin 1.9  Intake / Output; MIVF: LBM 8/28, UOP 1.9 ml/kg/hr, 0 mL NGT output; MIVF: LR 25 GI Imaging:  8/28 xray: slight worsening small bowel dilatation 8/29: passed SBP protocol with contrast in colon and rectum GI Surgeries / Procedures: none since TPN start  Central access: 8/27 TPN start date: 8/28  Nutritional Goals: Goal TPN rate is 100 mL/hr (provides 130 g of protein and 2486 kcals per day)  RD Assessment: Estimated Needs Total Energy Estimated Needs: 2400-2600 Total Protein Estimated Needs: 120-135g Total Fluid Estimated Needs: >/=2L  Current Nutrition:  NPO and TPN Plan for 8/30 to advance to [facility]  Plan:  Continue TPN at goal rate of 100 mL/hr at 1800 TPN will provide 130g of protein and 2486 kcal, meeting 100% of needs Electrolytes in TPN: Na 74mEq/L, K 67mEq/L, Ca 79mEq/L, Mg 12mEq/L, and Phos 68mmol/L. Cl:Ac max acetate D/c PO MVI/zinc until able to take PO medications Add standard MVI and  trace elements, folic acid 1mg  to TPN Add thiamine 250mg  daily x 5 days (last day 8/31), then 100mg  daily to TPN Discontinue Sensitive q6h SSI given minimal use Continue MVIF (LR) at 25 mL/hr Monitor standard TPN labs on Mon/Thurs and PRN  , PharmD Clinical Pharmacist 07/13/2022 9:19 AM

## 2022-07-13 NOTE — Progress Notes (Signed)
General Surgery Follow Up Note  Subjective:    Overnight Issues: still having BMs.  Wanting to eat.  Objective:  Vital signs for last 24 hours: Temp:  [97.7 F (36.5 C)-98.8 F (37.1 C)] 98.8 F (37.1 C) (08/30 0742) Pulse Rate:  [77-106] 106 (08/30 0742) Resp:  [11-15] 15 (08/30 0742) BP: (135-161)/(68-79) 140/79 (08/30 0742) SpO2:  [93 %-100 %] 93 % (08/30 0742)   Intake/Output from previous day: 08/29 0701 - 08/30 0700 In: -  Out: 4150 [Urine:4150]  Intake/Output this shift: Total I/O In: -  Out: 1000 [Urine:1000]  Physical Exam:  Gen: comfortable, no distress Pulm: unlabored breathing Abd: soft, NT, slightly distention, NGT with 700cc of output yesterday.  All bilious   Results for orders placed or performed during the hospital encounter of 07/02/22 (from the past 24 hour(s))  Glucose, capillary     Status: Abnormal   Collection Time: 07/12/22 11:39 AM  Result Value Ref Range   Glucose-Capillary 123 (H) 70 - 99 mg/dL  Glucose, capillary     Status: Abnormal   Collection Time: 07/12/22  3:12 PM  Result Value Ref Range   Glucose-Capillary 120 (H) 70 - 99 mg/dL  Hemoglobin and hematocrit, blood     Status: Abnormal   Collection Time: 07/12/22  3:41 PM  Result Value Ref Range   Hemoglobin 7.4 (L) 13.0 - 17.0 g/dL   HCT 51.0 (L) 25.8 - 52.7 %  Type and screen [facility]     Status: None   Collection Time: 07/12/22  3:51 PM  Result Value Ref Range   ABO/RH(D) O POS    Antibody Screen NEG    Sample Expiration      07/15/2022,2359 Performed at [facility], [address] [place], [place] [address]   Glucose, capillary     Status: Abnormal   Collection Time: 07/12/22  7:18 PM  Result Value Ref Range   Glucose-Capillary 110 (H) 70 - 99 mg/dL  Glucose, capillary     Status: Abnormal   Collection Time: 07/12/22 11:06 PM  Result Value Ref Range   Glucose-Capillary 124 (H) 70 - 99 mg/dL  Glucose, capillary     Status: Abnormal    Collection Time: 07/13/22  3:18 AM  Result Value Ref Range   Glucose-Capillary 116 (H) 70 - 99 mg/dL  Basic metabolic panel     Status: Abnormal   Collection Time: 07/13/22  3:46 AM  Result Value Ref Range   Sodium 142 135 - 145 mmol/L   Potassium 4.5 3.5 - 5.1 mmol/L   Chloride 107 98 - 111 mmol/L   CO2 26 22 - 32 mmol/L   Glucose, Bld 107 (H) 70 - 99 mg/dL   BUN 6 6 - 20 mg/dL   Creatinine, Ser 3.53 (L) 0.61 - 1.24 mg/dL   Calcium 8.6 (L) 8.9 - 10.3 mg/dL   GFR, Estimated >61 >44 mL/min   Anion gap 9 5 - 15  Glucose, capillary     Status: Abnormal   Collection Time: 07/13/22  7:59 AM  Result Value Ref Range   Glucose-Capillary 132 (H) 70 - 99 mg/dL    Assessment & Plan: The plan of care was discussed with the bedside nurse for the day, who is in agreement with this plan and no additional concerns were raised.   Present on Admission:  Compartment syndrome of left lower extremity, initial encounter ([name])    LOS: 11 days   Additional comments:I reviewed the  patient's new clinical lab test results.   and I reviewed the patients new imaging test results.   Discussed patient with RN, reviewed notes by ortho, medicine   Ileus -passed SBO protocol with contrast in his colon and rectum. -ngt was around 700cc yesterday RN believes.  He is having bowel function.  Will try to clamp his NGT today and see how he tolerates this.  If he develops N/V, then return NGT to LIWS. -mobilize as able -mag> 2, K >4.  K much better today  FEN - NGT, clamped/NPO/IVFs ID - none currently needed from our standpoint, unasyn per medicine VTE - lovenox  Status post fall with tibial plateau fracture and compartment syndrome Status post fasciotomy and Ex-Fix EtOH abuse Transaminitis - resolved Thrombocytopenia Acute blood loss anemia-stable Acute kidney injury-resolved Elevated total bilirubin-1.4 Elevated alkaline phosphatase level - suspect overall LFTs somewhat related to liver dysfunction  given ETOH use, etc Bilateral adrenal nodules-agree with outpatient follow-up Compression fracture of T7   Letha Cape, PA-C Please use [email or web address] to contact on call provider  07/13/2022

## 2022-07-13 NOTE — Progress Notes (Addendum)
Physical Therapy Treatment Patient Details Name: Ryan Lynch MRN: [id number] DOB: 1981-10-21 Today's Date: 07/13/2022   History of Present Illness 41 yo male admitted 07/02/22 following fall down the stairs with subsequent tibial plateua fx requiring 4 compartment fasciotomies and placement of ex fix. S/p ROIF tibial plateu fx 8/22. s/p I& D LLE and second closure of wound 07/07/22.  PMH includes anxiety, ACL repair, psoriasis and daily consumption of alcohol.    PT Comments    Attempted session 3x today before successful. Upon arrival, pt was diaphoretic and trembling, but communicating with VSS. RN aware. Pt agreeable to bed level exercises to address his deficits in L lower extremity ROM and strength, but declined EOB/OOB mobility due to the symptoms mentioned above and pt not feeling well. Pt reports he will get up tomorrow with PT. He demonstrates stiffness in his L knee, limiting full knee extension and knee flexion > ~35 degrees. Positioned pt with L knee in extension end of session. Will continue to follow acutely. Current recommendations remain appropriate.     Recommendations for follow up therapy are one component of a multi-disciplinary discharge planning process, led by the attending physician.  Recommendations may be updated based on patient status, additional functional criteria and insurance authorization.  Follow Up Recommendations  Acute inpatient rehab (3hours/day)     Assistance Recommended at Discharge Frequent or constant Supervision/Assistance  Patient can return home with the following A little help with walking and/or transfers;A little help with bathing/dressing/bathroom;Assistance with cooking/housework;Assist for transportation;Help with stairs or ramp for entrance;Direct supervision/assist for medications management;Direct supervision/assist for financial management   Equipment Recommendations  Rolling walker (2 wheels);BSC/3in1;Wheelchair (measurements  PT);Wheelchair cushion (measurements PT)    Recommendations for Other Services       Precautions / Restrictions Precautions Precautions: Fall Precaution Comments: watch HR/O2/BP, mittens Required Braces or Orthoses: Other Brace Knee Immobilizer - Left: Other (comment) (on when OOB able to d/c supine per ortho notes 8/28) Other Brace: hinged knee brace, unlocked on when OOB able to d/c supine per ortho notes 8/28 Restrictions Weight Bearing Restrictions: Yes LLE Weight Bearing: Non weight bearing Other Position/Activity Restrictions: no ROM restrictions     Mobility  Bed Mobility               General bed mobility comments: Pt declined OOB mobility due to not feeling well. Noted pt to be diaphoretic and shaking upon arrival, but communicating and VSS.    Transfers                   General transfer comment: Pt declined OOB mobility due to not feeling well. Noted pt to be diaphoretic and shaking upon arrival, but communicating and VSS.    Ambulation/Gait               General Gait Details: Pt declined OOB mobility due to not feeling well. Noted pt to be diaphoretic and shaking upon arrival, but communicating and VSS.   Stairs             Wheelchair Mobility    Modified Rankin (Stroke Patients Only)       Balance                                            Cognition Arousal/Alertness: Awake/alert Behavior During Therapy: Impulsive, Restless Overall Cognitive Status: Impaired/Different from baseline Area of Impairment: Safety/judgement,  Following commands, Attention, Awareness, Memory, Problem solving                   Current Attention Level: Sustained Memory: Decreased recall of precautions, Decreased short-term memory Following Commands: Follows one step commands consistently, Follows one step commands with increased time Safety/Judgement: Decreased awareness of safety, Decreased awareness of  deficits Awareness: Emergent Problem Solving: Slow processing General Comments: Pt needing repeated education on importance of mobility and ROM. Pt reporting he is not doing well today and thus cannot get OOB but is agreeable to bed level exercises and progressing OOB tomorrow. Pt was diaphoretic and shaking but talking upon arrival, RN aware. Pt frogetful of need to position leg with knee extended to reduce risk of contractures.        Exercises General Exercises - Lower Extremity Ankle Circles/Pumps: Both, AAROM, 10 reps, Seated, Other (comment) (bed in chair-like position) Short Arc Quad: AROM, Left, 10 reps, Seated, Other (comment) (bed in chair-like position) Heel Slides: AAROM, Left, Other reps (comment), Seated (bed in chair-like position; x3 reps) Hip ABduction/ADduction: AAROM, Both, Other reps (comment), Seated (bed in chair-like position; x3 reps) Other Exercises Other Exercises: knee extension PROM stretch allowing gravity to bring knee into extension with distal aspect of leg resting on therapist's hand Other Exercises: knee flexion PROM stretch allowing gravity to bring knee into flexion with knee resting on therapist's hand    General Comments General comments (skin integrity, edema, etc.): VSS on RA      Pertinent Vitals/Pain Pain Assessment Pain Assessment: Faces Faces Pain Scale: Hurts whole lot Pain Location: L Leg, especially in dependent position Pain Descriptors / Indicators: Operative site guarding, Discomfort, Grimacing, Guarding Pain Intervention(s): Limited activity within patient's tolerance, Monitored during session, Repositioned    Home Living                          Prior Function            PT Goals (current goals can now be found in the care plan section) Acute Rehab PT Goals Patient Stated Goal: to get better PT Goal Formulation: With patient Time For Goal Achievement: 07/17/22 Potential to Achieve Goals: Good Progress towards PT  goals: Not progressing toward goals - comment (limited by pain)    Frequency    Min 5X/week      PT Plan Current plan remains appropriate    Co-evaluation              AM-PAC PT "6 Clicks" Mobility   Outcome Measure  Help needed turning from your back to your side while in a flat bed without using bedrails?: A Little Help needed moving from lying on your back to sitting on the side of a flat bed without using bedrails?: A Little Help needed moving to and from a bed to a chair (including a wheelchair)?: A Little Help needed standing up from a chair using your arms (e.g., wheelchair or bedside chair)?: A Little Help needed to walk in hospital room?: Total Help needed climbing 3-5 steps with a railing? : Total 6 Click Score: 14    End of Session   Activity Tolerance: Patient limited by pain;Other (comment) (not feeling well) Patient left: in bed;with call bell/phone within reach;with bed alarm set;with restraints reapplied Nurse Communication: Mobility status;Other (comment) (vitals) PT Visit Diagnosis: Unsteadiness on feet (R26.81);Difficulty in walking, not elsewhere classified (R26.2);Other abnormalities of gait and mobility (R26.89);Pain Pain - Right/Left: Left Pain -  part of body: Leg     Time: 9485-4627 PT Time Calculation (min) (ACUTE ONLY): 13 min  Charges:  $Therapeutic Exercise: 8-22 mins                     Raymond Gurney, PT, DPT Acute Rehabilitation Services  Office: [phone]    Jewel Baize 07/13/2022, 4:23 PM

## 2022-07-13 NOTE — Progress Notes (Signed)
Inpatient Rehab Admissions Coordinator:   Note pt pulled NG out today, possibly able to leave out if he tolerates ice chips without N/V.  Will follow for that.  I was able to speak to pt's father to confirm dispo and he and spouse are on board with potential CIR admit.  I reviewed need for insurance auth and we will request that once it's clear he's tolerating a diet.  Will continue to follow.   Estill Dooms, PT, DPT Admissions Coordinator [phone] 07/13/22  1:02 PM

## 2022-07-14 DIAGNOSIS — Z4889 Encounter for other specified surgical aftercare: Secondary | ICD-10-CM | POA: Diagnosis not present

## 2022-07-14 LAB — CBC
HCT: 26.4 % — ABNORMAL LOW (ref 39.0–52.0)
Hemoglobin: 8.2 g/dL — ABNORMAL LOW (ref 13.0–17.0)
MCH: 27.8 pg (ref 26.0–34.0)
MCHC: 31.1 g/dL (ref 30.0–36.0)
MCV: 89.5 fL (ref 80.0–100.0)
Platelets: 157 10*3/uL (ref 150–400)
RBC: 2.95 MIL/uL — ABNORMAL LOW (ref 4.22–5.81)
RDW: 16.5 % — ABNORMAL HIGH (ref 11.5–15.5)
WBC: 6.7 10*3/uL (ref 4.0–10.5)
nRBC: 0.7 % — ABNORMAL HIGH (ref 0.0–0.2)

## 2022-07-14 LAB — GLUCOSE, CAPILLARY
Glucose-Capillary: 126 mg/dL — ABNORMAL HIGH (ref 70–99)
Glucose-Capillary: 128 mg/dL — ABNORMAL HIGH (ref 70–99)
Glucose-Capillary: 133 mg/dL — ABNORMAL HIGH (ref 70–99)
Glucose-Capillary: 134 mg/dL — ABNORMAL HIGH (ref 70–99)
Glucose-Capillary: 146 mg/dL — ABNORMAL HIGH (ref 70–99)

## 2022-07-14 LAB — CULTURE, BLOOD (ROUTINE X 2)
Culture: NO GROWTH
Culture: NO GROWTH
Special Requests: ADEQUATE

## 2022-07-14 LAB — MAGNESIUM: Magnesium: 2.1 mg/dL (ref 1.7–2.4)

## 2022-07-14 LAB — COMPREHENSIVE METABOLIC PANEL
ALT: 27 U/L (ref 0–44)
AST: 37 U/L (ref 15–41)
Albumin: 2.6 g/dL — ABNORMAL LOW (ref 3.5–5.0)
Alkaline Phosphatase: 324 U/L — ABNORMAL HIGH (ref 38–126)
Anion gap: 8 (ref 5–15)
BUN: 8 mg/dL (ref 6–20)
CO2: 25 mmol/L (ref 22–32)
Calcium: 8.6 mg/dL — ABNORMAL LOW (ref 8.9–10.3)
Chloride: 103 mmol/L (ref 98–111)
Creatinine, Ser: 0.67 mg/dL (ref 0.61–1.24)
GFR, Estimated: >60 mL/min (ref >60)
Glucose, Bld: 138 mg/dL — ABNORMAL HIGH (ref 70–99)
Potassium: 3.9 mmol/L (ref 3.5–5.1)
Sodium: 136 mmol/L (ref 135–145)
Total Bilirubin: 1.5 mg/dL — ABNORMAL HIGH (ref 0.3–1.2)
Total Protein: 6 g/dL — ABNORMAL LOW (ref 6.5–8.1)

## 2022-07-14 LAB — PHOSPHORUS: Phosphorus: 3.9 mg/dL (ref 2.5–4.6)

## 2022-07-14 LAB — TRIGLYCERIDES: Triglycerides: 151 mg/dL — ABNORMAL HIGH (ref <150)

## 2022-07-14 MED ORDER — BOOST / RESOURCE BREEZE PO LIQD CUSTOM
1.0000 | Freq: Three times a day (TID) | ORAL | Status: DC
  Administered 2022-07-14 – 2022-07-16 (×5): 1 via ORAL

## 2022-07-14 MED ORDER — TRAVASOL 10 % IV SOLN
INTRAVENOUS | Status: AC
  Filled 2022-07-14: qty 1296

## 2022-07-14 MED ORDER — CLONAZEPAM 1 MG PO TABS
2.0000 mg | ORAL_TABLET | Freq: Two times a day (BID) | ORAL | Status: DC
  Administered 2022-07-14 – 2022-07-16 (×5): 2 mg via ORAL
  Filled 2022-07-14 (×5): qty 2

## 2022-07-14 MED ORDER — TAMSULOSIN HCL 0.4 MG PO CAPS
0.4000 mg | ORAL_CAPSULE | Freq: Every day | ORAL | Status: DC
  Administered 2022-07-14 – 2022-07-15 (×2): 0.4 mg via ORAL
  Filled 2022-07-14 (×2): qty 1

## 2022-07-14 MED ORDER — SODIUM CHLORIDE 0.9 % IV SOLN
INTRAVENOUS | Status: DC
Start: 2022-07-14 — End: 2022-07-14

## 2022-07-14 MED ORDER — OXYCODONE-ACETAMINOPHEN 5-325 MG PO TABS
1.0000 | ORAL_TABLET | ORAL | Status: DC | PRN
  Administered 2022-07-14 – 2022-07-16 (×6): 2 via ORAL
  Filled 2022-07-14 (×6): qty 2

## 2022-07-14 NOTE — Progress Notes (Signed)
Nutrition Follow-up  DOCUMENTATION CODES:   Not applicable  INTERVENTION:   TPN meeting nutrition needs  Boost Breeze po TID, each supplement provides 250 kcal and 9 grams of protein   NUTRITION DIAGNOSIS:   Increased nutrient needs related to acute illness, post-op healing as evidenced by estimated needs. Ongoing  GOAL:   Patient will meet greater than or equal to 90% of their needs Progressing   MONITOR:   PO intake, Supplement acceptance, Labs, Weight trends, I & O's, Skin  REASON FOR ASSESSMENT:   Consult Assessment of nutrition requirement/status  ASSESSMENT:   Pt admitted after a fall leading to L bicondylar tibial plateau fracture s/p external fixation and acute compartment syndrome s/p 4 compartment fasciotomies. PMH significant for alcohol use  NG removed by pt but has tolerated with no N/V, had BM.  Plan for clear liquids today.    8/22: s/p ORIF L tibial plateau 8/24: s/p I&D extremity with secondary closure of wound 8/28: started TPN  Medications reviewed and include: Vitamin D3, lactulose  Labs: TG: 151    Diet Order:   Diet Order             Diet clear liquid Room service appropriate? No; Fluid consistency: Thin  Diet effective now                   EDUCATION NEEDS:   No education needs have been identified at this time  Skin:  Skin Assessment: Skin Integrity Issues: Skin Integrity Issues:: Incisions Incisions: L leg (closed)  Last BM:  8/31 x 2 (large and medium type 7)  Height:   Ht Readings from Last 1 Encounters:  07/03/22 5\' 11"  (1.803 m)    Weight:   Wt Readings from Last 1 Encounters:  07/03/22 91.6 kg   BMI:  Body mass index is 28.17 kg/m.  Estimated Nutritional Needs:   Kcal:  2400-2600  Protein:  120-135g  Fluid:  >/=2L  Lylah Lantis P., RD, LDN, CNSC See AMiON for contact information

## 2022-07-14 NOTE — Progress Notes (Addendum)
PROGRESS NOTE   Ryan Lynch  PFX:[id number] DOB: 1981-07-05 DOA: 07/02/2022 PCP: Patient, No Pcp Per  Brief Narrative:  41 year old male known EtOH plan drinks [4-10 drinks per day] anxiety ADHD 8/19 fal- fall downstairs tibial plateau fracture compartment syndrome-emergent fasciotomy Dr. Carola Frost in OR--also T7 compression fracture as well as multiple 6 through 9 rib fractures 8/21-developed further distention of abdomen pelvis and found to have ileus 8/25 developed nausea vomiting tachypnea tachycardia no fever Unasyn initiated for fever of unknown origin  Hospitalization complicated by metabolic encephalopathy and apparent tachycardia concerning for fever  Hospital-Problem based course  Fall leading to polytrauma rib fractures, T7 compression fractures Tibial plateau fracture status post closed reduction left tibia fracture with fasciotomy 07/02/2022 left lower extremity  pain control--therapy recommendations and management as per Ortho trauma Tylenol 325-650 every 6 as needed mild pain, Dilaudid 0.5-1 every 4 as needed severe pain  Traumatic rhabdomyolysis CK of 1513 and resolved-fluids d/c  Alcohol withdrawal versus hepatic encephalopathy Much improved mentation Feel that scenario more consistent with withdrawal off ZD--LFT,PLT all point against significant cirrhosis Place on home dose klonopin 2 bid and monitor mentation  Ileus/possible SBO This developed on 8/21-resolved?pulled out his NG tube 8/30 --per gen surg start with clears grad as tol TPN can likely be d/c in am if able to eat food  Acute urinary retention Flomax to be given and try voiding trial in the next several days  Bilateral adrenal 9mm left, 10 mm right needs outpatient CT MRI  Underlying anxiety Resumed Librium 5 twice daily as above  Consumption coaglupathy with thrombocytopenia and transaminitis improving  Anemia likely of combine blood loss and cytopenia from steatosis Transfusion threshold is less  than 7-monitor trends and replace normal Iron 18 saturation ratios 5 , s/p IV iron x 1 8/30   DVT prophylaxis: SCDs Code Status: Full Family Communication: None present Disposition:  Status is: Inpatient Remains inpatient appropriate because:   Stabilizing--awaiting decision re:CIR   Consultants:  General surgery Ortho trauma  Procedures:   Antimicrobials: Unasyn until 8/30   Subjective:  Well less diaphoretic No diaphoresis except for when he worked with Therapy today  2 stool today Diet has been grad  Objective: Vitals:   07/13/22 2301 07/14/22 0325 07/14/22 0702 07/14/22 1100  BP: (!) 130/59 (!) 159/72 126/69 (!) 152/74  Pulse: 94 (!) 102 99 96  Resp:  20 20 17   Temp: 98.2 F (36.8 C) 98.4 F (36.9 C) 98.7 F (37.1 C) 97.7 F (36.5 C)  TempSrc: Oral Oral Oral Oral  SpO2:  91% 94% 95%  Weight:      Height:        Intake/Output Summary (Last 24 hours) at 07/14/2022 1443 Last data filed at 07/14/2022 1100 Gross per 24 hour  Intake --  Output 2800 ml  Net -2800 ml    Filed Weights   07/02/22 1300 07/03/22 2222  Weight: 86.2 kg 91.6 kg    Examination:  EOMI NCAT no icterus no pallor moving 4 limbs S1-S2 no murmur Chest is clear no rhonchi no wheeze Slightly distended no rebound no guarding No lower extremity edema somewhat discoloration suggestive postop changes to his lower extremity 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 Mild tremor  Data Reviewed: personally reviewed   CBC    Component Value Date/Time   WBC 6.7 07/14/2022 0605   RBC 2.95 (L) 07/14/2022 0605   HGB 8.2 (L) 07/14/2022 07/16/2022  HCT 26.4 (L) 07/14/2022 0605   PLT 157 07/14/2022 0605   MCV 89.5 07/14/2022 0605   MCH 27.8 07/14/2022 0605   MCHC 31.1 07/14/2022 0605   RDW 16.5 (H) 07/14/2022 0605   LYMPHSABS 0.8 07/12/2022 0137   MONOABS 0.3 07/12/2022 0137   EOSABS 0.1 07/12/2022 0137   BASOSABS 0.0 07/12/2022 0137      Latest  Ref Rng & Units 07/14/2022    6:05 AM 07/13/2022    3:46 AM 07/12/2022    1:37 AM  CMP  Glucose 70 - 99 mg/dL 109  323  557   BUN 6 - 20 mg/dL 8  6  5    Creatinine 0.61 - 1.24 mg/dL  3.22  0.25   Sodium 135 - 145 mmol/L 136  142  143   Potassium 3.5 - 5.1 mmol/L 3.9  4.5  3.9   Chloride 98 - 111 mmol/L 103  107  116   CO2 22 - 32 mmol/L 25  26  23    Calcium 8.9 - 10.3 mg/dL 8.6  8.6  7.6   Total Protein 6.5 - 8.1 g/dL 6.0   4.6   Total Bilirubin 0.3 - 1.2 mg/dL 1.5   1.4   Alkaline Phos 38 - 126 U/L 324   150   AST 15 - 41 U/L 37   26   ALT 0 - 44 U/L 27   20      Radiology Studies: No results found.   Scheduled Meds:  Chlorhexidine Gluconate Cloth  6 each Topical Daily   cholecalciferol  2,000 Units Oral BID   clonazePAM  2 mg Oral BID   enoxaparin (LOVENOX) injection  40 mg Subcutaneous Daily   feeding supplement  1 Container Oral TID BM   lactulose  20 g Oral Daily   sodium chloride flush  10-40 mL Intracatheter Q12H   Continuous Infusions:  sodium chloride 25 mL/hr at 07/14/22 1200   TPN ADULT (ION) 100 mL/hr at 07/13/22 1750   TPN ADULT (ION)       LOS: 12 days   Time spent: 75  07/15/22, MD [facility] To contact the attending provider between 7A-7P or the covering provider during after hours 7P-7A, please log into the web site [email or web address] and access using universal [place] password for that web site. If you do not have the password, please call the hospital operator.  07/14/2022, 2:43 PM

## 2022-07-14 NOTE — Progress Notes (Signed)
PHARMACY - TOTAL PARENTERAL NUTRITION CONSULT NOTE   Indication: Small bowel obstruction  Patient Measurements: Height: 5\' 11"  (180.3 cm) Weight: 91.6 kg (201 lb 15.1 oz) IBW/kg (Calculated) : 75.3 TPN AdjBW (KG): 91.6 Body mass index is 28.17 kg/m. Usual Weight: 190 lbs  Assessment: 41 yo male presented on 07/02/2022 for fall, found to have tibial plateau fracture with compartment syndrome s/p reduction and fasciotomy on 8/19 with Ortho. Patient now with concern for ileus vs. SBO on imaging, although patient has had multiple BMs. Surgery consulted. Pharmacy consulted to manage TPN. Patient noted to have developed moderate alcohol withdrawal symptoms.  Glucose / Insulin: no hx of DM. CBGs <140. Off SSI since 8/30. Electrolytes: K 4.5>3.9, CoCa 9.7, others WNL Goal K >/=4 and Mag >/=2 with ileus Renal: Scr <1 stable. BUN WNL  Hepatic: LFTs WNL. Tbili up to 1.5, TG 151. Last prealbumin 20 on 8/21. Albumin 2.6 Intake / Output; MIVF: LBM 8/31, UOP 1.6 ml/kg/hr, MIVF: LR 25 GI Imaging:  8/28 xray: slight worsening small bowel dilatation 8/29: passed SBP protocol with contrast in colon and rectum GI Surgeries / Procedures: none since TPN start  Central access: 8/27 TPN start date: 8/28  Nutritional Goals: Goal TPN rate is 100 mL/hr (provides 130 g of protein and 2486 kcals per day)  RD Assessment: Estimated Needs Total Energy Estimated Needs: 2400-2600 Total Protein Estimated Needs: 120-135g Total Fluid Estimated Needs: >/=2L  Current Nutrition:  NPO and TPN Plan to advance to CLD  Plan:  Continue TPN at goal rate of 100 mL/hr at 1800 TPN will provide 130g of protein and 2486 kcal, meeting 100% of needs Electrolytes in TPN: Na 85mEq/L, Increase K 97mEq/L, Keep: Ca 75mEq/L, Mg 108mEq/L, and Phos 16mmol/L. Cl:Ac max acetate D/c PO MVI/zinc until able to take PO medications Add standard MVI and trace elements, folic acid 1mg  to TPN Add thiamine 250mg  daily x 5 days (last day 8/31),  then 100mg  daily to TPN Change MVIF to NS at 25 mL/hr Monitor standard TPN labs on Mon/Thurs and PRN  , PharmD Clinical Pharmacist 07/14/2022 10:22 AM

## 2022-07-14 NOTE — Progress Notes (Signed)
Physical Therapy Treatment Patient Details Name: Ryan Lynch MRN: [id number] DOB: [birth year] Today's Date: 07/14/2022   History of Present Illness 41 yo male admitted 07/02/22 following fall down the stairs with subsequent tibial plateua fx requiring 4 compartment fasciotomies and placement of ex fix. S/p ROIF tibial plateu fx 8/22. s/p I& D LLE and second closure of wound 07/07/22.  PMH includes anxiety, ACL repair, psoriasis and daily consumption of alcohol.    PT Comments    Pt's parents were present during this session and he was motivated to demonstrate his mobility capabilities to them. Pt was able to transfer to stand with minA, but required modA for stability and RW management when hopping x2 bouts of ~4-6 ft each bout between surfaces as he can be impulsive with poor balance and safety awareness. Will continue to follow acutely. Current recommendations remain appropriate.     Recommendations for follow up therapy are one component of a multi-disciplinary discharge planning process, led by the attending physician.  Recommendations may be updated based on patient status, additional functional criteria and insurance authorization.  Follow Up Recommendations  Acute inpatient rehab (3hours/day)     Assistance Recommended at Discharge Frequent or constant Supervision/Assistance  Patient can return home with the following A little help with bathing/dressing/bathroom;Assistance with cooking/housework;Assist for transportation;Help with stairs or ramp for entrance;Direct supervision/assist for medications management;Direct supervision/assist for financial management;A lot of help with walking and/or transfers   Equipment Recommendations  Rolling walker (2 wheels);BSC/3in1;Wheelchair (measurements PT);Wheelchair cushion (measurements PT) (with elevating leg rest)    Recommendations for Other Services       Precautions / Restrictions Precautions Precautions: Fall Precaution Comments: watch  HR/O2/BP Required Braces or Orthoses: Other Brace Knee Immobilizer - Left: Other (comment) (on when OOB able to d/c supine per ortho notes 8/28) Other Brace: hinged knee brace, unlocked on when OOB able to d/c supine per ortho notes 8/28 Restrictions Weight Bearing Restrictions: Yes LLE Weight Bearing: Non weight bearing Other Position/Activity Restrictions: no ROM restrictions     Mobility  Bed Mobility Overal bed mobility: Needs Assistance Bed Mobility: Supine to Sit     Supine to sit: Min guard, HOB elevated     General bed mobility comments: Extra time, HOB elevated    Transfers Overall transfer level: Needs assistance Equipment used: Rolling walker (2 wheels) Transfers: Sit to/from Stand, Bed to chair/wheelchair/BSC Sit to Stand: Min assist   Step pivot transfers: Mod assist       General transfer comment: MinA to steady pt with power up to stand, 1x from EOB and 1x from commode. Pt needing reminders to keep L leg off ground and push up from sitting surface rather than pull on RW. Pt pulling on RW to stand the first time impulsively. ModA for stability and RW management with hop to L bed > commode and to R commode > recliner, needing cues to keep RW on ground and to sequence steps of transfer.    Ambulation/Gait Ambulation/Gait assistance: Mod assist Gait Distance (Feet): 6 Feet (x2 bouts of ~4 ft > ~6 ft) Assistive device: Rolling walker (2 wheels) Gait Pattern/deviations:  (hop-to) Gait velocity: decreased Gait velocity interpretation: <1.31 ft/sec, indicative of household ambulator   General Gait Details: Pt with unsteady hop-to bettwen bed > commode > recliner using RW, requiring modA for stability and RW management. Pt needing cues to keep RW on ground as he would often lift or tilt it, impulsively trying to progress to hopping quicker before being cued or when  cued to slow down.   Stairs             Wheelchair Mobility    Modified Rankin (Stroke  Patients Only)       Balance Overall balance assessment: Needs assistance Sitting-balance support: No upper extremity supported, Feet supported, Bilateral upper extremity supported Sitting balance-Leahy Scale: Fair Sitting balance - Comments: Prefers UE support   Standing balance support: Bilateral upper extremity supported, During functional activity, Reliant on assistive device for balance Standing balance-Leahy Scale: Poor Standing balance comment: Reliant on RW and up to [employer] Arousal/Alertness: Awake/alert Behavior During Therapy: Impulsive, Restless Overall Cognitive Status: Impaired/Different from baseline Area of Impairment: Safety/judgement, Following commands, Attention, Awareness, Memory, Problem solving                   Current Attention Level: Sustained Memory: Decreased recall of precautions, Decreased short-term memory Following Commands: Follows one step commands with increased time, Follows one step commands consistently, Follows multi-step commands inconsistently Safety/Judgement: Decreased awareness of safety, Decreased awareness of deficits Awareness: Emergent Problem Solving: Slow processing, Difficulty sequencing, Requires verbal cues General Comments: Pt impulsive to try to stand prior to therapist being fully ready, likely due to wanting to "show off" for his parents that were present. Pt needing simple step-by-step cues to sequence RW and hopping. Needs reminders to keep RW on ground. Poor attention span.        Exercises      General Comments General comments (skin integrity, edema, etc.): SBP 140s supine, 120s end of session; pt reporting lightheadedness prior to and throughout session, but denied it getting much worse during      Pertinent Vitals/Pain Pain Assessment Pain Assessment: Faces Faces Pain Scale: Hurts even more Pain Location: L Leg, especially in dependent position Pain  Descriptors / Indicators: Operative site guarding, Discomfort, Grimacing, Guarding Pain Intervention(s): Limited activity within patient's tolerance, Monitored during session, Premedicated before session, Repositioned    Home Living                          Prior Function            PT Goals (current goals can now be found in the care plan section) Acute Rehab PT Goals Patient Stated Goal: to get better PT Goal Formulation: With patient/family Time For Goal Achievement: 07/17/22 Potential to Achieve Goals: Good Progress towards PT goals: Progressing toward goals    Frequency    Min 5X/week      PT Plan Current plan remains appropriate    Co-evaluation              AM-PAC PT "6 Clicks" Mobility   Outcome Measure  Help needed turning from your back to your side while in a flat bed without using bedrails?: A Little Help needed moving from lying on your back to sitting on the side of a flat bed without using bedrails?: A Little Help needed moving to and from a bed to a chair (including a wheelchair)?: A Lot Help needed standing up from a chair using your arms (e.g., wheelchair or bedside chair)?: A Little Help needed to walk in hospital room?: Total Help needed climbing 3-5 steps with a railing? : Total 6 Click Score: 13    End of Session Equipment Utilized During Treatment: Gait  belt Activity Tolerance: Patient tolerated treatment well Patient left: with call bell/phone within reach;in chair;with chair alarm set Nurse Communication: Mobility status;Other (comment) (vitals) PT Visit Diagnosis: Unsteadiness on feet (R26.81);Difficulty in walking, not elsewhere classified (R26.2);Other abnormalities of gait and mobility (R26.89);Pain Pain - Right/Left: Left Pain - part of body: Leg     Time: 1517-6160 PT Time Calculation (min) (ACUTE ONLY): 35 min  Charges:  $Gait Training: 8-22 mins $Therapeutic Activity: 8-22 mins                     Raymond Gurney, PT, DPT Acute Rehabilitation Services  Office: [phone]    Jewel Baize 07/14/2022, 12:51 PM

## 2022-07-14 NOTE — Progress Notes (Signed)
Notified Melissa RN to change drsg to PICC line d/t PICC line drsg no long intact and exposing the PICC site. Melissa RN VU. Tomasita Morrow, RN VAST

## 2022-07-14 NOTE — Progress Notes (Signed)
  Inpatient Rehabilitation Admissions Coordinator   I have begun Auth with [employer] for possible CIR admit when pt medically ready. I met with patient at bedside and he is aware and in agreement. I will follow up tomorrow.  Danne Baxter, RN, MSN Rehab Admissions Coordinator [phone] 07/14/2022 2:41 PM

## 2022-07-14 NOTE — Progress Notes (Signed)
7 Days Post-Op  Subjective: CC: Patient reports no abdominal pain, n/v. Eating ice chips and water that melts. Reports large liquid bm this am.   Objective: Vital signs in last 24 hours: Temp:  [98 F (36.7 C)-98.7 F (37.1 C)] 98.7 F (37.1 C) (08/31 0702) Pulse Rate:  [94-103] 99 (08/31 0702) Resp:  [16-20] 20 (08/31 0702) BP: (122-159)/(59-73) 126/69 (08/31 0702) SpO2:  [91 %-97 %] 94 % (08/31 0702) Last BM Date : 07/14/22  Intake/Output from previous day: 08/30 0701 - 08/31 0700 In: -  Out: 3600 [Urine:3600] Intake/Output this shift: No intake/output data recorded.  PE: Gen:  Alert, NAD Abd: Mild distension but soft, NT +BS  Lab Results:  Recent Labs    07/12/22 0137 07/12/22 1541 07/14/22 0605  WBC 3.9*  --  6.7  HGB 7.0* 7.4* 8.2*  HCT 22.8* 23.1* 26.4*  PLT 116*  --  157   BMET Recent Labs    07/13/22 0346 07/14/22 0605  NA 142 136  K 4.5 3.9  CL 107 103  CO2 26 25  GLUCOSE 107* 138*  BUN 6 8  CREATININE 0.58* 0.67  CALCIUM 8.6* 8.6*   PT/INR No results for input(s): "LABPROT", "INR" in the last 72 hours. CMP     Component Value Date/Time   NA 136 07/14/2022 0605   K 3.9 07/14/2022 0605   CL 103 07/14/2022 0605   CO2 25 07/14/2022 0605   GLUCOSE 138 (H) 07/14/2022 0605   BUN 8 07/14/2022 0605   CREATININE 0.67 07/14/2022 0605   CALCIUM 8.6 (L) 07/14/2022 0605   PROT 6.0 (L) 07/14/2022 0605   ALBUMIN 2.6 (L) 07/14/2022 0605   AST 37 07/14/2022 0605   ALT 27 07/14/2022 0605   ALKPHOS 324 (H) 07/14/2022 0605   BILITOT 1.5 (H) 07/14/2022 0605   GFRNONAA >60 07/14/2022 0605   Lipase     Component Value Date/Time   LIPASE 50 07/02/2022 1737    Studies/Results: No results found.  Anti-infectives: Anti-infectives (From admission, onward)    Start     Dose/Rate Route Frequency Ordered Stop   07/09/22 1300  Ampicillin-Sulbactam (UNASYN) 3 g in sodium chloride 0.9 % 100 mL IVPB  Status:  Discontinued        3 g 200 mL/hr over  30 Minutes Intravenous Every 6 hours 07/09/22 1203 07/13/22 1053   07/07/22 1000  ceFAZolin (ANCEF) IVPB 2g/100 mL premix        2 g 200 mL/hr over 30 Minutes Intravenous On call to O.R. 07/06/22 1001 07/07/22 1136   07/05/22 1800  ceFAZolin (ANCEF) IVPB 2g/100 mL premix        2 g 200 mL/hr over 30 Minutes Intravenous Every 8 hours 07/05/22 1540 07/06/22 1445   07/05/22 0600  ceFAZolin (ANCEF) IVPB 2g/100 mL premix        2 g 200 mL/hr over 30 Minutes Intravenous On call to O.R. 07/04/22 1225 07/05/22 0920   07/02/22 1830  ceFAZolin (ANCEF) IVPB 1 g/50 mL premix        1 g 100 mL/hr over 30 Minutes Intravenous Every 6 hours 07/02/22 1731 07/03/22 2006   07/02/22 1430  ceFAZolin (ANCEF) IVPB 1 g/50 mL premix        1 g 100 mL/hr over 30 Minutes Intravenous  Once 07/02/22 1419 07/03/22 2006        Assessment/Plan Ileus - Passed SBO protocol with contrast in his colon and rectum. - NGT out 8/30. Having bowel  function. NT on exam. Will do trial of cld.  - Mobilize as able to assist with bowel function - Mag> 2, K >4 for bowel function - We will follow with you.   FEN - CLD. IVF per TRH ID - none currently needed from our standpoint VTE - lovenox   LOS: 12 days    Jacinto Halim , [facility] Surgery 07/14/2022, 10:16 AM Please see Amion for pager number during day hours 7:00am-4:30pm

## 2022-07-14 NOTE — TOC Progression Note (Signed)
Transition of Care [facility]) - Progression Note    Patient Details  Name: Ryan Lynch MRN: [id number] Date of Birth: 1981-09-14  Transition of [facility]) CM/SW Contact  Beckie Busing, RN Phone Number:[phone]  07/14/2022, 4:04 PM  Clinical Narrative:    TOC continues to follow for disposition planning.        Expected Discharge Plan and Services                                                 Social Determinants of Health (SDOH) Interventions    Readmission Risk Interventions     No data to display

## 2022-07-15 DIAGNOSIS — S82145A Nondisplaced bicondylar fracture of left tibia, initial encounter for closed fracture: Secondary | ICD-10-CM

## 2022-07-15 HISTORY — DX: Nondisplaced bicondylar fracture of left tibia, initial encounter for closed fracture: S82.145A

## 2022-07-15 LAB — COMPREHENSIVE METABOLIC PANEL
ALT: 25 U/L (ref 0–44)
AST: 33 U/L (ref 15–41)
Albumin: 2.4 g/dL — ABNORMAL LOW (ref 3.5–5.0)
Alkaline Phosphatase: 321 U/L — ABNORMAL HIGH (ref 38–126)
Anion gap: 10 (ref 5–15)
BUN: 8 mg/dL (ref 6–20)
CO2: 24 mmol/L (ref 22–32)
Calcium: 8.8 mg/dL — ABNORMAL LOW (ref 8.9–10.3)
Chloride: 102 mmol/L (ref 98–111)
Creatinine, Ser: 0.7 mg/dL (ref 0.61–1.24)
GFR, Estimated: >60 mL/min (ref >60)
Glucose, Bld: 144 mg/dL — ABNORMAL HIGH (ref 70–99)
Potassium: 3.9 mmol/L (ref 3.5–5.1)
Sodium: 136 mmol/L (ref 135–145)
Total Bilirubin: 1.2 mg/dL (ref 0.3–1.2)
Total Protein: 5.5 g/dL — ABNORMAL LOW (ref 6.5–8.1)

## 2022-07-15 LAB — CBC
HCT: 23.8 % — ABNORMAL LOW (ref 39.0–52.0)
Hemoglobin: 7.7 g/dL — ABNORMAL LOW (ref 13.0–17.0)
MCH: 28.2 pg (ref 26.0–34.0)
MCHC: 32.4 g/dL (ref 30.0–36.0)
MCV: 87.2 fL (ref 80.0–100.0)
Platelets: 168 10*3/uL (ref 150–400)
RBC: 2.73 MIL/uL — ABNORMAL LOW (ref 4.22–5.81)
RDW: 17 % — ABNORMAL HIGH (ref 11.5–15.5)
WBC: 7.7 10*3/uL (ref 4.0–10.5)
nRBC: 1.4 % — ABNORMAL HIGH (ref 0.0–0.2)

## 2022-07-15 LAB — GLUCOSE, CAPILLARY
Glucose-Capillary: 139 mg/dL — ABNORMAL HIGH (ref 70–99)
Glucose-Capillary: 129 mg/dL — ABNORMAL HIGH (ref 70–99)

## 2022-07-15 MED ORDER — TRAVASOL 10 % IV SOLN
INTRAVENOUS | Status: DC
  Filled 2022-07-15: qty 648

## 2022-07-15 MED ORDER — TRAVASOL 10 % IV SOLN
INTRAVENOUS | Status: DC
  Filled 2022-07-15: qty 1296

## 2022-07-15 NOTE — Progress Notes (Signed)
  Inpatient Rehabilitation Admissions Coordinator   I have received insurance approval for Cir admit. Discussed with Casimiro Needle, [place] for surgery and Dr Mahala Menghini. Plan to advance diet today and wean TNA.  Will plan admit to CIR Saturday pending this medical plan. 4N nurse can call CIR at 12 noon on Saturday at [phone] to give report. Dr Berline Chough with Cir, will round Saturday morning to give final approval to admit. I will make the arrangements and discuss with patient today.  Ottie Glazier, RN, MSN Rehab Admissions Coordinator [phone] 07/15/2022 11:39 AM

## 2022-07-15 NOTE — Progress Notes (Addendum)
PHARMACY - TOTAL PARENTERAL NUTRITION CONSULT NOTE   Indication: Small bowel obstruction  Patient Measurements: Height: 5\' 11"  (180.3 cm) Weight: 91.6 kg (201 lb 15.1 oz) IBW/kg (Calculated) : 75.3 TPN AdjBW (KG): 91.6 Body mass index is 28.17 kg/m. Usual Weight: 190 lbs  Assessment: 41 yo male presented on 07/02/2022 for fall, found to have tibial plateau fracture with compartment syndrome s/p reduction and fasciotomy on 8/19 with Ortho. Patient now with concern for ileus vs. SBO on imaging, although patient has had multiple BMs. Surgery consulted. Pharmacy consulted to manage TPN. Patient noted to have developed moderate alcohol withdrawal symptoms.  9/1: Spoke with patient about his ability to tolerate clear liquid diet which was started yesterday. Reports from what he did have, tolerated it well without abdominal pain, nausea, vomiting. Plan for discharge tomorrow per surgery.  Glucose / Insulin: no hx of DM. CBGs <140. Off SSI since 8/30. Electrolytes: K 3.9, CoCa 10.1, others WNL Goal K >/=4 and Mag >/=2 with ileus Renal: Scr <1 stable. BUN WNL  Hepatic: LFTs WNL. Tbili down to 1.2, TG 151. Last prealbumin 20 on 8/21. Albumin 2.4 Intake / Output; MIVF: LBM 8/31, UOP 1.5 ml/kg/hr, MIVF: LR 25 GI Imaging:  8/28 xray: slight worsening small bowel dilatation 8/29: passed SBP protocol with contrast in colon and rectum GI Surgeries / Procedures: none since TPN start  Central access: 8/27 TPN start date: 8/28  Nutritional Goals: Goal TPN rate is 100 mL/hr (provides 130 g of protein and 2486 kcals per day)  RD Assessment: Estimated Needs Total Energy Estimated Needs: 2400-2600 Total Protein Estimated Needs: 120-135g Total Fluid Estimated Needs: >/=2L  Current Nutrition:  NPO and TPN 8/31 CLD  Plan:  Decrease TPN to half-rate of 50 mL/hr at 1800 with goal to turn off 9/2 prior to discharge TPN will provide 65g of protein and 1243 kcal, meeting 50% of needs Electrolytes in  TPN: Na 96mEq/L, K 69mEq/L, Ca 72mEq/L, Mg 68mEq/L, and Phos 64mmol/L. Cl:Ac max acetate (all will be half due to half rate) D/c PO MVI/zinc until able to take PO medications Add standard MVI and trace elements, folic acid 1mg  to TPN Reduce thiamine to 100mg  daily in TPN (received 250mg  IV x 5 days) Monitor standard TPN labs on Mon/Thurs  12m, PharmD Clinical Pharmacist 07/15/2022 11:26 AM

## 2022-07-15 NOTE — Progress Notes (Signed)
Physical Therapy Treatment Patient Details Name: Ryan Lynch MRN: [id number] DOB: 1981/04/15 Today's Date: 07/15/2022   History of Present Illness 41 yo male admitted 07/02/22 following fall down the stairs with subsequent tibial plateua fx requiring 4 compartment fasciotomies and placement of ex fix. S/p ROIF tibial plateu fx 8/22. s/p I& D LLE and second closure of wound 07/07/22.  PMH includes anxiety, ACL repair, psoriasis and daily consumption of alcohol.    PT Comments    Pt is demonstrating improved attention, memory, and sequencing, but continues to require repeated cues for all functional mobility. He was able to progress to ambulating up to ~28 ft with a RW and min-modA while maintaining NWB on his L leg. He remains at high risk for falls. Current recommendations remain appropriate. Will continue to follow acutely.     Recommendations for follow up therapy are one component of a multi-disciplinary discharge planning process, led by the attending physician.  Recommendations may be updated based on patient status, additional functional criteria and insurance authorization.  Follow Up Recommendations  Acute inpatient rehab (3hours/day)     Assistance Recommended at Discharge Frequent or constant Supervision/Assistance  Patient can return home with the following A little help with bathing/dressing/bathroom;Assistance with cooking/housework;Assist for transportation;Help with stairs or ramp for entrance;Direct supervision/assist for medications management;Direct supervision/assist for financial management;A lot of help with walking and/or transfers   Equipment Recommendations  Rolling walker (2 wheels);BSC/3in1;Wheelchair (measurements PT);Wheelchair cushion (measurements PT) (with elevating leg rest)    Recommendations for Other Services       Precautions / Restrictions Precautions Precautions: Fall Precaution Comments: watch vitals Required Braces or Orthoses: Other Brace Knee  Immobilizer - Left: Other (comment) (on when OOB able to d/c supine per ortho notes 8/28) Other Brace: hinged knee brace, unlocked on when OOB able to d/c supine per ortho notes 8/28 Restrictions Weight Bearing Restrictions: Yes LLE Weight Bearing: Non weight bearing Other Position/Activity Restrictions: no ROM restrictions     Mobility  Bed Mobility Overal bed mobility: Needs Assistance Bed Mobility: Supine to Sit     Supine to sit: HOB elevated, Supervision     General bed mobility comments: Extra time, HOB elevated    Transfers Overall transfer level: Needs assistance Equipment used: Rolling walker (2 wheels) Transfers: Sit to/from Stand Sit to Stand: Min assist           General transfer comment: MinA to steady pt with power up to stand, 1x from EOB and 1x from recliner. Pt needing reminders to push up from sitting surface rather than pull on RW.    Ambulation/Gait Ambulation/Gait assistance: Mod assist, Min assist Gait Distance (Feet): 28 Feet (x2 bouts of ~20 ft > ~28 ft) Assistive device: Rolling walker (2 wheels) Gait Pattern/deviations:  (hop-to) Gait velocity: decreased Gait velocity interpretation: <1.31 ft/sec, indicative of household ambulator   General Gait Details: Pt following cues to slow down and keep RW on ground better today. Needs cues and increased assistance for balance when turning and hopping posteriorly. Min-modA for stability and cues to remain within RW.   Stairs             Wheelchair Mobility    Modified Rankin (Stroke Patients Only)       Balance Overall balance assessment: Needs assistance Sitting-balance support: No upper extremity supported, Feet supported, Bilateral upper extremity supported Sitting balance-Leahy Scale: Fair Sitting balance - Comments: Prefers UE support   Standing balance support: Bilateral upper extremity supported, During functional activity, Reliant on assistive  device for balance Standing  balance-Leahy Scale: Poor Standing balance comment: Reliant on RW and up to [employer] Arousal/Alertness: Awake/alert Behavior During Therapy: Impulsive, Restless Overall Cognitive Status: Impaired/Different from baseline Area of Impairment: Safety/judgement, Following commands, Attention, Awareness, Memory, Problem solving                   Current Attention Level: Selective Memory: Decreased recall of precautions, Decreased short-term memory Following Commands: Follows one step commands with increased time, Follows one step commands consistently, Follows multi-step commands inconsistently Safety/Judgement: Decreased awareness of safety, Decreased awareness of deficits Awareness: Emergent Problem Solving: Slow processing, Difficulty sequencing, Requires verbal cues General Comments: Pt following cues more appropriately and consistently today, seeming to carryover cues on how to safely manage RW. Decreased impulsivity, but continues to require cues to slow down at times.        Exercises General Exercises - Lower Extremity Long Arc Quad: AROM, Left, 10 reps, Seated    General Comments General comments (skin integrity, edema, etc.): VSS on RA      Pertinent Vitals/Pain Pain Assessment Pain Assessment: Faces Faces Pain Scale: Hurts little more Pain Location: L Leg Pain Descriptors / Indicators: Operative site guarding, Discomfort, Grimacing, Guarding Pain Intervention(s): Limited activity within patient's tolerance, Monitored during session, Repositioned, Premedicated before session    Home Living                          Prior Function            PT Goals (current goals can now be found in the care plan section) Acute Rehab PT Goals Patient Stated Goal: to get better PT Goal Formulation: With patient Time For Goal Achievement: 07/17/22 Potential to Achieve Goals: Good Progress towards PT goals: Progressing  toward goals    Frequency    Min 5X/week      PT Plan Current plan remains appropriate    Co-evaluation              AM-PAC PT "6 Clicks" Mobility   Outcome Measure  Help needed turning from your back to your side while in a flat bed without using bedrails?: A Little Help needed moving from lying on your back to sitting on the side of a flat bed without using bedrails?: A Little Help needed moving to and from a bed to a chair (including a wheelchair)?: A Lot Help needed standing up from a chair using your arms (e.g., wheelchair or bedside chair)?: A Little Help needed to walk in hospital room?: A Lot Help needed climbing 3-5 steps with a railing? : Total 6 Click Score: 14    End of Session Equipment Utilized During Treatment: Gait belt Activity Tolerance: Patient tolerated treatment well Patient left: with call bell/phone within reach;in chair;with chair alarm set Nurse Communication: Mobility status;Other (comment) (vitals) PT Visit Diagnosis: Unsteadiness on feet (R26.81);Difficulty in walking, not elsewhere classified (R26.2);Other abnormalities of gait and mobility (R26.89);Pain Pain - Right/Left: Left Pain - part of body: Leg     Time: 1121-1150 PT Time Calculation (min) (ACUTE ONLY): 29 min  Charges:  $Gait Training: 23-37 mins                     Raymond Gurney, PT, DPT Acute Rehabilitation Services  Office: [phone]  Henrene Dodge Pettis 07/15/2022, 3:30 PM

## 2022-07-15 NOTE — H&P (Signed)
Physical Medicine and Rehabilitation Admission H&P    Chief Complaint  Patient presents with   Fall  : HPI: Ryan Lynch is a 41 year old right-handed male with history of anxiety maintained on Klonopin as well as Zoloft as well as alcohol use.  Per chart review lives with his parents.  Two-level home bed and bath main level.  Independent prior to admission.  Admitted 07/02/2022 after fall down approximately 8 steps.  Denied loss of consciousness.  Cranial CT scan showed broad-based right scalp hematoma without underlying skull fracture.  Negative noncontrast CT appearance of the brain.  CT cervical spine negative.  CT of the chest abdomen pelvis showed a T7 mild anterior vertebral body compression fracture with approximately 20% height loss.  No acute traumatic injury to the chest abdomen or pelvis.  Incidental finding of bilateral adrenal 16 mm left 10 mm right plan outpatient CT/MRI.  CT of the left tibia-fibula showed severely comminuted fracture of the lateral tibial plateau with 2 cm maximum depression of the articular surface with the depressed area measuring approximately 3 x 3.6 cm.  Fractures involves the medial lateral tibial eminence.  Nondisplaced intra-articular fracture of the posterior medial tibial plateau without significant depression.  Transverse fracture through the proximal tibial metaphysis without significant displacement as well as a large hemarthrosis..  Mild perifascial edema deep to the lateral gastrocnemius muscle with poor definition of the psoas muscle.  Admission chemistries unremarkable except glucose 135, AST 188, ALT 59, alcohol negative, lactic acid 2.4.  Patient underwent emergent 4 compartment fasciotomy left leg 07/02/2022 per Dr. Marcelino Scot with close reduction left tibial plateau/application of spanning external fixator left knee/aspiration of left knee joint.  Nonweightbearing left lower extremity in place cardiac regular knee brace unlocked and only needs to be on when  out of bed..  Surgical course complicated by traumatic rhabdomyolysis CK of 1513 improved to 405. He was cleared to begin Lovenox for DVT prophylaxis.  Conservative care advised for T7 compression fracture.  Hospital course complicated by ileus [id number] nasogastric tube placed diet slowly advance patient had been on TNA.  Acute blood loss anemia 7.7 and monitored.  Course complicated by some agitation question alcohol withdrawal versus hepatic encephalopathy felt scenario most consistent with withdrawal and monitored.  Initial urinary retention patient has been placed on Flomax with plan to remove Foley tube.  Therapy evaluations completed due to patient decreased functional mobility was admitted for a comprehensive rehab program.  Review of Systems  Constitutional:  Negative for chills and fever.  HENT:  Negative for hearing loss.   Eyes:  Negative for blurred vision and double vision.  Respiratory:  Negative for cough and shortness of breath.   Cardiovascular:  Negative for chest pain, palpitations and leg swelling.  Gastrointestinal:  Positive for constipation and nausea. Negative for vomiting.  Genitourinary:  Negative for dysuria, flank pain and hematuria.  Musculoskeletal:  Positive for myalgias.  Skin:  Negative for rash.  All other systems reviewed and are negative.  History reviewed. No pertinent past medical history. Past Surgical History:  Procedure Laterality Date   ANTERIOR CRUCIATE LIGAMENT REPAIR Right    APPLICATION OF WOUND VAC Left 07/02/2022   Procedure: APPLICATION OF WOUND VAC;  Surgeon: Altamese Harristown, MD;  Location: [place];  Service: Orthopedics;  Laterality: Left;   APPLICATION OF WOUND VAC Left 07/05/2022   Procedure: APPLICATION OF WOUND VAC;  Surgeon: Altamese [place], MD;  Location: [place];  Service: Orthopedics;  Laterality: Left;   CLOSED REDUCTION  TIBIA Left 07/02/2022   Procedure: CLOSED REDUCTION TIBIA;  Surgeon: Altamese [place], MD;  Location: [place];  Service:  Orthopedics;  Laterality: Left;   EXTERNAL FIXATION LEG Left 07/02/2022   Procedure: EXTERNAL FIXATION LEG;  Surgeon: Altamese Silerton, MD;  Location: [place];  Service: Orthopedics;  Laterality: Left;   FASCIOTOMY Left 07/02/2022   Procedure: FASCIOTOMY;  Surgeon: Altamese [place], MD;  Location: [place];  Service: Orthopedics;  Laterality: Left;   I & D EXTREMITY Left 07/07/2022   Procedure: IRRIGATION AND DEBRIDEMENT EXTREMITY;  Surgeon: Altamese Higginson, MD;  Location: [place];  Service: Orthopedics;  Laterality: Left;   ORIF TIBIA PLATEAU Left 07/05/2022   Procedure: OPEN REDUCTION INTERNAL FIXATION (ORIF) TIBIAL PLATEAU;  Surgeon: Altamese [place]psburg, MD;  Location: [place];  Service: Orthopedics;  Laterality: Left;   SECONDARY CLOSURE OF WOUND Left 07/05/2022   Procedure: SECONDARY CLOSURE OF WOUND;  Surgeon: Altamese [place]aysville, MD;  Location: [place];  Service: Orthopedics;  Laterality: Left;   SECONDARY CLOSURE OF WOUND Left 07/07/2022   Procedure: SECONDARY CLOSURE OF WOUND;  Surgeon: Altamese [place], MD;  Location: [place];  Service: Orthopedics;  Laterality: Left;   SHOULDER SURGERY Bilateral    History reviewed. No pertinent family history. Social History:  reports that he has never smoked. He does not have any smokeless tobacco history on file. He reports current alcohol use. He reports that he does not use drugs. Allergies: No Known Allergies Medications Prior to Admission  Medication Sig Dispense Refill   clonazePAM (KLONOPIN) 1 MG tablet Take 1 mg by mouth daily as needed for anxiety.     ibuprofen (ADVIL) 200 MG tablet Take 600 mg by mouth daily as needed (pain).     sertraline (ZOLOFT) 100 MG tablet Take 200 mg by mouth daily.        Home: Home Living Family/patient expects to be discharged to:: Private residence Living Arrangements: Parent Available Help at Discharge: Family Type of Home: House Home Access: Stairs to enter Technical brewer of Steps: 3 Entrance Stairs-Rails: Right,  Left Home Layout: Two level, Able to live on main level with bedroom/bathroom Bathroom Shower/Tub: Multimedia programmer: Standard Home Equipment: Crutches Additional Comments: reports hx of anxiety/depression   Functional History: Prior Function Prior Level of Function : Independent/Modified Independent  Functional Status:  Mobility: Bed Mobility Overal bed mobility: Needs Assistance Bed Mobility: Supine to Sit Rolling: Supervision Supine to sit: Min guard, HOB elevated Sit to supine: Min assist General bed mobility comments: Extra time, HOB elevated Transfers Overall transfer level: Needs assistance Equipment used: Rolling walker (2 wheels) Transfers: Sit to/from Stand, Bed to chair/wheelchair/BSC Sit to Stand: Min assist Bed to/from chair/wheelchair/BSC transfer type:: Step pivot Stand pivot transfers: Max assist Step pivot transfers: Mod assist General transfer comment: MinA to steady pt with power up to stand, 1x from EOB and 1x from commode. Pt needing reminders to keep L leg off ground and push up from sitting surface rather than pull on RW. Pt pulling on RW to stand the first time impulsively. ModA for stability and RW management with hop to L bed > commode and to R commode > recliner, needing cues to keep RW on ground and to sequence steps of transfer. Ambulation/Gait Ambulation/Gait assistance: Mod assist Gait Distance (Feet): 6 Feet (x2 bouts of ~4 ft > ~6 ft) Assistive device: Rolling walker (2 wheels) Gait Pattern/deviations:  (hop-to) General Gait Details: Pt with unsteady hop-to bettwen bed > commode > recliner using RW, requiring modA  for stability and RW management. Pt needing cues to keep RW on ground as he would often lift or tilt it, impulsively trying to progress to hopping quicker before being cued or when cued to slow down. Gait velocity: decreased Gait velocity interpretation: <1.31 ft/sec, indicative of household ambulator    ADL: ADL Overall  ADL's : Needs assistance/impaired Eating/Feeding: NPO Eating/Feeding Details (indicate cue type and reason): noted to have roughly 1 inch from the nose marker on the tubing has descended from the tip of patient nose. Rn made aware of concern that tubing has externally progressed. Pt also noted to have a few clearing of throats and cough. RN voiced she will check placement Grooming: Modified independent, Wash/dry face, Sitting Upper Body Bathing: Min guard, Cueing for safety, Sitting Lower Body Bathing: Moderate assistance, Sitting/lateral leans Upper Body Dressing : Min guard, Sitting Lower Body Dressing: Total assistance Lower Body Dressing Details (indicate cue type and reason): total (A) to don bledsoe brace Toilet Transfer: Maximal assistance, Stand-pivot, BSC/3in1, Rolling walker (2 wheels) Toilet Transfer Details (indicate cue type and reason): pt requires single step commands to progress from EOB to [facility] going toward the R side. pt with increased time and having pt repeat back the steps to ensure understanding prior to attempt. Pt with multiple lines and leads at this time Toileting- Architect and Hygiene: Total assistance Toileting - Clothing Manipulation Details (indicate cue type and reason): pt able to static standin NWB LLE for peri care by OT. pt returning to sitting on [facility] for OT to setup environment. Pt sit<>STand and recliner placed behind the pt for upright sitting in chair. Functional mobility during ADLs: +2 for safety/equipment, Minimal assistance, Rolling walker (2 wheels) General ADL Comments: Recommendation for two person (A) with RW to pivot toward R return to bed or dropping chair arm and sliding onto bed surface. pt is able to bridge buttock to (A)> pt in chair on chair alarm at this time  Cognition: Cognition Overall Cognitive Status: Impaired/Different from baseline Orientation Level: Oriented X4 Cognition Arousal/Alertness: Awake/alert Behavior During  Therapy: Impulsive, Restless Overall Cognitive Status: Impaired/Different from baseline Area of Impairment: Safety/judgement, Following commands, Attention, Awareness, Memory, Problem solving Orientation Level: Disoriented to, Time Current Attention Level: Sustained Memory: Decreased recall of precautions, Decreased short-term memory Following Commands: Follows one step commands with increased time, Follows one step commands consistently, Follows multi-step commands inconsistently Safety/Judgement: Decreased awareness of safety, Decreased awareness of deficits Awareness: Emergent Problem Solving: Slow processing, Difficulty sequencing, Requires verbal cues General Comments: Pt impulsive to try to stand prior to therapist being fully ready, likely due to wanting to "show off" for his parents that were present. Pt needing simple step-by-step cues to sequence RW and hopping. Needs reminders to keep RW on ground. Poor attention span.  Physical Exam: Blood pressure 109/68, pulse 99, temperature 98 F (36.7 C), temperature source Oral, resp. rate 16, height 5\' 11"  (1.803 m), weight 91.6 kg, SpO2 98 %. Physical Exam Skin:    Comments: Dressing in place left lower extremity with knee brace  Neurological:     Comments: Patient is alert.  Follows full commands.  Provides name and age.  Did have some difficulty with biographical information.     Results for orders placed or performed during the hospital encounter of 07/02/22 (from the past 48 hour(s))  Glucose, capillary     Status: Abnormal   Collection Time: 07/13/22 11:35 AM  Result Value Ref Range   Glucose-Capillary 119 (H) 70 - 99  mg/dL    Comment: Glucose reference range applies only to samples taken after fasting for at least 8 hours.  Glucose, capillary     Status: Abnormal   Collection Time: 07/13/22  3:03 PM  Result Value Ref Range   Glucose-Capillary 127 (H) 70 - 99 mg/dL    Comment: Glucose reference range applies only to samples  taken after fasting for at least 8 hours.  Glucose, capillary     Status: Abnormal   Collection Time: 07/13/22  8:24 PM  Result Value Ref Range   Glucose-Capillary 124 (H) 70 - 99 mg/dL    Comment: Glucose reference range applies only to samples taken after fasting for at least 8 hours.  Magnesium     Status: None   Collection Time: 07/14/22  6:05 AM  Result Value Ref Range   Magnesium 2.1 1.7 - 2.4 mg/dL    Comment: Performed at [facility], [address], [place], [place] [address]  Phosphorus     Status: None   Collection Time: 07/14/22  6:05 AM  Result Value Ref Range   Phosphorus 3.9 2.5 - 4.6 mg/dL    Comment: Performed at [facility], [address] [place], [place] [address]  Triglycerides     Status: Abnormal   Collection Time: 07/14/22  6:05 AM  Result Value Ref Range   Triglycerides 151 (H) <150 mg/dL    Comment: Performed at [facility], [address], [place], [place] [address]  Comprehensive metabolic panel     Status: Abnormal   Collection Time: 07/14/22  6:05 AM  Result Value Ref Range   Sodium 136 135 - 145 mmol/L   Potassium 3.9 3.5 - 5.1 mmol/L   Chloride 103 98 - 111 mmol/L   CO2 25 22 - 32 mmol/L   Glucose, Bld 138 (H) 70 - 99 mg/dL    Comment: Glucose reference range applies only to samples taken after fasting for at least 8 hours.   BUN 8 6 - 20 mg/dL   Creatinine, Ser 2.13 0.61 - 1.24 mg/dL   Calcium 8.6 (L) 8.9 - 10.3 mg/dL   Total Protein 6.0 (L) 6.5 - 8.1 g/dL   Albumin 2.6 (L) 3.5 - 5.0 g/dL   AST 37 15 - 41 U/L   ALT 27 0 - 44 U/L   Alkaline Phosphatase 324 (H) 38 - 126 U/L   Total Bilirubin 1.5 (H) 0.3 - 1.2 mg/dL   GFR, Estimated >08 >65 mL/min    Comment: (NOTE) Calculated using the CKD-EPI Creatinine Equation (2021)    Anion gap 8 5 - 15    Comment: Performed at [facility], [address], [place], [place] [address]  CBC     Status: Abnormal   Collection Time: 07/14/22  6:05 AM  Result Value Ref Range    WBC 6.7 4.0 - 10.5 K/uL   RBC 2.95 (L) 4.22 - 5.81 MIL/uL   Hemoglobin 8.2 (L) 13.0 - 17.0 g/dL   HCT 62.9 (L) 52.8 - 41.3 %   MCV 89.5 80.0 - 100.0 fL   MCH 27.8 26.0 - 34.0 pg   MCHC 31.1 30.0 - 36.0 g/dL   RDW 24.4 (H) 01.0 - 27.2 %   Platelets 157 150 - 400 K/uL   nRBC 0.7 (H) 0.0 - 0.2 %    Comment: Performed at [facility], [address] [place], [place] [address]  Glucose, capillary     Status:  Abnormal   Collection Time: 07/14/22  7:54 AM  Result Value Ref Range   Glucose-Capillary 146 (H) 70 - 99 mg/dL    Comment: Glucose reference range applies only to samples taken after fasting for at least 8 hours.  Glucose, capillary     Status: Abnormal   Collection Time: 07/14/22 11:18 AM  Result Value Ref Range   Glucose-Capillary 134 (H) 70 - 99 mg/dL    Comment: Glucose reference range applies only to samples taken after fasting for at least 8 hours.  Glucose, capillary     Status: Abnormal   Collection Time: 07/14/22  4:17 PM  Result Value Ref Range   Glucose-Capillary 133 (H) 70 - 99 mg/dL    Comment: Glucose reference range applies only to samples taken after fasting for at least 8 hours.  Glucose, capillary     Status: Abnormal   Collection Time: 07/14/22  8:04 PM  Result Value Ref Range   Glucose-Capillary 126 (H) 70 - 99 mg/dL    Comment: Glucose reference range applies only to samples taken after fasting for at least 8 hours.  Glucose, capillary     Status: Abnormal   Collection Time: 07/14/22 11:38 PM  Result Value Ref Range   Glucose-Capillary 128 (H) 70 - 99 mg/dL    Comment: Glucose reference range applies only to samples taken after fasting for at least 8 hours.  Glucose, capillary     Status: Abnormal   Collection Time: 07/15/22  3:12 AM  Result Value Ref Range   Glucose-Capillary 139 (H) 70 - 99 mg/dL    Comment: Glucose reference range applies only to samples taken after fasting for at least 8 hours.  Comprehensive metabolic panel     Status:  Abnormal   Collection Time: 07/15/22  4:25 AM  Result Value Ref Range   Sodium 136 135 - 145 mmol/L   Potassium 3.9 3.5 - 5.1 mmol/L   Chloride 102 98 - 111 mmol/L   CO2 24 22 - 32 mmol/L   Glucose, Bld 144 (H) 70 - 99 mg/dL    Comment: Glucose reference range applies only to samples taken after fasting for at least 8 hours.   BUN 8 6 - 20 mg/dL   Creatinine, Ser 0.70 0.61 - 1.24 mg/dL   Calcium 8.8 (L) 8.9 - 10.3 mg/dL   Total Protein 5.5 (L) 6.5 - 8.1 g/dL   Albumin 2.4 (L) 3.5 - 5.0 g/dL   AST 33 15 - 41 U/L   ALT 25 0 - 44 U/L   Alkaline Phosphatase 321 (H) 38 - 126 U/L   Total Bilirubin 1.2 0.3 - 1.2 mg/dL   GFR, Estimated >60 >60 mL/min    Comment: (NOTE) Calculated using the CKD-EPI Creatinine Equation (2021)    Anion gap 10 5 - 15    Comment: Performed at [facility], [place] [address], [place], [place] [address]  CBC     Status: Abnormal   Collection Time: 07/15/22  4:25 AM  Result Value Ref Range   WBC 7.7 4.0 - 10.5 K/uL   RBC 2.73 (L) 4.22 - 5.81 MIL/uL   Hemoglobin 7.7 (L) 13.0 - 17.0 g/dL   HCT 23.8 (L) 39.0 - 52.0 %   MCV 87.2 80.0 - 100.0 fL   MCH 28.2 26.0 - 34.0 pg   MCHC 32.4 30.0 - 36.0 g/dL   RDW 17.0 (H) 11.5 - 15.5 %   Platelets 168 150 - 400 K/uL   nRBC  1.4 (H) 0.0 - 0.2 %    Comment: Performed at [facility], [place] [address] [place], [place] [address]   No results found.    Blood pressure 109/68, pulse 99, temperature 98 F (36.7 C), temperature source Oral, resp. rate 16, height 5\' 11"  (1.803 m), weight 91.6 kg, SpO2 98 %.  Medical Problem List and Plan: 1. Functional deficits secondary to left tibial plateau fracture with compartment syndrome complicated by traumatic rhabdomyolysis.  Status post closed reduction as well as aspiration left knee and 4 compartment fasciotomy 07/01/2022.  Nonweightbearing with hinged knee brace when out of bed unlocked.  -patient may *** shower  -ELOS/Goals: *** 2.   Antithrombotics: -DVT/anticoagulation:  Pharmaceutical: Lovenox check vascular study  -antiplatelet therapy: N/A 3. Pain Management: Oxycodone as needed 4. Mood/Behavior/Sleep as well as history of anxiety: Klonopin 2 mg twice daily  -antipsychotic agents: N/A 5. Neuropsych/cognition: This patient is capable of making decisions on his own behalf. 6. Skin/Wound Care: Routine skin checks 7. Fluids/Electrolytes/Nutrition: Routine in and out with follow up chemestries 8.  Acute blood loss anemia.  Follow-up CBC 9.  Ileus.  Weaned from [place].  Advance diet as tolerated. 10.  Traumatic rhabdomyolysis.  CK1 513 improved to 405 11.  History of alcohol use.  Alcohol level negative on admission.  Monitor for withdrawal. 12.  Urinary retention.  Flomax.  Discontinue Foley tube check PVR. 13.  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  Follow-up outpatient CT/MRI   Cathlyn Parsons, PA-C 07/15/2022

## 2022-07-15 NOTE — Progress Notes (Signed)
8 Days Post-Op  Subjective: CC: Doing well with CLD. Denies abdominal pain, n/v. Passing flatus. BM yesterday.   Objective: Vital signs in last 24 hours: Temp:  [98 F (36.7 C)-98.9 F (37.2 C)] 98 F (36.7 C) (09/01 0755) Pulse Rate:  [99-110] 99 (09/01 0755) Resp:  [16-20] 16 (09/01 0755) BP: (109-128)/(68-75) 109/68 (09/01 0755) SpO2:  [96 %-100 %] 98 % (09/01 0755) Last BM Date : 07/14/22  Intake/Output from previous day: 08/31 0701 - 09/01 0700 In: 1225.7 [I.V.:1225.7] Out: 3350 [Urine:3350] Intake/Output this shift: No intake/output data recorded.  PE: Gen:  Alert, NAD Abd: Soft, ND, NT +BS  Lab Results:  Recent Labs    07/14/22 0605 07/15/22 0425  WBC 6.7 7.7  HGB 8.2* 7.7*  HCT 26.4* 23.8*  PLT 157 168   BMET Recent Labs    07/14/22 0605 07/15/22 0425  NA 136 136  K 3.9 3.9  CL 103 102  CO2 25 24  GLUCOSE 138* 144*  BUN 8 8  CREATININE 0.67 0.70  CALCIUM 8.6* 8.8*   PT/INR No results for input(s): "LABPROT", "INR" in the last 72 hours. CMP     Component Value Date/Time   NA 136 07/15/2022 0425   K 3.9 07/15/2022 0425   CL 102 07/15/2022 0425   CO2 24 07/15/2022 0425   GLUCOSE 144 (H) 07/15/2022 0425   BUN 8 07/15/2022 0425   CREATININE 0.70 07/15/2022 0425   CALCIUM 8.8 (L) 07/15/2022 0425   PROT 5.5 (L) 07/15/2022 0425   ALBUMIN 2.4 (L) 07/15/2022 0425   AST 33 07/15/2022 0425   ALT 25 07/15/2022 0425   ALKPHOS 321 (H) 07/15/2022 0425   BILITOT 1.2 07/15/2022 0425   GFRNONAA >60 07/15/2022 0425   Lipase     Component Value Date/Time   LIPASE 50 07/02/2022 1737    Studies/Results: No results found.  Anti-infectives: Anti-infectives (From admission, onward)    Start     Dose/Rate Route Frequency Ordered Stop   07/09/22 1300  Ampicillin-Sulbactam (UNASYN) 3 g in sodium chloride 0.9 % 100 mL IVPB  Status:  Discontinued        3 g 200 mL/hr over 30 Minutes Intravenous Every 6 hours 07/09/22 1203 07/13/22 1053    07/07/22 1000  ceFAZolin (ANCEF) IVPB 2g/100 mL premix        2 g 200 mL/hr over 30 Minutes Intravenous On call to O.R. 07/06/22 1001 07/07/22 1136   07/05/22 1800  ceFAZolin (ANCEF) IVPB 2g/100 mL premix        2 g 200 mL/hr over 30 Minutes Intravenous Every 8 hours 07/05/22 1540 07/06/22 1445   07/05/22 0600  ceFAZolin (ANCEF) IVPB 2g/100 mL premix        2 g 200 mL/hr over 30 Minutes Intravenous On call to O.R. 07/04/22 1225 07/05/22 0920   07/02/22 1830  ceFAZolin (ANCEF) IVPB 1 g/50 mL premix        1 g 100 mL/hr over 30 Minutes Intravenous Every 6 hours 07/02/22 1731 07/03/22 2006   07/02/22 1430  ceFAZolin (ANCEF) IVPB 1 g/50 mL premix        1 g 100 mL/hr over 30 Minutes Intravenous  Once 07/02/22 1419 07/03/22 2006        Assessment/Plan Ileus Clinically and radiographically resolving. Passed SBO protocol with contrast in his colon and rectum. Tolerating liquids, having bowel function, NT on exam. Will ADAT and wean TPN off. Will discuss with pharmacy if this can be weaned  off today or needs to be weaned over the next 24 hours. We will sign off. Please call back for any questions or concerns.    FEN - ADAT. IVF per TR[facility] ID - none currently needed from our standpoint VTE - lovenox   LOS: 13 days    Jacinto Halim , [facility] 07/15/2022, 11:00 AM Please see Amion for pager number during day hours 7:00am-4:30pm

## 2022-07-15 NOTE — Progress Notes (Signed)
PROGRESS NOTE   Ryan Lynch  OHY:[id number] DOB: 1981/07/25 DOA: 07/02/2022 PCP: Patient, No Pcp Per  Brief Narrative:  41 year old male known EtOH plan drinks [4-10 drinks per day] anxiety ADHD 8/19 fal- fall downstairs tibial plateau fracture compartment syndrome-emergent fasciotomy Dr. Carola Frost in OR--also T7 compression fracture as well as multiple 6 through 9 rib fractures 8/21-developed further distention of abdomen pelvis and found to have ileus 8/25 developed nausea vomiting tachypnea tachycardia no fever Unasyn initiated for fever of unknown origin  Hospitalization complicated by metabolic encephalopathy and apparent tachycardia concerning for fever  Hospital-Problem based course  Fall leading to polytrauma rib fractures, T7 compression fractures Tibial plateau fracture status post closed reduction left tibia fracture with fasciotomy 07/02/2022 left lower extremity  pain control--therapy rec let patient go to skilled Tylenol 325-650 every 6 as needed mild pain, Dilaudid 0.5-1 every 4 as needed severe pain  Traumatic rhabdomyolysis CK of 1513 and resolved-fluids d/c  Alcohol withdrawal versus hepatic encephalopathy Encephalopathic earlier in hospitalization now baseline mentation and coherent Etiology 2/2 withdrawal of Klonopin -LFT,PLT all point against significant cirrhosis--lactulose discontinued and patient continues to improve Place on home dose klonopin 2 bid   Ileus/possible SBO This developed on 8/21-resolved?pulled out his NG tube 8/30 --per gen surg graduated to full diet TPN to discontinue as per pharmacy/surgery  Acute urinary retention Flomax to be given and try voiding trial in the next several days  Bilateral adrenal 24mm left, 10 mm right needs outpatient CT MRI  Underlying anxiety Resumed Klonopin as above  Consumption coaglupathy with thrombocytopenia and transaminitis improving  Anemia likely of combine blood loss and cytopenia from  steatosis Transfusion threshold is less than 7-monitor trends and replace normal Iron 18 saturation ratios 5 , s/p IV iron x 1 8/30   DVT prophylaxis: SCDs Code Status: Full Family Communication: None present Disposition:  Status is: Inpatient Remains inpatient appropriate because:   Stabilizing--possible discharge to CIR 9/2   Consultants:  General surgery Ortho trauma  Procedures:   Antimicrobials: Unasyn until 8/30   Subjective:  Ambulated in the room with less diaphoresis looks well No fever or chills Eating drinking  Objective: Vitals:   07/14/22 2306 07/15/22 0314 07/15/22 0755 07/15/22 1120  BP: 119/70 128/75 109/68 99/60  Pulse: (!) 105 (!) 110 99 100  Resp: 20  16 18   Temp: 98.4 F (36.9 C) 98.9 F (37.2 C) 98 F (36.7 C) 98 F (36.7 C)  TempSrc: Oral Oral Oral Oral  SpO2: 100% 99% 98% 98%  Weight:      Height:        Intake/Output Summary (Last 24 hours) at 07/15/2022 1318 Last data filed at 07/15/2022 0634 Gross per 24 hour  Intake 1225.72 ml  Output 2350 ml  Net -1124.28 ml    Filed Weights   07/02/22 1300 07/03/22 2222  Weight: 86.2 kg 91.6 kg    Examination:  EOMI NCAT no icterus none tremulous, no diaphoresis S1-S2 no murmur no rub no gallop CTA B no added sound rales rhonchiNo wheezing Abdomen soft cannot appreciate HSM Patchy excoriations to back outer shins and areas consistent with psoriasis  Data Reviewed: personally reviewed   CBC    Component Value Date/Time   WBC 7.7 07/15/2022 0425   RBC 2.73 (L) 07/15/2022 0425   HGB 7.7 (L) 07/15/2022 0425   HCT 23.8 (L) 07/15/2022 0425   PLT 168 07/15/2022 0425   MCV 87.2 07/15/2022 0425   MCH 28.2 07/15/2022 0425   MCHC 32.4 07/15/2022  0425   RDW 17.0 (H) 07/15/2022 0425   LYMPHSABS 0.8 07/12/2022 0137   MONOABS 0.3 07/12/2022 0137   EOSABS 0.1 07/12/2022 0137   BASOSABS 0.0 07/12/2022 0137      Latest Ref Rng & Units 07/15/2022    4:25 AM 07/14/2022    6:05 AM 07/13/2022     3:46 AM  CMP  Glucose 70 - 99 mg/dL 403  474  259   BUN 6 - 20 mg/dL 8  8  6    Creatinine 0.61 - 1.24 mg/dL  5.63  8.75   Sodium 135 - 145 mmol/L 136  136  142   Potassium 3.5 - 5.1 mmol/L 3.9  3.9  4.5   Chloride 98 - 111 mmol/L 102  103  107   CO2 22 - 32 mmol/L 24  25  26    Calcium 8.9 - 10.3 mg/dL 8.8  8.6  8.6   Total Protein 6.5 - 8.1 g/dL 5.5  6.0    Total Bilirubin 0.3 - 1.2 mg/dL 1.2  1.5    Alkaline Phos 38 - 126 U/L 321  324    AST 15 - 41 U/L 33  37    ALT 0 - 44 U/L 25  27       Radiology Studies: No results found.   Scheduled Meds:  Chlorhexidine Gluconate Cloth  6 each Topical Daily   cholecalciferol  2,000 Units Oral BID   clonazePAM  2 mg Oral BID   enoxaparin (LOVENOX) injection  40 mg Subcutaneous Daily   feeding supplement  1 Container Oral TID BM   sodium chloride flush  10-40 mL Intracatheter Q12H   tamsulosin  0.4 mg Oral QPC supper   Continuous Infusions:  TPN ADULT (ION) 100 mL/hr at 07/14/22 1815   TPN ADULT (ION)       LOS: 13 days   Time spent: 15  , MD [facility] To contact the attending provider between 7A-7P or the covering provider during after hours 7P-7A, please log into the web site [email or web address] and access using universal [place] password for that web site. If you do not have the password, please call the hospital operator.  07/15/2022, 1:18 PM

## 2022-07-15 NOTE — Progress Notes (Signed)
This RN observed TPN rate going at 165mL/hr, per order it is supposed to be running at 138mL/hr, rate changed to 100  [name] Stall, RN

## 2022-07-16 ENCOUNTER — Encounter (HOSPITAL_COMMUNITY): Payer: Self-pay | Admitting: Physical Medicine & Rehabilitation

## 2022-07-16 ENCOUNTER — Other Ambulatory Visit: Payer: Self-pay

## 2022-07-16 ENCOUNTER — Inpatient Hospital Stay (HOSPITAL_COMMUNITY)
Admission: RE | Admit: 2022-07-16 | Discharge: 2022-07-23 | DRG: 560 | Disposition: A | Discharge status: Home / Self Care | Payer: BC Managed Care – PPO | Source: Intra-hospital | Attending: Physical Medicine & Rehabilitation | Admitting: Physical Medicine & Rehabilitation

## 2022-07-16 DIAGNOSIS — R339 Retention of urine, unspecified: Secondary | ICD-10-CM | POA: Diagnosis not present

## 2022-07-16 DIAGNOSIS — S22060D Wedge compression fracture of T7-T8 vertebra, subsequent encounter for fracture with routine healing: Secondary | ICD-10-CM | POA: Diagnosis not present

## 2022-07-16 DIAGNOSIS — E278 Other specified disorders of adrenal gland: Secondary | ICD-10-CM | POA: Diagnosis not present

## 2022-07-16 DIAGNOSIS — S22060A Wedge compression fracture of T7-T8 vertebra, initial encounter for closed fracture: Secondary | ICD-10-CM | POA: Diagnosis not present

## 2022-07-16 DIAGNOSIS — F1010 Alcohol abuse, uncomplicated: Secondary | ICD-10-CM | POA: Diagnosis not present

## 2022-07-16 DIAGNOSIS — T79A22S Traumatic compartment syndrome of left lower extremity, sequela: Secondary | ICD-10-CM | POA: Diagnosis not present

## 2022-07-16 DIAGNOSIS — R9431 Abnormal electrocardiogram [ECG] [EKG]: Secondary | ICD-10-CM | POA: Diagnosis not present

## 2022-07-16 DIAGNOSIS — S82145A Nondisplaced bicondylar fracture of left tibia, initial encounter for closed fracture: Secondary | ICD-10-CM | POA: Diagnosis not present

## 2022-07-16 DIAGNOSIS — S82142D Displaced bicondylar fracture of left tibia, subsequent encounter for closed fracture with routine healing: Principal | ICD-10-CM

## 2022-07-16 DIAGNOSIS — T79A0XS Compartment syndrome, unspecified, sequela: Secondary | ICD-10-CM | POA: Diagnosis not present

## 2022-07-16 DIAGNOSIS — T796XXD Traumatic ischemia of muscle, subsequent encounter: Secondary | ICD-10-CM | POA: Diagnosis not present

## 2022-07-16 DIAGNOSIS — R17 Unspecified jaundice: Secondary | ICD-10-CM | POA: Diagnosis not present

## 2022-07-16 DIAGNOSIS — T79A0XA Compartment syndrome, unspecified, initial encounter: Secondary | ICD-10-CM | POA: Diagnosis present

## 2022-07-16 DIAGNOSIS — M79605 Pain in left leg: Secondary | ICD-10-CM | POA: Diagnosis not present

## 2022-07-16 DIAGNOSIS — D62 Acute posthemorrhagic anemia: Secondary | ICD-10-CM | POA: Diagnosis present

## 2022-07-16 DIAGNOSIS — F339 Major depressive disorder, recurrent, unspecified: Secondary | ICD-10-CM | POA: Diagnosis not present

## 2022-07-16 DIAGNOSIS — L409 Psoriasis, unspecified: Secondary | ICD-10-CM | POA: Diagnosis not present

## 2022-07-16 DIAGNOSIS — F341 Dysthymic disorder: Secondary | ICD-10-CM | POA: Diagnosis not present

## 2022-07-16 DIAGNOSIS — S0003XD Contusion of scalp, subsequent encounter: Secondary | ICD-10-CM | POA: Diagnosis not present

## 2022-07-16 DIAGNOSIS — G9341 Metabolic encephalopathy: Secondary | ICD-10-CM | POA: Diagnosis not present

## 2022-07-16 DIAGNOSIS — F988 Other specified behavioral and emotional disorders with onset usually occurring in childhood and adolescence: Secondary | ICD-10-CM | POA: Diagnosis present

## 2022-07-16 DIAGNOSIS — F0781 Postconcussional syndrome: Secondary | ICD-10-CM | POA: Diagnosis present

## 2022-07-16 DIAGNOSIS — F411 Generalized anxiety disorder: Secondary | ICD-10-CM

## 2022-07-16 DIAGNOSIS — W109XXD Fall (on) (from) unspecified stairs and steps, subsequent encounter: Secondary | ICD-10-CM | POA: Diagnosis present

## 2022-07-16 DIAGNOSIS — S82142A Displaced bicondylar fracture of left tibia, initial encounter for closed fracture: Secondary | ICD-10-CM | POA: Diagnosis not present

## 2022-07-16 DIAGNOSIS — Z8619 Personal history of other infectious and parasitic diseases: Secondary | ICD-10-CM

## 2022-07-16 DIAGNOSIS — T79A0XD Compartment syndrome, unspecified, subsequent encounter: Secondary | ICD-10-CM | POA: Diagnosis not present

## 2022-07-16 DIAGNOSIS — S2241XD Multiple fractures of ribs, right side, subsequent encounter for fracture with routine healing: Secondary | ICD-10-CM

## 2022-07-16 DIAGNOSIS — T79A22D Traumatic compartment syndrome of left lower extremity, subsequent encounter: Secondary | ICD-10-CM | POA: Diagnosis not present

## 2022-07-16 DIAGNOSIS — S82145S Nondisplaced bicondylar fracture of left tibia, sequela: Secondary | ICD-10-CM | POA: Diagnosis not present

## 2022-07-16 DIAGNOSIS — K9189 Other postprocedural complications and disorders of digestive system: Secondary | ICD-10-CM | POA: Diagnosis not present

## 2022-07-16 DIAGNOSIS — Z79899 Other long term (current) drug therapy: Secondary | ICD-10-CM | POA: Diagnosis not present

## 2022-07-16 DIAGNOSIS — S82145D Nondisplaced bicondylar fracture of left tibia, subsequent encounter for closed fracture with routine healing: Secondary | ICD-10-CM | POA: Diagnosis not present

## 2022-07-16 DIAGNOSIS — T796XXA Traumatic ischemia of muscle, initial encounter: Secondary | ICD-10-CM | POA: Diagnosis not present

## 2022-07-16 DIAGNOSIS — M25462 Effusion, left knee: Secondary | ICD-10-CM | POA: Diagnosis not present

## 2022-07-16 DIAGNOSIS — K567 Ileus, unspecified: Secondary | ICD-10-CM | POA: Diagnosis not present

## 2022-07-16 LAB — GLUCOSE, CAPILLARY
Glucose-Capillary: 128 mg/dL — ABNORMAL HIGH (ref 70–99)
Glucose-Capillary: 123 mg/dL — ABNORMAL HIGH (ref 70–99)
Glucose-Capillary: 167 mg/dL — ABNORMAL HIGH (ref 70–99)

## 2022-07-16 LAB — COMPREHENSIVE METABOLIC PANEL
ALT: 25 U/L (ref 0–44)
AST: 31 U/L (ref 15–41)
Albumin: 2.6 g/dL — ABNORMAL LOW (ref 3.5–5.0)
Alkaline Phosphatase: 343 U/L — ABNORMAL HIGH (ref 38–126)
Anion gap: 6 (ref 5–15)
BUN: 7 mg/dL (ref 6–20)
CO2: 25 mmol/L (ref 22–32)
Calcium: 8.5 mg/dL — ABNORMAL LOW (ref 8.9–10.3)
Chloride: 105 mmol/L (ref 98–111)
Creatinine, Ser: 0.68 mg/dL (ref 0.61–1.24)
GFR, Estimated: >60 mL/min (ref >60)
Glucose, Bld: 126 mg/dL — ABNORMAL HIGH (ref 70–99)
Potassium: 4 mmol/L (ref 3.5–5.1)
Sodium: 136 mmol/L (ref 135–145)
Total Bilirubin: 1.3 mg/dL — ABNORMAL HIGH (ref 0.3–1.2)
Total Protein: 6 g/dL — ABNORMAL LOW (ref 6.5–8.1)

## 2022-07-16 LAB — CBC
HCT: 27 % — ABNORMAL LOW (ref 39.0–52.0)
Hemoglobin: 8.3 g/dL — ABNORMAL LOW (ref 13.0–17.0)
MCH: 28.2 pg (ref 26.0–34.0)
MCHC: 30.7 g/dL (ref 30.0–36.0)
MCV: 91.8 fL (ref 80.0–100.0)
Platelets: 170 10*3/uL (ref 150–400)
RBC: 2.94 MIL/uL — ABNORMAL LOW (ref 4.22–5.81)
RDW: 17.6 % — ABNORMAL HIGH (ref 11.5–15.5)
WBC: 8.7 10*3/uL (ref 4.0–10.5)
nRBC: 0.8 % — ABNORMAL HIGH (ref 0.0–0.2)

## 2022-07-16 MED ORDER — ONDANSETRON HCL 4 MG/2ML IJ SOLN: 4.0000 mg | Freq: Four times a day (QID) | INTRAMUSCULAR | Status: DC | PRN

## 2022-07-16 MED ORDER — ONDANSETRON HCL 4 MG PO TABS: 4.0000 mg | ORAL_TABLET | Freq: Four times a day (QID) | ORAL | Status: DC | PRN

## 2022-07-16 MED ORDER — CLONAZEPAM 2 MG PO TABS: 2.0000 mg | ORAL_TABLET | Freq: Two times a day (BID) | ORAL | 0 refills | Status: DC

## 2022-07-16 MED ORDER — ENOXAPARIN SODIUM 40 MG/0.4ML IJ SOSY
40.0000 mg | PREFILLED_SYRINGE | Freq: Every day | INTRAMUSCULAR | Status: DC
Start: 2022-07-17 — End: 2022-07-23
  Administered 2022-07-17 – 2022-07-23 (×7): 40 mg via SUBCUTANEOUS
  Filled 2022-07-16 (×7): qty 0.4

## 2022-07-16 MED ORDER — ACETAMINOPHEN 325 MG PO TABS: 325.0000 mg | ORAL_TABLET | Freq: Four times a day (QID) | ORAL | Status: DC | PRN

## 2022-07-16 MED ORDER — VITAMIN D 25 MCG (1000 UNIT) PO TABS
2000.0000 [IU] | ORAL_TABLET | Freq: Two times a day (BID) | ORAL | Status: DC
  Administered 2022-07-16 – 2022-07-23 (×14): 2000 [IU] via ORAL
  Filled 2022-07-16 (×14): qty 2

## 2022-07-16 MED ORDER — BETAMETHASONE VALERATE 0.1 % EX LOTN
TOPICAL_LOTION | Freq: Two times a day (BID) | CUTANEOUS | Status: DC
Start: 2022-07-16 — End: 2022-07-16

## 2022-07-16 MED ORDER — BOOST / RESOURCE BREEZE PO LIQD CUSTOM
1.0000 | Freq: Three times a day (TID) | ORAL | Status: DC
  Administered 2022-07-16 – 2022-07-22 (×10): 1 via ORAL

## 2022-07-16 MED ORDER — TAMSULOSIN HCL 0.4 MG PO CAPS: 0.4000 mg | ORAL_CAPSULE | Freq: Every day | ORAL | 2 refills | Status: DC

## 2022-07-16 MED ORDER — OXYCODONE-ACETAMINOPHEN 5-325 MG PO TABS
1.0000 | ORAL_TABLET | ORAL | Status: DC | PRN
  Administered 2022-07-17: 2 via ORAL
  Administered 2022-07-17: 1 via ORAL
  Administered 2022-07-17: 2 via ORAL
  Administered 2022-07-17: 1 via ORAL
  Administered 2022-07-18: 2 via ORAL
  Administered 2022-07-18: 1 via ORAL
  Administered 2022-07-18 – 2022-07-23 (×14): 2 via ORAL
  Filled 2022-07-16 (×11): qty 2
  Filled 2022-07-16 (×2): qty 1
  Filled 2022-07-16 (×2): qty 2
  Filled 2022-07-16: qty 1
  Filled 2022-07-16 (×5): qty 2

## 2022-07-16 MED ORDER — ENOXAPARIN SODIUM 40 MG/0.4ML IJ SOSY: 40.0000 mg | PREFILLED_SYRINGE | INTRAMUSCULAR | Status: DC

## 2022-07-16 MED ORDER — TRIAMCINOLONE ACETONIDE 0.5 % EX CREA
TOPICAL_CREAM | Freq: Two times a day (BID) | CUTANEOUS | Status: DC
  Administered 2022-07-16 – 2022-07-17 (×2): 1 via TOPICAL
  Filled 2022-07-16 (×2): qty 15

## 2022-07-16 MED ORDER — OXYCODONE-ACETAMINOPHEN 5-325 MG PO TABS
1.0000 | ORAL_TABLET | ORAL | 0 refills | Status: DC | PRN
Start: 2022-07-16 — End: 2022-07-22

## 2022-07-16 MED ORDER — TAMSULOSIN HCL 0.4 MG PO CAPS
0.4000 mg | ORAL_CAPSULE | Freq: Every day | ORAL | Status: DC
  Administered 2022-07-16 – 2022-07-22 (×7): 0.4 mg via ORAL
  Filled 2022-07-16 (×6): qty 1

## 2022-07-16 MED ORDER — CLONAZEPAM 0.5 MG PO TABS
2.0000 mg | ORAL_TABLET | Freq: Two times a day (BID) | ORAL | Status: DC
  Administered 2022-07-16 – 2022-07-23 (×14): 2 mg via ORAL
  Filled 2022-07-16 (×15): qty 4

## 2022-07-16 NOTE — Discharge Summary (Signed)
Physician Discharge Summary  Ryan Lynch [id number] DOB: 23-May-1981 DOA: 07/02/2022  PCP: Patient, No Pcp Per  Admit date: 07/02/2022 Discharge date: 07/16/2022  Time spent: 37 minutes  Recommendations for Outpatient Follow-up:  Dr. Marcelino Scot to follow-up patient and discuss further wound care options versus weightbearing status etc. while at rehab Requires Chem-7, CBC in about 2 to 3 days at rehab Foley catheter is to be removed-please consider discontinuation in the next several days Needs characterization of nodule on kidney and adrenal gland with imaging in the next 2 to 3 months once recovers from acute illness Pain control ibuprofen first choice, Percocet second choice and wean off as tolerated  Discharge Diagnoses:  MAIN problem for hospitalization   Polytrauma including rib fractures compression fracture and tibial plateau fracture with emergent fasciotomy left lower extremity secondary to compartment syndrome on admission Hospital-acquired delirium likely secondary to alcoholism and withdrawal from BZD Ileus SBO now resolved Acute urinary retention  Please see below for itemized issues addressed in HOps[facility]- refer to other progress notes for clarity if needed  Discharge Condition: Improved  Diet recommendation: Regular  Filed Weights   07/02/22 1300 07/03/22 2222  Weight: 86.2 kg 91.6 kg    History of present illness:  41 year old male known EtOH plan drinks [4-10 drinks per day] anxiety ADHD 8/19 fal- fall downstairs tibial plateau fracture compartment syndrome-emergent fasciotomy Dr. Marcelino Scot in OR--also T7 compression fracture as well as multiple 6 through 9 rib fractures 8/21-developed further distention of abdomen pelvis and found to have ileus 8/25 developed nausea vomiting tachypnea tachycardia no fever Unasyn initiated for fever of unknown origin   Hospitalization complicated by metabolic encephalopathy and apparent tachycardia concerning for fever    Hospital  Course:  Fall leading to polytrauma rib fractures, T7 compression fractures Tibial plateau fracture status post closed reduction left tibia fracture with fasciotomy 07/02/2022 left lower extremity  pain control--evaluated for CIR and probably can discharge there Dr. Marcelino Scot to follow-up patient for further postop recommendations weightbearing status etc. Tylenol 325-650 every 6 as needed mild pain, discontinued Dilaudid and will be discharging on opiates for severe pain and ibuprofen for moderate pain  Traumatic rhabdomyolysis CK of 1513 and resolved-fluids d/c   Alcohol withdrawal versus hepatic encephalopathy Encephalopathic earlier in hospitalization now baseline mentation and coherent Etiology 2/2 withdrawal of Klonopin -LFT,PLT all point against significant cirrhosis--lactulose discontinued and patient continues to improve in terms of mental status Place on home dose klonopin 2 bid at discharge and outpatient follow-up   Ileus/possible SBO This developed on 8/21-resolved?pulled out his NG tube 8/30 --per gen surg graduated to full diet TPN discontinued patient tolerating full diet   Acute urinary retention Flomax to be given and try voiding trial in the next several days   Bilateral adrenal 77mm left, 10 mm right needs outpatient CT MRI   Underlying anxiety Resumed Klonopin as above   Consumption coaglupathy with thrombocytopenia and transaminitis improving   Anemia likely of combine blood loss and cytopenia from steatosis Transfusion threshold is less than 7-monitor trends and replace normal Iron 18 saturation ratios 5 , s/p IV iron x 1 8/30 Periodic labs    Discharge Exam: Vitals:   07/16/22 0316 07/16/22 0726  BP: 121/62 116/62  Pulse: 96 99  Resp: 19 13  Temp: 98.5 F (36.9 C) 98.5 F (36.9 C)  SpO2: 97% 94%    Subj on day of d/c   Eyes closed and resting but arouses readily No overt pain eating and drinking passing stool no  nausea  General Exam on  discharge  EOMI NCAT no focal deficit Chest is clear no rales rhonchi  S1-S2 no murmur slight tachycardia Left lower extremity does not have dressings on it and just has sutures He has a Foley in place Abdomen is soft nontender no rebound  Discharge Instructions   Discharge Instructions     Diet - low sodium heart healthy   Complete by: As directed    Increase activity slowly   Complete by: As directed    No wound care   Complete by: As directed       Allergies as of 07/16/2022   No Known Allergies      Medication List     STOP taking these medications    sertraline 100 MG tablet Commonly known as: ZOLOFT       TAKE these medications    clonazePAM 2 MG tablet Commonly known as: KLONOPIN Take 1 tablet (2 mg total) by mouth 2 (two) times daily. What changed:  medication strength how much to take when to take this reasons to take this   ibuprofen 200 MG tablet Commonly known as: ADVIL Take 600 mg by mouth daily as needed (pain).   oxyCODONE-acetaminophen 5-325 MG tablet Commonly known as: PERCOCET/ROXICET Take 1-2 tablets by mouth every 4 (four) hours as needed for moderate pain.   tamsulosin 0.4 MG Caps capsule Commonly known as: FLOMAX Take 1 capsule (0.4 mg total) by mouth daily after supper.       No Known Allergies  Follow-up Information     [facility] [employer]. Call.   Why: to find out about alcohol rehab program Contact information: [address] [place] [address] [phone]         [facility]. Call.   Specialty: Behavioral Health Why: to find out about alcohol rehab programs Contact information: [facility] [address] [phone]         Alcoholics Anonymous. Call.   Why: Go online to get more information including local groups Contact information: [email or web address]  [place] [facility] [email or web address]  [phone]                  The results of significant diagnostics from this hospitalization (including imaging, microbiology, ancillary and laboratory) are listed below for reference.    Significant Diagnostic Studies: DG Abd Portable 1V-Small Bowel Obstruction Protocol-initial, 8 hr delay  Result Date: 07/11/2022 CLINICAL DATA:  Small-bowel obstruction. EXAM: PORTABLE ABDOMEN - 1 VIEW COMPARISON:  Portable study yesterday at 7:45 p.m. FINDINGS: There are multiple dilated small bowel segments in the with lower abdomen, maximum caliber 4 cm, previously 3.7 cm. Contrast intermixed with bowel gas continues to be seen in the descending colon through into the rectum. No colonic dilatation is seen. There is no supine evidence of free air. No new calcification is seen with right upper quadrant gallbladder calcification again partially visible. NGT is in place with the tip abutting the wall of the body of stomach. IMPRESSION: Slight worsening in small bowel dilatation, was previously up to 3.7 cm now maximum 4 cm. Contrast in the normal caliber left-sided colon and rectum. Electronically Signed   By: Telford Nab M.D.   On: 07/11/2022 06:30   DG Abd Portable 1V-Small Bowel Protocol-Position Verification  Result Date: 07/10/2022 CLINICAL DATA:  Check NG placement EXAM: PORTABLE ABDOMEN - 1 VIEW COMPARISON:  None Available. FINDINGS: Scattered large and small bowel gas is noted. Findings of small-bowel obstruction are seen. Gastric  catheter is noted with the tip in the stomach. Proximal side port lies at the gastroesophageal junction and could be advanced further into the stomach. Calcification is again noted in the right upper quadrant related to the gallbladder. IMPRESSION: Gastric catheter as described. This could be advanced slightly further into the stomach. Changes consistent with small bowel obstruction. Electronically Signed   By: Alcide Clever M.D.   On: 07/10/2022 20:05   [place] EKG SITE RITE  Result Date: 07/10/2022 If  [facility] image not attached, placement could not be confirmed due to current cardiac rhythm.  CT Angio Chest Pulmonary Embolism (PE) W or WO Contrast  Result Date: 07/10/2022 CLINICAL DATA:  Inpatient encounter for abdominal pain. Pulmonary embolus suspected with unknown D-dimer. EXAM: CT ANGIOGRAPHY CHEST CT ABDOMEN AND PELVIS WITH CONTRAST TECHNIQUE: Multidetector CT imaging of the chest was performed using the standard protocol during bolus administration of intravenous contrast. Multiplanar CT image reconstructions and MIPs were obtained to evaluate the vascular anatomy. Multidetector CT imaging of the abdomen and pelvis was performed using the standard protocol during bolus administration of intravenous contrast. RADIATION DOSE REDUCTION: This exam was performed according to the departmental dose-optimization program which includes automated exposure control, adjustment of the mA and/or kV according to patient size and/or use of iterative reconstruction technique. CONTRAST:  OMNIPAQUE IOHEXOL 350 MG/ML SOLN COMPARISON:  The chest, abdomen and pelvis CT with IV contrast 07/02/2022 and the abdomen and pelvis CT no contrast 07/04/2022. No older cross-sectional imaging for comparison. FINDINGS: CTA CHEST FINDINGS Cardiovascular: Pulmonary arteries are normal caliber but are suboptimally enhanced distal to the main lobar arteries due to bolus timing. Segmental and subsegmental arterial emboli are not excluded on this exam. There are no findings suspicious for acute right heart strain. The heart is borderline prominent. There is scattered three-vessel calcific CAD. There is no pericardial effusion. The pulmonary veins are decompressed. The thoracic aorta and great vessels are normal. Mediastinum/Nodes: The trachea is clear. The lower poles of the thyroid gland are unremarkable. Axillary spaces are clear. Mild superior mediastinal lipomatosis is again noted. There is no intrathoracic adenopathy. The  esophagus is increasingly patulous. NGT is now seen in place with the tip abutting the far wall of the body of stomach. There is no esophageal thickening. Lungs/Pleura: Interval development noted a small to moderate right and small left bilateral layering pleural effusions. There is adjacent consolidation with air bronchograms in the lower lobes which could be simple compressive atelectasis or a combination of atelectasis and pneumonic consolidation. There is posterior atelectasis in the right upper lobe. The more anterior lungs are clear. Central airways are widely patent. Musculoskeletal: No chest wall abnormality. A mild upper plate anterior wedge compression fracture of T7 is again shown. No new abnormality of the thoracic cage is seen. There are chronic healed fracture deformities of some of the right anterior ribs but there are fracture deformities which may be more recent again noted involving the anterolateral sixth and seventh ribs and the anterolateral eighth and ninth ribs. Review of the MIP images confirms the above findings. CT ABDOMEN and PELVIS FINDINGS Hepatobiliary: 20 cm in length mildly steatotic liver without mass. There is a stone versus wall calcification in the mid gallbladder but no wall thickening or biliary dilatation. Spleen: Splenomegaly with splenic length 16.9 cm, unchanged. No splenic mass. Pancreas: No focal abnormality. Adrenals/Urinary Tract: Bilateral adrenal adenomas, both confirm measuring less than 10 Hounsfield units on the noncontrast CT, are again noted measuring 1.8 cm on  the right, 1.5 cm on the left. The bilateral renal cortex is unremarkable. There is no urinary stone or obstruction. The bladder is catheterized with small amount of air in the lumen. Stomach/Bowel: The stomach is more fluid distended than previously. Indwelling NGT abuts the anterior wall of the gastric body. Little if any change is seen in diffuse small bowel dilatation, maximum small bowel caliber is up  to 4.4 cm, with high-grade obstruction felt to be present based on the difference in caliber between the dilated and the collapsed right lower quadrant segments. There is a transitional segment in the right lower quadrant at about the level of the iliac bifurcation posteriorly, best seen on 3: 59-62. Bowel gas and dense fluid continue to be seen in the mid to distal colon. There is increased stranding alongside the ascending and descending colon, unclear if this is due to colitis or dependent congestive changes/fluid overload. The ascending colon is relatively contracted. Vascular/Lymphatic: Trace aortic calcific plaque. No adenopathy is seen. Reproductive: No prostatomegaly. Other: There are mild mesenteric congestive features and development of mild ascites in the pelvis. There is minimal fluid in the pericolic gutters. There is no free air, free hemorrhage, bowel pneumatosis or abscess. Musculoskeletal: No acute or significant osseous findings. Review of the MIP images confirms the above findings. IMPRESSION: 1. No evidence of arterial dilatation or central emboli. The subsegmental and segmental arterial structures are largely not evaluated due to bolus timing. 2. Interval development of small to moderate right and small left pleural effusions with adjacent atelectasis or consolidation in the lower lobes. 3. Either the gallbladder is tightly contracted around a stone in the mid lumen or the patient has a partially porcelain gallbladder. There is no wall thickening or edema. 4. Persistent high-grade distal small-bowel obstruction with an abrupt caliber change in the right lower quadrant. 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 No internal hernia is visible. 5. Increased stranding along side the ascending and descending colon, could be due to a mild colitis or could be due to dependent congestive changes or fluid overload. There does appear to be increased congestive subcutaneous edema in the bilateral  flanks. 6. Aortic and coronary atherosclerosis. 7. Mild hepatosplenomegaly and hepatic steatosis. 8. Mild ascites.  No free air. 9. Mild upper plate anterior wedge compression fracture of the T7 vertebral body again noted and additional redemonstrated potentially recent fractures in the anterolateral right sixth through ninth ribs. Electronically Signed   By: Keith  Chesser M.D.   On: 07/10/2022 02:05   CT ABDOMEN PELVIS W CONTRAST  Result Date: 07/10/2022 CLINICAL DATA:  Inpatient encounter for abdominal pain. Pulmonary embolus suspected with unknown D-dimer. EXAM: CT ANGIOGRAPHY CHEST CT ABDOMEN AND PELVIS WITH CONTRAST TECHNIQUE: Multidetector CT imaging of the chest was performed using the standard protocol during bolus administration of intravenous contrast. Multiplanar CT image reconstructions and MIPs were obtained to evaluate the vascular anatomy. Multidetector CT imaging of the abdomen and pelvis was performed using the standard protocol during bolus administration of intravenous contrast. RADIATION DOSE REDUCTION: This exam was performed according to the departmental dose-optimization program which includes automated exposure control, adjustment of the mA and/or kV according to patient size and/or use of iterative reconstruction technique. CONTRAST:  <[id number] IOHEXOL 350 MG/ML SOLN COMPARISON:  The chest, abdomen and pelvis CT with IV contrast 07/02/2022 and the abdomen and pelvis CT no contrast 07/04/2022. No older cross-sectional imaging for comparison. FINDINGS: CTA CHEST FINDINGS Cardiovascular: Pulmonary arteries are normal caliber but are suboptimally  enhanced distal to the main lobar arteries due to bolus timing. Segmental and subsegmental arterial emboli are not excluded on this exam. There are no findings suspicious for acute right heart strain. The heart is borderline prominent. There is scattered three-vessel calcific CAD. There is no pericardial effusion. The pulmonary veins are  decompressed. The thoracic aorta and great vessels are normal. Mediastinum/Nodes: The trachea is clear. The lower poles of the thyroid gland are unremarkable. Axillary spaces are clear. Mild superior mediastinal lipomatosis is again noted. There is no intrathoracic adenopathy. The esophagus is increasingly patulous. NGT is now seen in place with the tip abutting the far wall of the body of stomach. There is no esophageal thickening. Lungs/Pleura: Interval development noted a small to moderate right and small left bilateral layering pleural effusions. There is adjacent consolidation with air bronchograms in the lower lobes which could be simple compressive atelectasis or a combination of atelectasis and pneumonic consolidation. There is posterior atelectasis in the right upper lobe. The more anterior lungs are clear. Central airways are widely patent. Musculoskeletal: No chest wall abnormality. A mild upper plate anterior wedge compression fracture of T7 is again shown. No new abnormality of the thoracic cage is seen. There are chronic healed fracture deformities of some of the right anterior ribs but there are fracture deformities which may be more recent again noted involving the anterolateral sixth and seventh ribs and the anterolateral eighth and ninth ribs. Review of the MIP images confirms the above findings. CT ABDOMEN and PELVIS FINDINGS Hepatobiliary: 20 cm in length mildly steatotic liver without mass. There is a stone versus wall calcification in the mid gallbladder but no wall thickening or biliary dilatation. Spleen: Splenomegaly with splenic length 16.9 cm, unchanged. No splenic mass. Pancreas: No focal abnormality. Adrenals/Urinary Tract: Bilateral adrenal adenomas, both confirm measuring less than 10 Hounsfield units on the noncontrast CT, are again noted measuring 1.8 cm on the right, 1.5 cm on the left. The bilateral renal cortex is unremarkable. There is no urinary stone or obstruction. The bladder  is catheterized with small amount of air in the lumen. Stomach/Bowel: The stomach is more fluid distended than previously. Indwelling NGT abuts the anterior wall of the gastric body. Little if any change is seen in diffuse small bowel dilatation, maximum small bowel caliber is up to 4.4 cm, with high-grade obstruction felt to be present based on the difference in caliber between the dilated and the collapsed right lower quadrant segments. There is a transitional segment in the right lower quadrant at about the level of the iliac bifurcation posteriorly, best seen on 3: 59-62. Bowel gas and dense fluid continue to be seen in the mid to distal colon. There is increased stranding alongside the ascending and descending colon, unclear if this is due to colitis or dependent congestive changes/fluid overload. The ascending colon is relatively contracted. Vascular/Lymphatic: Trace aortic calcific plaque. No adenopathy is seen. Reproductive: No prostatomegaly. Other: There are mild mesenteric congestive features and development of mild ascites in the pelvis. There is minimal fluid in the pericolic gutters. There is no free air, free hemorrhage, bowel pneumatosis or abscess. Musculoskeletal: No acute or significant osseous findings. Review of the MIP images confirms the above findings. IMPRESSION: 1. No evidence of arterial dilatation or central emboli. The subsegmental and segmental arterial structures are largely not evaluated due to bolus timing. 2. Interval development of small to moderate right and small left pleural effusions with adjacent atelectasis or consolidation in the lower lobes. 3. Either  the gallbladder is tightly contracted around a stone in the mid lumen or the patient has a partially porcelain gallbladder. There is no wall thickening or edema. 4. Persistent high-grade distal small-bowel obstruction with an abrupt caliber change in the right lower quadrant. 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 No  internal hernia is visible. 5. Increased stranding along side the ascending and descending colon, could be due to a mild colitis or could be due to dependent congestive changes or fluid overload. There does appear to be increased congestive subcutaneous edema in the bilateral flanks. 6. Aortic and coronary atherosclerosis. 7. Mild hepatosplenomegaly and hepatic steatosis. 8. Mild ascites.  No free air. 9. Mild upper plate anterior wedge compression fracture of the T7 vertebral body again noted and additional redemonstrated potentially recent fractures in the anterolateral right sixth through ninth ribs. Electronically Signed   By: Telford Nab M.D.   On: 07/10/2022 02:05   DG Abd 1 View  Result Date: 07/09/2022 CLINICAL DATA:  Check gastric catheter placement EXAM: ABDOMEN - 1 VIEW COMPARISON:  Film from the previous day. FINDINGS: Gastric catheter is noted within the stomach. Scattered large and small bowel gas is noted. Persistent small bowel dilatation is seen. IMPRESSION: Gastric catheter within the stomach Electronically Signed   By: Inez Catalina M.D.   On: 07/09/2022 22:43   VAS [place] LOWER EXTREMITY VENOUS (DVT)  Result Date: 07/09/2022  Lower Venous DVT Study Patient Name:  Ryan Lynch  Date of Exam:   07/09/2022 Medical Rec #: [id number]    Accession #:    [id number] Date of Birth: 1981/01/29    Patient Gender: M Patient Age:   58 years Exam Location:  [facility] Procedure:      VAS [place] LOWER EXTREMITY VENOUS (DVT) Referring Phys: Ainsley Spinner --------------------------------------------------------------------------------  Indications: Fever of unknown origin.  Risk Factors: Status post 4 compartment fasciotomy with closure 07/08/22. ORIF of left Tibial plateau and fasciotomy 07/05/22 Limitations: Bandages and open wound. Comparison Study: No prior study Performing Technologist: Sharion Dove RVS  Examination Guidelines: A complete evaluation includes B-mode imaging, spectral Doppler, color  Doppler, and power Doppler as needed of all accessible portions of each vessel. Bilateral testing is considered an integral part of a complete examination. Limited examinations for reoccurring indications may be performed as noted. The reflux portion of the exam is performed with the patient in reverse Trendelenburg.  +---------+---------------+---------+-----------+----------+--------------+ RIGHT    CompressibilityPhasicitySpontaneityPropertiesThrombus Aging +---------+---------------+---------+-----------+----------+--------------+ CFV      Full           Yes      Yes                                 +---------+---------------+---------+-----------+----------+--------------+ SFJ      Full                                                        +---------+---------------+---------+-----------+----------+--------------+ FV Prox  Full                                                        +---------+---------------+---------+-----------+----------+--------------+  FV Mid   Full                                                        +---------+---------------+---------+-----------+----------+--------------+ FV DistalFull                                                        +---------+---------------+---------+-----------+----------+--------------+ PFV      Full                                                        +---------+---------------+---------+-----------+----------+--------------+ POP      Full           Yes      Yes                                 +---------+---------------+---------+-----------+----------+--------------+ PTV      Full                                                        +---------+---------------+---------+-----------+----------+--------------+ PERO     Full                                                        +---------+---------------+---------+-----------+----------+--------------+ Soleal   Full                                                         +---------+---------------+---------+-----------+----------+--------------+   +---------+---------------+---------+-----------+----------+-------------------+ LEFT     CompressibilityPhasicitySpontaneityPropertiesThrombus Aging      +---------+---------------+---------+-----------+----------+-------------------+ CFV      Full           Yes      Yes                                      +---------+---------------+---------+-----------+----------+-------------------+ SFJ      Full                                                             +---------+---------------+---------+-----------+----------+-------------------+ FV Prox  Full                                                             +---------+---------------+---------+-----------+----------+-------------------+  FV Mid   Full                                                             +---------+---------------+---------+-----------+----------+-------------------+ FV DistalFull                                                             +---------+---------------+---------+-----------+----------+-------------------+ PFV      Full                                                             +---------+---------------+---------+-----------+----------+-------------------+ POP      Full           Yes      Yes                                      +---------+---------------+---------+-----------+----------+-------------------+ PTV      Full                                                             +---------+---------------+---------+-----------+----------+-------------------+ PERO                                                  Not well visualized +---------+---------------+---------+-----------+----------+-------------------+     Summary: RIGHT: - There is no evidence of deep vein thrombosis in the lower extremity.  LEFT: - There is no evidence of deep  vein thrombosis in the lower extremity. However, portions of this examination were limited- see technologist comments above.  *See table(s) above for measurements and observations. Electronically signed by Harold Barban MD on 07/09/2022 at 4:14:40 PM.    Final    DG CHEST PORT 1 VIEW  Result Date: 07/09/2022 CLINICAL DATA:  Shortness of breath and fever EXAM: PORTABLE CHEST 1 VIEW COMPARISON:  07/07/2022 and prior studies FINDINGS: The cardiomediastinal silhouette is unchanged. An enteric tube is identified with tip overlying the proximal stomach and side hole overlying the distal esophagus-recommend advancement. Bilateral LOWER lung opacities are slightly increased. There is no evidence of pneumothorax or large pleural effusion. IMPRESSION: Slightly increased bilateral LOWER lung opacities which may represent atelectasis versus airspace disease/pneumonia. Enteric tube with tip overlying the proximal stomach and side hole overlying the distal esophagus-recommend advancement. Electronically Signed   By: Margarette Canada M.D.   On: 07/09/2022 12:37   DG Abd 1 View  Result Date: 07/08/2022 CLINICAL DATA:  Ileus EXAM: ABDOMEN - 1 VIEW COMPARISON:  07/07/2022 abdominal radiograph FINDINGS: Diffuse mild-to-moderate small bowel dilatation up to 4.4  cm diameter, similar to minimally improved. No evidence of pneumatosis or pneumoperitoneum. Minimal colorectal gas. IMPRESSION: Diffuse mild-to-moderate small bowel dilatation, similar to minimally improved, compatible with reported adynamic ileus. Electronically Signed   By: Ilona Sorrel M.D.   On: 07/08/2022 07:59   DG Abd 1 View  Result Date: 07/07/2022 CLINICAL DATA:  Nasogastric tube placement. EXAM: ABDOMEN - 1 VIEW COMPARISON:  Same day. FINDINGS: Distal tip of nasogastric tube is seen in expected position of proximal stomach. Distal side hole is in expected position of distal esophagus. Stable small bowel dilatation is noted. IMPRESSION: Distal tip of nasogastric  tube is seen in expected position of proximal stomach. Distal side hole is seen in expected position of distal esophagus. Electronically Signed   By: Marijo Conception M.D.   On: 07/07/2022 13:05   DG Abd 1 View  Result Date: 07/07/2022 CLINICAL DATA:  Abdominal distention EXAM: ABDOMEN - 1 VIEW COMPARISON:  Previous studies including the examination of 07/06/2022 FINDINGS: There is interval increase in small bowel dilation. Dilated small bowel loops measure up to 5.1 cm in diameter. Gas is present in colon. Stomach is not distended. There is interval removal of enteric tube. IMPRESSION: There is slight increase in amount of small bowel gas suggesting ileus or partial obstruction. Electronically Signed   By: Elmer Picker M.D.   On: 07/07/2022 08:08   DG CHEST PORT 1 VIEW  Result Date: 07/07/2022 CLINICAL DATA:  Difficulty breathing EXAM: PORTABLE CHEST 1 VIEW COMPARISON:  Previous studies including the examination of 07/04/2022 FINDINGS: There is interval removal of enteric tube. There are linear densities in the lower lung fields with no significant change. There are no signs of pulmonary edema. There is poor inspiration. No new infiltrates are seen. There is no pleural effusion or pneumothorax. IMPRESSION: Linear densities in the lower lung fields suggest subsegmental atelectasis. Electronically Signed   By: Elmer Picker M.D.   On: 07/07/2022 08:05   DG Abd Portable 1V  Result Date: 07/06/2022 CLINICAL DATA:  NG tube placement. EXAM: PORTABLE ABDOMEN - 1 VIEW COMPARISON:  Radiograph earlier today FINDINGS: Tip and side port of the enteric tube below the diaphragm in the stomach. Slight progression in gaseous distension of small bowel in the central abdomen. Calcification in the right upper quadrant related to the gallbladder is stable from prior CT. IMPRESSION: 1. Tip and side port of the enteric tube below the diaphragm in the stomach. 2. Slight progression in gaseous distension of small  bowel in the central abdomen, progressive ileus versus obstruction. Electronically Signed   By: Keith Rake M.D.   On: 07/06/2022 17:55   DG Abd Portable 1V  Result Date: 07/06/2022 CLINICAL DATA:  NG tube EXAM: PORTABLE ABDOMEN - 1 VIEW COMPARISON:  07/05/2022 FINDINGS: NG tube remains position within the stomach. Numerous dilated loops of small bowel persist measuring up to 4.7 cm in diameter, previously 4.5 cm. Gaseous distension of the colon is also seen. Pattern remains more suggestive of ileus. IMPRESSION: NG tube remains within the stomach. Slight progression of small bowel distension. Overall pattern remains suggestive of ileus although partial or intermittent obstruction not excluded. Continued follow-up recommended. Electronically Signed   By: Davina Poke D.O.   On: 07/06/2022 13:50   DG Knee Complete 4 Views Left  Result Date: 07/05/2022 CLINICAL DATA:  [id number]; ORIF of left knee fracture EXAM: Images performed intraoperatively without presence of radiologist. Five intraoperative images of the left knee were obtained. Fluoro time: 44 seconds. Fluoro  dose: 3.08 mGy COMPARISON:  July 02, 2022 FINDINGS: There are postsurgical changes seen with a plate and screw devices transfixing the comminuted approximately 2 cm wedge-shaped depressed fracture of the lateral tibial plateau. The alignment is near anatomical. Soft tissue swelling. IMPRESSION: ORIF changes with plate and screw devices transfix the comminuted fracture of the lateral tibial plateau. The alignment is near anatomical. Electronically Signed   By: Frazier Richards M.D.   On: 07/05/2022 12:03   DG C-Arm 1-60 Min-No Report  Result Date: 07/05/2022 Fluoroscopy was utilized by the requesting physician.  No radiographic interpretation.   DG C-Arm 1-60 Min-No Report  Result Date: 07/05/2022 Fluoroscopy was utilized by the requesting physician.  No radiographic interpretation.   DG C-Arm 1-60 Min-No Report  Result Date:  07/05/2022 Fluoroscopy was utilized by the requesting physician.  No radiographic interpretation.   DG Abd Portable 1V  Result Date: 07/05/2022 CLINICAL DATA:  41 year old male status post advancement of nasogastric tube. EXAM: PORTABLE ABDOMEN - 1 VIEW COMPARISON:  Abdominal radiographs 07/05/2022. FINDINGS: Nasogastric tube has been advanced slightly, now with side port approximately 8 cm distal to the gastroesophageal junction. Mild gaseous distention of the stomach. Persistent gaseous distension throughout the small bowel, with small bowel loops measuring up to 4.5 cm in the central abdomen. A small amount of gas is also noted in the colon. IMPRESSION: 1. Support apparatus, as above. 2. Abnormal bowel-gas pattern, most suggestive of ileus based on comparison with prior CT 07/04/2022. Electronically Signed   By: Vinnie Langton M.D.   On: 07/05/2022 07:01   DG Abd Portable 1V  Result Date: 07/05/2022 CLINICAL DATA:  Nasogastric tube placement EXAM: PORTABLE ABDOMEN - 1 VIEW COMPARISON:  Abdominal CT from yesterday FINDINGS: Enteric tube with tip at the upper stomach and side-port over the lower esophagus. 7 cm advancement needed to place the side port at the stomach. Diffuse gaseous distension of bowel as seen on prior CT. No concerning mass effect or gas collection. Right upper quadrant calcification. IMPRESSION: 1. Enteric tube with tip at the stomach and side-port over the lower esophagus. Need 7 cm of advancement to place the side port at the upper stomach. 2. Ileus pattern. Electronically Signed   By: Jorje Guild M.D.   On: 07/05/2022 06:00   DG CHEST PORT 1 VIEW  Result Date: 07/05/2022 CLINICAL DATA:  [id number].  OG tube placement. EXAM: PORTABLE CHEST 1 VIEW COMPARISON:  Chest CT 07/02/2022 FINDINGS: 11:54 p.m. Interval enteric tube insertion. The tip is not included in the field but probably abuts the far wall of the body of the stomach based on the positioning of the proximal side hole.  The lungs are expiratory. There is increased bilateral streaky atelectasis or infiltrate in the infrahilar areas. The remaining hypoexpanded lungs are generally clear. No pleural effusion is seen. The cardiomediastinal silhouette and central vasculature are stable. IMPRESSION: 1. Enteric tube is adequately intragastric, tip abutting the far wall of the body of stomach. 2. Expiratory exam with increased bilateral infrahilar opacities consistent with atelectasis or pneumonia. Electronically Signed   By: Telford Nab M.D.   On: 07/05/2022 00:09   CT ABDOMEN PELVIS WO CONTRAST  Result Date: 07/04/2022 CLINICAL DATA:  Bowel obstruction suspected. EXAM: CT ABDOMEN AND PELVIS WITHOUT CONTRAST TECHNIQUE: Multidetector CT imaging of the abdomen and pelvis was performed following the standard protocol without IV contrast. RADIATION DOSE REDUCTION: This exam was performed according to the departmental dose-optimization program which includes automated exposure control, adjustment of the mA  and/or kV according to patient size and/or use of iterative reconstruction technique. COMPARISON:  Radiographs same date.  Abdominopelvic CT 07/02/2022 FINDINGS: Lower chest: Interval increased dependent atelectasis at both lung bases. No significant pleural or pericardial effusion. No basilar pneumothorax. Hepatobiliary: Hepatic steatosis without evidence of focal abnormality or acute injury on noncontrast imaging. Unchanged atypical appearance of the gallbladder which appears decompressed with a possible stone in the fundal region. There are small calcifications more proximally within the gallbladder wall. No surrounding inflammatory changes or biliary dilatation. Pancreas: Unremarkable. No pancreatic ductal dilatation or surrounding inflammatory changes. Spleen: Measures 13.7 x 7.6 x 13.5 cm (volume = 740 cm^3), consistent with mild splenomegaly, unchanged. No focal abnormality or surrounding fluid collection on noncontrast imaging.  Adrenals/Urinary Tract: Small low-density adrenal nodules bilaterally, measuring 1.3 cm and 9 HU on the right (image 27/3) and 1.5 cm and -2 HU on the left (image 31/3). These are consistent with benign adenomas, likely incidental. Tiny nonobstructing calculus in the lower pole of the right kidney. No evidence of ureteral calculus or hydronephrosis. The bladder appears unremarkable for its degree of distention. Stomach/Bowel: Enteric contrast was administered and has passed into the proximal small bowel. The stomach is not significantly distended. There is moderate diffuse small bowel and colonic dilatation without focal transition point, wall thickening or surrounding inflammation. The appendix appears normal. Vascular/Lymphatic: There are no enlarged abdominal or pelvic lymph nodes. No significant vascular findings on noncontrast imaging. No evidence of retroperitoneal hematoma. Reproductive: The prostate gland and seminal vesicles appear unremarkable. Other: Trace ascites in the right lower quadrant and left pelvis. No focal extraluminal fluid collection. No pneumoperitoneum. Musculoskeletal: No acute or significant osseous findings in the abdomen or pelvis. Mild T7 superior endplate compression deformity again noted. IMPRESSION: 1. Interval development of diffuse small and large bowel distension in a pattern most consistent with an ileus. Recommend radiographic follow-up. Patient may benefit from nasogastric tube decompression of the stomach. 2. Small amount of ascites in the right lower quadrant and left pelvis of undetermined etiology. No evidence of contrast leak, free air or drainable fluid collection. 3. Stable atypical appearance of the gallbladder which may be secondary to gallstones and/or gallbladder wall calcification. 4. Previously demonstrated small adrenal nodules measure near water density, consistent with adenomas, likely incidental. Electronically Signed   By: Richardean Sale M.D.   On:  07/04/2022 19:34   DG Abd 1 View  Result Date: 07/04/2022 CLINICAL DATA:  Decreased bowel sounds EXAM: ABDOMEN - 1 VIEW COMPARISON:  07/02/2022 FINDINGS: Diffusely dilated air-filled loops of both large and small bowel throughout the abdomen. No gross free intraperitoneal air. No acute bony findings. IMPRESSION: Diffusely dilated air-filled loops of both large and small bowel throughout the abdomen. Findings are most suggestive of ileus. Obstruction not excluded. Continued follow-up recommended. Electronically Signed   By: Davina Poke D.O.   On: 07/04/2022 13:04   CT KNEE LEFT WO CONTRAST  Result Date: 07/02/2022 CLINICAL DATA:  Knee trauma, tibial plateau fracture. EXAM: CT OF THE LEFT KNEE WITHOUT CONTRAST TECHNIQUE: Multidetector CT imaging of the left knee was performed according to the standard protocol. Multiplanar CT image reconstructions were also generated. RADIATION DOSE REDUCTION: This exam was performed according to the departmental dose-optimization program which includes automated exposure control, adjustment of the mA and/or kV according to patient size and/or use of iterative reconstruction technique. COMPARISON:  None Available. FINDINGS: Bones/Joint/Cartilage Severely comminuted fracture of the lateral tibial plateau with approximately 2 cm wedge-shaped depression. The depressed osseous  fragment measures at least 2.5 x 1.5 cm. There is also mildly displaced medial malleolar fracture without significant depression. There is a transverse fracture through the proximal tibial metadiaphysis. Large suprapatellar joint effusion with lipohemarthrosis. Ligaments Evaluation of ligaments is limited on CT examination. Muscles and Tendons Edema about the origin of the medial head of the gastrocnemius and soleus, likely sequela of the trauma. No drainable fluid collection or hematoma. Quadriceps and patellar tendon appear intact. Soft tissues Mild soft tissue edema about the knee. IMPRESSION: 1.  Comminuted depressed lateral tibial plateau fracture with approximately 2 cm wedge-shaped depression, nondisplaced fracture of the medial tibial plateau and transverse fracture of the tibial metaphysis, findings most consistent with Schatzker type 6 fracture. 2.  Large joint effusion with lipohemarthrosis. 3. Muscle edema and surrounding inflammatory changes of the lateral head of the gastrocnemius and soleus without evidence of intramuscular hematoma or fluid collection. Electronically Signed   By: Keane Police D.O.   On: 07/02/2022 21:24   DG Hand Complete Right  Result Date: 07/02/2022 CLINICAL DATA:  Bruising on posterior aspect of hand. EXAM: RIGHT HAND - COMPLETE 3+ VIEW COMPARISON:  None Available. FINDINGS: There is no evidence of fracture or dislocation. There is no evidence of arthropathy or other focal bone abnormality. Dorsal soft tissue edema overlies the metacarpals. IMPRESSION: Dorsal soft tissue edema. No fracture or dislocation. Electronically Signed   By: Keith Rake M.D.   On: 07/02/2022 19:35   DG Knee Left Port  Result Date: 07/02/2022 CLINICAL DATA:  Postop external fixator placement. EXAM: PORTABLE LEFT KNEE - 1-2 VIEW COMPARISON:  Preoperative radiographs. FINDINGS: External fixator placement with femoral pain only included on the AP view. The tibial pin is not included in the field of view. Comminuted proximal tibial fracture in slightly improved alignment from preoperative exam. A wound VAC is partially visualized. IMPRESSION: External fixator placement, femoral pain included on the AP view, tibial pins not visualized on the current exam. Comminuted proximal tibial fracture in improved alignment from preoperative exam. Electronically Signed   By: Keith Rake M.D.   On: 07/02/2022 19:34   DG Tibia/Fibula Left  Result Date: 07/02/2022 CLINICAL DATA:  External fixation. EXAM: LEFT TIBIA AND FIBULA - 2 VIEW COMPARISON:  Preoperative imaging. FINDINGS: Four fluoroscopic  spot views of the left tibia and fibula obtained in the operating room. Two pins are seen in the tibial shaft. Improved alignment of comminuted proximal tibial fracture. Fluoroscopy time 15 seconds. Dose 1.04 mGy. IMPRESSION: Intraoperative fluoroscopy during left tibia and fibula external fixation placement. Electronically Signed   By: Keith Rake M.D.   On: 07/02/2022 16:30   DG C-Arm 1-60 Min-No Report  Result Date: 07/02/2022 Fluoroscopy was utilized by the requesting physician.  No radiographic interpretation.   DG Tibia/Fibula Left  Result Date: 07/02/2022 CLINICAL DATA:  41 year old male status post fall down stairs. Comminuted left tibial plateau fracture. EXAM: LEFT TIBIA AND FIBULA - 2 VIEW COMPARISON:  Left tib fib CT today reported separately. FINDINGS: Comminuted and impacted tibial plateau and proximal metadiaphysis fracture as demonstrated by CT. Associated knee joint effusion. Calf soft tissue swelling. Distal left femur, patella and proximal fibula appear intact. Left ankle is detailed separately today. IMPRESSION: Comminuted and impacted left tibial plateau and proximal metadiaphysis fracture as demonstrated by CT today. Associated knee joint effusion. Electronically Signed   By: Genevie Ann M.D.   On: 07/02/2022 10:57   DG Pelvis Portable  Result Date: 07/02/2022 CLINICAL DATA:  41 year old male status post  fall down stairs. Comminuted left tibial plateau fracture. EXAM: PORTABLE PELVIS 1-2 VIEWS COMPARISON:  CT Chest, Abdomen, and Pelvis today reported separately. FINDINGS: Portable AP view at 0949 hours. Femoral heads are normally located. Intact pelvis and proximal femurs. Symphysis and SI joints appear within normal limits. Negative visible bowel gas. IMPRESSION: No acute fracture or dislocation identified about the pelvis. Electronically Signed   By: Odessa Fleming M.D.   On: 07/02/2022 10:56   DG Ankle Complete Left  Result Date: 07/02/2022 CLINICAL DATA:  41 year old male status  post fall down stairs. Comminuted left tibial plateau fracture. EXAM: LEFT ANKLE COMPLETE - 3+ VIEW COMPARISON:  Left tib fib series and CT today. FINDINGS: Mortise joint alignment maintained. Talar dome intact. No joint effusion identified. Calcaneus appears intact. Distal tibia and fibula, talus and visible bones of the left foot appear intact. IMPRESSION: No acute fracture or dislocation identified about the left ankle. Electronically Signed   By: Odessa Fleming M.D.   On: 07/02/2022 10:54   CT CERVICAL SPINE WO CONTRAST  Result Date: 07/02/2022 CLINICAL DATA:  41 year old male status post fall down stairs. Comminuted left tibial plateau fracture. EXAM: CT CERVICAL SPINE WITHOUT CONTRAST TECHNIQUE: Multidetector CT imaging of the cervical spine was performed without intravenous contrast. Multiplanar CT image reconstructions were also generated. RADIATION DOSE REDUCTION: This exam was performed according to the departmental dose-optimization program which includes automated exposure control, adjustment of the mA and/or kV according to patient size and/or use of iterative reconstruction technique. COMPARISON:  Head CT today. FINDINGS: Alignment: Straightening of cervical lordosis. Cervicothoracic junction alignment is within normal limits. Bilateral posterior element alignment is within normal limits. Skull base and vertebrae: Osteopenia for age. Visualized skull base is intact. No atlanto-occipital dissociation. C1 and C2 appear intact and aligned. No acute osseous abnormality identified. Soft tissues and spinal canal: No prevertebral fluid or swelling. No visible canal hematoma. Negative visible noncontrast neck soft tissues. Disc levels:  Mild cervical disc bulging. Upper chest: Negative. IMPRESSION: No acute traumatic injury identified in the cervical spine. Osteopenia for age. Electronically Signed   By: Odessa Fleming M.D.   On: 07/02/2022 10:53   CT CHEST ABDOMEN PELVIS W CONTRAST  Result Date: 07/02/2022 CLINICAL  DATA:  Status post fall down the stairs. EXAM: CT CHEST, ABDOMEN, AND PELVIS WITH CONTRAST TECHNIQUE: Multidetector CT imaging of the chest, abdomen and pelvis was performed following the standard protocol during bolus administration of intravenous contrast. RADIATION DOSE REDUCTION: This exam was performed according to the departmental dose-optimization program which includes automated exposure control, adjustment of the mA and/or kV according to patient size and/or use of iterative reconstruction technique. CONTRAST:  OMNIPAQUE IOHEXOL 300 MG/ML  SOLN COMPARISON:  None Available. FINDINGS: CT CHEST FINDINGS Cardiovascular: No significant vascular findings. Normal heart size. No pericardial effusion. Mediastinum/Nodes: No enlarged mediastinal, hilar, or axillary lymph nodes. Thyroid gland, trachea, and esophagus demonstrate no significant findings. Lungs/Pleura: Lungs are clear. No pleural effusion or pneumothorax. Musculoskeletal: T7 mild anterior vertebral body compression fracture with approximately 20% height loss. No aggressive osseous lesion. CT ABDOMEN PELVIS FINDINGS Hepatobiliary: No focal liver abnormality is seen. Diffuse low attenuation of the liver as can be seen with hepatic steatosis. Decompressed gallbladder with a gallstone in the fundus. No biliary ductal dilatation. Pancreas: Unremarkable. No pancreatic ductal dilatation or surrounding inflammatory changes. Spleen: Mild splenomegaly without focal abnormality measuring 13 x 13.5 x 7.9 cm. Adrenals/Urinary Tract: 16 mm indeterminate left adrenal hypodense mass. Indeterminate 10 mm right adrenal gland  mass. Kidneys are normal, without renal calculi, focal lesion, or hydronephrosis. Bladder is unremarkable. Stomach/Bowel: Stomach is within normal limits. No evidence of bowel wall thickening, distention, or inflammatory changes. Appendix is normal. Vascular/Lymphatic: No significant vascular findings are present. No enlarged abdominal or pelvic  lymph nodes. Reproductive: Prostate is unremarkable. Other: No abdominal wall hernia or abnormality. No abdominopelvic ascites. Musculoskeletal: No acute osseous abnormality. No aggressive osseous lesion. IMPRESSION: 1. T7 mild anterior vertebral body compression fracture with approximately 20% height loss. 2. Otherwise, no acute traumatic injury to the chest, abdomen or pelvis. 3. Hepatic steatosis. 4. Cholelithiasis without evidence of cholecystitis. 5. Indeterminate bilateral adrenal gland nodules. Recommend further characterization with a non emergent CT or MRI of the adrenal glands. Electronically Signed   By: Kathreen Devoid M.D.   On: 07/02/2022 10:51   CT HEAD WO CONTRAST  Result Date: 07/02/2022 CLINICAL DATA:  41 year old male status post fall down stairs. Comminuted left tibial plateau fracture. EXAM: CT HEAD WITHOUT CONTRAST TECHNIQUE: Contiguous axial images were obtained from the base of the skull through the vertex without intravenous contrast. RADIATION DOSE REDUCTION: This exam was performed according to the departmental dose-optimization program which includes automated exposure control, adjustment of the mA and/or kV according to patient size and/or use of iterative reconstruction technique. COMPARISON:  None Available. FINDINGS: Brain: Suggestion of generalized cerebral volume loss for age. No midline shift, ventriculomegaly, mass effect, evidence of mass lesion, intracranial hemorrhage or evidence of cortically based acute infarction. Gray-white matter differentiation is within normal limits throughout the brain. Vascular: Mild Calcified atherosclerosis at the skull base. No suspicious intracranial vascular hyperdensity. Skull: No skull fracture identified. Sinuses/Orbits: Visualized paranasal sinuses and mastoids are clear. Other: Broad-based right lateral convexity scalp hematoma. No scalp soft tissue gas. Visualized orbit soft tissues are within normal limits. IMPRESSION: 1. Broad-based  right scalp hematoma without underlying skull fracture. 2. Negative noncontrast CT appearance of the brain. Electronically Signed   By: Genevie Ann M.D.   On: 07/02/2022 10:51   DG Chest Port 1 View  Result Date: 07/02/2022 CLINICAL DATA:  41 year old male status post fall down stairs. Comminuted left tibial plateau fracture. EXAM: PORTABLE CHEST 1 VIEW COMPARISON:  None Available. FINDINGS: Portable AP supine view at 0945 hours. Normal lung volumes and mediastinal contours. Visualized tracheal air column is within normal limits. Allowing for portable technique the lungs are clear. No pneumothorax or pleural effusion identified on this supine view. No osseous abnormality identified. Negative visible bowel gas. IMPRESSION: Negative portable chest. Electronically Signed   By: Genevie Ann M.D.   On: 07/02/2022 10:49   CT Tibia Fibula Left Wo Contrast  Result Date: 07/02/2022 CLINICAL DATA:  Mechanical fall 6 hours ago. Deformity of the left leg. EXAM: CT OF THE LOWER LEFT EXTREMITY WITHOUT CONTRAST TECHNIQUE: Multidetector CT imaging of the lower left extremity was performed according to the standard protocol. RADIATION DOSE REDUCTION: This exam was performed according to the departmental dose-optimization program which includes automated exposure control, adjustment of the mA and/or kV according to patient size and/or use of iterative reconstruction technique. COMPARISON:  None Available. FINDINGS: Bones/Joint/Cartilage Severely comminuted fracture of the lateral tibial plateau with 2 cm of maximum depression of the articular surface with the depressed area measuring approximately 3 x 3.6 cm. Fracture involves the medial and lateral tibial eminence. Nondisplaced intra-articular fracture of the posterior medial tibial plateau without significant depression. Transverse fracture through the proximal tibial metaphysis without significant displacement. No other fracture or dislocation.  Large  hemarthrosis. Ligaments  Ligaments are suboptimally evaluated by CT. Muscles and Tendons No significant muscle atrophy. Mild perifascial edema deep to the lateral gastrocnemius muscle with poor definition of the soleus muscle as can be seen with muscle edema secondary to direct trauma or myositis. Soft tissue No fluid collection or hematoma.  No soft tissue mass. IMPRESSION: 1. Severely comminuted fracture of the lateral tibial plateau with 2 cm of maximum depression of the articular surface with the depressed area measuring approximately 3 x 3.6 cm. Fracture involves the medial and lateral tibial eminence. 2. Nondisplaced intra-articular fracture of the posterior medial tibial plateau without significant depression. 3. Transverse fracture through the proximal tibial metaphysis without significant displacement. 4. Large hemarthrosis. 5. Mild perifascial edema deep to the lateral gastrocnemius muscle with poor definition of the soleus muscle as can be seen with muscle edema secondary to direct trauma or myositis. These results were called by telephone at the time of interpretation on 07/02/2022 at 10:44 am to provider Georgina Snell , who verbally acknowledged these results. Electronically Signed   By: Kathreen Devoid M.D.   On: 07/02/2022 10:45    Microbiology: Recent Results (from the past 240 hour(s))  Culture, blood (Routine X 2) w Reflex to ID Panel     Status: None   Collection Time: 07/09/22 11:48 AM   Specimen: BLOOD  Result Value Ref Range Status   Specimen Description BLOOD LEFT ANTECUBITAL  Final   Special Requests   Final    BOTTLES DRAWN AEROBIC AND ANAEROBIC Blood Culture results may not be optimal due to an excessive volume of blood received in culture bottles   Culture   Final    NO GROWTH 5 DAYS Performed at [facility], [place] [address], [place], [place] [address]    Report Status 07/14/2022 FINAL  Final  Culture, blood (Routine X 2) w Reflex to ID Panel     Status: None   Collection Time: 07/09/22 12:10  PM   Specimen: BLOOD  Result Value Ref Range Status   Specimen Description BLOOD RIGHT ANTECUBITAL  Final   Special Requests   Final    BOTTLES DRAWN AEROBIC AND ANAEROBIC Blood Culture adequate volume   Culture   Final    NO GROWTH 5 DAYS Performed at [facility], [place] [address] [place], [place] [address]    Report Status 07/14/2022 FINAL  Final     Labs: Basic Metabolic Panel: Recent Labs  Lab 07/10/22 0132 07/11/22 0301 07/12/22 0137 07/13/22 0346 07/14/22 0605 07/15/22 0425 07/16/22 0137  NA 140 145 143 142 136 136 136  K 3.5 3.6 3.9 4.5 3.9 3.9 4.0  CL 110 117* 116* 107 103 102 105  CO2 23 23 23 26 25 24 25   GLUCOSE 118* 113* 119* 107* 138* 144* 126*  BUN 8 5* 5* 6 8 8 7   CREATININE 0.86 0.69 0.65 0.58* 0.67 0.70 0.68  CALCIUM 7.4* 7.9* 7.6* 8.6* 8.6* 8.8* 8.5*  MG 2.0 2.4 2.1  --  2.1  --   --   PHOS 3.3 2.9 3.6  --  3.9  --   --    Liver Function Tests: Recent Labs  Lab 07/11/22 0301 07/12/22 0137 07/14/22 0605 07/15/22 0425 07/16/22 0137  AST 23 26 37 33 31  ALT 26 20 27 25 25   ALKPHOS 123 150* 324* 321* 343*  BILITOT 1.1 1.4* 1.5* 1.2 1.3*  PROT 5.2* 4.6* 6.0* 5.5* 6.0*  ALBUMIN 2.1* 1.9* 2.6* 2.4*  2.6*   No results for input(s): "LIPASE", "AMYLASE" in the last 168 hours. Recent Labs  Lab 07/10/22 1442 07/11/22 0301 07/12/22 0137  AMMONIA 49* 56* 48*   CBC: Recent Labs  Lab 07/10/22 0132 07/11/22 0301 07/12/22 0137 07/12/22 1541 07/14/22 0605 07/15/22 0425 07/16/22 0137  WBC 3.5* 4.1 3.9*  --  6.7 7.7 8.7  NEUTROABS 2.6 2.7 2.6  --   --   --   --   HGB 7.4* 7.4* 7.0* 7.4* 8.2* 7.7* 8.3*  HCT 23.8* 24.6* 22.8* 23.1* 26.4* 23.8* 27.0*  MCV 90.5 92.8 91.6  --  89.5 87.2 91.8  PLT 126* 134* 116*  --  157 168 170   Cardiac Enzymes: No results for input(s): "CKTOTAL", "CKMB", "CKMBINDEX", "TROPONINI" in the last 168 hours. BNP: BNP (last 3 results) Recent Labs    07/10/22 1447  BNP 12.4    ProBNP (last 3 results) No  results for input(s): "PROBNP" in the last 8760 hours.  CBG: Recent Labs  Lab 07/14/22 2338 07/15/22 0312 07/15/22 2319 07/16/22 0319 07/16/22 0751  GLUCAP 128* 139* 129* 128* 123*       Signed:  Nita Sells MD   [facility] 07/16/2022, 10:07 AM

## 2022-07-16 NOTE — Progress Notes (Signed)
Inpatient Rehabilitation Admission Medication Review by a Pharmacist  A complete drug regimen review was completed for this patient to identify any potential clinically significant medication issues.  High Risk Drug Classes Is patient taking? Indication by Medication  Antipsychotic No   Anticoagulant Yes Lovenox - DVT px  Antibiotic No   Opioid Yes Percocet - pain  Antiplatelet No   Hypoglycemics/insulin No   Vasoactive Medication No   Chemotherapy No   Other Yes Flomax - BPH Klonopin - anxiety Vit D - supplementation     Type of Medication Issue Identified Description of Issue Recommendation(s)  Drug Interaction(s) (clinically significant)     Duplicate Therapy     Allergy     No Medication Administration End Date     Incorrect Dose     Additional Drug Therapy Needed     Significant med changes from prior encounter (inform family/care partners about these prior to discharge). Pt was on ibuprofen PTA but getting acetaminophen prior to transfer.  Continue acetaminophen and resume ibuprofen as needed  Other       Clinically significant medication issues were identified that warrant physician communication and completion of prescribed/recommended actions by midnight of the next day:  No  Name of provider notified for urgent issues identified:   Provider Method of Notification:     Pharmacist comments:   Time spent performing this drug regimen review (minutes):  20  Ulyses Southward, PharmD, BCIDP, AAHIVP, CPP Infectious Disease Pharmacist

## 2022-07-17 ENCOUNTER — Encounter (HOSPITAL_COMMUNITY): Payer: BLUE CROSS/BLUE SHIELD

## 2022-07-17 DIAGNOSIS — S82145D Nondisplaced bicondylar fracture of left tibia, subsequent encounter for closed fracture with routine healing: Secondary | ICD-10-CM | POA: Diagnosis not present

## 2022-07-17 NOTE — Evaluation (Signed)
Occupational Therapy Assessment and Plan  Patient Details  Name: Ryan Lynch MRN: [id number] Date of Birth: [birth year]  OT Diagnosis: acute pain, cognitive deficits, muscle weakness (generalized), and pain in joint Rehab Potential: Rehab Potential (ACUTE ONLY): Good ELOS: 7-10 days   Today's Date: 07/17/2022 OT Individual Time: 1430-1530 OT Individual Time Calculation (min): 60 min     Hospital Problem: Principal Problem:   Closed nondisplaced fracture of left tibial plateau Active Problems:   Traumatic compartment syndrome [facility])   Past Medical History: History reviewed. No pertinent past medical history. Past Surgical History:  Past Surgical History:  Procedure Laterality Date   ANTERIOR CRUCIATE LIGAMENT REPAIR Right    APPLICATION OF WOUND VAC Left 07/02/2022   Procedure: APPLICATION OF WOUND VAC;  Surgeon: Altamese Isabela, MD;  Location: [place];  Service: Orthopedics;  Laterality: Left;   APPLICATION OF WOUND VAC Left 07/05/2022   Procedure: APPLICATION OF WOUND VAC;  Surgeon: Altamese Fuig, MD;  Location: [place];  Service: Orthopedics;  Laterality: Left;   CLOSED REDUCTION TIBIA Left 07/02/2022   Procedure: CLOSED REDUCTION TIBIA;  Surgeon: Altamese Bajadero, MD;  Location: [place];  Service: Orthopedics;  Laterality: Left;   EXTERNAL FIXATION LEG Left 07/02/2022   Procedure: EXTERNAL FIXATION LEG;  Surgeon: Altamese Jennette, MD;  Location: [place];  Service: Orthopedics;  Laterality: Left;   FASCIOTOMY Left 07/02/2022   Procedure: FASCIOTOMY;  Surgeon: Altamese Lynndyl, MD;  Location: [place];  Service: Orthopedics;  Laterality: Left;   I & D EXTREMITY Left 07/07/2022   Procedure: IRRIGATION AND DEBRIDEMENT EXTREMITY;  Surgeon: Altamese Woodridge, MD;  Location: [place];  Service: Orthopedics;  Laterality: Left;   ORIF TIBIA PLATEAU Left 07/05/2022   Procedure: OPEN REDUCTION INTERNAL FIXATION (ORIF) TIBIAL PLATEAU;  Surgeon: Altamese West Perrine, MD;  Location: [place];  Service: Orthopedics;  Laterality:  Left;   SECONDARY CLOSURE OF WOUND Left 07/05/2022   Procedure: SECONDARY CLOSURE OF WOUND;  Surgeon: Altamese Gary, MD;  Location: [place];  Service: Orthopedics;  Laterality: Left;   SECONDARY CLOSURE OF WOUND Left 07/07/2022   Procedure: SECONDARY CLOSURE OF WOUND;  Surgeon: Altamese , MD;  Location: [place];  Service: Orthopedics;  Laterality: Left;   SHOULDER SURGERY Bilateral     Assessment & Plan Clinical Impression: Ryan Lynch is a 41 year old right-handed male with history of anxiety maintained on Klonopin as well as Zoloft as well as alcohol use.  Per chart review lives with his parents.  Two-level home bed and bath main level.  Independent prior to admission.  Admitted 07/02/2022 after fall down approximately 8 steps.  Denied loss of consciousness.  Cranial CT scan showed broad-based right scalp hematoma without underlying skull fracture.  Negative noncontrast CT appearance of the brain.  CT cervical spine negative.  CT of the chest abdomen pelvis showed a T7 mild anterior vertebral body compression fracture with approximately 20% height loss.  No acute traumatic injury to the chest abdomen or pelvis.  Incidental finding of bilateral adrenal 16 mm left 10 mm right plan outpatient CT/MRI.  CT of the left tibia-fibula showed severely comminuted fracture of the lateral tibial plateau with 2 cm maximum depression of the articular surface with the depressed area measuring approximately 3 x 3.6 cm.  Fractures involves the medial lateral tibial eminence.  Nondisplaced intra-articular fracture of the posterior medial tibial plateau without significant depression.  Transverse fracture through the proximal tibial metaphysis without significant displacement as well as a large hemarthrosis..  Mild perifascial edema deep to  the lateral gastrocnemius muscle with poor definition of the psoas muscle.  Admission chemistries unremarkable except glucose 135, AST 188, ALT 59, alcohol negative, lactic acid 2.4.   Patient underwent emergent 4 compartment fasciotomy left leg 07/02/2022 per Dr. Marcelino Scot with close reduction left tibial plateau/application of spanning external fixator left knee/aspiration of left knee joint.  Nonweightbearing left lower extremity in place cardiac regular knee brace unlocked and only needs to be on when out of bed..  Surgical course complicated by traumatic rhabdomyolysis CK of 1513 improved to 405. He was cleared to begin Lovenox for DVT prophylaxis.  Conservative care advised for T7 compression fracture.  Hospital course complicated by ileus 2/53 nasogastric tube placed diet slowly advance patient had been on TNA.  Acute blood loss anemia 7.7 and monitored.  Course complicated by some agitation question alcohol withdrawal versus hepatic encephalopathy felt scenario most consistent with withdrawal and monitored.  Initial urinary retention patient has been placed on Flomax with plan to remove Foley tube.  Therapy evaluations completed due to patient decreased functional mobility was admitted for a comprehensive rehab program.     Pt reports he's not drinking enough- ~ 4 cups/day.  Urine is dark- and also stinging some- wants foley out- advised will get out tomorrow AM LBM this AM- going well.  Failed voiding trials prior. Rate pain 9/10 with movement/PT- but at rest is 6/10- meds make him loopy so tries not to take the dialudid until after therapy.    Per nurse, he lost his child and as a result, wife and he divorced.  Was Art therapist at [employer].    Patient transferred to CIR on 07/16/2022 .    Patient currently requires min with basic self-care skills secondary to muscle weakness, decreased cardiorespiratoy endurance, impaired timing and sequencing and decreased coordination, decreased safety awareness, and decreased standing balance, decreased postural control, and decreased balance strategies.  Prior to hospitalization, patient could complete ADLs and IADLs  independently.  Patient will benefit from skilled intervention to increase independence with basic self-care skills prior to discharge home with care partner.  Anticipate patient will require 24 hour supervision and no further OT follow recommended.  OT - End of Session Activity Tolerance: Tolerates 30+ min activity with multiple rests Endurance Deficit: Yes Endurance Deficit Description: decreased OT Assessment Rehab Potential (ACUTE ONLY): Good OT Barriers to Discharge: Weight bearing restrictions OT Patient demonstrates impairments in the following area(s): Balance;Motor;Endurance;Safety;Cognition;Pain OT Basic ADL's Functional Problem(s): Grooming;Bathing;Dressing;Toileting OT Advanced ADL's Functional Problem(s): Simple Meal Preparation;Light Housekeeping OT Transfers Functional Problem(s): Toilet;Tub/Shower OT Additional Impairment(s): Fuctional Use of Upper Extremity OT Plan OT Intensity: Minimum of 1-2 x/day, 45 to 90 minutes OT Frequency: 5 out of 7 days OT Duration/Estimated Length of Stay: 7-10 days OT Treatment/Interventions: Community reintegration;Balance/vestibular training;UE/LE Coordination activities;Therapeutic Exercise;Therapeutic Activities;UE/LE Strength taining/ROM;DME/adaptive equipment instruction;Discharge planning;Cognitive remediation/compensation;Functional mobility training;Psychosocial support;Pain management;Patient/family education;Self Care/advanced ADL retraining OT Basic Self-Care Anticipated Outcome(s): Mod I OT Toileting Anticipated Outcome(s): Mod I OT Bathroom Transfers Anticipated Outcome(s): Mod I OT Recommendation Recommendations for Other Services: Neuropsych consult Patient destination: Home Follow Up Recommendations: 24 hour supervision/assistance;Home health OT Equipment Recommended: To be determined   OT Evaluation Precautions/Restrictions  Precautions Precautions: Fall Required Braces or Orthoses: Other Brace (knee brace  unlocked) Knee Immobilizer - Left: Other (comment) Other Brace: hinged knee brace, unlocked on when OOB able to d/c supine per ortho notes 8/28 Restrictions Weight Bearing Restrictions: Yes LLE Weight Bearing: Non weight bearing Other Position/Activity Restrictions: no ROM restrictions General Chart Reviewed: Yes  Vital Signs Therapy Vitals Temp: 98.8 F (37.1 C) Pulse Rate: (!) 103 Resp: 17 BP: 109/64 Patient Position (if appropriate): Lying Oxygen Therapy SpO2: 97 % O2 Device: Room Air Pain   Home Living/Prior Functioning Home Living Living Arrangements: Parent Available Help at Discharge: Family, Other (Comment), Available 24 hours/day (mother is retired and Engineer, mining) Type of Home: House Home Access: Stairs to enter Technical brewer of Steps: 3 Entrance Stairs-Rails: Right, Left Home Layout: Two level, Able to live on main level with bedroom/bathroom Alternate Level Stairs-Number of Steps: 13 Alternate Level Stairs-Rails: Right, Left Bathroom Shower/Tub: Chiropodist: Standard Additional Comments: reports hx of anxiety/depression  Lives With: Family IADL History Homemaking Responsibilities: Yes Homemaking Comments: enjoys cooking Current License: Yes Mode of Transportation: Musician (SUV) Education: college Occupation: Full time employment Type of Occupation: 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 [employer] but currently works to Scientist, 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 Prior Function Level of Independence: Independent with gait, Independent with homemaking with ambulation, Independent with transfers, Independent with basic ADLs  Able to Take Stairs?: Yes Driving: Yes Vocation: Full time employment Vision Baseline Vision/History: 1 Wears glasses Ability to See in Adequate Light: 0 Adequate Patient Visual Report: No change from baseline Vision Assessment?: No apparent visual  deficits Perception  Perception: Within Functional Limits Praxis Praxis: Intact Cognition Cognition Overall Cognitive Status: Impaired/Different from baseline Arousal/Alertness: Awake/alert Orientation Level: Person;Place;Situation Person: Oriented Place: Oriented Situation: Oriented Memory: Impaired Memory Impairment: Decreased recall of new information Attention: Selective Selective Attention: Impaired Selective Attention Impairment: Verbal complex Awareness: Impaired Awareness Impairment: Anticipatory impairment Problem Solving: Impaired Problem Solving Impairment: Functional complex Executive Function: Self Correcting;Self Monitoring Self Monitoring: Impaired Self Monitoring Impairment: Functional complex Self Correcting: Impaired Self Correcting Impairment: Functional complex Behaviors: Impulsive Safety/Judgment: Impaired Brief Interview for Mental Status (BIMS) Repetition of Three Words (First Attempt): 3 Temporal Orientation: Year: Correct Temporal Orientation: Month: Accurate within 5 days Temporal Orientation: Day: Correct Recall: "Sock": Yes, no cue required Recall: "Blue": Yes, no cue required Recall: "Bed": Yes, no cue required BIMS Summary Score: 15 Sensation Sensation Light Touch: Impaired Detail Peripheral sensation comments: impaired and diminshed right L3 and L4, right S1 numb s/p ACL surgery 15 years ago Light Touch Impaired Details: Impaired RLE;Impaired LLE Proprioception: Appears Intact Stereognosis: Not tested Coordination Gross Motor Movements are Fluid and Coordinated: No Fine Motor Movements are Fluid and Coordinated: No Coordination and Movement Description: UE hand tremors and grossly uncoordinated due to pain Finger Nose Finger Test: UE intention tremor present Motor  Motor Motor: Other (comment) Motor - Skilled Clinical Observations: decreased control due to pain, weakness, decreased activity tolerance and balance/coordiantion deficits   Trunk/Postural Assessment  Cervical Assessment Cervical Assessment: Within Functional Limits Thoracic Assessment Thoracic Assessment: Within Functional Limits Lumbar Assessment Lumbar Assessment: Within Functional Limits Postural Control Postural Control: Deficits on evaluation (delayed reaction time)  Balance Balance Balance Assessed: Yes Static Sitting Balance Static Sitting - Balance Support: Bilateral upper extremity supported;Feet supported Static Sitting - Level of Assistance: 5: Stand by assistance (supervsion) Dynamic Sitting Balance Dynamic Sitting - Balance Support: During functional activity;Right upper extremity supported;Feet supported Dynamic Sitting - Level of Assistance: 5: Stand by assistance (supervision) Dynamic Sitting - Balance Activities: Reaching for objects Static Standing Balance Static Standing - Balance Support: Bilateral upper extremity supported;During functional activity Static Standing - Level of Assistance: 4: Min assist Dynamic Standing Balance Dynamic Standing - Balance Support: During functional activity;Bilateral upper  extremity supported Dynamic Standing - Level of Assistance: 4: Min assist Dynamic Standing - Balance Activities: Reaching for objects Extremity/Trunk Assessment RUE Assessment RUE Assessment: Within Functional Limits General Strength Comments: 4/5 LUE Assessment LUE Assessment: Within Functional Limits General Strength Comments: 4/5  Care Tool Care Tool Self Care Eating   Eating Assist Level: Independent    Oral Care    Oral Care Assist Level: Supervision/Verbal cueing    Bathing   Body parts bathed by patient: Right arm;Left arm;Chest;Abdomen;Front perineal area;Buttocks;Right upper leg;Left upper leg;Left lower leg;Right lower leg;Face     Assist Level: Minimal Assistance - Patient > 75% (for balance)    Upper Body Dressing(including orthotics)   What is the patient wearing?: Pull over shirt   Assist Level:  Supervision/Verbal cueing    Lower Body Dressing (excluding footwear)   What is the patient wearing?: Pants (brace) Assist for lower body dressing: Minimal Assistance - Patient > 75%    Putting on/Taking off footwear   What is the patient wearing?: [place] for footwear: Contact Guard/Touching assist       Care Tool Toileting Toileting activity   Assist for toileting: Minimal Assistance - Patient > 75%     Care Tool Bed Mobility Roll left and right activity   Roll left and right assist level: Supervision/Verbal cueing    Sit to lying activity   Sit to lying assist level: Supervision/Verbal cueing    Lying to sitting on side of bed activity   Lying to sitting on side of bed assist level: the ability to move from lying on the back to sitting on the side of the bed with no back support.: Supervision/Verbal cueing     Care Tool Transfers Sit to stand transfer   Sit to stand assist level: Contact Guard/Touching assist    Chair/bed transfer   Chair/bed transfer assist level: Minimal Assistance - Patient > 75%     Toilet transfer   Assist Level: Minimal Assistance - Patient > 75%     Care Tool Cognition  Expression of Ideas and Wants Expression of Ideas and Wants: 4. Without difficulty (complex and basic) - expresses complex messages without difficulty and with speech that is clear and easy to understand  Understanding Verbal and Non-Verbal Content Understanding Verbal and Non-Verbal Content: 4. Understands (complex and basic) - clear comprehension without cues or repetitions   Memory/Recall Ability Memory/Recall Ability : Current season;That he or she is in a hospital/hospital unit   Refer to Care Plan for [place] 1 OT Short Term Goal 1 (Week 1): Pt will be Supervision for LB bathing/dressing OT Short Term Goal 2 (Week 1): Pt will be Supervision for toileting OT Short Term Goal 3 (Week 1): Pt will demonstrate safe setup for transfers with  Supervision OT Short Term Goal 4 (Week 1): Pt will complete functional transfers with Supervision  Recommendations for other services: Neuropsych   Skilled Therapeutic Intervention ADL ADL Eating: Independent Where Assessed-Eating: Bed level Grooming: Supervision/safety Where Assessed-Grooming: Sitting at sink;Wheelchair Upper Body Bathing: Supervision/safety Where Assessed-Upper Body Bathing: Sitting at sink;Wheelchair Lower Body Bathing: Minimal assistance Where Assessed-Lower Body Bathing: Sitting at sink;Standing at sink;Wheelchair Upper Body Dressing: Supervision/safety Where Assessed-Upper Body Dressing: Wheelchair;Sitting at sink Lower Body Dressing: Minimal assistance Where Assessed-Lower Body Dressing: Sitting at sink;Bed level;Standing at sink Toileting: Minimal assistance Where Assessed-Toileting: Glass blower/designer: Maximal Print production planner Method: Arts development officer: Energy manager: Not assessed Social research officer, government:  Contact guard Social research officer, government Method: Radiographer, therapeutic: Shower seat with back Mobility  Bed Mobility Bed Mobility: Rolling Right;Rolling Left;Supine to Sit;Sit to Supine Rolling Right: Supervision/verbal cueing Rolling Left: Supervision/Verbal cueing Supine to Sit: Supervision/Verbal cueing Sit to Supine: Supervision/Verbal cueing Transfers Sit to Stand: Minimal Assistance - Patient > 75% Stand to Sit: Minimal Assistance - Patient > 75%  Upon OT arrival, pt semi recumbent in bed reporting the need to urinate in urinal. Pt agreeable to OT session and reports 8.5/10 pain. OT evaluation initiated, educated on role of OT, POC discussed. Pt completes sponge bath ADL, functional mobility, and transfers at the levels above. Pt limited possibly by recent dosage of pain medication, decreased balance, weakness, and NWB status and wold benefit from continued OT services to achieve  highest level of independence. Pt was left in bed at end of session with all needs met and safety measures in place.   Discharge Criteria: Patient will be discharged from OT if patient refuses treatment 3 consecutive times without medical reason, if treatment goals not met, if there is a change in medical status, if patient makes no progress towards goals or if patient is discharged from hospital.  The above assessment, treatment plan, treatment alternatives and goals were discussed and mutually agreed upon: by patient  Marvetta Gibbons 07/17/2022, 4:43 PM

## 2022-07-17 NOTE — Plan of Care (Signed)
Problem: RH Balance Goal: LTG: Patient will maintain dynamic sitting balance (OT) Description: LTG:  Patient will maintain dynamic sitting balance with assistance during activities of daily living (OT) Flowsheets (Taken 07/17/2022 1650) LTG: Pt will maintain dynamic sitting balance during ADLs with: Independent Goal: LTG Patient will maintain dynamic standing with ADLs (OT) Description: LTG:  Patient will maintain dynamic standing balance with assist during activities of daily living (OT)  Flowsheets (Taken 07/17/2022 1650) LTG: Pt will maintain dynamic standing balance during ADLs with: Independent with assistive device   Problem: Sit to Stand Goal: LTG:  Patient will perform sit to stand in prep for activites of daily living with assistance level (OT) Description: LTG:  Patient will perform sit to stand in prep for activites of daily living with assistance level (OT) Flowsheets (Taken 07/17/2022 1650) LTG: PT will perform sit to stand in prep for activites of daily living with assistance level: Independent with assistive device   Problem: RH Grooming Goal: LTG Patient will perform grooming w/assist,cues/equip (OT) Description: LTG: Patient will perform grooming with assist, with/without cues using equipment (OT) Flowsheets (Taken 07/17/2022 1650) LTG: Pt will perform grooming with assistance level of: Independent with assistive device    Problem: RH Bathing Goal: LTG Patient will bathe all body parts with assist levels (OT) Description: LTG: Patient will bathe all body parts with assist levels (OT) Flowsheets (Taken 07/17/2022 1650) LTG: Pt will perform bathing with assistance level/cueing: Independent with assistive device  LTG: Position pt will perform bathing: Shower   Problem: RH Dressing Goal: LTG Patient will perform upper body dressing (OT) Description: LTG Patient will perform upper body dressing with assist, with/without cues (OT). Flowsheets (Taken 07/17/2022 1650) LTG: Pt will  perform upper body dressing with assistance level of: Independent with assistive device Goal: LTG Patient will perform lower body dressing w/assist (OT) Description: LTG: Patient will perform lower body dressing with assist, with/without cues in positioning using equipment (OT) Flowsheets (Taken 07/17/2022 1650) LTG: Pt will perform lower body dressing with assistance level of: Independent with assistive device   Problem: RH Toileting Goal: LTG Patient will perform toileting task (3/3 steps) with assistance level (OT) Description: LTG: Patient will perform toileting task (3/3 steps) with assistance level (OT)  Flowsheets (Taken 07/17/2022 1650) LTG: Pt will perform toileting task (3/3 steps) with assistance level: Independent with assistive device   Problem: RH Simple Meal Prep Goal: LTG Patient will perform simple meal prep w/assist (OT) Description: LTG: Patient will perform simple meal prep with assistance, with/without cues (OT). Flowsheets (Taken 07/17/2022 1650) LTG: Pt will perform simple meal prep with assistance level of: Independent with assistive device LTG: Pt will perform simple meal prep w/level of: Ambulate with device   Problem: RH Light Housekeeping Goal: LTG Patient will perform light housekeeping w/assist (OT) Description: LTG: Patient will perform light housekeeping with assistance, with/without cues (OT). Flowsheets (Taken 07/17/2022 1650) LTG: Pt will perform light housekeeping with assistance level of: Independent with assistive device LTG: Pt will perform light housekeeping w/level of: Ambulate with device   Problem: RH Toilet Transfers Goal: LTG Patient will perform toilet transfers w/assist (OT) Description: LTG: Patient will perform toilet transfers with assist, with/without cues using equipment (OT) Flowsheets (Taken 07/17/2022 1650) LTG: Pt will perform toilet transfers with assistance level of: Independent with assistive device   Problem: RH Tub/Shower  Transfers Goal: LTG Patient will perform tub/shower transfers w/assist (OT) Description: LTG: Patient will perform tub/shower transfers with assist, with/without cues using equipment (OT) Flowsheets (Taken 07/17/2022 1650)  LTG: Pt will perform tub/shower transfers from: Tub/shower combination   Problem: RH Memory Goal: LTG Patient will demonstrate ability for day to day recall/carry over during activities of daily living with assistance level (OT) Description: LTG:  Patient will demonstrate ability for day to day recall/carry over during activities of daily living with assistance level (OT). Flowsheets (Taken 07/17/2022 1650) LTG:  Patient will demonstrate ability for day to day recall/carry over during activities of daily living with assistance level (OT): Independent

## 2022-07-17 NOTE — Progress Notes (Signed)
Spoke with RN.  Dressing reinforced.  Pt requesting to come back later d/t on the bedpan BM.  RN states will be placing a PICC removal order and will notify IV Team when pt ready.

## 2022-07-17 NOTE — Op Note (Signed)
07/07/2022  6:05 PM  PATIENT:  Ryan Lynch  [birth year] male   MEDICAL RECORD NUMBER: [id number]  PREOPERATIVE DIAGNOSES: 1.  Left tibial plateau and shaft fractures s/p repair. 2.  Open medial left leg fasciotomy, status post release of compartment syndrome and closure of lateral fasciotomy.   POSTOPERATIVE DIAGNOSES: 1.  Left tibial plateau and shaft fractures s/p repair. 2.  Open medial left leg fasciotomy, status post release of compartment syndrome and closure of lateral fasciotomy.   PROCEDURES: Retention suture closure of medial fasciotomy 20 cm.   SURGEON:  Myrene Galas MD   ASSISTANT:  None   ANESTHESIA:  General.   COMPLICATIONS:  None.   ESTIMATED BLOOD LOSS:  Minimal.  TOURNIQUET: None.  SPECIMENS: None.   FINDINGS:  Viable muscle, pink and contractile.  DISPOSITION: To PACU.  CONDITION: Stable.   INDICATIONS FOR PROCEDURE:  The [name] is a 41 y.o. who underwent open reduction and compartment releases for resulting compartment syndrome. [name] has undergone suture repair of one side while continuing aggressive soft tissue management for reduction of swelling with wound vac dressing, compressive wrap, and elevation, in addition to ice. I discussed with the patient's father preoperatively the risks and benefits of surgery for attempted closure versus split thickness skin grafting, including the potential for infection, nerve injury, vessel injury, loss of motion, the need for further surgery, DVT, PE and multiple others. The patient's father acknowledged these risks and provided consent to proceed.   SUMMARY OF PROCEDURE:  After administration of preoperative antibiotics, the patient was taken to the operating room where general anesthesia was induced. A standard prep and drape was performed of the operative field after removing the VAC sponges and retention sutures which had previously been placed over 15 cm of the wound. The muscle was found to be pink and  healthy, contracting to cautery, with much improved swelling. There appeared to be enough soft tissue elasticity to accomplish a retention suture closure without excessive tension. A thorough irrigation was performed, and then retention sutures placed in step-wise fashion with #2 nylon, then intervening 2-o vicryl, then more #2 nylon far-near-near-far retention sutures and horizontal mattress closure of the 20 cm wound. The previously closed wound appeared to be in good condition.  Sterile gently compressive dressing was applied.   PROGNOSIS:  The patient will be discharged when sufficiently stable from medical standpoint, with NWB on the operative extremity and will return to the office in 10-14 days for removal of sutures and wound check. Pharmacologic DVT prophylaxis will be with Lovenox.  Myrene Galas, MD [facility], [facility]

## 2022-07-17 NOTE — Evaluation (Signed)
Speech Language Pathology Assessment and Plan  Patient Details  Name: Ryan Lynch MRN: [id number] Date of Birth: 1981/10/07  SLP Diagnosis: Cognitive Impairments  Rehab Potential: Good ELOS: 7-10 days    Today's Date: 07/17/2022 SLP Individual Time: 7829-5621 SLP Individual Time Calculation (min): 60 min   Hospital Problem: Principal Problem:   Closed nondisplaced fracture of left tibial plateau Active Problems:   Traumatic compartment syndrome [facility])  Past Medical History: History reviewed. No pertinent past medical history. Past Surgical History:  Past Surgical History:  Procedure Laterality Date   ANTERIOR CRUCIATE LIGAMENT REPAIR Right    APPLICATION OF WOUND VAC Left 07/02/2022   Procedure: APPLICATION OF WOUND VAC;  Surgeon: Myrene Galas, MD;  Location: [facility] OR;  Service: Orthopedics;  Laterality: Left;   APPLICATION OF WOUND VAC Left 07/05/2022   Procedure: APPLICATION OF WOUND VAC;  Surgeon: Myrene Galas, MD;  Location: [facility] OR;  Service: Orthopedics;  Laterality: Left;   CLOSED REDUCTION TIBIA Left 07/02/2022   Procedure: CLOSED REDUCTION TIBIA;  Surgeon: Myrene Galas, MD;  Location: [facility] OR;  Service: Orthopedics;  Laterality: Left;   EXTERNAL FIXATION LEG Left 07/02/2022   Procedure: EXTERNAL FIXATION LEG;  Surgeon: Myrene Galas, MD;  Location: [facility] OR;  Service: Orthopedics;  Laterality: Left;   FASCIOTOMY Left 07/02/2022   Procedure: FASCIOTOMY;  Surgeon: Myrene Galas, MD;  Location: [facility] OR;  Service: Orthopedics;  Laterality: Left;   I & D EXTREMITY Left 07/07/2022   Procedure: IRRIGATION AND DEBRIDEMENT EXTREMITY;  Surgeon: Myrene Galas, MD;  Location: [facility] OR;  Service: Orthopedics;  Laterality: Left;   ORIF TIBIA PLATEAU Left 07/05/2022   Procedure: OPEN REDUCTION INTERNAL FIXATION (ORIF) TIBIAL PLATEAU;  Surgeon: Myrene Galas, MD;  Location: [facility] OR;  Service: Orthopedics;  Laterality: Left;   SECONDARY CLOSURE OF WOUND Left 07/05/2022   Procedure: SECONDARY CLOSURE OF  WOUND;  Surgeon: Myrene Galas, MD;  Location: [facility] OR;  Service: Orthopedics;  Laterality: Left;   SECONDARY CLOSURE OF WOUND Left 07/07/2022   Procedure: SECONDARY CLOSURE OF WOUND;  Surgeon: Myrene Galas, MD;  Location: [facility] OR;  Service: Orthopedics;  Laterality: Left;   SHOULDER SURGERY Bilateral     Assessment / Plan / Recommendation Clinical Impression  Ryan Lynch is a 41 year old right-handed male with history of anxiety maintained on Klonopin as well as Zoloft as well as alcohol use.  Per chart review lives with his parents.  Two-level home bed and bath main level.  Independent prior to admission.  Admitted 07/02/2022 after fall down approximately 8 steps.  Denied loss of consciousness.  Cranial CT scan showed broad-based right scalp hematoma without underlying skull fracture.  Negative noncontrast CT appearance of the brain.  CT cervical spine negative.  CT of the chest abdomen pelvis showed a T7 mild anterior vertebral body compression fracture with approximately 20% height loss.  No acute traumatic injury to the chest abdomen or pelvis.  Incidental finding of bilateral adrenal 16 mm left 10 mm right plan outpatient CT/MRI.  CT of the left tibia-fibula showed severely comminuted fracture of the lateral tibial plateau with 2 cm maximum depression of the articular surface with the depressed area measuring approximately 3 x 3.6 cm.  Fractures involves the medial lateral tibial eminence.  Nondisplaced intra-articular fracture of the posterior medial tibial plateau without significant depression.  Transverse fracture through the proximal tibial metaphysis without significant displacement as well as a large hemarthrosis..  Mild perifascial edema deep to the lateral gastrocnemius muscle with poor definition of the psoas  muscle.  Admission chemistries unremarkable except glucose 135, AST 188, ALT 59, alcohol negative, lactic acid 2.4.  Patient underwent emergent 4 compartment fasciotomy left leg 07/02/2022  per Dr. Carola Frost with close reduction left tibial plateau/application of spanning external fixator left knee/aspiration of left knee joint.  Nonweightbearing left lower extremity in place cardiac regular knee brace unlocked and only needs to be on when out of bed..  Surgical course complicated by traumatic rhabdomyolysis CK of 1513 improved to 405. He was cleared to begin Lovenox for DVT prophylaxis.  Conservative care advised for T7 compression fracture.  Hospital course complicated by ileus 8/21 nasogastric tube placed diet slowly advance patient had been on TNA.  Acute blood loss anemia 7.7 and monitored.  Course complicated by some agitation question alcohol withdrawal versus hepatic encephalopathy felt scenario most consistent with withdrawal and monitored.  Initial urinary retention patient has been placed on Flomax with plan to remove Foley tube.  Therapy evaluations completed due to patient decreased functional mobility was admitted for a comprehensive rehab program.  SLP evaluation was completed on 07/17/2022 with results as follows:   Bedside Swallow Evaluation Pt presents with no overt s/s of aspiration at bedside with presentations of his currently prescribed full liquids diet.  Oral mech exam was unremarkable for areas of focal weakness, vocal quality was clear, and pt denies any difficulty tolerating POs at this time.  SLP will defer decisions regarding further diet advancement to medical team due to history of ileus/possible SBO; however, SLP can re-evaluate if pt exhibits any difficulty tolerating solids once advanced.     Cognitive-linguistic Evaluation  Pt presents with mild higher level cognitive deficits and scored 20/30 on the SLUMS standardized cognitive assessment.  Deficits were most notable for recall of information, selective attention and attention to detail, and functional problem solving.  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   Medication effects are likely impacting performance as pt reports he's currently requiring frequent medication to manage pain related to injury.    Given the abovementioned deficits, pt would benefit from skilled ST while inpatient in order to maximize functional independence and reduce burden of care prior to discharge.  Anticipate that pt will need at least intermittent supervision and assistance for medication and financial management at discharge in addition to possible ST follow up at next level of care.      Skilled Therapeutic Interventions          Cognitive-linguistic and bedside swallow evaluation completed with results and recommendations reviewed with family.    SLP Assessment  Patient will need skilled Speech Lanaguage Pathology Services during CIR admission    Recommendations  SLP Diet Recommendations: Other (Comment);Thin (full liquids, may be advanced per medical team) Liquid Administration via: Cup;Straw Medication Administration: Other (Comment) (as tolerated) Supervision: Patient able to self feed Oral Care Recommendations: Oral care BID Recommendations for Other Services: Neuropsych consult Patient destination: Home Follow up Recommendations: Outpatient SLP Equipment Recommended: None recommended by SLP    SLP Frequency 3 to 5 out of 7 days   SLP Duration  SLP Intensity  SLP Treatment/Interventions 7-10 days  Minumum of 1-2 x/day, 30 to 90 minutes  Cognitive remediation/compensation;Cueing hierarchy;Patient/family education;Internal/external aids    Pain Pain Assessment Pain Scale: 0-10 Pain Score: 0-No pain  Prior Functioning Cognitive/Linguistic Baseline: Within functional limits (diagnosed ADD) Type of Home: House  Lives With: Family Available Help at Discharge: Family Education: college Vocation: Full time employment  SLP Evaluation Cognition Overall Cognitive Status: Impaired/Different from baseline Arousal/Alertness: Awake/alert Orientation  Level: Oriented X4 Attention: Selective Selective Attention: Impaired Selective Attention Impairment: Verbal complex Memory: Impaired Memory Impairment: Decreased recall of new information Awareness: Impaired Awareness Impairment: Anticipatory impairment Problem Solving: Impaired Problem Solving Impairment: Functional complex Executive Function: Self Correcting;Self Monitoring Self Monitoring: Impaired Self Monitoring Impairment: Functional complex Self Correcting: Impaired Self Correcting Impairment: Functional complex Safety/Judgment: Impaired  Comprehension Auditory Comprehension Overall Auditory Comprehension: Appears within functional limits for tasks assessed Expression Expression Primary Mode of Expression: Verbal Verbal Expression Overall Verbal Expression: Appears within functional limits for tasks assessed Oral Motor Oral Motor/Sensory Function Overall Oral Motor/Sensory Function: Within functional limits Motor Speech Overall Motor Speech: Appears within functional limits for tasks assessed  Care Tool Care Tool Cognition Ability to hear (with hearing aid or hearing appliances if normally used Ability to hear (with hearing aid or hearing appliances if normally used): 0. Adequate - no difficulty in normal conservation, social interaction, listening to TV   Expression of Ideas and Wants Expression of Ideas and Wants: 4. Without difficulty (complex and basic) - expresses complex messages without difficulty and with speech that is clear and easy to understand   Understanding Verbal and Non-Verbal Content Understanding Verbal and Non-Verbal Content: 4. Understands (complex and basic) - clear comprehension without cues or repetitions  Memory/Recall Ability Memory/Recall Ability : Current season;That he or she is in a hospital/hospital unit     Bedside Swallowing Assessment General Previous Swallow Assessment: none on record Diet Prior to this Study: Thin liquids;Other  (Comment) (full liquids diet) Temperature Spikes Noted: No Respiratory Status: Room air Behavior/Cognition: Alert;Cooperative;Pleasant mood Oral Cavity - Dentition: Adequate natural dentition Self-Feeding Abilities: Able to feed self Patient Positioning: Upright in bed Baseline Vocal Quality: Normal  Oral Care Assessment Level of Consciousness: Alert Ice Chips   Thin Liquid Thin Liquid: Within functional limits Presentation: Straw Nectar Thick   Honey Thick   Puree   Solid   BSE Assessment    Short Term Goals: Week 1: SLP Short Term Goal 1 (Week 1): STG=LTG due to ELOS  Refer to Care Plan for Long Term Goals  Recommendations for other services: Neuropsych  Discharge Criteria: Patient will be discharged from SLP if patient refuses treatment 3 consecutive times without medical reason, if treatment goals not met, if there is a change in medical status, if patient makes no progress towards goals or if patient is discharged from hospital.  The above assessment, treatment plan, treatment alternatives and goals were discussed and mutually agreed upon: by patient and by family  Juline Sanderford, Melanee Spry 07/17/2022, 11:00 AM

## 2022-07-17 NOTE — H&P (Signed)
Physical Medicine and Rehabilitation Admission H&P        Chief Complaint  Patient presents with   Fall  : HPI: Ryan Lynch is a 41 year old right-handed male with history of anxiety maintained on Klonopin as well as Zoloft as well as alcohol use.  Per chart review lives with his parents.  Two-level home bed and bath main level.  Independent prior to admission.  Admitted 07/02/2022 after fall down approximately 8 steps.  Denied loss of consciousness.  Cranial CT scan showed broad-based right scalp hematoma without underlying skull fracture.  Negative noncontrast CT appearance of the brain.  CT cervical spine negative.  CT of the chest abdomen pelvis showed a T7 mild anterior vertebral body compression fracture with approximately 20% height loss.  No acute traumatic injury to the chest abdomen or pelvis.  Incidental finding of bilateral adrenal 16 mm left 10 mm right plan outpatient CT/MRI.  CT of the left tibia-fibula showed severely comminuted fracture of the lateral tibial plateau with 2 cm maximum depression of the articular surface with the depressed area measuring approximately 3 x 3.6 cm.  Fractures involves the medial lateral tibial eminence.  Nondisplaced intra-articular fracture of the posterior medial tibial plateau without significant depression.  Transverse fracture through the proximal tibial metaphysis without significant displacement as well as a large hemarthrosis..  Mild perifascial edema deep to the lateral gastrocnemius muscle with poor definition of the psoas muscle.  Admission chemistries unremarkable except glucose 135, AST 188, ALT 59, alcohol negative, lactic acid 2.4.  Patient underwent emergent 4 compartment fasciotomy left leg 07/02/2022 per Dr. Carola Frost with close reduction left tibial plateau/application of spanning external fixator left knee/aspiration of left knee joint.  Nonweightbearing left lower extremity in place cardiac regular knee brace unlocked and only needs to be  on when out of bed..  Surgical course complicated by traumatic rhabdomyolysis CK of 1513 improved to 405. He was cleared to begin Lovenox for DVT prophylaxis.  Conservative care advised for T7 compression fracture.  Hospital course complicated by ileus 8/21 nasogastric tube placed diet slowly advance patient had been on TNA.  Acute blood loss anemia 7.7 and monitored.  Course complicated by some agitation question alcohol withdrawal versus hepatic encephalopathy felt scenario most consistent with withdrawal and monitored.  Initial urinary retention patient has been placed on Flomax with plan to remove Foley tube.  Therapy evaluations completed due to patient decreased functional mobility was admitted for a comprehensive rehab program.     Pt reports he's not drinking enough- ~ 4 cups/day.  Urine is dark- and also stinging some- wants foley out- advised will get out tomorrow AM LBM this AM- going well.  Failed voiding trials prior. Rate pain 9/10 with movement/PT- but at rest is 6/10- meds make him loopy so tries not to take the dialudid until after therapy.    Per nurse, he lost his child and as a result, wife and he divorced.  Was Secondary school teacher at [employer].        Review of Systems  Constitutional:  Negative for chills and fever.  HENT:  Negative for hearing loss.   Eyes:  Negative for blurred vision and double vision.  Respiratory:  Negative for cough and shortness of breath.   Cardiovascular:  Negative for chest pain, palpitations and leg swelling.  Gastrointestinal:  Positive for constipation and nausea. Negative for vomiting.  Genitourinary:  Negative for dysuria, flank pain and hematuria.  Musculoskeletal:  Positive for myalgias.  Skin:  Negative for rash.  All other systems reviewed and are negative.   History reviewed. No pertinent past medical history.      Past Surgical History:  Procedure Laterality Date   ANTERIOR CRUCIATE LIGAMENT REPAIR Right     APPLICATION OF  WOUND VAC Left 07/02/2022    Procedure: APPLICATION OF WOUND VAC;  Surgeon: Myrene Galas, MD;  Location: [facility] OR;  Service: Orthopedics;  Laterality: Left;   APPLICATION OF WOUND VAC Left 07/05/2022    Procedure: APPLICATION OF WOUND VAC;  Surgeon: Myrene Galas, MD;  Location: [facility] OR;  Service: Orthopedics;  Laterality: Left;   CLOSED REDUCTION TIBIA Left 07/02/2022    Procedure: CLOSED REDUCTION TIBIA;  Surgeon: Myrene Galas, MD;  Location: [facility] OR;  Service: Orthopedics;  Laterality: Left;   EXTERNAL FIXATION LEG Left 07/02/2022    Procedure: EXTERNAL FIXATION LEG;  Surgeon: Myrene Galas, MD;  Location: [facility] OR;  Service: Orthopedics;  Laterality: Left;   FASCIOTOMY Left 07/02/2022    Procedure: FASCIOTOMY;  Surgeon: Myrene Galas, MD;  Location: [facility] OR;  Service: Orthopedics;  Laterality: Left;   I & D EXTREMITY Left 07/07/2022    Procedure: IRRIGATION AND DEBRIDEMENT EXTREMITY;  Surgeon: Myrene Galas, MD;  Location: [facility] OR;  Service: Orthopedics;  Laterality: Left;   ORIF TIBIA PLATEAU Left 07/05/2022    Procedure: OPEN REDUCTION INTERNAL FIXATION (ORIF) TIBIAL PLATEAU;  Surgeon: Myrene Galas, MD;  Location: [facility] OR;  Service: Orthopedics;  Laterality: Left;   SECONDARY CLOSURE OF WOUND Left 07/05/2022    Procedure: SECONDARY CLOSURE OF WOUND;  Surgeon: Myrene Galas, MD;  Location: [facility] OR;  Service: Orthopedics;  Laterality: Left;   SECONDARY CLOSURE OF WOUND Left 07/07/2022    Procedure: SECONDARY CLOSURE OF WOUND;  Surgeon: Myrene Galas, MD;  Location: [facility] OR;  Service: Orthopedics;  Laterality: Left;   SHOULDER SURGERY Bilateral      History reviewed. No pertinent family history. Social History:  reports that he has never smoked. He does not have any smokeless tobacco history on file. He reports current alcohol use. He reports that he does not use drugs. Allergies: No Known Allergies       Medications Prior to Admission  Medication Sig Dispense Refill   clonazePAM (KLONOPIN) 1 MG tablet  Take 1 mg by mouth daily as needed for anxiety.       ibuprofen (ADVIL) 200 MG tablet Take 600 mg by mouth daily as needed (pain).       sertraline (ZOLOFT) 100 MG tablet Take 200 mg by mouth daily.              Home: Home Living Family/patient expects to be discharged to:: Private residence Living Arrangements: Parent Available Help at Discharge: Family Type of Home: House Home Access: Stairs to enter Secretary/administrator of Steps: 3 Entrance Stairs-Rails: Right, Left Home Layout: Two level, Able to live on main level with bedroom/bathroom Bathroom Shower/Tub: Health visitor: Standard Home Equipment: Crutches Additional Comments: reports hx of anxiety/depression   Functional History: Prior Function Prior Level of Function : Independent/Modified Independent   Functional Status:  Mobility: Bed Mobility Overal bed mobility: Needs Assistance Bed Mobility: Supine to Sit Rolling: Supervision Supine to sit: Min guard, HOB elevated Sit to supine: Min assist General bed mobility comments: Extra time, HOB elevated Transfers Overall transfer level: Needs assistance Equipment used: Rolling walker (2 wheels) Transfers: Sit to/from Stand, Bed to chair/wheelchair/BSC Sit to Stand: Min assist Bed to/from chair/wheelchair/BSC transfer type:: Step pivot Stand pivot transfers:  Max assist Step pivot transfers: Mod assist General transfer comment: MinA to steady pt with power up to stand, 1x from EOB and 1x from commode. Pt needing reminders to keep L leg off ground and push up from sitting surface rather than pull on RW. Pt pulling on RW to stand the first time impulsively. ModA for stability and RW management with hop to L bed > commode and to R commode > recliner, needing cues to keep RW on ground and to sequence steps of transfer. Ambulation/Gait Ambulation/Gait assistance: Mod assist Gait Distance (Feet): 6 Feet (x2 bouts of ~4 ft > ~6 ft) Assistive device: Rolling  walker (2 wheels) Gait Pattern/deviations:  (hop-to) General Gait Details: Pt with unsteady hop-to bettwen bed > commode > recliner using RW, requiring modA for stability and RW management. Pt needing cues to keep RW on ground as he would often lift or tilt it, impulsively trying to progress to hopping quicker before being cued or when cued to slow down. Gait velocity: decreased Gait velocity interpretation: <1.31 ft/sec, indicative of household ambulator   ADL: ADL Overall ADL's : Needs assistance/impaired Eating/Feeding: NPO Eating/Feeding Details (indicate cue type and reason): noted to have roughly 1 inch from the nose marker on the tubing has descended from the tip of patient nose. Rn made aware of concern that tubing has externally progressed. Pt also noted to have a few clearing of throats and cough. RN voiced she will check placement Grooming: Modified independent, Wash/dry face, Sitting Upper Body Bathing: Min guard, Cueing for safety, Sitting Lower Body Bathing: Moderate assistance, Sitting/lateral leans Upper Body Dressing : Min guard, Sitting Lower Body Dressing: Total assistance Lower Body Dressing Details (indicate cue type and reason): total (A) to don bledsoe brace Toilet Transfer: Maximal assistance, Stand-pivot, BSC/3in1, Rolling walker (2 wheels) Toilet Transfer Details (indicate cue type and reason): pt requires single step commands to progress from EOB to [facility] going toward the R side. pt with increased time and having pt repeat back the steps to ensure understanding prior to attempt. Pt with multiple lines and leads at this time Toileting- Architect and Hygiene: Total assistance Toileting - Clothing Manipulation Details (indicate cue type and reason): pt able to static standin NWB LLE for peri care by OT. pt returning to sitting on [facility] for OT to setup environment. Pt sit<>STand and recliner placed behind the pt for upright sitting in chair. Functional mobility  during ADLs: +2 for safety/equipment, Minimal assistance, Rolling walker (2 wheels) General ADL Comments: Recommendation for two person (A) with RW to pivot toward R return to bed or dropping chair arm and sliding onto bed surface. pt is able to bridge buttock to (A)> pt in chair on chair alarm at this time   Cognition: Cognition Overall Cognitive Status: Impaired/Different from baseline Orientation Level: Oriented X4 Cognition Arousal/Alertness: Awake/alert Behavior During Therapy: Impulsive, Restless Overall Cognitive Status: Impaired/Different from baseline Area of Impairment: Safety/judgement, Following commands, Attention, Awareness, Memory, Problem solving Orientation Level: Disoriented to, Time Current Attention Level: Sustained Memory: Decreased recall of precautions, Decreased short-term memory Following Commands: Follows one step commands with increased time, Follows one step commands consistently, Follows multi-step commands inconsistently Safety/Judgement: Decreased awareness of safety, Decreased awareness of deficits Awareness: Emergent Problem Solving: Slow processing, Difficulty sequencing, Requires verbal cues General Comments: Pt impulsive to try to stand prior to therapist being fully ready, likely due to wanting to "show off" for his parents that were present. Pt needing simple step-by-step cues to sequence RW and hopping.  Needs reminders to keep RW on ground. Poor attention span.   Physical Exam: Blood pressure 109/68, pulse 99, temperature 98 F (36.7 C), temperature source Oral, resp. rate 16, height 5\' 11"  (1.803 m), weight 91.6 kg, SpO2 98 %. Physical Exam Vitals and nursing note reviewed. Exam conducted with a chaperone present.  Constitutional:      Appearance: He is ill-appearing.     Comments: Awake, alert, pale; supine in bed; dad at bedside; NAD  HENT:     Head: Normocephalic and atraumatic.     Right Ear: External ear normal.     Left Ear: External ear  normal.     Nose: Nose normal. No congestion.     Mouth/Throat:     Mouth: Mucous membranes are dry.     Pharynx: Oropharynx is clear. No oropharyngeal exudate.  Eyes:     General:        Right eye: No discharge.        Left eye: No discharge.     Extraocular Movements: Extraocular movements intact.  Cardiovascular:     Rate and Rhythm: Regular rhythm. Tachycardia present.     Heart sounds: Normal heart sounds. No murmur heard.    No gallop.     Comments: Rate in low 100s Pulmonary:     Effort: Pulmonary effort is normal. No respiratory distress.     Breath sounds: Normal breath sounds. No wheezing, rhonchi or rales.  Abdominal:     General: Bowel sounds are normal. There is no distension.     Palpations: Abdomen is soft.     Tenderness: There is no abdominal tenderness.  Genitourinary:    Comments: Foley in place- has dark amber urine- like dehydrated Musculoskeletal:     Cervical back: Neck supple. No tenderness.     Comments: UE strength 5/5 RLE 5/5 in HF, KE, KF DF and PF LLE- HF 4/5; KE- due to pain, but is at least 3/5; DF/PF at least 4/5- not foot drop   Fasciotomy sites on LLE- below knee Sutures in place- no significant drainage seen- localized erythema, but likely due to sutures   Skin:    General: Skin is warm and dry.     Comments: Dressing in place left lower extremity with knee brace  A lot of psoriasis plaques- L thigh- very large; R shin near ankle; multiple smaller spots around incisions on LLE and abdomen and back Said was using baking soda and vaseline   Neurological:     Comments: Patient is alert.  Follows full commands.  Provides name and age.  Did have some difficulty with biographical information.   Ox3 Decreased sensation to light touch in LLE below knee- but not absent- otherwise LT intact in other 3 limbs  Psychiatric:     Comments: Depressed affect, but interactive        Lab Results Last 48 Hours        Results for orders placed or  performed during the hospital encounter of 07/02/22 (from the past 48 hour(s))  Glucose, capillary     Status: Abnormal    Collection Time: 07/13/22 11:35 AM  Result Value Ref Range    Glucose-Capillary 119 (H) 70 - 99 mg/dL      Comment: Glucose reference range applies only to samples taken after fasting for at least 8 hours.  Glucose, capillary     Status: Abnormal    Collection Time: 07/13/22  3:03 PM  Result Value Ref Range    Glucose-Capillary  127 (H) 70 - 99 mg/dL      Comment: Glucose reference range applies only to samples taken after fasting for at least 8 hours.  Glucose, capillary     Status: Abnormal    Collection Time: 07/13/22  8:24 PM  Result Value Ref Range    Glucose-Capillary 124 (H) 70 - 99 mg/dL      Comment: Glucose reference range applies only to samples taken after fasting for at least 8 hours.  Magnesium     Status: None    Collection Time: 07/14/22  6:05 AM  Result Value Ref Range    Magnesium 2.1 1.7 - 2.4 mg/dL      Comment: Performed at [facility], [address], [place], [place] [address]  Phosphorus     Status: None    Collection Time: 07/14/22  6:05 AM  Result Value Ref Range    Phosphorus 3.9 2.5 - 4.6 mg/dL      Comment: Performed at [facility], [address] [place], [place] [address]  Triglycerides     Status: Abnormal    Collection Time: 07/14/22  6:05 AM  Result Value Ref Range    Triglycerides 151 (H) <150 mg/dL      Comment: Performed at [facility], [address] [place], [place] [address]  Comprehensive metabolic panel     Status: Abnormal    Collection Time: 07/14/22  6:05 AM  Result Value Ref Range    Sodium 136 135 - 145 mmol/L    Potassium 3.9 3.5 - 5.1 mmol/L    Chloride 103 98 - 111 mmol/L    CO2 25 22 - 32 mmol/L    Glucose, Bld 138 (H) 70 - 99 mg/dL      Comment: Glucose reference range applies only to samples taken after fasting for at least 8 hours.    BUN 8 6 - 20 mg/dL    Creatinine, Ser 3.23  0.61 - 1.24 mg/dL    Calcium 8.6 (L) 8.9 - 10.3 mg/dL    Total Protein 6.0 (L) 6.5 - 8.1 g/dL    Albumin 2.6 (L) 3.5 - 5.0 g/dL    AST 37 15 - 41 U/L    ALT 27 0 - 44 U/L    Alkaline Phosphatase 324 (H) 38 - 126 U/L    Total Bilirubin 1.5 (H) 0.3 - 1.2 mg/dL    GFR, Estimated >55 >73 mL/min      Comment: (NOTE) Calculated using the CKD-EPI Creatinine Equation (2021)      Anion gap 8 5 - 15      Comment: Performed at [facility], [address] [place], [place] [address]  CBC     Status: Abnormal    Collection Time: 07/14/22  6:05 AM  Result Value Ref Range    WBC 6.7 4.0 - 10.5 K/uL    RBC 2.95 (L) 4.22 - 5.81 MIL/uL    Hemoglobin 8.2 (L) 13.0 - 17.0 g/dL    HCT 42.7 (L) 06.2 - 52.0 %    MCV 89.5 80.0 - 100.0 fL    MCH 27.8 26.0 - 34.0 pg    MCHC 31.1 30.0 - 36.0 g/dL    RDW 37.6 (H) 28.3 - 15.5 %    Platelets 157 150 - 400 K/uL    nRBC 0.7 (H) 0.0 - 0.2 %      Comment: Performed at [facility], [address] [place], [place]  [address]  Glucose, capillary     Status: Abnormal    Collection Time: 07/14/22  7:54 AM  Result Value Ref Range    Glucose-Capillary 146 (H) 70 - 99 mg/dL      Comment: Glucose reference range applies only to samples taken after fasting for at least 8 hours.  Glucose, capillary     Status: Abnormal    Collection Time: 07/14/22 11:18 AM  Result Value Ref Range    Glucose-Capillary 134 (H) 70 - 99 mg/dL      Comment: Glucose reference range applies only to samples taken after fasting for at least 8 hours.  Glucose, capillary     Status: Abnormal    Collection Time: 07/14/22  4:17 PM  Result Value Ref Range    Glucose-Capillary 133 (H) 70 - 99 mg/dL      Comment: Glucose reference range applies only to samples taken after fasting for at least 8 hours.  Glucose, capillary     Status: Abnormal    Collection Time: 07/14/22  8:04 PM  Result Value Ref Range    Glucose-Capillary 126 (H) 70 - 99 mg/dL      Comment: Glucose reference range  applies only to samples taken after fasting for at least 8 hours.  Glucose, capillary     Status: Abnormal    Collection Time: 07/14/22 11:38 PM  Result Value Ref Range    Glucose-Capillary 128 (H) 70 - 99 mg/dL      Comment: Glucose reference range applies only to samples taken after fasting for at least 8 hours.  Glucose, capillary     Status: Abnormal    Collection Time: 07/15/22  3:12 AM  Result Value Ref Range    Glucose-Capillary 139 (H) 70 - 99 mg/dL      Comment: Glucose reference range applies only to samples taken after fasting for at least 8 hours.  Comprehensive metabolic panel     Status: Abnormal    Collection Time: 07/15/22  4:25 AM  Result Value Ref Range    Sodium 136 135 - 145 mmol/L    Potassium 3.9 3.5 - 5.1 mmol/L    Chloride 102 98 - 111 mmol/L    CO2 24 22 - 32 mmol/L    Glucose, Bld 144 (H) 70 - 99 mg/dL      Comment: Glucose reference range applies only to samples taken after fasting for at least 8 hours.    BUN 8 6 - 20 mg/dL    Creatinine, Ser 4.17 0.61 - 1.24 mg/dL    Calcium 8.8 (L) 8.9 - 10.3 mg/dL    Total Protein 5.5 (L) 6.5 - 8.1 g/dL    Albumin 2.4 (L) 3.5 - 5.0 g/dL    AST 33 15 - 41 U/L    ALT 25 0 - 44 U/L    Alkaline Phosphatase 321 (H) 38 - 126 U/L    Total Bilirubin 1.2 0.3 - 1.2 mg/dL    GFR, Estimated >40 >81 mL/min      Comment: (NOTE) Calculated using the CKD-EPI Creatinine Equation (2021)      Anion gap 10 5 - 15      Comment: Performed at [facility], [address], [place], [place] [address]  CBC     Status: Abnormal    Collection Time: 07/15/22  4:25 AM  Result Value Ref Range    WBC 7.7 4.0 - 10.5 K/uL    RBC 2.73 (L) 4.22 - 5.81 MIL/uL  Hemoglobin 7.7 (L) 13.0 - 17.0 g/dL    HCT 95.6 (L) 21.3 - 52.0 %    MCV 87.2 80.0 - 100.0 fL    MCH 28.2 26.0 - 34.0 pg    MCHC 32.4 30.0 - 36.0 g/dL    RDW 08.6 (H) 57.8 - 15.5 %    Platelets 168 150 - 400 K/uL    nRBC 1.4 (H) 0.0 - 0.2 %      Comment: Performed at [facility], [address], [place], [place] [address]      Imaging Results (Last 48 hours)  No results found.         Blood pressure 109/68, pulse 99, temperature 98 F (36.7 C), temperature source Oral, resp. rate 16, height 5\' 11"  (1.803 m), weight 91.6 kg, SpO2 98 %.   Medical Problem List and Plan: 1. Functional deficits secondary to left tibial plateau fracture with compartment syndrome complicated by traumatic rhabdomyolysis.  Status post closed reduction as well as aspiration left knee and 4 compartment fasciotomy 07/01/2022.  Nonweightbearing with hinged knee brace when out of bed unlocked.             -patient may  shower if cover incisions- so cannot get wet             -ELOS/Goals: 7-10 days mod I to supervision 2.  Antithrombotics: -DVT/anticoagulation:  Pharmaceutical: Lovenox check vascular study             -antiplatelet therapy: N/A 3. Pain Management: Oxycodone as needed- change from dilaudid 4. Mood/Behavior/Sleep as well as history of anxiety: Klonopin 2 mg twice daily             -antipsychotic agents: N/A 5. Neuropsych/cognition: This patient is capable of making decisions on his own behalf. 6. Skin/Wound Care: Routine skin checks 7. Fluids/Electrolytes/Nutrition: Routine in and out with follow up chemestries 8.  Acute blood loss anemia.  Follow-up CBC 9.  Ileus.  Weaned from 07/03/2022.  Advance diet as tolerated. 10.  Traumatic rhabdomyolysis.  CK1 513 improved to 405 11.  History of alcohol use.  Alcohol level negative on admission.  Monitor for withdrawal. 12.  Urinary retention.  Flomax.  Discontinue Foley tube check PVR.- will get out Sunday Am and cath if required- pt rather cath- add lidocaine jelly prn for caths-  13.  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  Follow-up outpatient CT/MRI 14. Psoriasis- will add betamethasone cream BID to plaques- esp since they interfere with his incisions on LLE.  15. Will remove PICC_  not using. .      I have personally performed a face to face diagnostic evaluation of this patient and formulated the key components of the plan.  Additionally, I have personally reviewed laboratory data, imaging studies, as well as relevant notes and concur with the physician assistant's documentation above.   The patient's status has not changed from the original H&P.  Any changes in documentation from the acute care chart have been noted above.       Thursday Angiulli, PA-C 07/15/2022

## 2022-07-17 NOTE — Progress Notes (Signed)
RUE PICC removed per order.  No signs of infection noted.  Cleaned with CHG, covered with vaseline guaze and dry 2x2.  No signs  of bleeding present.

## 2022-07-17 NOTE — Progress Notes (Signed)
Physical Therapy Assessment and Plan  Patient Details  Name: Ryan Lynch MRN: [id number] Date of Birth: 29-Oct-1981  PT Diagnosis: Abnormality of gait, Cognitive deficits, Coordination disorder, Difficulty walking, Impaired cognition, Impaired sensation, Muscle weakness, and Pain in joint Rehab Potential: Good ELOS: 7-10 days   Today's Date: 07/17/2022 PT Individual Time: 0800-0859 PT Individual Time Calculation (min): 59 min    Hospital Problem: Principal Problem:   Closed nondisplaced fracture of left tibial plateau Active Problems:   Traumatic compartment syndrome [facility])   Past Medical History: History reviewed. No pertinent past medical history. Past Surgical History:  Past Surgical History:  Procedure Laterality Date   ANTERIOR CRUCIATE LIGAMENT REPAIR Right    APPLICATION OF WOUND VAC Left 07/02/2022   Procedure: APPLICATION OF WOUND VAC;  Surgeon: Altamese [place] F[place], MD;  Location: [place];  Service: Orthopedics;  Laterality: Left;   APPLICATION OF WOUND VAC Left 07/05/2022   Procedure: APPLICATION OF WOUND VAC;  Surgeon: Altamese [place], MD;  Location: [place];  Service: Orthopedics;  Laterality: Left;   CLOSED REDUCTION TIBIA Left 07/02/2022   Procedure: CLOSED REDUCTION TIBIA;  Surgeon: Altamese [place], MD;  Location: [place];  Service: Orthopedics;  Laterality: Left;   EXTERNAL FIXATION LEG Left 07/02/2022   Procedure: EXTERNAL FIXATION LEG;  Surgeon: Altamese [place], MD;  Location: [place];  Service: Orthopedics;  Laterality: Left;   FASCIOTOMY Left 07/02/2022   Procedure: FASCIOTOMY;  Surgeon: Altamese [place], MD;  Location: [place];  Service: Orthopedics;  Laterality: Left;   I & D EXTREMITY Left 07/07/2022   Procedure: IRRIGATION AND DEBRIDEMENT EXTREMITY;  Surgeon: Altamese [place], MD;  Location: [place];  Service: Orthopedics;  Laterality: Left;   ORIF TIBIA PLATEAU Left 07/05/2022   Procedure: OPEN REDUCTION INTERNAL FIXATION (ORIF) TIBIAL PLATEAU;  Surgeon: Altamese Doffing, MD;  Location: [place];  Service: Orthopedics;  Laterality: Left;   SECONDARY CLOSURE OF WOUND Left 07/05/2022   Procedure: SECONDARY CLOSURE OF WOUND;  Surgeon: Altamese [place], MD;  Location: [place];  Service: Orthopedics;  Laterality: Left;   SECONDARY CLOSURE OF WOUND Left 07/07/2022   Procedure: SECONDARY CLOSURE OF WOUND;  Surgeon: Altamese Kaanapali, MD;  Location: [place];  Service: Orthopedics;  Laterality: Left;   SHOULDER SURGERY Bilateral     Assessment & Plan Clinical Impression: Patient is a 41 y.o. year old male with history of anxiety maintained on Klonopin as well as Zoloft as well as alcohol use.  Per chart review lives with his parents.  Two-level home bed and bath main level.  Independent prior to admission.  Admitted 07/02/2022 after fall down approximately 8 steps.  Denied loss of consciousness.  Cranial CT scan showed broad-based right scalp hematoma without underlying skull fracture.  Negative noncontrast CT appearance of the brain.  CT cervical spine negative.  CT of the chest abdomen pelvis showed a T7 mild anterior vertebral body compression fracture with approximately 20% height loss.  No acute traumatic injury to the chest abdomen or pelvis.  Incidental finding of bilateral adrenal 16 mm left 10 mm right plan outpatient CT/MRI.  CT of the left tibia-fibula showed severely comminuted fracture of the lateral tibial plateau with 2 cm maximum depression of the articular surface with the depressed area measuring approximately 3 x 3.6 cm.  Fractures involves the medial lateral tibial eminence.  Nondisplaced intra-articular fracture of the posterior medial tibial plateau without significant depression.  Transverse fracture through the proximal tibial metaphysis without significant displacement as well as a large hemarthrosis..  Mild  perifascial edema deep to the lateral gastrocnemius muscle with poor definition of the psoas muscle.  Admission chemistries unremarkable except glucose 135, AST 188, ALT 59, alcohol  negative, lactic acid 2.4.  Patient underwent emergent 4 compartment fasciotomy left leg 07/02/2022 per Dr. Marcelino Scot with close reduction left tibial plateau/application of spanning external fixator left knee/aspiration of left knee joint.  Nonweightbearing left lower extremity in place cardiac regular knee brace unlocked and only needs to be on when out of bed..  Surgical course complicated by traumatic rhabdomyolysis CK of 1513 improved to 405. He was cleared to begin Lovenox for DVT prophylaxis.  Conservative care advised for T7 compression fracture.  Hospital course complicated by ileus 1/61 nasogastric tube placed diet slowly advance patient had been on TNA.  Acute blood loss anemia 7.7 and monitored.  Course complicated by some agitation question alcohol withdrawal versus hepatic encephalopathy felt scenario most consistent with withdrawal and monitored.  Initial urinary retention patient has been placed on Flomax with plan to remove Foley tube.  Therapy evaluations completed due to patient decreased functional mobility was admitted for a comprehensive rehab program.Patient transferred to CIR on 07/16/2022 .   Patient currently requires max with mobility secondary to muscle weakness, decreased cardiorespiratoy endurance, unbalanced muscle activation, decreased coordination, and decreased motor planning, decreased awareness, decreased problem solving, decreased safety awareness, and delayed processing, and decreased standing balance and decreased postural control.  Prior to hospitalization, patient was independent  with mobility and lived with Family in a House home.  Home access is 3Stairs to enter.  Patient will benefit from skilled PT intervention to maximize safe functional mobility, minimize fall risk, and decrease caregiver burden for planned discharge home with 24 hour supervision.  Anticipate patient will benefit from follow up OP at discharge.  PT - End of Session Activity Tolerance: Tolerates 30+ min  activity with multiple rests Endurance Deficit: Yes Endurance Deficit Description: impaired PT Assessment Rehab Potential (ACUTE/IP ONLY): Good PT Barriers to Discharge: Inaccessible home environment;Home environment access/layout;Weight bearing restrictions PT Barriers to Discharge Comments: house with 3 steps to enter with bilateral rails. 2 levels, able to live on main PT Patient demonstrates impairments in the following area(s): Balance;Pain;Behavior;Safety;Endurance;Sensory;Skin Integrity;Motor PT Transfers Functional Problem(s): Bed Mobility;Bed to Chair;Car PT Locomotion Functional Problem(s): Ambulation;Wheelchair Mobility PT Plan PT Intensity: Minimum of 1-2 x/day ,45 to 90 minutes PT Frequency: 5 out of 7 days PT Duration Estimated Length of Stay: 7-10 days PT Treatment/Interventions: Ambulation/gait training;Cognitive remediation/compensation;Discharge planning;DME/adaptive equipment instruction;Functional mobility training;Pain management;Psychosocial support;Therapeutic Activities;UE/LE Strength taining/ROM;Balance/vestibular training;Community reintegration;Disease management/prevention;Neuromuscular re-education;Patient/family education;Stair training;Therapeutic Exercise;UE/LE Coordination activities;Wheelchair propulsion/positioning PT Transfers Anticipated Outcome(s): Supervision PT Locomotion Anticipated Outcome(s): Supervision PT Recommendation Recommendations for Other Services: Neuropsych consult Follow Up Recommendations: Outpatient PT Patient destination: Home Equipment Recommended: To be determined Equipment Details: none   PT Evaluation Precautions/Restrictions Precautions Precautions: Fall Required Braces or Orthoses: Other Brace (knee brace unlocked) Knee Immobilizer - Left: Other (comment) (brace on for out of obed activities) Other Brace: hinged knee brace, unlocked on when OOB able to d/c supine per ortho notes 8/28 Restrictions Weight Bearing  Restrictions: Yes LLE Weight Bearing: Non weight bearing Other Position/Activity Restrictions: no ROM restrictions Pain Interference Pain Interference Pain Effect on Sleep: 3. Frequently Pain Interference with Therapy Activities: 2. Occasionally Pain Interference with Day-to-Day Activities: 2. Occasionally Home Living/Prior Functioning Home Living Available Help at Discharge: Family;Other (Comment);Available 24 hours/day (mother is retired and Engineer, mining) Type of Home: House Home Access: Stairs to enter [employer] of Steps: 3 Entrance  Stairs-Rails: Right;Left Home Layout: Two level;Able to live on main level with bedroom/bathroom Alternate Level Stairs-Number of Steps: 13 Alternate Level Stairs-Rails: Right;Left Bathroom Shower/Tub: Tub/shower unit;Walk-in shower Bathroom Toilet: Standard Additional Comments: reports hx of anxiety/depression  Lives With: Family Prior Function Level of Independence: Independent with gait;Independent with homemaking with ambulation;Independent with transfers  Able to Take Stairs?: Yes Driving: Yes Vocation: Full time employment Vision/Perception     Cognition Overall Cognitive Status: Impaired/Different from baseline Arousal/Alertness: Awake/alert Orientation Level: Oriented X4 Attention: Selective Selective Attention: Impaired Selective Attention Impairment: Verbal complex Memory: Impaired Memory Impairment: Decreased recall of new information Awareness: Impaired Awareness Impairment: Anticipatory impairment Problem Solving: Impaired Problem Solving Impairment: Functional complex Executive Function: Self Correcting;Self Monitoring Self Monitoring: Impaired Self Monitoring Impairment: Functional complex Self Correcting: Impaired Self Correcting Impairment: Functional complex Safety/Judgment: Impaired Sensation Sensation Light Touch: Impaired Detail Peripheral sensation comments: impaired and diminshed right L3 and L4,  right S1 numb s/p ACL surgery 15 years ago Light Touch Impaired Details: Impaired RLE;Impaired LLE Proprioception: Appears Intact Coordination Gross Motor Movements are Fluid and Coordinated: No Fine Motor Movements are Fluid and Coordinated: No Coordination and Movement Description: UE hand tremors and grossly uncoordinated due to pain, strength deficits and balance deficits Finger Nose Finger Test: UE intention tremor present Heel Shin Test: impaired Left LE due to pain and strength deficits Motor  Motor Motor: Other (comment) (grossly impaired due to pain, weakness, decreased activity tolerance and balance/coordiantion deficits) Motor - Skilled Clinical Observations: grossly impaired due to pain, weakness, decreased activity tolerance and balance/coordiantion deficits   Trunk/Postural Assessment  Cervical Assessment Cervical Assessment: Within Functional Limits Thoracic Assessment Thoracic Assessment: Within Functional Limits Lumbar Assessment Lumbar Assessment: Within Functional Limits Postural Control Postural Control: Deficits on evaluation (delayed reactions)  Balance Balance Balance Assessed: Yes Static Sitting Balance Static Sitting - Balance Support: Feet supported Static Sitting - Level of Assistance: 5: Stand by assistance (supervision) Dynamic Sitting Balance Dynamic Sitting - Balance Support: During functional activity;Feet supported Dynamic Sitting - Level of Assistance: 5: Stand by assistance (supervision) Static Standing Balance Static Standing - Balance Support: Bilateral upper extremity supported;During functional activity Static Standing - Level of Assistance: 4: Min assist Dynamic Standing Balance Dynamic Standing - Balance Support: During functional activity;Bilateral upper extremity supported Dynamic Standing - Level of Assistance: 4: Min assist Dynamic Standing - Balance Activities: Lateral lean/weight shifting Extremity Assessment      RLE  Assessment RLE Assessment: Within Functional Limits General Strength Comments: grossly 5/5 MMT LLE Assessment LLE Assessment: Exceptions to [facility] General Strength Comments: grossly 3/5 limited due to pain  Care Tool Care Tool Bed Mobility Roll left and right activity   Roll left and right assist level: Supervision/Verbal cueing    Sit to lying activity   Sit to lying assist level: Supervision/Verbal cueing    Lying to sitting on side of bed activity   Lying to sitting on side of bed assist level: the ability to move from lying on the back to sitting on the side of the bed with no back support.: Supervision/Verbal cueing     Care Tool Transfers Sit to stand transfer   Sit to stand assist level: Moderate Assistance - Patient 50 - 74%    Chair/bed transfer   Chair/bed transfer assist level: Minimal Assistance - Patient > 75%     Physiological scientist transfer assist level: Minimal Assistance - Patient > 75%  Care Tool Locomotion Ambulation   Assist level: Moderate Assistance - Patient 50 - 74% Assistive device: Other (comment) (rail and HHA) Max distance: 4 feet  Walk 10 feet activity Walk 10 feet activity did not occur: Safety/medical concerns (unable to perform due to pain, weakness, balance/coordination deficits and decreased activity tolerance)       Walk 50 feet with 2 turns activity Walk 50 feet with 2 turns activity did not occur: Safety/medical concerns (unable to perform due to pain, weakness, balance/coordination deficits and decreased activity tolerance)      Walk 150 feet activity Walk 150 feet activity did not occur: Safety/medical concerns (unable to perform due to pain, weakness, balance/coordination deficits and decreased activity tolerance)      Walk 10 feet on uneven surfaces activity   Assist level: Minimal Assistance - Patient > 75% Assistive device: Walker-rolling  Stairs Stair activity did not occur: Safety/medical concerns  (unable to perform due to pain, weakness, balance/coordination deficits and decreased activity tolerance)        Walk up/down 1 step activity Walk up/down 1 step or curb (drop down) activity did not occur: Safety/medical concerns (unable to perform due to pain, weakness, balance/coordination deficits and decreased activity tolerance)      Walk up/down 4 steps activity Walk up/down 4 steps activity did not occur: Safety/medical concerns (unable to perform due to pain, weakness, balance/coordination deficits and decreased activity tolerance)      Walk up/down 12 steps activity Walk up/down 12 steps activity did not occur: Safety/medical concerns (unable to perform due to pain, weakness, balance/coordination deficits and decreased activity tolerance)      Pick up small objects from floor Pick up small object from the floor (from standing position) activity did not occur: Safety/medical concerns (unable to perform due to pain, weakness, balance/coordination deficits and decreased activity tolerance)      Wheelchair Is the patient using a wheelchair?: Yes Type of Wheelchair: Manual   Wheelchair assist level: Total Assistance - Patient < 25% Max wheelchair distance: 200 feet  Wheel 50 feet with 2 turns activity   Assist Level: Total Assistance - Patient < 25%  Wheel 150 feet activity   Assist Level: Total Assistance - Patient < 25%    Refer to Care Plan for Long Term Goals  SHORT TERM GOAL WEEK 1 PT Short Term Goal 1 (Week 1): STG=LTG due to ELOS  Recommendations for other services: Neuropsych  Skilled Therapeutic Intervention  Pt received semi-reclined in bed and agreeable to PT session. Pt on bed pan and requested assistance with removal and clean up. Pt requires (S) with rolling and dependent for peri-care as pt was continent of bowel. PT dependently donned knee brace and pt (S) with supine <>sit. Pt required minimal assist for lower body dressing and threading left LE through pant  legs with brace on. Pt reported 8.5/10 pain and PT notified nursing and pt was medicated. PT assessed pain interference, sensation, coordination, and strength while pt seated edge of bed and requires (S) for static and dynamic sitting balance. Pt requires mod A with sit to stand with no AD and HHA and min A with squat pivot from bed to w/c. Pt transported by w/c to outside of main gym for time management. Pt ambulated 4 feet with right side rail and HHA in L UE and maintained L LE NWB restrictions. Pt provided with RW and progressed to ambulation of 27 feet min A with hop to gait pattern. Pt slightly impulsive with mobility and  requires frequent cueing for safety and AD management. Pt transported to ortho gym by w/c and requires min A for car transfer and ramp negotiation with RW. Pt transported to room and required min A for squat pivot to bed. Pt left semi-reclined in bed with all needs in reach and bed alarm on. PT educated patient regarding CIR policies/procedures and updated safety plan.   Mobility Bed Mobility Bed Mobility: Rolling Right;Rolling Left;Supine to Sit;Sit to Supine Rolling Right: Supervision/verbal cueing Rolling Left: Supervision/Verbal cueing Supine to Sit: Supervision/Verbal cueing Sit to Supine: Supervision/Verbal cueing Transfers Transfers: Sit to Stand;Stand to Sit;Squat Pivot Transfers Sit to Stand: Moderate Assistance - Patient 50-74% Stand to Sit: Moderate Assistance - Patient 50-74% Squat Pivot Transfers: Minimal Assistance - Patient > 75% Transfer (Assistive device): 1 person hand held assist Locomotion  Gait Ambulation: Yes Gait Assistance: Minimal Assistance - Patient > 75% Gait Distance (Feet): 27 Feet (feet) Assistive device: Rolling walker Gait Assistance Details: Verbal cues for precautions/safety;Verbal cues for safe use of DME/AE;Verbal cues for sequencing;Visual cues/gestures for precautions/safety Gait Assistance Details: verbal cues for weight bearing  and safety as pt slightly impulsive Gait Gait: Yes Gait Pattern: Impaired Gait Pattern: Shuffle;Poor foot clearance - right Gait velocity: decreased Stairs / Additional Locomotion Stairs: No Curb:  (unable to perform due to pain, weakness, decreased activity tolerance and balance/coordiantion deficits) Wheelchair Mobility Wheelchair Mobility: No   Discharge Criteria: Patient will be discharged from PT if patient refuses treatment 3 consecutive times without medical reason, if treatment goals not met, if there is a change in medical status, if patient makes no progress towards goals or if patient is discharged from hospital.  The above assessment, treatment plan, treatment alternatives and goals were discussed and mutually agreed upon: by patient  Tanja Port PT, DPT  07/17/2022, 12:33 PM

## 2022-07-17 NOTE — Op Note (Signed)
5:28 PM 07/17/2022 07/07/2022  PATIENT:  Ryan Lynch  41 y.o. male  [id number]  PRE-OPERATIVE DIAGNOSIS:   1. LEFT BICONDYLAR TIBIAL PLATEAU FRACTURE 2. LEFT TIBIAL SPINE FRACTURE 3. RETAINED EXTERNAL FIXATOR 4. OPEN MEDIAL AND LATERAL FASCIOTOMIES LEFT LEG  POST-OPERATIVE DIAGNOSIS: 1. LEFT BICONDYLAR TIBIAL PLATEAU FRACTURE 2. LEFT TIBIAL SPINE FRACTURE 3. RETAINED EXTERNAL FIXATOR 4. OPEN MEDIAL AND LATERAL FASCIOTOMIES LEFT LEG 5. LEFT LATERAL MENISCUS TEAR  PROCEDURE:   1. ORIF OF LEFT BICONDYLAR TIBIAL PLATEAU FRACTURE  2. ORIF TIBIAL EMINENCE 3. ARTHROTOMY WITH PARTIAL MENISCECTOMY LEFT LATERAL MENISCUS 4. REMOVAL OF EXTERNAL FIXATOR UNDER ANESTHESIA 5. RETENTION SUTURE CLOSURE 23 CM LATERAL FASCIOTOMY 6. RETENTION SUTURE PARTIAL CLOSURE AND DRESSING CHANGE UNDER ANESTHESIA MEDIAL FASCIOTOMY  SURGEON:  Surgeon(s) and Role:    Myrene Galas, MD - Primary  PHYSICIAN ASSISTANT: None  ANESTHESIA:   general  I/O:  Total I/O In: -  Out: 1575 [Urine:1575]  SPECIMEN:  None  TOURNIQUET:  * No tourniquets in log *  DICTATION: .Note written in EPIC  DISPOSITION: PACU  CONDITION: STABLE     BRIEF SUMMARY AND INDICATION FOR PROCEDURE:  Patient is a 41 y.o.-year- old with a tibial plateau fracture requiring emergent fasciotomy and external fixation. He is in alcohol withdrawal. With his father, I did discuss the risks and benefits of surgical treatment including the potential for arthritis, nerve injury, vessel injury, loss of motion, DVT, PE, heart attack, stroke, symptomatic hardware, need for further surgery, and multiple others.  He acknowledged these risks and provided consent to proceed.   BRIEF SUMMARY OF PROCEDURE:  After administration of preoperative antibiotics, the patient was taken to the operating room.  General anesthesia was induced. The lower extremity prepped and draped in usual sterile fashion using a chlorhexidine wash and betadine scrub and paint. A  timeout was performed.  We did retain the fixator to protect the neurovascular structures during prepping. Once time-out was held, the leg was isolated from the clamps with towels and the fixator removed, and pin sites were irrigated thoroughly. Additional Betadine paint was performed, Ioban used to isolate them from the surgical field, and new gloves obtained by operative staff.  I then brought in the radiolucent triangle.  A curvilinear incision was made extending laterally over Gerdy's tubercle. Dissection was carried down where the soft tissues were left intact to the lateral plateau and rim.  I did incise the retinaculum proximal to the tibial plateau, and then going along inside the retinaculum performed a submeniscal arthrotomy, releasing the coronary ligament along its insertion onto the tibia. Zero prolene suture was used to reflect this and inspect the meniscus and joint surface. The joint was irrigated thoroughly and this revealed the lateral meniscus tear. I used four vertical mattress sutures to repair this back to the capsule securely. The joint surface was markedly depressed.  We then released some of the anterior extensors to enable the plate to fit along the proximal shaft. A trapdoor was made in the lateral tibial metaphysis.  Through this, I introduced a series of tamps and with my assistant pulling traction, I was able to elevate the articular surface in sequential fashion while watching it through the arthrotomy.  I was able to mobilize the eminence segment into reduction through the arthrotomy and secure it between the lateral and medial portions of the plateau. Once I had restored appropriate height to the lateral plateau, the bone defect was grafted with cancellous chips.  I then placed the plate laterally  and used the [employer] clamp to apply a compressive force across the joint line. This reduced the widened plateau back to the appropriate size.  At this point, we placed  standard fixation in the proximal row of the plate followed by locked fixation.  The defect in the metaphysis was filled with cancellous bone and tamped into place. This was followed by additional fixation within the shaft. My assistant was careful to control alignment throughout by using traction and bending forces. He also assited with retraction. All wounds were irrigated thoroughly. Final AP and LAT fluoro images showed restoration of alignment, reduction, and varus/ valgus stability on stress view.   Once more, wound was irrigated and then a standard layered closure performed, 0 Vicryl, 2-0 Vicryl, and 2-0 nylon for the skin of this surgical wound.    Attention was then turned to the fasciotomies. The wounds were irrigated thoroughly and contractility assessed in the anterior, lateral, and posterior compartments, all of which showed brisk contractility and excellent perfusion. The potential for closure existed though remained in question.  After the thorough irrigation, we began a stepwise far-near near-far retention suture closure and reapproximation of the wound as this progressed from proximal to distal and then distal to proximal.  We were eventually able to achieve full closure of the 23 cm lateral side.  #1 nylon far-near near-far sutures were supplemented with 2-0 nylon far-near near-far retention sutures.    Medial sided swelling precluded closure but we were able to use retention sutures over 15 cm to advance toward subsequent closure before placing Mepitel and then a wound VAC dressing over this portion of the wound.  On the lateral side, Mepitel and gauze were applied.   Sterile gently compressive dressing was applied and in knee immobilizer.  The patient was taken to the PACU in stable condition.   PROGNOSIS: The patient will be transitioned into a hinged knee brace with unrestricted range of motion and this will begin immediately.  Return to OR in two days for closure of  the medial side. Withdrawal continues to be a concern. Orders entered for nonweightbearing on the operative extremity, pharmacologic DVT prophylaxis, and mobilization with PT and OT. After discharge, we will plan to see the patient back in about 2 weeks for removal of sutures and we will continue to follow throughout the hospital stay.         Doralee Albino. Carola Frost, M.D.

## 2022-07-18 DIAGNOSIS — D62 Acute posthemorrhagic anemia: Secondary | ICD-10-CM | POA: Diagnosis not present

## 2022-07-18 DIAGNOSIS — S82145D Nondisplaced bicondylar fracture of left tibia, subsequent encounter for closed fracture with routine healing: Secondary | ICD-10-CM | POA: Diagnosis not present

## 2022-07-18 DIAGNOSIS — R339 Retention of urine, unspecified: Secondary | ICD-10-CM

## 2022-07-18 DIAGNOSIS — T79A22D Traumatic compartment syndrome of left lower extremity, subsequent encounter: Secondary | ICD-10-CM | POA: Diagnosis not present

## 2022-07-18 DIAGNOSIS — R17 Unspecified jaundice: Secondary | ICD-10-CM

## 2022-07-18 LAB — COMPREHENSIVE METABOLIC PANEL
ALT: 24 U/L (ref 0–44)
AST: 30 U/L (ref 15–41)
Albumin: 2.7 g/dL — ABNORMAL LOW (ref 3.5–5.0)
Alkaline Phosphatase: 407 U/L — ABNORMAL HIGH (ref 38–126)
Anion gap: 7 (ref 5–15)
BUN: 8 mg/dL (ref 6–20)
CO2: 22 mmol/L (ref 22–32)
Calcium: 8.5 mg/dL — ABNORMAL LOW (ref 8.9–10.3)
Chloride: 107 mmol/L (ref 98–111)
Creatinine, Ser: 0.87 mg/dL (ref 0.61–1.24)
GFR, Estimated: >60 mL/min (ref >60)
Glucose, Bld: 110 mg/dL — ABNORMAL HIGH (ref 70–99)
Potassium: 3.8 mmol/L (ref 3.5–5.1)
Sodium: 136 mmol/L (ref 135–145)
Total Bilirubin: 1.3 mg/dL — ABNORMAL HIGH (ref 0.3–1.2)
Total Protein: 6.3 g/dL — ABNORMAL LOW (ref 6.5–8.1)

## 2022-07-18 LAB — CBC WITH DIFFERENTIAL/PLATELET
Abs Immature Granulocytes: 0.2 10*3/uL — ABNORMAL HIGH (ref 0.00–0.07)
Basophils Absolute: 0 10*3/uL (ref 0.0–0.1)
Basophils Relative: 1 %
Eosinophils Absolute: 0.1 10*3/uL (ref 0.0–0.5)
Eosinophils Relative: 2 %
HCT: 28 % — ABNORMAL LOW (ref 39.0–52.0)
Hemoglobin: 8.6 g/dL — ABNORMAL LOW (ref 13.0–17.0)
Immature Granulocytes: 5 %
Lymphocytes Relative: 27 %
Lymphs Abs: 1.1 10*3/uL (ref 0.7–4.0)
MCH: 28.5 pg (ref 26.0–34.0)
MCHC: 30.7 g/dL (ref 30.0–36.0)
MCV: 92.7 fL (ref 80.0–100.0)
Monocytes Absolute: 0.4 10*3/uL (ref 0.1–1.0)
Monocytes Relative: 8 %
Neutro Abs: 2.4 10*3/uL (ref 1.7–7.7)
Neutrophils Relative %: 57 %
Platelets: 155 10*3/uL (ref 150–400)
RBC: 3.02 MIL/uL — ABNORMAL LOW (ref 4.22–5.81)
RDW: 19.4 % — ABNORMAL HIGH (ref 11.5–15.5)
WBC: 4.2 10*3/uL (ref 4.0–10.5)
nRBC: 0 % (ref 0.0–0.2)

## 2022-07-18 MED ORDER — DOXYCYCLINE HYCLATE 100 MG PO TABS
100.0000 mg | ORAL_TABLET | Freq: Two times a day (BID) | ORAL | Status: DC
Start: 2022-07-18 — End: 2022-07-23
  Administered 2022-07-18 – 2022-07-23 (×10): 100 mg via ORAL
  Filled 2022-07-18 (×11): qty 1

## 2022-07-18 NOTE — Progress Notes (Signed)
Inpatient Rehabilitation Care Coordinator Assessment and Plan Patient Details  Name: Ryan Lynch MRN: [id number] Date of Birth: 07/12/1981  Today's Date: 07/18/2022  Hospital Problems: Principal Problem:   Closed nondisplaced fracture of left tibial plateau Active Problems:   Traumatic compartment syndrome [facility])  Past Medical History: History reviewed. No pertinent past medical history. Past Surgical History:  Past Surgical History:  Procedure Laterality Date   ANTERIOR CRUCIATE LIGAMENT REPAIR Right    APPLICATION OF WOUND VAC Left 07/02/2022   Procedure: APPLICATION OF WOUND VAC;  Surgeon: Myrene Galas, MD;  Location: [facility] OR;  Service: Orthopedics;  Laterality: Left;   APPLICATION OF WOUND VAC Left 07/05/2022   Procedure: APPLICATION OF WOUND VAC;  Surgeon: Myrene Galas, MD;  Location: [facility] OR;  Service: Orthopedics;  Laterality: Left;   CLOSED REDUCTION TIBIA Left 07/02/2022   Procedure: CLOSED REDUCTION TIBIA;  Surgeon: Myrene Galas, MD;  Location: [facility] OR;  Service: Orthopedics;  Laterality: Left;   EXTERNAL FIXATION LEG Left 07/02/2022   Procedure: EXTERNAL FIXATION LEG;  Surgeon: Myrene Galas, MD;  Location: [facility] OR;  Service: Orthopedics;  Laterality: Left;   FASCIOTOMY Left 07/02/2022   Procedure: FASCIOTOMY;  Surgeon: Myrene Galas, MD;  Location: [facility] OR;  Service: Orthopedics;  Laterality: Left;   I & D EXTREMITY Left 07/07/2022   Procedure: IRRIGATION AND DEBRIDEMENT EXTREMITY;  Surgeon: Myrene Galas, MD;  Location: [facility] OR;  Service: Orthopedics;  Laterality: Left;   ORIF TIBIA PLATEAU Left 07/05/2022   Procedure: OPEN REDUCTION INTERNAL FIXATION (ORIF) TIBIAL PLATEAU;  Surgeon: Myrene Galas, MD;  Location: [facility] OR;  Service: Orthopedics;  Laterality: Left;   SECONDARY CLOSURE OF WOUND Left 07/05/2022   Procedure: SECONDARY CLOSURE OF WOUND;  Surgeon: Myrene Galas, MD;  Location: [facility] OR;  Service: Orthopedics;  Laterality: Left;   SECONDARY CLOSURE OF WOUND Left 07/07/2022    Procedure: SECONDARY CLOSURE OF WOUND;  Surgeon: Myrene Galas, MD;  Location: [facility] OR;  Service: Orthopedics;  Laterality: Left;   SHOULDER SURGERY Bilateral    Social History:  reports that he has never smoked. He does not have any smokeless tobacco history on file. He reports current alcohol use. He reports that he does not use drugs.  Family / Support Systems Marital Status: Separated How Long?: over one year? Pt did not answer the question Patient Roles: Other (Comment) (son) Other Supports: Michael-Dad [phone] Lori-Mom [phone] Anticipated Caregiver: Mom is in good health-Dad is recovering from back surgery Ability/Limitations of Caregiver: MOm will be assisting if needed Caregiver Availability: 24/7 Family Dynamics: Close with parents and a few friends. He was doing well up until his fall. 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 Legal History/Current Legal Issues: No issues Guardian/Conservator: None-according to MD pt is capable of making his own decisions while here   Abuse/Neglect Abuse/Neglect Assessment Can Be Completed: Yes Physical Abuse: Denies Verbal Abuse: Denies Sexual Abuse: Denies Exploitation of patient/patient's resources: Denies Self-Neglect: Denies  Patient response to: Social Isolation - How often do you feel lonely or isolated from those around you?: Never  Emotional Status Pt's affect, behavior and adjustment status: Pt is motivated to move and feels tried from first  therapy today. He is aware of his WB issues but seems to put weight down on his leg.  He seems distracted and has to be re-directed a lot regarding the conversation. Recent Psychosocial Issues: other health issues Psychiatric History: History of anxiety takes medications for this. 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 Will ask neuro-psych to see while here Substance Abuse History: History of ETOH reports has not drank since 2016 but MD on acute watching for withdrawals since admission  Patient / Family Perceptions, Expectations & Goals Pt/Family understanding of illness & functional limitations: Pt is able to explain his injuries and does talk with the MD while rounding. He hopes to do well here and will try but is tired and it is hard for him. Premorbid pt/family roles/activities: son, friend, etc Anticipated changes in roles/activities/participation: resume Pt/family expectations/goals: Pt states: " I want to be mobile and think I will be at discharge from here."  [employer]: None Premorbid Home Care/DME Agencies: None Transportation available at discharge: parents Is the patient able to respond to transportation needs?: Yes In the past 12 months, has lack of transportation kept you from medical appointments or from getting medications?: No In the past 12 months, has lack of transportation kept you from meetings, work, or from getting things needed for daily living?: No Resource referrals recommended: Neuropsychology  Discharge Planning Living Arrangements: Parent Support Systems: Parent, Friends/neighbors Type of Residence: Private residence Insurance Resources: Media planner (specify) Herbalist) Financial Resources: Family Support Financial Screen Referred: No Living Expenses: Lives with family Money Management: Family Does the patient have any problems obtaining your medications?: No Home Management: parents Patient/Family Preliminary Plans:  Return home with parents, his MOm is in good health and can assist if needed. His Dad has had recent back surgery and is still recovering from this. Aware of goals of supervision-mod/i level and ELOS 7-10 days Care Coordinator Barriers to Discharge: Insurance for SNF coverage, Decreased caregiver support, Weight bearing restrictions Care Coordinator Anticipated Follow Up Needs: HH/OP  Clinical Impression Pleasant but scattered gentleman had difficulty staying on topic and answering the questions that were asked. Lives with parents and Mom can assist if needed at discharge. 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 Will work on discharge needs.  [name] 07/18/2022, 9:24 AM

## 2022-07-18 NOTE — Discharge Instructions (Addendum)
Inpatient Rehab Discharge Instructions  Ryan Lynch Discharge date and time: No discharge date for patient encounter.   Activities/Precautions/ Functional Status: Activity: Nonweightbearing left lower extremity with hinged knee brace when out of bed Diet: Regular Wound Care: Leave all wounds open to air.  Okay to shower with soap and water Functional status:  ___ No restrictions     ___ Walk up steps independently ___ 24/7 supervision/assistance   ___ Walk up steps with assistance ___ Intermittent supervision/assistance  ___ Bathe/dress independently ___ Walk with walker     _x__ Bathe/dress with assistance ___ Walk Independently    ___ Shower independently ___ Walk with assistance    ___ Shower with assistance ___ No alcohol     ___ Return to work/school ________  Special Instructions: No driving smoking or alcohol    COMMUNITY REFERRALS UPON DISCHARGE:    Outpatient: SP               Agency:[facility] [address] [place] [address] Phone: [phone]             Appointment Date/Time: WILL CALL PARENTS TO SET UP APPOINTMENT  WILL NEED OPPT ONCE CAN WEIGHT BEAR ON LEG  Medical Equipment/Items Ordered:WHEELCHAIR  & TUB BENCH TO [name]                                                  Agency/Supplier:[facility]   [phone]     My questions have been answered and I understand these instructions. I will adhere to these goals and the provided educational materials after my discharge from the hospital.  Patient/Caregiver Signature _______________________________ Date __________  Clinician Signature _______________________________________ Date __________  Please bring this form and your medication list with you to all your follow-up doctor's appointments.

## 2022-07-18 NOTE — Progress Notes (Signed)
Inpatient Rehabilitation Center Individual Statement of Services  Patient Name:  Ryan Lynch  Date:  07/18/2022  Welcome to the Inpatient Rehabilitation Center.  Our goal is to provide you with an individualized program based on your diagnosis and situation, designed to meet your specific needs.  With this comprehensive rehabilitation program, you will be expected to participate in at least 3 hours of rehabilitation therapies Monday-Friday, with modified therapy programming on the weekends.  Your rehabilitation program will include the following services:  Physical Therapy (PT), Occupational Therapy (OT), Speech Therapy (ST), 24 hour per day rehabilitation nursing, Therapeutic Recreaction (TR), Neuropsychology, Care Coordinator, Rehabilitation Medicine, Nutrition Services, and Pharmacy Services  Weekly team conferences will be held on Wednesday to discuss your progress.  Your Inpatient Rehabilitation Care Coordinator will talk with you frequently to get your input and to update you on team discussions.  Team conferences with you and your family in attendance may also be held.  Expected length of stay: 7-10 days  Overall anticipated outcome: supervision-mod/I level with device  Depending on your progress and recovery, your program may change. Your Inpatient Rehabilitation Care Coordinator will coordinate services and will keep you informed of any changes. Your Inpatient Rehabilitation Care Coordinator's name and contact numbers are listed  below.  The following services may also be recommended but are not provided by the Inpatient Rehabilitation Center:   Home Health Rehabiltiation Services Outpatient Rehabilitation Services Vocational Rehabilitation   Arrangements will be made to provide these services after discharge if needed.  Arrangements include referral to agencies that provide these services.  Your insurance has been verified to be:  [employer] Your primary doctor is:  none  Pertinent  information will be shared with your doctor and your insurance company.  Inpatient Rehabilitation Care Coordinator:  Dossie Der, [place] [phone] or Luna Glasgow  Information discussed with and copy given to patient by: [name], 07/18/2022, 9:26 AM

## 2022-07-18 NOTE — Progress Notes (Signed)
Physical Therapy Session Note  Patient Details  Name: Ryan Lynch MRN: [id number] Date of Birth: 11/04/1981  Today's Date: 07/18/2022 PT Individual Time: 1300-1355 PT Individual Time Calculation (min): 55 min   Short Term Goals: Week 1:  PT Short Term Goal 1 (Week 1): STG=LTG due to ELOS  Skilled Therapeutic Interventions/Progress Updates:   Received pt semi-reclined in bed, pt agreeable to PT treatment, and reported pain 8.5/10 in LLE (premedicated). Session with emphasis on functional mobility/transfers, generalized strengthening and endurance, dynamic standing balance/coordination, and gait training. Donned L knee hinge brace in long sitting with min A and transferred semi-reclined<>sitting EOB with HOB elevated and supervision. Doffed dirty shirt and donned clean one and shoes with supervision. Pt transferred bed<>WC stand<>pivot with RW and CGA while adhering to LLE NWB precautions. Pt performed WC mobility >350ft x 2 trials using BUE and supervision to/from dayroom with emphasis on UE strength and endurance - min cues for propulsion technique.  Pt transferred sit<>stand with RW and CGA and pt ambulated 12ft x 1, 23ft x 1, and 46ft x 1 with RW and min A with close WC follow - cues to decrease speed and to increase R foot clearance - overall good adherence to LLE NWB precautions. Pt requested to work on arm exercises due to reports of 10/10 fatigue in LEs. Pt performed the following exercises with supervision and verbal cues for technique with emphasis on UE strength: -WC pushups 2x10 -bicep curls 2x15 with 12lb dowel -overhead shoulder press<>horizontal chest press with 5lb dowel 2x10 -tricep extensions with 5lb dumbbell 2x10 bilaterally Returned to room and requested to return to bed. Squat<>pivot WC<>bed to R with CGA and sit<>supine with supervision. Doffed L knee hinge brace and shoes without assist. Concluded session with pt supine in bed, needs within reach, and bed alarm on. Provided pt  with fresh water.   Therapy Documentation Precautions:  Precautions Precautions: Fall Required Braces or Orthoses: Other Brace (knee brace unlocked) Knee Immobilizer - Left: Other (comment) Other Brace: hinged knee brace, unlocked on when OOB able to d/c supine per ortho notes 8/28 Restrictions Weight Bearing Restrictions: Yes LLE Weight Bearing: Non weight bearing Other Position/Activity Restrictions: no ROM restrictions  Therapy/Group: Individual Therapy Martin Majestic PT, DPT  07/18/2022, 7:10 AM

## 2022-07-18 NOTE — Progress Notes (Signed)
Ryan Heys, MD  Physician Physical Medicine and Rehabilitation PMR Pre-admission     Signed Date of Service:  07/13/2022  1:05 PM  Related encounter: ED to Hosp-Admission (Discharged) from 07/02/2022 in [place]      PMR Admission Coordinator Pre-Admission Assessment   Patient: Ryan Lynch is an 41 y.o., male MRN: [id number] DOB: [birth year] Height: 5' 11"  (180.3 cm) Weight: 91.6 kg   Insurance Information HMO:     PPO: yes     PCP:      IPA:      80/20:      OTHER:  PRIMARY: BCBS      Policy#: [id number]      Subscriber: pt CM Name: Ryan Lynch      Phone#: [phone]     Fax#: [phone] Pre-Cert#: [id number]    approved until 9/11 when updates are due  Employer:  Benefits:  Phone #: [phone]     Name:  Eff. Date: 11/14/21     Deduct: $1800      Out of Pocket Max: $9100      Life Max: n/a CIR: 70%     SNF: 70% 60 days Outpatient: $40     Co-Pay: 30 combined visits Home Health: 70%      Co-Pay: 30% DME: 70%     Co-Pay: 30% Providers: in network  SECONDARY:       Policy#:      Phone#:    Development worker, community:       Phone#:    The Engineer, petroleum" for patients in Inpatient Rehabilitation Facilities with attached "Privacy Act [place] Records" was provided and verbally reviewed with: N/A   Emergency Contact Information Contact Information       Name Relation Home Work Mobile    Ryan Lynch Father     [phone]    Ryan Lynch, NEECE   [phone]             Current Medical History  Patient Admitting Diagnosis: debility    History of Present Illness:  Ryan Lynch is a 41 year old right-handed male with history of anxiety maintained on Klonopin as well as Zoloft as well as alcohol use.   Admitted 07/02/2022 after fall down approximately 8 steps.  Denied loss of consciousness.     Cranial CT scan showed broad-based right scalp hematoma without underlying skull fracture.  Negative noncontrast CT appearance of  the brain.  CT cervical spine negative.  CT of the chest abdomen pelvis showed a T7 mild anterior vertebral body compression fracture with approximately 20% height loss.  No acute traumatic injury to the chest abdomen or pelvis.  Incidental finding of bilateral adrenal 16 mm left 10 mm right plan outpatient CT/MRI.  CT of the left tibia-fibula showed severely comminuted fracture of the lateral tibial plateau with 2 cm maximum depression of the articular surface with the depressed area measuring approximately 3 x 3.6 cm.  Fractures involves the medial lateral tibial eminence.  Nondisplaced intra-articular fracture of the posterior medial tibial plateau without significant depression.  Transverse fracture through the proximal tibial metaphysis without significant displacement as well as a large hemarthrosis..  Mild perifascial edema deep to the lateral gastrocnemius muscle with poor definition of the psoas muscle.  Admission chemistries unremarkable except glucose 135, AST 188, ALT 59, alcohol negative, lactic acid 2.4.  Patient underwent emergent 4 compartment fasciotomy left leg 07/02/2022 per Dr. Marcelino Lynch with close reduction left tibial plateau/application of spanning external fixator left knee/aspiration of left  knee joint.  Nonweightbearing left lower extremity in place cardiac regular knee brace unlocked and only needs to be on when out of bed..  Surgical course complicated by traumatic rhabdomyolysis CK of 1513 improved to 405. He was cleared to begin Lovenox for DVT prophylaxis.  Conservative care advised for T7 compression fracture.  Hospital course complicated by ileus 1/61 nasogastric tube placed diet slowly advance patient had been on TNA.  Acute blood loss anemia 7.7 and monitored.  Course complicated by some agitation question alcohol withdrawal versus hepatic encephalopathy felt scenario most consistent with withdrawal and monitored.  Initial urinary retention patient has been placed on Flomax with plan to  remove Foley tube.     Patient's medical record from Ryan Lynch has been reviewed by the rehabilitation admission coordinator and physician.   Past Medical History  History reviewed. No pertinent past medical history.   Has the patient had major surgery during 100 days prior to admission? Yes   Family History   family history is not on file.   Current Medications   Current Facility-Administered Medications:    acetaminophen (TYLENOL) tablet 325-650 mg, 325-650 mg, Oral, Q6H PRN, Ryan Spinner, PA-C   Chlorhexidine Gluconate Cloth 2 % PADS 6 each, 6 each, Topical, Daily, Ryan Spinner, PA-C, 6 each at 07/15/22 1001   cholecalciferol (VITAMIN D3) 25 MCG (1000 UNIT) tablet 2,000 Units, 2,000 Units, Oral, BID, Ryan Spinner, PA-C, 2,000 Units at 07/15/22 2058   clonazePAM (KLONOPIN) tablet 2 mg, 2 mg, Oral, BID, Ryan Sells, MD, 2 mg at 07/15/22 2058   enoxaparin (LOVENOX) injection 40 mg, 40 mg, Subcutaneous, Daily, Ryan Lynch., MD, 40 mg at 07/15/22 1001   feeding supplement (BOOST / RESOURCE BREEZE) liquid 1 Container, 1 Container, Oral, TID BM, Ryan Sells, MD, 1 Container at 07/15/22 1308   HYDROmorphone (DILAUDID) injection 0.5-1 mg, 0.5-1 mg, Intravenous, Q4H PRN, Ryan Spinner, PA-C, 1 mg at 07/16/22 0459   ondansetron (ZOFRAN) tablet 4 mg, 4 mg, Oral, Q6H PRN **OR** ondansetron (ZOFRAN) injection 4 mg, 4 mg, Intravenous, Q6H PRN, Ryan Spinner, PA-C, 4 mg at 07/09/22 0960   oxyCODONE-acetaminophen (PERCOCET/ROXICET) 5-325 MG per tablet 1-2 tablet, 1-2 tablet, Oral, Q4H PRN, Ryan Sells, MD, 2 tablet at 07/15/22 1941   sodium chloride flush (NS) 0.9 % injection 10-40 mL, 10-40 mL, Intracatheter, Q12H, Ryan Lynch., MD, 10 mL at 07/15/22 2059   sodium chloride flush (NS) 0.9 % injection 10-40 mL, 10-40 mL, Intracatheter, PRN, Ryan Lynch., MD   tamsulosin [facility]) capsule 0.4 mg, 0.4 mg, Oral, QPC supper, Ryan Lynch, Jai-Gurmukh, MD, 0.4 mg at  07/15/22 1820   TPN ADULT (ION), , Intravenous, Continuous TPN, Bell, Lorin C, RPH, Last Rate: 50 mL/hr at 07/15/22 1839, New Bag at 07/15/22 1839   Patients Current Diet:  Diet Order                  Diet full liquid Room service appropriate? Yes; Fluid consistency: Thin  Diet effective now                       Precautions / Restrictions Precautions Precautions: Fall Precaution Comments: watch vitals Other Brace: hinged knee brace, unlocked on when OOB able to d/c supine per ortho notes 8/28 Restrictions Weight Bearing Restrictions: Yes LLE Weight Bearing: Non weight bearing Other Position/Activity Restrictions: no ROM restrictions    Has the patient had 2 or more falls or a fall with injury in the  past year? Yes   Prior Activity Level Community (5-7x/wk): independent, working FT from home, driving, no DME   Prior Functional Level Self Care: Did the patient need help bathing, dressing, using the toilet or eating? Independent   Indoor Mobility: Did the patient need assistance with walking from room to room (with or without device)? Independent   Stairs: Did the patient need assistance with internal or external stairs (with or without device)? Independent   Functional Cognition: Did the patient need help planning regular tasks such as shopping or remembering to take medications? Independent   Patient Information Are you of Hispanic, Latino/a,or Spanish origin?: A. No, not of Hispanic, Latino/a, or Spanish origin What is your race?: A. White Do you need or want an interpreter to communicate with a doctor or health care staff?: 0. No   Patient's Response To:  Health Literacy and Transportation Is the patient able to respond to health literacy and transportation needs?: Yes Health Literacy - How often do you need to have someone help you when you read instructions, pamphlets, or other written material from your doctor or pharmacy?: Never In the past 12 months, has lack of  transportation kept you from medical appointments or from getting medications?: No In the past 12 months, has lack of transportation kept you from meetings, work, or from getting things needed for daily living?: No   Development worker, international aid / [place] Devices/Equipment: None Home Equipment: Crutches   Prior Device Use: Indicate devices/aids used by the patient prior to current illness, exacerbation or injury? None of the above   Current Functional Level Cognition   Overall Cognitive Status: Impaired/Different from baseline Current Attention Level: Selective Orientation Level: Oriented X4 Following Commands: Follows one step commands with increased time, Follows one step commands consistently, Follows multi-step commands inconsistently Safety/Judgement: Decreased awareness of safety, Decreased awareness of deficits General Comments: Pt following cues more appropriately and consistently today, seeming to carryover cues on how to safely manage RW. Decreased impulsivity, but continues to require cues to slow down at times.    Extremity Assessment (includes Sensation/Coordination)   Upper Extremity Assessment: Overall WFL for tasks assessed  Lower Extremity Assessment: Defer to PT evaluation LLE Deficits / Details: currently with bledsoe brace. pt able to help lift LLE to place in brace     ADLs   Overall ADL's : Needs assistance/impaired Eating/Feeding: NPO Eating/Feeding Details (indicate cue type and reason): noted to have roughly 1 inch from the nose marker on the tubing has descended from the tip of patient nose. Rn made aware of concern that tubing has externally progressed. Pt also noted to have a few clearing of throats and cough. RN voiced she will check placement Grooming: Modified independent, Wash/dry face, Sitting Upper Body Bathing: Min guard, Cueing for safety, Sitting Lower Body Bathing: Moderate assistance, Sitting/lateral leans Upper Body Dressing : Min  guard, Sitting Lower Body Dressing: Total assistance Lower Body Dressing Details (indicate cue type and reason): total (A) to don bledsoe brace Toilet Transfer: Maximal assistance, Stand-pivot, BSC/3in1, Rolling walker (2 wheels) Toilet Transfer Details (indicate cue type and reason): pt requires single step commands to progress from EOB to [facility] going toward the R side. pt with increased time and having pt repeat back the steps to ensure understanding prior to attempt. Pt with multiple lines and leads at this time Toileting- Water quality scientist and Hygiene: Total assistance Toileting - Clothing Manipulation Details (indicate cue type and reason): pt able to static standin NWB LLE for  peri care by OT. pt returning to sitting on [facility] for OT to setup environment. Pt sit<>STand and recliner placed behind the pt for upright sitting in chair. Functional mobility during ADLs: +2 for safety/equipment, Minimal assistance, Rolling walker (2 wheels) General ADL Comments: Recommendation for two person (A) with RW to pivot toward R return to bed or dropping chair arm and sliding onto bed surface. pt is able to bridge buttock to (A)> pt in chair on chair alarm at this time     Mobility   Overal bed mobility: Needs Assistance Bed Mobility: Supine to Sit Rolling: Supervision Supine to sit: HOB elevated, Supervision Sit to supine: Min assist General bed mobility comments: Extra time, HOB elevated     Transfers   Overall transfer level: Needs assistance Equipment used: Rolling walker (2 wheels) Transfers: Sit to/from Stand Sit to Stand: Min assist Bed to/from chair/wheelchair/BSC transfer type:: Step pivot Stand pivot transfers: Max assist Step pivot transfers: Mod assist General transfer comment: MinA to steady pt with power up to stand, 1x from EOB and 1x from recliner. Pt needing reminders to push up from sitting surface rather than pull on RW.     Ambulation / Gait / Stairs / Wheelchair Mobility    Ambulation/Gait Ambulation/Gait assistance: Mod assist, Min assist Gait Distance (Feet): 28 Feet (x2 bouts of ~20 ft > ~28 ft) Assistive device: Rolling walker (2 wheels) Gait Pattern/deviations:  (hop-to) General Gait Details: Pt following cues to slow down and keep RW on ground better today. Needs cues and increased assistance for balance when turning and hopping posteriorly. Min-modA for stability and cues to remain within RW. Gait velocity: decreased Gait velocity interpretation: <1.31 ft/sec, indicative of household ambulator     Posture / Balance Dynamic Sitting Balance Sitting balance - Comments: Prefers UE support Balance Overall balance assessment: Needs assistance Sitting-balance support: No upper extremity supported, Feet supported, Bilateral upper extremity supported Sitting balance-Leahy Scale: Fair Sitting balance - Comments: Prefers UE support Standing balance support: Bilateral upper extremity supported, During functional activity, Reliant on assistive device for balance Standing balance-Leahy Scale: Poor Standing balance comment: Reliant on RW and up to [place] needs/care consideration      Previous Home Environment  Living Arrangements: Parent Available Help at Discharge: Family Type of Home: House Home Layout: Two level, Able to live on main level with bedroom/bathroom Home Access: Stairs to enter Entrance Stairs-Rails: Right, Left Entrance Stairs-Number of Steps: 3 Bathroom Shower/Tub: Multimedia programmer: Standard Home Care Services: No Additional Comments: reports hx of anxiety/depression   Discharge Living Setting Plans for Discharge Living Setting: Lives with (comment) (parents) Type of Home at Discharge: House Discharge Home Layout: Able to live on main level with bedroom/bathroom Discharge Home Access: Stairs to enter Entrance Stairs-Rails: Right, Left Entrance Stairs-Number of Steps: 3 Discharge Bathroom Shower/Tub: Walk-in  shower Discharge Bathroom Toilet: Standard Discharge Bathroom Accessibility: Yes How Accessible: Accessible via walker Does the patient have any problems obtaining your medications?: No   Social/Family/Support Systems Anticipated Caregiver: parents, Legrand Como (dad) and Cecille Rubin (mom) Anticipated Caregiver's Contact Information: Legrand Como [phone] Ability/Limitations of Caregiver: n/a Caregiver Availability: 24/7 Discharge Plan Discussed with Primary Caregiver: Yes Is Caregiver In Agreement with Plan?: Yes Does Caregiver/Family have Issues with Lodging/Transportation while Pt is in Rehab?: No   Goals Patient/Family Goal for Rehab: PT/OT supervision to mod I, SLP supervision Expected length of stay: 7-10 Additional Information: pt with ADD/anxiety and medications have been stopped during acute stay.  Will  need to ensure he's on everything he needs to be on prior to d/c Pt/Family Agrees to Admission and willing to participate: Yes Program Orientation Provided & Reviewed with Pt/Caregiver Including Roles  & Responsibilities: Yes  Barriers to Discharge: Behavior, Insurance for SNF coverage   Decrease burden of Care through IP rehab admission: n/a   Possible need for SNF placement upon discharge: Not anticipated   Patient Condition: I have reviewed medical records from [facility], spoken with  [facility] team , and patient and family member. I met with patient at the bedside and discussed via phone for inpatient rehabilitation assessment.  Patient will benefit from ongoing PT, OT, and SLP, can actively participate in 3 hours of therapy a day 5 days of the week, and can make measurable gains during the admission.  Patient will also benefit from the coordinated team approach during an Inpatient Acute Rehabilitation admission.  The patient will receive intensive therapy as well as Rehabilitation physician, nursing, social worker, and care management interventions.  Due to safety, skin/wound care, disease  management, medication administration, pain management, and patient education the patient requires 24 hour a day rehabilitation nursing.  The patient is currently mod assist overall with mobility and basic ADLs.  Discharge setting and therapy post discharge at  home  is anticipated.  Patient has agreed to participate in the Acute Inpatient Rehabilitation Program and will admit Saturday..    Preadmission Screen Completed By: Shann Medal, PT, DPT and  updates by Cleatrice Burke, 07/16/2022 9:56 AM ______________________________________________________________________   Discussed status with Dr. Dagoberto Ligas on 07/14/22 at 1500 and received approval for admission Saturday 07/15/22   Admission Coordinator: Shann Medal, PT, DPT and updates by Cleatrice Burke, RN, time 1500 Date 07/15/2022    Assessment/Plan: Diagnosis: Does the need for close, 24 hr/day Medical supervision in concert with the patient's rehab needs make it unreasonable for this patient to be served in a less intensive setting? Yes Co-Morbidities requiring supervision/potential complications: EtOh encephalopathy- improving; fall down stairs- rib fx's tibial palateau fx s/p ORIF and fasciootomy for compartment syndrome- urinary retention- needs voiding trial; anxiet-y on Klonopin Due to bladder management, bowel management, safety, skin/wound care, disease management, medication administration, pain management, and patient education, does the patient require 24 hr/day rehab nursing? Yes Does the patient require coordinated care of a physician, rehab nurse, PT, OT, and SLP to address physical and functional deficits in the context of the above medical diagnosis(es)? Yes Addressing deficits in the following areas: balance, endurance, locomotion, strength, transferring, bowel/bladder control, bathing, dressing, feeding, grooming, toileting, and cognition Can the patient actively participate in an intensive therapy program of at least 3  hrs of therapy 5 days a week? Yes The potential for patient to make measurable gains while on inpatient rehab is good Anticipated functional outcomes upon discharge from inpatient rehab: modified independent and supervision PT, modified independent and supervision OT, modified independent and supervision SLP Estimated rehab length of stay to reach the above functional goals is: 7-10 days Anticipated discharge destination: Home 10. Overall Rehab/Functional Prognosis: good     MD Signature:           Revision History                                                    Note Details  Author Lovorn, Jinny Blossom,  MD File Time 07/16/2022 11:28 AM  Author Type Physician Status Signed  Last Editor Ryan Heys, MD Service Physical Medicine and [place] # [id number] Admit Date 07/16/2022

## 2022-07-18 NOTE — Progress Notes (Addendum)
PROGRESS NOTE   Subjective/Complaints: Pt reports he is doing "20x better" today. He has pain but its controlled with current medications. He is happy with the food here. He feels that he is getting stronger. No additional concerns.    Review of Systems  Constitutional:  Negative for chills and fever.  Eyes:  Negative for double vision.  Respiratory:  Negative for cough and shortness of breath.   Cardiovascular:  Negative for chest pain.  Gastrointestinal:  Negative for abdominal pain, constipation, nausea and vomiting.  Genitourinary:  Negative for dysuria.  Skin:  Positive for rash.  Neurological:  Positive for sensory change and focal weakness.      Objective:   No results found. Recent Labs    07/16/22 0137 07/18/22 0516  WBC 8.7 4.2  HGB 8.3* 8.6*  HCT 27.0* 28.0*  PLT 170 155   Recent Labs    07/16/22 0137 07/18/22 0516  NA 136 136  K 4.0 3.8  CL 105 107  CO2 25 22  GLUCOSE 126* 110*  BUN 7 8  CREATININE 0.68 0.87  CALCIUM 8.5* 8.5*    Intake/Output Summary (Last 24 hours) at 07/18/2022 0811 Last data filed at 07/18/2022 0630 Gross per 24 hour  Intake 1252 ml  Output 1020 ml  Net 232 ml        Physical Exam: Vital Signs Blood pressure 102/66, pulse 97, temperature 98.8 F (37.1 C), temperature source Oral, resp. rate 16, SpO2 94 %. Physical Exam   General: Alert and oriented x 3, No apparent distress HEENT: Head is normocephalic, atraumatic, PERRLA, EOMI, sclera anicteric, oral mucosa pink and moist Neck: Supple without JVD or lymphadenopathy Heart: Reg rate and rhythm. No murmurs rubs or gallops Chest: CTA bilaterally without wheezes, rales, or rhonchi; no distress Abdomen: Soft, non-tender, non-distended, bowel sounds positive. Extremities: No clubbing, cyanosis, or edema. Pulses are 2+ Psych: Pt's affect is appropriate. Pt is cooperative Musculoskeletal:     Cervical back: Neck  supple. No tenderness.     Comments: UE strength 5/5 RLE 5/5 in HF, KE, KF DF and PF LLE- HF 4/5; KE- due to pain, but is at least 3/5; DF/PF at least 4/5- not foot drop   Skin:     General: Skin is warm and dry.     Comments: Surgical incision around L knee and smaller incisions lower leg with sutures in place, no drainage noted, localized erythema around sutures-nursing reports more than yesterday Dressing in place left lower extremity medial and lateral, overall CDI with only 1  small spot of drainage noted on dressing  Psoriasis plaques- L thigh, R shin near ankle; multiple smaller spots around incisions on LLE and abdomen and back   Neuro:      Comments: Patient is alert.  Follows full commands.    Ox3 Decreased sensation to light touch in LLE below knee- but not absent- otherwise LT intact in other 3 limbs        Assessment/Plan: 1. Functional deficits which require 3+ hours per day of interdisciplinary therapy in a comprehensive inpatient rehab setting. Physiatrist is providing close team supervision and 24 hour management of active medical problems listed below. Physiatrist  and rehab team continue to assess barriers to discharge/monitor patient progress toward functional and medical goals  Care Tool:  Bathing    Body parts bathed by patient: Right arm, Left arm, Chest, Abdomen, Front perineal area, Buttocks, Right upper leg, Left upper leg, Left lower leg, Right lower leg, Face         Bathing assist Assist Level: Minimal Assistance - Patient > 75% (for balance)     Upper Body Dressing/Undressing Upper body dressing   What is the patient wearing?: Pull over shirt    Upper body assist Assist Level: Supervision/Verbal cueing    Lower Body Dressing/Undressing Lower body dressing      What is the patient wearing?: Pants (knee brace)     Lower body assist Assist for lower body dressing: Moderate Assistance - Patient 50 - 74%     Toileting Toileting     Toileting assist Assist for toileting: Minimal Assistance - Patient > 75%     Transfers Chair/bed transfer  Transfers assist     Chair/bed transfer assist level: Minimal Assistance - Patient > 75%     Locomotion Ambulation   Ambulation assist      Assist level: Moderate Assistance - Patient 50 - 74% Assistive device: Other (comment) (rail and HHA) Max distance: 4 feet   Walk 10 feet activity   Assist  Walk 10 feet activity did not occur: Safety/medical concerns (unable to perform due to pain, weakness, balance/coordination deficits and decreased activity tolerance)        Walk 50 feet activity   Assist Walk 50 feet with 2 turns activity did not occur: Safety/medical concerns (unable to perform due to pain, weakness, balance/coordination deficits and decreased activity tolerance)         Walk 150 feet activity   Assist Walk 150 feet activity did not occur: Safety/medical concerns (unable to perform due to pain, weakness, balance/coordination deficits and decreased activity tolerance)         Walk 10 feet on uneven surface  activity   Assist     Assist level: Minimal Assistance - Patient > 75% Assistive device: Walker-rolling   Wheelchair     Assist Is the patient using a wheelchair?: Yes Type of Wheelchair: Manual    Wheelchair assist level: Total Assistance - Patient < 25% Max wheelchair distance: 200 feet    Wheelchair 50 feet with 2 turns activity    Assist        Assist Level: Total Assistance - Patient < 25%   Wheelchair 150 feet activity     Assist      Assist Level: Total Assistance - Patient < 25%   Blood pressure 102/66, pulse 97, temperature 98.8 F (37.1 C), temperature source Oral, resp. rate 16, SpO2 94 %.  Medical Problem List and Plan: 1. Functional deficits secondary to left tibial plateau fracture with compartment syndrome complicated by traumatic rhabdomyolysis.  Status post closed reduction as well  as aspiration left knee and 4 compartment fasciotomy 07/01/2022.  Nonweightbearing with hinged knee brace when out of bed unlocked.             -patient may  shower if cover incisions- so cannot get wet             -ELOS/Goals: 7-10 days mod I to supervision  -Continue CIR  - [facility] today- asked to upload picture to chart  -Will start doxycycline 2.  Antithrombotics: -DVT/anticoagulation:  Pharmaceutical: Lovenox check vascular study             -  antiplatelet therapy: N/A 3. Pain Management: Oxycodone as needed- change from dilaudid 4. Mood/Behavior/Sleep as well as history of anxiety: Klonopin 2 mg twice daily             -antipsychotic agents: N/A 5. Neuropsych/cognition: This patient is capable of making decisions on his own behalf. 6. Skin/Wound Care: Routine skin checks 7. Fluids/Electrolytes/Nutrition: Routine in and out with follow up chemestries 8.  Acute blood loss anemia.  Follow-up CBC  -HGB stable at 8.6 9.  Ileus.  Weaned from [place].  Advance diet as tolerated. 10.  Traumatic rhabdomyolysis.  CK1 513 improved to 405 11.  History of alcohol use.  Alcohol level negative on admission.  Monitor for withdrawal. No signs or symptoms of withdrawal at this time noted 12.  Urinary retention.  Flomax.  Discontinue Foley tube check PVR.- will get out Sunday Am and cath if required- pt rather cath- add lidocaine jelly prn for caths- - Continent, bladder scan 34ml, he is on flomax 0.4mg  13.  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  Follow-up outpatient CT/MRI 14. Psoriasis- will add betamethasone cream BID to plaques- esp since they interfere with his incisions on LLE.  15. Will remove PICC_ not using. .  16. Mildly elevated total bilirubin of 1.3, continue to monitor   LOS: 2 days A FACE TO FACE EVALUATION WAS PERFORMED  Fanny Dance 07/18/2022, 8:11 AM

## 2022-07-18 NOTE — Progress Notes (Signed)
Occupational Therapy Session Note  Patient Details  Name: Ryan Lynch MRN: [id number] Date of Birth: 1981-05-09  Today's Date: 07/18/2022 OT Individual Time: 3532-9924 OT Individual Time Calculation (min): 75 min    Short Term Goals: Week 1:  OT Short Term Goal 1 (Week 1): Pt will be Supervision for LB bathing/dressing OT Short Term Goal 2 (Week 1): Pt will be Supervision for toileting OT Short Term Goal 3 (Week 1): Pt will demonstrate safe setup for transfers with Supervision OT Short Term Goal 4 (Week 1): Pt will complete functional transfers with Supervision  Skilled Therapeutic Interventions/Progress Updates:    Upon OT arrival, pt seated EOB reporting pain at 8.5/10 in L LE. Pt agreeable to OT treatment session. Pt continues to appear with decreased cognition and confusion.Treatment intervention with a focus on self care retraining, endurance, strengthening, standing tolerance/balance. Pt donns pants, brace, and shoes at the levels below seated EOB. Pt completes sit to stand transfer with RW and Min A. Pt ambulates with Min A using RW to w/c and was positioned infront of sink to complete grooming tasks at the levels below. Pt propels himself to ortho gym via w/c and SBA to complete 10 minutes on the arm bike using B UE. Pt completes at a steady pace without rest breaks. Pt then performs B UE exercises using a 4lb dumbbell including shoulder flex/ext, shoulder abd/add, chest press, overhead press, elbow flex/ext, supination/pronation, wrist flex/ext. Pt completes 2x10 reps with verbal cues to complete slowly and controlled. Pt tolerates all exercises well. Pt propels himself to main gym via w/c and SBA. Pt completes 2 sit to stand transfers with CGA and stands with CGA to place graded clothespins onto horizontal dowels using the L UE. Pt able to tolerate standing ~4 minutes before requiring seated rest break. Pt stands to remove all clothespins from dowels and return them to the container with  CGA. Pt was transported back to his room via w/c and total A for urgency to complete toilet transfer with Min A and manage pants with Min A for balance and verbal cues for sequencing. Pt was left on toilet and NT made aware to assist him secondary to end of OT session. Pt limited by decreased balance, cognition, and safety awareness and continues to benefit from OT services to achieve highest level of independence.   Therapy Documentation Precautions:  Precautions Precautions: Fall Required Braces or Orthoses: Other Brace (knee brace unlocked) Knee Immobilizer - Left: Other (comment) Other Brace: hinged knee brace, unlocked on when OOB able to d/c supine per ortho notes 8/28 Restrictions Weight Bearing Restrictions: Yes LLE Weight Bearing: Non weight bearing Other Position/Activity Restrictions: no ROM restrictions  ADL: Grooming: Supervision/safety Where Assessed-Grooming: Wheelchair Lower Body Dressing: Moderate assistance Where Assessed-Lower Body Dressing: Edge of bed Toileting: Minimal assistance Where Assessed-Toileting: Teacher, adult education: Curator Method: Surveyor, minerals: Grab bars    Therapy/Group: Individual Therapy  Johntavious Francom 07/18/2022, 8:02 AM

## 2022-07-18 NOTE — Progress Notes (Signed)
Speech Language Pathology Daily Session Note  Patient Details  Name: Jobie Popp MRN: [id number] Date of Birth: 1981-08-12  Today's Date: 07/18/2022 SLP Individual Time: 2482-5003 SLP Individual Time Calculation (min): 60 min  Short Term Goals: Week 1: SLP Short Term Goal 1 (Week 1): STG=LTG due to ELOS  Skilled Therapeutic Interventions:Skilled ST services focused on cognitive skills. Pt requested a diet upgrade from full liquid. SLP communicated with nursing staff, who reported tolerating all liquid diet and ability to upgrade. SLP provided regular textured snack, pt continued to demonstrate no dysphagia and SLP upgraded to regular textures; nursing to monitor tolerance. Pt demonstrated problem solving and memory skills ordering lunch items via phone with supervision A verbal cues to recall information. SLP facilitated medication management, pt demonstrated mod I verbal problem solving of medication times per day and supervision A verbal cues for problem solving when filling out BID pill organizer. Pt was left in room with call bell within reach and bed alarm set. SLP recommends to continue skilled services.     Pain Pain Assessment Pain Scale: 0-10 Pain Score: 0-No pain Pain Type: Surgical pain Pain Location: Leg Pain Orientation: Left Pain Descriptors / Indicators: Aching Pain Frequency: Intermittent Pain Intervention(s): Medication (See eMAR)  Therapy/Group: Individual Therapy  Shizuye Rupert  [facility] 07/18/2022, 1:22 PM

## 2022-07-19 DIAGNOSIS — K9189 Other postprocedural complications and disorders of digestive system: Secondary | ICD-10-CM

## 2022-07-19 DIAGNOSIS — T79A22D Traumatic compartment syndrome of left lower extremity, subsequent encounter: Secondary | ICD-10-CM | POA: Diagnosis not present

## 2022-07-19 DIAGNOSIS — S82145D Nondisplaced bicondylar fracture of left tibia, subsequent encounter for closed fracture with routine healing: Secondary | ICD-10-CM | POA: Diagnosis not present

## 2022-07-19 DIAGNOSIS — R339 Retention of urine, unspecified: Secondary | ICD-10-CM | POA: Diagnosis not present

## 2022-07-19 DIAGNOSIS — K567 Ileus, unspecified: Secondary | ICD-10-CM

## 2022-07-19 NOTE — Progress Notes (Signed)
Occupational Therapy Session Note  Patient Details  Name: Ryan Lynch MRN: [id number] Date of Birth: [birth year]  Today's Date: 07/19/2022 OT Individual Time: 2703-5009 OT Individual Time Calculation (min): 39 min    Short Term Goals: Week 1:  OT Short Term Goal 1 (Week 1): Pt will be Supervision for LB bathing/dressing OT Short Term Goal 2 (Week 1): Pt will be Supervision for toileting OT Short Term Goal 3 (Week 1): Pt will demonstrate safe setup for transfers with Supervision OT Short Term Goal 4 (Week 1): Pt will complete functional transfers with Supervision  Skilled Therapeutic Interventions/Progress Updates:    Patient received seated in wheelchair following PT session.  Patient confused regarding his schedule today.  Patient initially having difficulty answering questions regarding his injury - stating on multiple occasions that he fell down 11 flights of stairs.  Patient with limited eye contact initially, and when asked stated - "I am looking at the reflections" (in the morror.)  Took time to establish rapport with patient who appears somewhat anxious - talking rapidly and frequently apologizing.   Patient agreeable to work on balance - worked on sit to stand and then reducing pressure on UE's for support.  Patient with tremor in RLE - feels his leg is exhausted from all the workouts today, especially going up and down stairs x 3.   Worked on transitioning from wheelchair to bed - cueing for safe hand placement to stand from wheelchair.  Once up on walker, able to safely hop to bed, remove brace, and position himself in supine.  Patient needed to use deep breathing to help LLE/ Left hip decrease spasm/cramping after movement transition.   Patient left in supine in bed with bed alarm engaged and personal items within reach.    Therapy Documentation Precautions:  Precautions Precautions: Fall Required Braces or Orthoses: Other Brace (knee brace unlocked) Knee Immobilizer - Left: Other  (comment) Other Brace: hinged knee brace, unlocked on when OOB able to d/c supine per ortho notes 8/28 Restrictions Weight Bearing Restrictions: Yes LLE Weight Bearing: Non weight bearing Other Position/Activity Restrictions: no ROM restrictions  Pain:  Patient reports increased pain from stretching and range of motion exercise - not rated. Worked on active relaxation to reduce LLE pain.      Therapy/Group: Individual Therapy  [name] 07/19/2022, 3:12 PM

## 2022-07-19 NOTE — Progress Notes (Signed)
Physical Therapy Session Note  Patient Details  Name: Ryan Lynch MRN: [id number] Date of Birth: January 14, 1981  Today's Date: 07/19/2022 PT Individual Time: 0930-1015 PT Individual Time Calculation (min): 45 min   Short Term Goals: Week 1:  PT Short Term Goal 1 (Week 1): STG=LTG due to ELOS  Skilled Therapeutic Interventions/Progress Updates:      Pt received supine in bed. Agreeable to PT tx and denies pain. Pt requesting to "not walk" this session as he's done plenty of that and doesn't have concerns with ability to ambulate. Discussed home setup as pt has 2-3 steps to enter home with 2 railings and can reach both. Played a video for him of shower chair method to navigate stairs - pt reports he did this when he tore his ACL several years ago.  Pt completing bed mobility mod I with HOB nearly flat. Provided him with disposable pants which he donned with seutpA - able to recall dressing technique with threading involved LLE first.   Squat<>pivot transfer with supervision from EOB to w/c, placed on his L side - pt compliant with NWB restrictions.  Propelled himself mod I in w/c >132ft to main rehab gym.   Pt requesting to trial the stairs without the shower, reporting confidence in ability to navigate by just hopping.   Sit<>Stand to stairs with CGA for safety. Pt somewhat impulsively hopped up and down 4 stairs with CGA and 2 hand rails. With seated rest, educated on safety awareness and pacing activity to reduce falls risk. Pt voiced understanding. Completed the 4 stairs again with CGA and 2 hand rails, this time with improved safety awareness and slowed speed. Completed additional time to ensure understanding of technique.  Propelled himself mod I, ~151ft, to ortho rehab gym.   Completed UE ergometer at L3 resistance, 5 minutes forwards + 5 minutes backwards direction with brief rest break b/w sets.   Squat<>pivot transfer with supervision to mat table. Sit>supine on mat table without  assist. Completed prolonged stretching of hamstring to promote terminal knee extension. Also completed heel slides with AAROM for end range to tolerance.   Squat<>pivot transfer with supervision back to w/c. Direct handoff of care to SLP at end of session with patient in w/c.    Therapy Documentation Precautions:  Precautions Precautions: Fall Required Braces or Orthoses: Other Brace (knee brace unlocked) Knee Immobilizer - Left: Other (comment) Other Brace: hinged knee brace, unlocked on when OOB able to d/c supine per ortho notes 8/28 Restrictions Weight Bearing Restrictions: Yes LLE Weight Bearing: Non weight bearing Other Position/Activity Restrictions: no ROM restrictions General:       Therapy/Group: Individual Therapy  [name] 07/19/2022, 9:54 AM

## 2022-07-19 NOTE — Progress Notes (Signed)
Orthopedic trauma service  S: Doing well Pain controlled  O: BP (!) 98/59 (BP Location: Right Arm)   Pulse 91   Temp 98.4 F (36.9 C) (Oral)   Resp 17   SpO2 99%    Gen: Sitting up in bed, NAD L LEx:  All wounds are healing nicely.  No significant erythema no signs of infection  Area of psoriasis over the lateral aspect of his left proximal lower leg looks a little irritated and scabbed over but no active infection  Fasciotomy sites look excellent.  Dressings were removed they are dry and no signs of drainage or infection  Compartments are soft  Patient has remarkably good control of his ankle and toes with active motion  DPN SPN and TN sensory functions grossly intact  Knee range of motion is about 10 to 45 degrees  No pitting edema  + DP pulse  A/P  41 year old male with complex left tibial plateau fracture complicated by acute compartment syndrome  Left tibial plateau fracture s/p ORIF and acute compartment syndrome s/p 4 compartment fasciotomies  Nonweightbearing left leg for another 6 weeks or so Continue with aggressive range of motion of left knee.  Continue to work on getting full extension back. Do not rest with pillows under the bend of the knee.  Use bone foam if needed or pillows under the ankle Patient is to have his legs all the way straight or maximally bent when in a chair Okay to shower and clean all wounds with soap and water.  Leave wounds open to the air  DC sutures in about a week  Mearl Latin, PA-C [phone] (C) 07/19/2022, 11:38 AM  [facility] [address] [place] [address] [phone] (F)        Patient ID: Ryan Lynch, male   DOB: 1981/01/07, 41 y.o.   MRN: [id number]

## 2022-07-19 NOTE — Progress Notes (Signed)
Inpatient Rehabilitation  Patient information reviewed and entered into eRehab system by Debanhi Blaker M. Zeena Starkel, M.A., CCC/SLP, PPS Coordinator.  Information including medical coding, functional ability and quality indicators will be reviewed and updated through discharge.    

## 2022-07-19 NOTE — Progress Notes (Addendum)
PROGRESS NOTE   Subjective/Complaints: Pt reports the pain and swelling in his LLE has improved since yesterday.  He says it "just feels better". No new concerns.    Review of Systems  Constitutional:  Negative for chills and fever.  Eyes:  Negative for double vision.  Respiratory:  Negative for cough and shortness of breath.   Cardiovascular:  Negative for chest pain and palpitations.  Gastrointestinal:  Negative for abdominal pain, constipation, nausea and vomiting.  Genitourinary:  Negative for dysuria.  Skin:  Positive for rash.  Neurological:  Positive for sensory change and focal weakness.      Objective:   No results found. Recent Labs    07/18/22 0516  WBC 4.2  HGB 8.6*  HCT 28.0*  PLT 155    Recent Labs    07/18/22 0516  NA 136  K 3.8  CL 107  CO2 22  GLUCOSE 110*  BUN 8  CREATININE 0.87  CALCIUM 8.5*     Intake/Output Summary (Last 24 hours) at 07/19/2022 1221 Last data filed at 07/19/2022 1111 Gross per 24 hour  Intake 454 ml  Output 825 ml  Net -371 ml         Physical Exam: Vital Signs Blood pressure (!) 98/59, pulse 91, temperature 98.4 F (36.9 C), temperature source Oral, resp. rate 17, SpO2 99 %. Physical Exam   General: Alert and oriented x 3, No apparent distress HEENT: Head is normocephalic, atraumatic, PERRLA, EOMI, sclera anicteric, oral mucosa pink and moist Neck: Supple without JVD or lymphadenopathy Heart: Reg rate and rhythm. No murmurs rubs or gallops Chest: CTA bilaterally without wheezes, rales, or rhonchi; no distress Abdomen: Soft, non-tender, non-distended, bowel sounds positive. Extremities: No clubbing, cyanosis, or edema. Pulses are 2+ Psych: Pt's affect is appropriate. Pt is cooperative Musculoskeletal:     Cervical back: Neck supple. No tenderness.     Comments: UE strength 5/5 RLE 5/5 in HF, KE, KF DF and PF LLE- HF 4/5; KE- due to pain, but is at  least 3/5; DF/PF at least 4/5- not foot drop   Skin:     General: Skin is warm and dry.     Comments: Surgical incision around L knee and smaller incisions lower leg with sutures in place, no drainage noted, prior localized erythema around sutures appears much improved from yesterday Dressing in place left lower extremity medial and lateral, overall CDI with only 1  small spot of drainage noted on dressing  Psoriasis plaques- L thigh, R shin near ankle; multiple smaller spots around incisions on LLE and abdomen and back   Neuro:      Comments: Patient is alert.  Follows full commands.    Ox3 Decreased sensation to light touch in LLE below knee- but not absent- otherwise LT intact in other 3 limbs        Assessment/Plan: 1. Functional deficits which require 3+ hours per day of interdisciplinary therapy in a comprehensive inpatient rehab setting. Physiatrist is providing close team supervision and 24 hour management of active medical problems listed below. Physiatrist and rehab team continue to assess barriers to discharge/monitor patient progress toward functional and medical goals  Care Tool:  Bathing    Body parts bathed by patient: Right arm, Left arm, Chest, Abdomen, Front perineal area, Buttocks, Right upper leg, Left upper leg, Left lower leg, Right lower leg, Face         Bathing assist Assist Level: Minimal Assistance - Patient > 75% (for balance)     Upper Body Dressing/Undressing Upper body dressing   What is the patient wearing?: Pull over shirt    Upper body assist Assist Level: Supervision/Verbal cueing    Lower Body Dressing/Undressing Lower body dressing      What is the patient wearing?: Pants (knee brace)     Lower body assist Assist for lower body dressing: Moderate Assistance - Patient 50 - 74%     Toileting Toileting    Toileting assist Assist for toileting: Minimal Assistance - Patient > 75%     Transfers Chair/bed transfer  Transfers  assist     Chair/bed transfer assist level: Supervision/Verbal cueing     Locomotion Ambulation   Ambulation assist      Assist level: Minimal Assistance - Patient > 75% Assistive device: Walker-rolling Max distance: 63ft   Walk 10 feet activity   Assist  Walk 10 feet activity did not occur: Safety/medical concerns (unable to perform due to pain, weakness, balance/coordination deficits and decreased activity tolerance)  Assist level: Minimal Assistance - Patient > 75% Assistive device: Walker-rolling   Walk 50 feet activity   Assist Walk 50 feet with 2 turns activity did not occur: Safety/medical concerns (unable to perform due to pain, weakness, balance/coordination deficits and decreased activity tolerance)         Walk 150 feet activity   Assist Walk 150 feet activity did not occur: Safety/medical concerns (unable to perform due to pain, weakness, balance/coordination deficits and decreased activity tolerance)         Walk 10 feet on uneven surface  activity   Assist     Assist level: Minimal Assistance - Patient > 75% Assistive device: Walker-rolling   Wheelchair     Assist Is the patient using a wheelchair?: Yes Type of Wheelchair: Manual    Wheelchair assist level: Independent Max wheelchair distance: 152ft    Wheelchair 50 feet with 2 turns activity    Assist        Assist Level: Independent   Wheelchair 150 feet activity     Assist      Assist Level: Independent   Blood pressure (!) 98/59, pulse 91, temperature 98.4 F (36.9 C), temperature source Oral, resp. rate 17, SpO2 99 %.  Medical Problem List and Plan: 1. Functional deficits secondary to left tibial plateau fracture with compartment syndrome complicated by traumatic rhabdomyolysis.  Status post closed reduction as well as aspiration left knee and 4 compartment fasciotomy 07/01/2022.  Nonweightbearing with hinged knee brace when out of bed unlocked.              -patient may  shower if cover incisions- Per ortho ok to shower and clean wounds with soap and water             -ELOS/Goals: 7-10 days mod I to supervision  -Continue CIR  - [facility] 9/4- asked to upload picture to chart  -Doxycycline started 9/5 for suspected soft tissue infection with mild erythema around wound noted, appears much improved today, will plan to continue through 9/9  -Ortho recommending Non weightbearing LLE, ROM to L knee to work on getting full extension, Dc sutures in about  1 week 2.  Antithrombotics: -DVT/anticoagulation:  Pharmaceutical: Lovenox check vascular study             -antiplatelet therapy: N/A 3. Pain Management: Oxycodone as needed- change from dilaudid 4. Mood/Behavior/Sleep as well as history of anxiety: Klonopin 2 mg twice daily             -antipsychotic agents: N/A 5. Neuropsych/cognition: This patient is capable of making decisions on his own behalf. 6. Skin/Wound Care: Routine skin checks 7. Fluids/Electrolytes/Nutrition: Routine in and out with follow up chemestries 8.  Acute blood loss anemia.  Follow-up CBC  -HGB stable at 8.6 9.  Ileus.  Weaned from [place].  Advance diet as tolerated.  -9/5 tolerating regular diet, he reports daily BMs and says he had BM yesterday 10.  Traumatic rhabdomyolysis.  CK1 513 improved to 405 11.  History of alcohol use.  Alcohol level negative on admission.  Monitor for withdrawal. No signs or symptoms of withdrawal at this time noted 12.  Urinary retention.  Flomax.  Discontinue Foley tube check PVR.- will get out Sunday Am and cath if required- pt rather cath- add lidocaine jelly prn for caths- - 9/5 continent, continue flomax for now 13.  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  Follow-up outpatient CT/MRI 14. Psoriasis- will add betamethasone cream BID to plaques- esp since they interfere with his incisions on LLE.  15. Will  remove PICC_ not using. .  16. Mildly elevated total bilirubin of 1.3, continue to monitor  -recheck with routine labs monday   LOS: 3 days A FACE TO FACE EVALUATION WAS PERFORMED  Jennye Boroughs 07/19/2022, 12:21 PM

## 2022-07-19 NOTE — Progress Notes (Signed)
Patient ID: Ryan Lynch, male   DOB: 01-13-1981, 41 y.o.   MRN: [id number] Met with the patient to review situation, rehab process, team conference and plan of care. Discussed skin care/incision care; currently with sutures/dressing. Reviewed dietary modification recommendations for increased protein.  B+B managed. Continue to follow along to address educational needs to facilitate the preparation for discharge home (suite at parent's home). Margarito Liner, RN

## 2022-07-19 NOTE — IPOC Note (Signed)
Overall Plan of Care [facility]) Patient Details Name: Ryan Lynch MRN: [id number] DOB: [birth year]  Admitting Diagnosis: Closed nondisplaced fracture of left tibial plateau  Hospital Problems: Principal Problem:   Closed nondisplaced fracture of left tibial plateau Active Problems:   Traumatic compartment syndrome [facility])     Functional Problem List: Nursing Pain, Endurance, Bowel, Medication Management, Safety, Bladder  PT Balance, Pain, Behavior, Safety, Endurance, Sensory, Skin Integrity, Motor  OT Balance, Motor, Endurance, Safety, Cognition, Pain  SLP Cognition  TR         Basic ADL's: OT Grooming, Bathing, Dressing, Toileting     Advanced  ADL's: OT Simple Meal Preparation, Light Housekeeping     Transfers: PT Bed Mobility, Bed to Chair, Customer service manager, Tub/Shower     Locomotion: PT Ambulation, Wheelchair Mobility     Additional Impairments: OT Fuctional Use of Upper Extremity  SLP Social Cognition   Problem Solving, Memory, Attention, Awareness  TR      Anticipated Outcomes Item Anticipated Outcome  Self Feeding    Swallowing      Basic self-care  Mod I  Toileting  Mod I   Bathroom Transfers Mod I  Bowel/Bladder  manage bowel w mod I and bladder w toileting  Transfers  Supervision  Locomotion  Supervision  Communication     Cognition  Supervision  Pain  < 4 wtih prns  Safety/Judgment  manage w cues   Therapy Plan: PT Intensity: Minimum of 1-2 x/day ,45 to 90 minutes PT Frequency: 5 out of 7 days PT Duration Estimated Length of Stay: 7-10 days OT Intensity: Minimum of 1-2 x/day, 45 to 90 minutes OT Frequency: 5 out of 7 days OT Duration/Estimated Length of Stay: 7-10 days SLP Intensity: Minumum of 1-2 x/day, 30 to 90 minutes SLP Frequency: 3 to 5 out of 7 days SLP Duration/Estimated Length of Stay: 7-10 days   Team Interventions: Nursing Interventions Bladder Management, Disease Management/Prevention, Medication Management, Discharge  Planning, Pain Management, Bowel Management, Patient/Family Education, Skin Care/Wound Management  PT interventions Ambulation/gait training, Cognitive remediation/compensation, Discharge planning, DME/adaptive equipment instruction, Functional mobility training, Pain management, Psychosocial support, Therapeutic Activities, UE/LE Strength taining/ROM, Warden/ranger, Community reintegration, Disease management/prevention, Neuromuscular re-education, Equities trader education, Museum/gallery curator, Therapeutic Exercise, UE/LE Coordination activities, Wheelchair propulsion/positioning  OT Interventions Community reintegration, Warden/ranger, UE/LE Coordination activities, Therapeutic Exercise, Therapeutic Activities, UE/LE Strength taining/ROM, DME/adaptive equipment instruction, Discharge planning, Cognitive remediation/compensation, Functional mobility training, Psychosocial support, Pain management, Patient/family education, Self Care/advanced ADL retraining  SLP Interventions Cognitive remediation/compensation, Cueing hierarchy, Patient/family education, Internal/external aids  TR Interventions    SW/CM Interventions Discharge Planning, Psychosocial Support, Patient/Family Education   Barriers to Discharge MD  Medical stability, Home enviroment access/loayout, and Weight bearing restrictions  Nursing Decreased caregiver support, Home environment access/layout, Wound Care, Weight bearing restrictions, Other (comments) (Alcohol withdrawal) 2 level 3 ste; main B+B w parents  PT Inaccessible home environment, Home environment access/layout, Weight bearing restrictions house with 3 steps to enter with bilateral rails. 2 levels, able to live on main  OT Weight bearing restrictions    SLP      SW Insurance for SNF coverage, Decreased caregiver support, Weight bearing restrictions     Team Discharge Planning: Destination: PT-Home ,OT- Home , SLP-Home Projected Follow-up:  PT-Outpatient PT, OT-  24 hour supervision/assistance, Home health OT, SLP-Outpatient SLP Projected Equipment Needs: PT-To be determined, OT- To be determined, SLP-None recommended by SLP Equipment Details: PT-none, OT-  Patient/family involved in discharge planning: PT- Patient,  OT-Patient,  SLP-Patient, Family member/caregiver  MD ELOS: 7-10 days Medical Rehab Prognosis:  Excellent Assessment: The patient has been admitted for CIR therapies with the diagnosis of left tibial plateau fracture with compartment syndrome complicated by traumatic rhabdomyolysis. The team will be addressing functional mobility, strength, stamina, balance, safety, adaptive techniques and equipment, self-care, bowel and bladder mgt, patient and caregiver education. Goals have been set at mod I to sup. Anticipated discharge destination is home.       See Team Conference Notes for weekly updates to the plan of care

## 2022-07-19 NOTE — Progress Notes (Signed)
Speech Language Pathology Daily Session Note  Patient Details  Name: Armin Yerger MRN: [id number] Date of Birth: [birth year]  Today's Date: 07/19/2022 SLP Individual Time: 1015-1105 SLP Individual Time Calculation (min): 50 min and Today's Date: 07/19/2022 SLP Missed Time: 10 Minutes Missed Time Reason: Other (Comment) (delayed from previous tx session)  Short Term Goals: Week 1: SLP Short Term Goal 1 (Week 1): STG=LTG due to ELOS  Skilled Therapeutic Interventions: Skilled ST treatment focused on cognitive goals. SLP facilitated session by providing sup A verbal cues for recall of medication list, as discussed during previous session. SLP facilitated working memory, alternating attention, and verbal reasoning task with sup A verbal redirection cues for topic maintenance and recall of previously discussed items. Pt lost "train of thought" at times during conversational exchange and benefited from reminder of topic. Pt feels he is 80% back to his cognitive baseline, and as low as "60%" with fatigue. Pt recalled weight bearing and safety precautions with appropriate anticipatory awareness. Patient was left in bed with alarm activated and immediate needs within reach at end of session. Continue per current plan of care.      Pain Pain Assessment Pain Scale: 0-10 Pain Score: 6  Pain Type: Surgical pain Pain Location: Leg Pain Orientation: Left Pain Descriptors / Indicators: Aching Pain Onset: On-going Pain Intervention(s): Medication (See eMAR) Multiple Pain Sites: No  Therapy/Group: Individual Therapy  Tamala Ser 07/19/2022, 10:43 AM

## 2022-07-19 NOTE — Progress Notes (Signed)
Physical Therapy Session Note  Patient Details  Name: Ryan Lynch MRN: [id number] Date of Birth: 1981/07/21  Today's Date: 07/19/2022 PT Individual Time: 1300-1415 PT Individual Time Calculation (min): 75 min   Short Term Goals: Week 1:  PT Short Term Goal 1 (Week 1): STG=LTG due to ELOS  Skilled Therapeutic Interventions/Progress Updates:  Patient greeted sitting EOB in his room and agreeable to PT treatment session. Patient sitting EOB without his brace donned and notified per PA, patient is to don L hinged-knee brace when OOB performing mobility. While sitting EOB, therapist and patient donned knee brace together since patient reported not being able to recall proper way to don brace.   Patient performed stand pivot transfer from EOB to wheelchair with RW and CGA for safety- Patient was impulsive and demonstrated poor carryover/recall of previous trials of using the RW. VC for proper hand placement and sequencing with minimal improvements and attention noted.   Patient propelled manual wheelchair from his room to ortho rehab gym (~150') with B UE and supervision- VC for improved technique for improved propulsion.   Patient attempted to transfer from the wheelchair to the rehab mat without locking the brakes, removing the foot rests and properly positioning the wheelchair. Patient required redirection and education for proper transfer set-up with poor carryover noted. At end of treatment session, therapist had patient setup transfer to/from wheelchair and mat table x2 trials with moderate to max verbal cues required. Once properly set-up, patient was able to transfer via stand/squat pivot and supervision for safety. Patient with good adherence to NWB restrictions on L LE.   While sitting EOM, patient performed x10 LAQ with AROM and overpressure from therapist to improve overall L knee ROM.   While in supine, therapist performed various L LE stretches in order to improve knee ROM- Hamstring  and gastroc/soleus stretch 3 x 60 seconds L knee extension with overpressure >8 minutes L knee flexion with overpressure 6 x 60 seconds at end ROM L knee extended with 8# weight donned to thigh and L heel propped up x8+ minutes  Chest press with B UE and 10# bar, 3 x 30 (used as a way to distract the patient while working on L LE ROM)  Verbal cues throughout for pursed lip breathing in order to improve ROM and tolerance.   Patient was able to transition to/from sitting EOM and supine independently.   Patient propelled manual wheelchair with B UE back to his room with supervision and increased time required. Patient required minor verbal cues for properly locating his room- Once located, patient was still unsure if it was actually his room.   Patient returned to his room sitting upright in wheelchair and transitioned to OT care.   Therapy Documentation Precautions:  Precautions Precautions: Fall Required Braces or Orthoses: Other Brace (knee brace unlocked) Knee Immobilizer - Left: Other (comment) Other Brace: hinged knee brace, unlocked on when OOB able to d/c supine per ortho notes 8/28 Restrictions Weight Bearing Restrictions: Yes LLE Weight Bearing: Non weight bearing Other Position/Activity Restrictions: no ROM restrictions  Therapy/Group: Individual Therapy  Ermine Stebbins 07/19/2022, 8:00 AM

## 2022-07-20 DIAGNOSIS — F1010 Alcohol abuse, uncomplicated: Secondary | ICD-10-CM

## 2022-07-20 DIAGNOSIS — L409 Psoriasis, unspecified: Secondary | ICD-10-CM

## 2022-07-20 DIAGNOSIS — T79A22S Traumatic compartment syndrome of left lower extremity, sequela: Secondary | ICD-10-CM

## 2022-07-20 DIAGNOSIS — M79605 Pain in left leg: Secondary | ICD-10-CM

## 2022-07-20 DIAGNOSIS — T796XXA Traumatic ischemia of muscle, initial encounter: Secondary | ICD-10-CM

## 2022-07-20 DIAGNOSIS — T79A0XA Compartment syndrome, unspecified, initial encounter: Secondary | ICD-10-CM

## 2022-07-20 DIAGNOSIS — S82142A Displaced bicondylar fracture of left tibia, initial encounter for closed fracture: Secondary | ICD-10-CM

## 2022-07-20 MED ORDER — RIVAROXABAN 15 MG PO TABS: 15.0000 mg | ORAL_TABLET | Freq: Every day | ORAL | 0 refills | Status: DC

## 2022-07-20 MED ORDER — ORAL CARE MOUTH RINSE: 15.0000 mL | OROMUCOSAL | Status: DC | PRN

## 2022-07-20 NOTE — Progress Notes (Signed)
Physical Therapy Session Note  Patient Details  Name: Ryan Lynch MRN: [id number] Date of Birth: 10/13/1981  Today's Date: 07/20/2022 PT Individual Time: 0945-1045 PT Individual Time Calculation (min): 60 min   Short Term Goals: Week 1:  PT Short Term Goal 1 (Week 1): STG=LTG due to ELOS  Skilled Therapeutic Interventions/Progress Updates:  Patient greeted propelling manual wheelchair in hallway with OT- Patient transitioned from OT and agreeable to PT treatment session.   Patient propelled manual wheelchair to ortho gym with B UE and supervision. Once in the gym, patient positioned his wheelchair, locked the brakes and removed the foot rests prior to performing a squat pivot to the mat table with supervision for safety. Impoved safety awareness and recall from yesterday's treatment session.   Patient performed various sit/stands with RW and Supv- VC for proper hand placemnet with good improvements noted with increased repetition. At times patient is impulsive and anxious requiring cues for a deep breath prior to performing mobility.   Patient gait trained x50' with RW and Supv for safety- Patient demonstrated good cadence and safety awareness throughout. Minor VC for keeping the RW close to his body.   Patient performed x12 sit/stands with RW and supvervision- VC fpr proper hand placement with good improvements and recall noted with increased repetition.    Patient gait trained 2 x 25' and then ascended/descended a 10' low grade ramp with RW and supvervision for safety- VC for decreased cadence when descending the ramp with good improvements and awareness noted. As patient fatigued, he attempted to increase his gait speed leading to poor R foot clearance, VC for decreased cadence and taking a standing rest break when fatigued in order to ensure safety.    Patient propelled manual wheelchair to/from car simulator with supv- VC for improved alignment of wheelchair and need to lock brakes and  remove foot rests prior to attempting to stand. Patient impulsive and moving quickly- VC for taking a deep breath with improvements noted. Patient performed stand pivot transfer to/from wheelchair and car simulator with RW and supv for safety- VC for proper hand placement. Once seated in the car, patient was able to independently place B LE into/out of the car.   Patient ascended/descended x4 steps with B HR, x2 trials with CGA/SBA for safety- VC for improved R foot placement with good improvements noted during second trial.   Patient ascended/descended a 6" curb step, x2 trials with RW and Min/CGA for improved stability- VC for improved control when bringing the RW onto the step with good effort noted during second trial.   Patient completed UE bike x8 minutes on level 8 in order to improve overall endurance/activity tolerance and UE strength.    Patient returned to his room where he was able to properly setup his wheelchair with minor verbal cues and perform a squat pivot to EOB with supv for safety. Once seated, patient was able to doff L hinged-knee brace independently.   Patient left supine in bed with bed alarm on, call bell within reach and all needs met.     Therapy Documentation Precautions:  Precautions Precautions: Fall Required Braces or Orthoses: Other Brace (knee brace unlocked) Knee Immobilizer - Left: Other (comment) Other Brace: hinged knee brace, unlocked on when OOB able to d/c supine per ortho notes 8/28 Restrictions Weight Bearing Restrictions: Yes LLE Weight Bearing: Non weight bearing Other Position/Activity Restrictions: no ROM restrictions  Therapy/Group: Individual Therapy  Ryan Lynch 07/20/2022, 7:54 AM  

## 2022-07-20 NOTE — Progress Notes (Signed)
PROGRESS NOTE   Subjective/Complaints:  Pt states he slipped on a cat toy on steps , pain controlled   Review of Systems  Constitutional:  Negative for chills and fever.  Eyes:  Negative for double vision.  Respiratory:  Negative for cough and shortness of breath.   Cardiovascular:  Negative for chest pain and palpitations.  Gastrointestinal:  Negative for abdominal pain, constipation, nausea and vomiting.  Genitourinary:  Negative for dysuria.  Skin:  Positive for rash.  Neurological:  Positive for sensory change and focal weakness.      Objective:   No results found. Recent Labs    07/18/22 0516  WBC 4.2  HGB 8.6*  HCT 28.0*  PLT 155    Recent Labs    07/18/22 0516  NA 136  K 3.8  CL 107  CO2 22  GLUCOSE 110*  BUN 8  CREATININE 0.87  CALCIUM 8.5*     Intake/Output Summary (Last 24 hours) at 07/20/2022 0815 Last data filed at 07/20/2022 0700 Gross per 24 hour  Intake 590 ml  Output 1300 ml  Net -710 ml         Physical Exam: Vital Signs Blood pressure 108/68, pulse 90, temperature 97.8 F (36.6 C), temperature source Oral, resp. rate 17, SpO2 99 %. Physical Exam   General: No acute distress Mood and affect are appropriate Heart: Regular rate and rhythm no rubs murmurs or extra sounds Lungs: Clear to auscultation, breathing unlabored, no rales or wheezes Abdomen: Positive bowel sounds, soft nontender to palpation, nondistended Extremities: No clubbing, cyanosis, or edema Skin: No evidence of breakdown, no evidence of rash   Musculoskeletal:     Cervical back: Neck supple. No tenderness.     Comments: UE strength 5/5 RLE 5/5 in HF, KE, KF DF and PF LLE- HF 4/5; KE- due to pain, but is at least 3/5; DF/PF at least 4/5- not foot drop   Skin:     General: Skin is warm and dry.     Comments: Surgical incision around L knee and smaller incisions lower leg with sutures in place, no drainage  noted, prior localized erythema around sutures appears much improved from yesterday Dressing in place left lower extremity medial and lateral, overall CDI with only 1  small spot of drainage noted on dressing  Psoriasis plaques- L thigh, R shin near ankle; multiple smaller spots around incisions on LLE and abdomen and back    Neuro:      Comments: Patient is alert.  Follows full commands.    Ox3 Decreased sensation to light touch in LLE below knee- but not absent- otherwise LT intact in other 3 limbs   Assessment/Plan: 1. Functional deficits which require 3+ hours per day of interdisciplinary therapy in a comprehensive inpatient rehab setting. Physiatrist is providing close team supervision and 24 hour management of active medical problems listed below. Physiatrist and rehab team continue to assess barriers to discharge/monitor patient progress toward functional and medical goals  Care Tool:  Bathing    Body parts bathed by patient: Right arm, Left arm, Chest, Abdomen, Front perineal area, Buttocks, Right upper leg, Left upper leg, Left lower leg, Right lower leg,  Face         Bathing assist Assist Level: Minimal Assistance - Patient > 75% (for balance)     Upper Body Dressing/Undressing Upper body dressing   What is the patient wearing?: Pull over shirt    Upper body assist Assist Level: Supervision/Verbal cueing    Lower Body Dressing/Undressing Lower body dressing      What is the patient wearing?: Pants (knee brace)     Lower body assist Assist for lower body dressing: Moderate Assistance - Patient 50 - 74%     Toileting Toileting    Toileting assist Assist for toileting: Minimal Assistance - Patient > 75%     Transfers Chair/bed transfer  Transfers assist     Chair/bed transfer assist level: Supervision/Verbal cueing     Locomotion Ambulation   Ambulation assist      Assist level: Minimal Assistance - Patient > 75% Assistive device:  Walker-rolling Max distance: 46ft   Walk 10 feet activity   Assist  Walk 10 feet activity did not occur: Safety/medical concerns (unable to perform due to pain, weakness, balance/coordination deficits and decreased activity tolerance)  Assist level: Minimal Assistance - Patient > 75% Assistive device: Walker-rolling   Walk 50 feet activity   Assist Walk 50 feet with 2 turns activity did not occur: Safety/medical concerns (unable to perform due to pain, weakness, balance/coordination deficits and decreased activity tolerance)         Walk 150 feet activity   Assist Walk 150 feet activity did not occur: Safety/medical concerns (unable to perform due to pain, weakness, balance/coordination deficits and decreased activity tolerance)         Walk 10 feet on uneven surface  activity   Assist     Assist level: Minimal Assistance - Patient > 75% Assistive device: Walker-rolling   Wheelchair     Assist Is the patient using a wheelchair?: Yes Type of Wheelchair: Manual    Wheelchair assist level: Independent Max wheelchair distance: 159ft    Wheelchair 50 feet with 2 turns activity    Assist        Assist Level: Independent   Wheelchair 150 feet activity     Assist      Assist Level: Independent   Blood pressure 108/68, pulse 90, temperature 97.8 F (36.6 C), temperature source Oral, resp. rate 17, SpO2 99 %.  Medical Problem List and Plan: 1. Functional deficits secondary to left tibial plateau fracture with compartment syndrome complicated by traumatic rhabdomyolysis.  Status post closed reduction as well as aspiration left knee and 4 compartment fasciotomy 07/01/2022.  Nonweightbearing with hinged knee brace when out of bed unlocked.             -patient may  shower if cover incisions- Per ortho ok to shower and clean wounds with soap and water             -ELOS/Goals: 7-10 days mod I to supervision  -Continue CIR  - [facility] today 9/4- asked to upload picture to chart  -Doxycycline started 9/5 for suspected soft tissue infection with mild erythema around wound noted, appears much improved today, will plan to continue through 9/9  -Ortho recommending Non weightbearing LLE, ROM to L knee to work on getting full extension, Dc sutures in about 1 week 2.  Antithrombotics: -DVT/anticoagulation:  Pharmaceutical: Lovenox check vascular study             -antiplatelet therapy: N/A 3. Pain Management: Oxycodone as  needed- change from dilaudid 4. Mood/Behavior/Sleep as well as history of anxiety: Klonopin 2 mg twice daily             -antipsychotic agents: N/A 5. Neuropsych/cognition: This patient is capable of making decisions on his own behalf. 6. Skin/Wound Care: Routine skin checks 7. Fluids/Electrolytes/Nutrition: Routine in and out with follow up chemestries 8.  Acute blood loss anemia.  Follow-up CBC  -HGB stable at 8.6 9.  Ileus.  Weaned from [place].  Advance diet as tolerated.  -9/5 tolerating regular diet, he reports daily BMs and says he had BM yesterday 10.  Traumatic rhabdomyolysis.  CK1 513 improved to 405 11.  History of alcohol use.  Alcohol level negative on admission.  Monitor for withdrawal. No signs or symptoms of withdrawal at this time noted, also prescribed klonopin as OP 12.  Urinary retention.  Flomax.  Discontinue Foley tube check PVR.- will get out Sunday Am and cath if required- pt rather cath- add lidocaine jelly prn for caths- - 9/5 continent, continue flomax for now 13.  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  Follow-up outpatient CT/MRI 14. Psoriasis- will add triamcinolone  cream BID to plaques- esp since they interfere with his incisions on LLE.  15. Will remove PICC_ not using. .  16. Mildly elevated total bilirubin of 1.3, continue to monitor  -recheck with routine labs monday   LOS: 4 days A FACE TO FACE EVALUATION  WAS PERFORMED  Erick Colace 07/20/2022, 8:15 AM

## 2022-07-20 NOTE — Patient Care Conference (Signed)
Inpatient RehabilitationTeam Conference and Plan of Care Update Date: 07/20/2022   Time: 12:01 PM    Patient Name: Ryan Lynch      Medical Record Number: [id number]  Date of Birth: December 03, 1980 Sex: Male         Room/Bed: 4M06C/4M06C-01 Payor Info: Payor: [employer]UE [employer] / Plan: BCBS COMM PPO / Product Type: *No Product type* /    Admit Date/Time:  07/16/2022  5:19 PM  Primary Diagnosis:  Closed nondisplaced fracture of left tibial plateau  Hospital Problems: Principal Problem:   Closed nondisplaced fracture of left tibial plateau Active Problems:   Traumatic compartment syndrome [facility])    Expected Discharge Date: Expected Discharge Date: 07/23/22  Team Members Present: Physician leading conference: Dr. Sula Soda Social Worker Present: Dossie Der, LCSW Nurse Present: Chana Bode, RN PT Present: Other (comment) Sherron Ales Rafoth, PT) OT Present: Jake Shark, OT SLP Present: Feliberto Gottron, SLP PPS Coordinator present : Fae Pippin, SLP     Current Status/Progress Goal Weekly Team Focus  Bowel/Bladder   Pt continent of b/b.  remain continent of b/b  Assist with toileting qshift and prn   Swallow/Nutrition/ Hydration             ADL's   (S) transfers and ADLs overall. Limited by anxiety an impulsive at times  mod I  dynamic balance, ADL retraining, transfers, strengthening   Mobility   SBA/CGA for all transfers- Limited by confusion with poor recall/carryover  ModI  Dyanmic stability, transfers, gait, endurance, L LE ROM   Communication             Safety/Cognition/ Behavioral Observations  sup-to-min a  sup A  problem solving, memory with use of compensations, attention   Pain   Pt c/o LLE pain from surgical site. Pain level 8/10. Prn percocet given  report of pain level <5/10  Assess pain qshift   Skin   Pt has psoriasis on all extremities,chest, and back. surgical incision anterior on LLE thats OTA. Surgical incisions on the left and right sides also  on the LLE covered with surgical dressings.  Follow orders for psoriasis and incisions and keep skin intact and free from skin breakdown  Assess skin qshift     Discharge Planning:  Home with parents Mom can assist Dad is still recovering from recent back surgery. Pt will need supervision due to cognitive issues   Team Discussion: Patient with abnormal labs; follow up on adrenal mass OP. Pain controlled with current regimen. Sutures to incision remain x another week per MD. Patient limited by impulsiveness, anxiety, poor safety awareness, memory issues poor attention, and he is verbose and tangential.  Patient on target to meet rehab goals: yes, currently needs SBA_ supervision overall and supervision for cognition. Goals for discharge set for Mod I overall.  *See Care Plan and progress notes for long and short-term goals.   Revisions to Treatment Plan:  N/a   Teaching Needs: Safety, skin care, medications, dietary modifications, transfers, WB precautions, etc  Current Barriers to Discharge: Decreased caregiver support  Possible Resolutions to Barriers: Family education OP follow up services after weight bearing restrictions lifted DME: W/C, TTB, RW     Medical Summary Current Status: left tibial plateau fracture healing well, TTWB, moderate anemia, no dizziness reported  Barriers to Discharge: Weight bearing restrictions;Wound care   Possible Resolutions to Barriers/Weekly Focus: cont post op pain management and wound care, monitor Hgb   Continued Need for Acute Rehabilitation Level of Care: The patient  requires daily medical management by a physician with specialized training in physical medicine and rehabilitation for the following reasons: Direction of a multidisciplinary physical rehabilitation program to maximize functional independence : Yes Medical management of patient stability for increased activity during participation in an intensive rehabilitation regime.:  Yes Analysis of laboratory values and/or radiology reports with any subsequent need for medication adjustment and/or medical intervention. : Yes   I attest that I was present, lead the team conference, and concur with the assessment and plan of the team.   Chana Bode B 07/20/2022, 2:17 PM

## 2022-07-20 NOTE — Progress Notes (Signed)
Speech Language Pathology Daily Session Note  Patient Details  Name: Ryan Lynch MRN: [id number] Date of Birth: 03-19-1981  Today's Date: 07/20/2022 SLP Individual Time: 0730-0800 SLP Individual Time Calculation (min): 30 min  Short Term Goals: Week 1: SLP Short Term Goal 1 (Week 1): STG=LTG due to ELOS  Skilled Therapeutic Interventions: S: Pt seen this date for skilled ST intervention targeting cognitive-linguistic goals outlined above. Pt received awake/alert and lying in bed. Agreeable to ST intervention at bedside.  O: SLP facilitated today's session by providing Sup A question cues for pt to provide purpose/rationale for currently prescribed medications. Accurately placed 4 out of 4 medications in BID pill organizer with Sup A verbal cues for verbal problem-solving and working memory.   A: Pt appears sitmulable for skilled ST intervention and will continue to benefit from cognitive remediation and completion of functional IADL tasks to prepare for discharge home. Will plan to target sequencing and budgeting/money management in upcoming sessions.  P: Pt left in bed with all safety measures activated. Call bell reviewed and within reach and all immediate needs met. Continue per current ST POC next session.  Pain 8.5 out of 10 pain in L leg (under knee); RN notified and present at the end session to provide morning medications.  Therapy/Group: Individual Therapy  Ernst Cumpston A Amaris Garrette 07/20/2022, 11:17 AM

## 2022-07-20 NOTE — Discharge Summary (Signed)
Physician Discharge Summary  Patient ID: Ryan Lynch MRN: [id number] DOB/AGE: 04/07/1981 41 y.o.  Admit date: 07/16/2022 Discharge date: 07/23/2022  Discharge Diagnoses:  Principal Problem:   Closed nondisplaced fracture of left tibial plateau Active Problems:   Traumatic compartment syndrome [facility]) DVT prophylaxis Pain management Acute blood loss anemia Mood stabilization Ileus Traumatic rhabdomyolysis History of alcohol use Urinary retention Incidental finding of bilateral adrenal of 16 mm on left and 10 mm on the right T7 compression fracture   Discharged Condition: Stable  Significant Diagnostic Studies: DG Abd Portable 1V-Small Bowel Obstruction Protocol-initial, 8 hr delay  Result Date: 07/11/2022 CLINICAL DATA:  Small-bowel obstruction. EXAM: PORTABLE ABDOMEN - 1 VIEW COMPARISON:  Portable study yesterday at 7:45 p.m. FINDINGS: There are multiple dilated small bowel segments in the with lower abdomen, maximum caliber 4 cm, previously 3.7 cm. Contrast intermixed with bowel gas continues to be seen in the descending colon through into the rectum. No colonic dilatation is seen. There is no supine evidence of free air. No new calcification is seen with right upper quadrant gallbladder calcification again partially visible. NGT is in place with the tip abutting the wall of the body of stomach. IMPRESSION: Slight worsening in small bowel dilatation, was previously up to 3.7 cm now maximum 4 cm. Contrast in the normal caliber left-sided colon and rectum. Electronically Signed   By: Almira Bar M.D.   On: 07/11/2022 06:30   DG Abd Portable 1V-Small Bowel Protocol-Position Verification  Result Date: 07/10/2022 CLINICAL DATA:  Check NG placement EXAM: PORTABLE ABDOMEN - 1 VIEW COMPARISON:  None Available. FINDINGS: Scattered large and small bowel gas is noted. Findings of small-bowel obstruction are seen. Gastric catheter is noted with the tip in the stomach. Proximal side port lies at  the gastroesophageal junction and could be advanced further into the stomach. Calcification is again noted in the right upper quadrant related to the gallbladder. IMPRESSION: Gastric catheter as described. This could be advanced slightly further into the stomach. Changes consistent with small bowel obstruction. Electronically Signed   By: Alcide Clever M.D.   On: 07/10/2022 20:05   [place] EKG SITE RITE  Result Date: 07/10/2022 If [facility] image not attached, placement could not be confirmed due to current cardiac rhythm.  CT Angio Chest Pulmonary Embolism (PE) W or WO Contrast  Result Date: 07/10/2022 CLINICAL DATA:  Inpatient encounter for abdominal pain. Pulmonary embolus suspected with unknown D-dimer. EXAM: CT ANGIOGRAPHY CHEST CT ABDOMEN AND PELVIS WITH CONTRAST TECHNIQUE: Multidetector CT imaging of the chest was performed using the standard protocol during bolus administration of intravenous contrast. Multiplanar CT image reconstructions and MIPs were obtained to evaluate the vascular anatomy. Multidetector CT imaging of the abdomen and pelvis was performed using the standard protocol during bolus administration of intravenous contrast. RADIATION DOSE REDUCTION: This exam was performed according to the departmental dose-optimization program which includes automated exposure control, adjustment of the mA and/or kV according to patient size and/or use of iterative reconstruction technique. CONTRAST:  OMNIPAQUE IOHEXOL 350 MG/ML SOLN COMPARISON:  The chest, abdomen and pelvis CT with IV contrast 07/02/2022 and the abdomen and pelvis CT no contrast 07/04/2022. No older cross-sectional imaging for comparison. FINDINGS: CTA CHEST FINDINGS Cardiovascular: Pulmonary arteries are normal caliber but are suboptimally enhanced distal to the main lobar arteries due to bolus timing. Segmental and subsegmental arterial emboli are not excluded on this exam. There are no findings suspicious for acute right heart  strain. The heart is borderline prominent. There is  scattered three-vessel calcific CAD. There is no pericardial effusion. The pulmonary veins are decompressed. The thoracic aorta and great vessels are normal. Mediastinum/Nodes: The trachea is clear. The lower poles of the thyroid gland are unremarkable. Axillary spaces are clear. Mild superior mediastinal lipomatosis is again noted. There is no intrathoracic adenopathy. The esophagus is increasingly patulous. NGT is now seen in place with the tip abutting the far wall of the body of stomach. There is no esophageal thickening. Lungs/Pleura: Interval development noted a small to moderate right and small left bilateral layering pleural effusions. There is adjacent consolidation with air bronchograms in the lower lobes which could be simple compressive atelectasis or a combination of atelectasis and pneumonic consolidation. There is posterior atelectasis in the right upper lobe. The more anterior lungs are clear. Central airways are widely patent. Musculoskeletal: No chest wall abnormality. A mild upper plate anterior wedge compression fracture of T7 is again shown. No new abnormality of the thoracic cage is seen. There are chronic healed fracture deformities of some of the right anterior ribs but there are fracture deformities which may be more recent again noted involving the anterolateral sixth and seventh ribs and the anterolateral eighth and ninth ribs. Review of the MIP images confirms the above findings. CT ABDOMEN and PELVIS FINDINGS Hepatobiliary: 20 cm in length mildly steatotic liver without mass. There is a stone versus wall calcification in the mid gallbladder but no wall thickening or biliary dilatation. Spleen: Splenomegaly with splenic length 16.9 cm, unchanged. No splenic mass. Pancreas: No focal abnormality. Adrenals/Urinary Tract: Bilateral adrenal adenomas, both confirm measuring less than 10 Hounsfield units on the noncontrast CT, are again noted  measuring 1.8 cm on the right, 1.5 cm on the left. The bilateral renal cortex is unremarkable. There is no urinary stone or obstruction. The bladder is catheterized with small amount of air in the lumen. Stomach/Bowel: The stomach is more fluid distended than previously. Indwelling NGT abuts the anterior wall of the gastric body. Little if any change is seen in diffuse small bowel dilatation, maximum small bowel caliber is up to 4.4 cm, with high-grade obstruction felt to be present based on the difference in caliber between the dilated and the collapsed right lower quadrant segments. There is a transitional segment in the right lower quadrant at about the level of the iliac bifurcation posteriorly, best seen on 3: 59-62. Bowel gas and dense fluid continue to be seen in the mid to distal colon. There is increased stranding alongside the ascending and descending colon, unclear if this is due to colitis or dependent congestive changes/fluid overload. The ascending colon is relatively contracted. Vascular/Lymphatic: Trace aortic calcific plaque. No adenopathy is seen. Reproductive: No prostatomegaly. Other: There are mild mesenteric congestive features and development of mild ascites in the pelvis. There is minimal fluid in the pericolic gutters. There is no free air, free hemorrhage, bowel pneumatosis or abscess. Musculoskeletal: No acute or significant osseous findings. Review of the MIP images confirms the above findings. IMPRESSION: 1. No evidence of arterial dilatation or central emboli. The subsegmental and segmental arterial structures are largely not evaluated due to bolus timing. 2. Interval development of small to moderate right and small left pleural effusions with adjacent atelectasis or consolidation in the lower lobes. 3. Either the gallbladder is tightly contracted around a stone in the mid lumen or the patient has a partially porcelain gallbladder. There is no wall thickening or edema. 4. Persistent  high-grade distal small-bowel obstruction with an abrupt caliber change in  the right lower quadrant. 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 No internal hernia is visible. 5. Increased stranding along side the ascending and descending colon, could be due to a mild colitis or could be due to dependent congestive changes or fluid overload. There does appear to be increased congestive subcutaneous edema in the bilateral flanks. 6. Aortic and coronary atherosclerosis. 7. Mild hepatosplenomegaly and hepatic steatosis. 8. Mild ascites.  No free air. 9. Mild upper plate anterior wedge compression fracture of the T7 vertebral body again noted and additional redemonstrated potentially recent fractures in the anterolateral right sixth through ninth ribs. Electronically Signed   By: Telford Nab M.D.   On: 07/10/2022 02:05   CT ABDOMEN PELVIS W CONTRAST  Result Date: 07/10/2022 CLINICAL DATA:  Inpatient encounter for abdominal pain. Pulmonary embolus suspected with unknown D-dimer. EXAM: CT ANGIOGRAPHY CHEST CT ABDOMEN AND PELVIS WITH CONTRAST TECHNIQUE: Multidetector CT imaging of the chest was performed using the standard protocol during bolus administration of intravenous contrast. Multiplanar CT image reconstructions and MIPs were obtained to evaluate the vascular anatomy. Multidetector CT imaging of the abdomen and pelvis was performed using the standard protocol during bolus administration of intravenous contrast. RADIATION DOSE REDUCTION: This exam was performed according to the departmental dose-optimization program which includes automated exposure control, adjustment of the mA and/or kV according to patient size and/or use of iterative reconstruction technique. CONTRAST:  146mL OMNIPAQUE IOHEXOL 350 MG/ML SOLN COMPARISON:  The chest, abdomen and pelvis CT with IV contrast 07/02/2022 and the abdomen and pelvis CT no contrast 07/04/2022. No older cross-sectional imaging for comparison. FINDINGS: CTA CHEST  FINDINGS Cardiovascular: Pulmonary arteries are normal caliber but are suboptimally enhanced distal to the main lobar arteries due to bolus timing. Segmental and subsegmental arterial emboli are not excluded on this exam. There are no findings suspicious for acute right heart strain. The heart is borderline prominent. There is scattered three-vessel calcific CAD. There is no pericardial effusion. The pulmonary veins are decompressed. The thoracic aorta and great vessels are normal. Mediastinum/Nodes: The trachea is clear. The lower poles of the thyroid gland are unremarkable. Axillary spaces are clear. Mild superior mediastinal lipomatosis is again noted. There is no intrathoracic adenopathy. The esophagus is increasingly patulous. NGT is now seen in place with the tip abutting the far wall of the body of stomach. There is no esophageal thickening. Lungs/Pleura: Interval development noted a small to moderate right and small left bilateral layering pleural effusions. There is adjacent consolidation with air bronchograms in the lower lobes which could be simple compressive atelectasis or a combination of atelectasis and pneumonic consolidation. There is posterior atelectasis in the right upper lobe. The more anterior lungs are clear. Central airways are widely patent. Musculoskeletal: No chest wall abnormality. A mild upper plate anterior wedge compression fracture of T7 is again shown. No new abnormality of the thoracic cage is seen. There are chronic healed fracture deformities of some of the right anterior ribs but there are fracture deformities which may be more recent again noted involving the anterolateral sixth and seventh ribs and the anterolateral eighth and ninth ribs. Review of the MIP images confirms the above findings. CT ABDOMEN and PELVIS FINDINGS Hepatobiliary: 20 cm in length mildly steatotic liver without mass. There is a stone versus wall calcification in the mid gallbladder but no wall thickening  or biliary dilatation. Spleen: Splenomegaly with splenic length 16.9 cm, unchanged. No splenic mass. Pancreas: No focal abnormality. Adrenals/Urinary Tract: Bilateral adrenal adenomas, both confirm  measuring less than 10 Hounsfield units on the noncontrast CT, are again noted measuring 1.8 cm on the right, 1.5 cm on the left. The bilateral renal cortex is unremarkable. There is no urinary stone or obstruction. The bladder is catheterized with small amount of air in the lumen. Stomach/Bowel: The stomach is more fluid distended than previously. Indwelling NGT abuts the anterior wall of the gastric body. Little if any change is seen in diffuse small bowel dilatation, maximum small bowel caliber is up to 4.4 cm, with high-grade obstruction felt to be present based on the difference in caliber between the dilated and the collapsed right lower quadrant segments. There is a transitional segment in the right lower quadrant at about the level of the iliac bifurcation posteriorly, best seen on 3: 59-62. Bowel gas and dense fluid continue to be seen in the mid to distal colon. There is increased stranding alongside the ascending and descending colon, unclear if this is due to colitis or dependent congestive changes/fluid overload. The ascending colon is relatively contracted. Vascular/Lymphatic: Trace aortic calcific plaque. No adenopathy is seen. Reproductive: No prostatomegaly. Other: There are mild mesenteric congestive features and development of mild ascites in the pelvis. There is minimal fluid in the pericolic gutters. There is no free air, free hemorrhage, bowel pneumatosis or abscess. Musculoskeletal: No acute or significant osseous findings. Review of the MIP images confirms the above findings. IMPRESSION: 1. No evidence of arterial dilatation or central emboli. The subsegmental and segmental arterial structures are largely not evaluated due to bolus timing. 2. Interval development of small to moderate right and  small left pleural effusions with adjacent atelectasis or consolidation in the lower lobes. 3. Either the gallbladder is tightly contracted around a stone in the mid lumen or the patient has a partially porcelain gallbladder. There is no wall thickening or edema. 4. Persistent high-grade distal small-bowel obstruction with an abrupt caliber change in the right lower quadrant. 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 No internal hernia is visible. 5. Increased stranding along side the ascending and descending colon, could be due to a mild colitis or could be due to dependent congestive changes or fluid overload. There does appear to be increased congestive subcutaneous edema in the bilateral flanks. 6. Aortic and coronary atherosclerosis. 7. Mild hepatosplenomegaly and hepatic steatosis. 8. Mild ascites.  No free air. 9. Mild upper plate anterior wedge compression fracture of the T7 vertebral body again noted and additional redemonstrated potentially recent fractures in the anterolateral right sixth through ninth ribs. Electronically Signed   By: Telford Nab M.D.   On: 07/10/2022 02:05   DG Abd 1 View  Result Date: 07/09/2022 CLINICAL DATA:  Check gastric catheter placement EXAM: ABDOMEN - 1 VIEW COMPARISON:  Film from the previous day. FINDINGS: Gastric catheter is noted within the stomach. Scattered large and small bowel gas is noted. Persistent small bowel dilatation is seen. IMPRESSION: Gastric catheter within the stomach Electronically Signed   By: Inez Catalina M.D.   On: 07/09/2022 22:43   VAS [place] LOWER EXTREMITY VENOUS (DVT)  Result Date: 07/09/2022  Lower Venous DVT Study Patient Name:  HOLT LEARD  Date of Exam:   07/09/2022 Medical Rec #: [id number]    Accession #:    [id number] Date of Birth: Sep 24, 1981    Patient Gender: M Patient Age:   56 years Exam Location:  [facility] Procedure:      VAS [place] LOWER EXTREMITY VENOUS (DVT) Referring Phys: Ainsley Spinner  --------------------------------------------------------------------------------  Indications: Fever of unknown origin.  Risk Factors: Status post 4 compartment fasciotomy with closure 07/08/22. ORIF of left Tibial plateau and fasciotomy 07/05/22 Limitations: Bandages and open wound. Comparison Study: No prior study Performing Technologist: Sharion Dove RVS  Examination Guidelines: A complete evaluation includes B-mode imaging, spectral Doppler, color Doppler, and power Doppler as needed of all accessible portions of each vessel. Bilateral testing is considered an integral part of a complete examination. Limited examinations for reoccurring indications may be performed as noted. The reflux portion of the exam is performed with the patient in reverse Trendelenburg.  +---------+---------------+---------+-----------+----------+--------------+ RIGHT    CompressibilityPhasicitySpontaneityPropertiesThrombus Aging +---------+---------------+---------+-----------+----------+--------------+ CFV      Full           Yes      Yes                                 +---------+---------------+---------+-----------+----------+--------------+ SFJ      Full                                                        +---------+---------------+---------+-----------+----------+--------------+ FV Prox  Full                                                        +---------+---------------+---------+-----------+----------+--------------+ FV Mid   Full                                                        +---------+---------------+---------+-----------+----------+--------------+ FV DistalFull                                                        +---------+---------------+---------+-----------+----------+--------------+ PFV      Full                                                        +---------+---------------+---------+-----------+----------+--------------+ POP      Full           Yes       Yes                                 +---------+---------------+---------+-----------+----------+--------------+ PTV      Full                                                        +---------+---------------+---------+-----------+----------+--------------+ PERO     Full                                                        +---------+---------------+---------+-----------+----------+--------------+  Soleal   Full                                                        +---------+---------------+---------+-----------+----------+--------------+   +---------+---------------+---------+-----------+----------+-------------------+ LEFT     CompressibilityPhasicitySpontaneityPropertiesThrombus Aging      +---------+---------------+---------+-----------+----------+-------------------+ CFV      Full           Yes      Yes                                      +---------+---------------+---------+-----------+----------+-------------------+ SFJ      Full                                                             +---------+---------------+---------+-----------+----------+-------------------+ FV Prox  Full                                                             +---------+---------------+---------+-----------+----------+-------------------+ FV Mid   Full                                                             +---------+---------------+---------+-----------+----------+-------------------+ FV DistalFull                                                             +---------+---------------+---------+-----------+----------+-------------------+ PFV      Full                                                             +---------+---------------+---------+-----------+----------+-------------------+ POP      Full           Yes      Yes                                       +---------+---------------+---------+-----------+----------+-------------------+ PTV      Full                                                             +---------+---------------+---------+-----------+----------+-------------------+ PERO  Not well visualized +---------+---------------+---------+-----------+----------+-------------------+     Summary: RIGHT: - There is no evidence of deep vein thrombosis in the lower extremity.  LEFT: - There is no evidence of deep vein thrombosis in the lower extremity. However, portions of this examination were limited- see technologist comments above.  *See table(s) above for measurements and observations. Electronically signed by Harold Barban MD on 07/09/2022 at 4:14:40 PM.    Final    DG CHEST PORT 1 VIEW  Result Date: 07/09/2022 CLINICAL DATA:  Shortness of breath and fever EXAM: PORTABLE CHEST 1 VIEW COMPARISON:  07/07/2022 and prior studies FINDINGS: The cardiomediastinal silhouette is unchanged. An enteric tube is identified with tip overlying the proximal stomach and side hole overlying the distal esophagus-recommend advancement. Bilateral LOWER lung opacities are slightly increased. There is no evidence of pneumothorax or large pleural effusion. IMPRESSION: Slightly increased bilateral LOWER lung opacities which may represent atelectasis versus airspace disease/pneumonia. Enteric tube with tip overlying the proximal stomach and side hole overlying the distal esophagus-recommend advancement. Electronically Signed   By: Margarette Canada M.D.   On: 07/09/2022 12:37   DG Abd 1 View  Result Date: 07/08/2022 CLINICAL DATA:  Ileus EXAM: ABDOMEN - 1 VIEW COMPARISON:  07/07/2022 abdominal radiograph FINDINGS: Diffuse mild-to-moderate small bowel dilatation up to 4.4 cm diameter, similar to minimally improved. No evidence of pneumatosis or pneumoperitoneum. Minimal colorectal gas. IMPRESSION: Diffuse  mild-to-moderate small bowel dilatation, similar to minimally improved, compatible with reported adynamic ileus. Electronically Signed   By: Ilona Sorrel M.D.   On: 07/08/2022 07:59   DG Abd 1 View  Result Date: 07/07/2022 CLINICAL DATA:  Nasogastric tube placement. EXAM: ABDOMEN - 1 VIEW COMPARISON:  Same day. FINDINGS: Distal tip of nasogastric tube is seen in expected position of proximal stomach. Distal side hole is in expected position of distal esophagus. Stable small bowel dilatation is noted. IMPRESSION: Distal tip of nasogastric tube is seen in expected position of proximal stomach. Distal side hole is seen in expected position of distal esophagus. Electronically Signed   By: Marijo Conception M.D.   On: 07/07/2022 13:05   DG Abd 1 View  Result Date: 07/07/2022 CLINICAL DATA:  Abdominal distention EXAM: ABDOMEN - 1 VIEW COMPARISON:  Previous studies including the examination of 07/06/2022 FINDINGS: There is interval increase in small bowel dilation. Dilated small bowel loops measure up to 5.1 cm in diameter. Gas is present in colon. Stomach is not distended. There is interval removal of enteric tube. IMPRESSION: There is slight increase in amount of small bowel gas suggesting ileus or partial obstruction. Electronically Signed   By: Elmer Picker M.D.   On: 07/07/2022 08:08   DG CHEST PORT 1 VIEW  Result Date: 07/07/2022 CLINICAL DATA:  Difficulty breathing EXAM: PORTABLE CHEST 1 VIEW COMPARISON:  Previous studies including the examination of 07/04/2022 FINDINGS: There is interval removal of enteric tube. There are linear densities in the lower lung fields with no significant change. There are no signs of pulmonary edema. There is poor inspiration. No new infiltrates are seen. There is no pleural effusion or pneumothorax. IMPRESSION: Linear densities in the lower lung fields suggest subsegmental atelectasis. Electronically Signed   By: Elmer Picker M.D.   On: 07/07/2022 08:05   DG  Abd Portable 1V  Result Date: 07/06/2022 CLINICAL DATA:  NG tube placement. EXAM: PORTABLE ABDOMEN - 1 VIEW COMPARISON:  Radiograph earlier today FINDINGS: Tip and side port of the enteric tube below the diaphragm in the stomach. Slight  progression in gaseous distension of small bowel in the central abdomen. Calcification in the right upper quadrant related to the gallbladder is stable from prior CT. IMPRESSION: 1. Tip and side port of the enteric tube below the diaphragm in the stomach. 2. Slight progression in gaseous distension of small bowel in the central abdomen, progressive ileus versus obstruction. Electronically Signed   By: Keith Rake M.D.   On: 07/06/2022 17:55   DG Abd Portable 1V  Result Date: 07/06/2022 CLINICAL DATA:  NG tube EXAM: PORTABLE ABDOMEN - 1 VIEW COMPARISON:  07/05/2022 FINDINGS: NG tube remains position within the stomach. Numerous dilated loops of small bowel persist measuring up to 4.7 cm in diameter, previously 4.5 cm. Gaseous distension of the colon is also seen. Pattern remains more suggestive of ileus. IMPRESSION: NG tube remains within the stomach. Slight progression of small bowel distension. Overall pattern remains suggestive of ileus although partial or intermittent obstruction not excluded. Continued follow-up recommended. Electronically Signed   By: Davina Poke D.O.   On: 07/06/2022 13:50   DG Knee Complete 4 Views Left  Result Date: 07/05/2022 CLINICAL DATA:  [id number]; ORIF of left knee fracture EXAM: Images performed intraoperatively without presence of radiologist. Five intraoperative images of the left knee were obtained. Fluoro time: 44 seconds. Fluoro dose: 3.08 mGy COMPARISON:  July 02, 2022 FINDINGS: There are postsurgical changes seen with a plate and screw devices transfixing the comminuted approximately 2 cm wedge-shaped depressed fracture of the lateral tibial plateau. The alignment is near anatomical. Soft tissue swelling. IMPRESSION: ORIF  changes with plate and screw devices transfix the comminuted fracture of the lateral tibial plateau. The alignment is near anatomical. Electronically Signed   By: Frazier Richards M.D.   On: 07/05/2022 12:03   DG C-Arm 1-60 Min-No Report  Result Date: 07/05/2022 Fluoroscopy was utilized by the requesting physician.  No radiographic interpretation.   DG C-Arm 1-60 Min-No Report  Result Date: 07/05/2022 Fluoroscopy was utilized by the requesting physician.  No radiographic interpretation.   DG C-Arm 1-60 Min-No Report  Result Date: 07/05/2022 Fluoroscopy was utilized by the requesting physician.  No radiographic interpretation.   DG Abd Portable 1V  Result Date: 07/05/2022 CLINICAL DATA:  41 year old male status post advancement of nasogastric tube. EXAM: PORTABLE ABDOMEN - 1 VIEW COMPARISON:  Abdominal radiographs 07/05/2022. FINDINGS: Nasogastric tube has been advanced slightly, now with side port approximately 8 cm distal to the gastroesophageal junction. Mild gaseous distention of the stomach. Persistent gaseous distension throughout the small bowel, with small bowel loops measuring up to 4.5 cm in the central abdomen. A small amount of gas is also noted in the colon. IMPRESSION: 1. Support apparatus, as above. 2. Abnormal bowel-gas pattern, most suggestive of ileus based on comparison with prior CT 07/04/2022. Electronically Signed   By: Vinnie Langton M.D.   On: 07/05/2022 07:01   DG Abd Portable 1V  Result Date: 07/05/2022 CLINICAL DATA:  Nasogastric tube placement EXAM: PORTABLE ABDOMEN - 1 VIEW COMPARISON:  Abdominal CT from yesterday FINDINGS: Enteric tube with tip at the upper stomach and side-port over the lower esophagus. 7 cm advancement needed to place the side port at the stomach. Diffuse gaseous distension of bowel as seen on prior CT. No concerning mass effect or gas collection. Right upper quadrant calcification. IMPRESSION: 1. Enteric tube with tip at the stomach and side-port  over the lower esophagus. Need 7 cm of advancement to place the side port at the upper stomach. 2. Ileus pattern. Electronically  Signed   By: Tiburcio Pea M.D.   On: 07/05/2022 06:00   DG CHEST PORT 1 VIEW  Result Date: 07/05/2022 CLINICAL DATA:  [id number].  OG tube placement. EXAM: PORTABLE CHEST 1 VIEW COMPARISON:  Chest CT 07/02/2022 FINDINGS: 11:54 p.m. Interval enteric tube insertion. The tip is not included in the field but probably abuts the far wall of the body of the stomach based on the positioning of the proximal side hole. The lungs are expiratory. There is increased bilateral streaky atelectasis or infiltrate in the infrahilar areas. The remaining hypoexpanded lungs are generally clear. No pleural effusion is seen. The cardiomediastinal silhouette and central vasculature are stable. IMPRESSION: 1. Enteric tube is adequately intragastric, tip abutting the far wall of the body of stomach. 2. Expiratory exam with increased bilateral infrahilar opacities consistent with atelectasis or pneumonia. Electronically Signed   By: Almira Bar M.D.   On: 07/05/2022 00:09   CT ABDOMEN PELVIS WO CONTRAST  Result Date: 07/04/2022 CLINICAL DATA:  Bowel obstruction suspected. EXAM: CT ABDOMEN AND PELVIS WITHOUT CONTRAST TECHNIQUE: Multidetector CT imaging of the abdomen and pelvis was performed following the standard protocol without IV contrast. RADIATION DOSE REDUCTION: This exam was performed according to the departmental dose-optimization program which includes automated exposure control, adjustment of the mA and/or kV according to patient size and/or use of iterative reconstruction technique. COMPARISON:  Radiographs same date.  Abdominopelvic CT 07/02/2022 FINDINGS: Lower chest: Interval increased dependent atelectasis at both lung bases. No significant pleural or pericardial effusion. No basilar pneumothorax. Hepatobiliary: Hepatic steatosis without evidence of focal abnormality or acute injury on  noncontrast imaging. Unchanged atypical appearance of the gallbladder which appears decompressed with a possible stone in the fundal region. There are small calcifications more proximally within the gallbladder wall. No surrounding inflammatory changes or biliary dilatation. Pancreas: Unremarkable. No pancreatic ductal dilatation or surrounding inflammatory changes. Spleen: Measures 13.7 x 7.6 x 13.5 cm (volume = 740 cm^3), consistent with mild splenomegaly, unchanged. No focal abnormality or surrounding fluid collection on noncontrast imaging. Adrenals/Urinary Tract: Small low-density adrenal nodules bilaterally, measuring 1.3 cm and 9 HU on the right (image 27/3) and 1.5 cm and -2 HU on the left (image 31/3). These are consistent with benign adenomas, likely incidental. Tiny nonobstructing calculus in the lower pole of the right kidney. No evidence of ureteral calculus or hydronephrosis. The bladder appears unremarkable for its degree of distention. Stomach/Bowel: Enteric contrast was administered and has passed into the proximal small bowel. The stomach is not significantly distended. There is moderate diffuse small bowel and colonic dilatation without focal transition point, wall thickening or surrounding inflammation. The appendix appears normal. Vascular/Lymphatic: There are no enlarged abdominal or pelvic lymph nodes. No significant vascular findings on noncontrast imaging. No evidence of retroperitoneal hematoma. Reproductive: The prostate gland and seminal vesicles appear unremarkable. Other: Trace ascites in the right lower quadrant and left pelvis. No focal extraluminal fluid collection. No pneumoperitoneum. Musculoskeletal: No acute or significant osseous findings in the abdomen or pelvis. Mild T7 superior endplate compression deformity again noted. IMPRESSION: 1. Interval development of diffuse small and large bowel distension in a pattern most consistent with an ileus. Recommend radiographic follow-up.  Patient may benefit from nasogastric tube decompression of the stomach. 2. Small amount of ascites in the right lower quadrant and left pelvis of undetermined etiology. No evidence of contrast leak, free air or drainable fluid collection. 3. Stable atypical appearance of the gallbladder which may be secondary to gallstones and/or gallbladder wall calcification.  4. Previously demonstrated small adrenal nodules measure near water density, consistent with adenomas, likely incidental. Electronically Signed   By: Richardean Sale M.D.   On: 07/04/2022 19:34   DG Abd 1 View  Result Date: 07/04/2022 CLINICAL DATA:  Decreased bowel sounds EXAM: ABDOMEN - 1 VIEW COMPARISON:  07/02/2022 FINDINGS: Diffusely dilated air-filled loops of both large and small bowel throughout the abdomen. No gross free intraperitoneal air. No acute bony findings. IMPRESSION: Diffusely dilated air-filled loops of both large and small bowel throughout the abdomen. Findings are most suggestive of ileus. Obstruction not excluded. Continued follow-up recommended. Electronically Signed   By: Davina Poke D.O.   On: 07/04/2022 13:04   CT KNEE LEFT WO CONTRAST  Result Date: 07/02/2022 CLINICAL DATA:  Knee trauma, tibial plateau fracture. EXAM: CT OF THE LEFT KNEE WITHOUT CONTRAST TECHNIQUE: Multidetector CT imaging of the left knee was performed according to the standard protocol. Multiplanar CT image reconstructions were also generated. RADIATION DOSE REDUCTION: This exam was performed according to the departmental dose-optimization program which includes automated exposure control, adjustment of the mA and/or kV according to patient size and/or use of iterative reconstruction technique. COMPARISON:  None Available. FINDINGS: Bones/Joint/Cartilage Severely comminuted fracture of the lateral tibial plateau with approximately 2 cm wedge-shaped depression. The depressed osseous fragment measures at least 2.5 x 1.5 cm. There is also mildly  displaced medial malleolar fracture without significant depression. There is a transverse fracture through the proximal tibial metadiaphysis. Large suprapatellar joint effusion with lipohemarthrosis. Ligaments Evaluation of ligaments is limited on CT examination. Muscles and Tendons Edema about the origin of the medial head of the gastrocnemius and soleus, likely sequela of the trauma. No drainable fluid collection or hematoma. Quadriceps and patellar tendon appear intact. Soft tissues Mild soft tissue edema about the knee. IMPRESSION: 1. Comminuted depressed lateral tibial plateau fracture with approximately 2 cm wedge-shaped depression, nondisplaced fracture of the medial tibial plateau and transverse fracture of the tibial metaphysis, findings most consistent with Schatzker type 6 fracture. 2.  Large joint effusion with lipohemarthrosis. 3. Muscle edema and surrounding inflammatory changes of the lateral head of the gastrocnemius and soleus without evidence of intramuscular hematoma or fluid collection. Electronically Signed   By: Keane Police D.O.   On: 07/02/2022 21:24   DG Hand Complete Right  Result Date: 07/02/2022 CLINICAL DATA:  Bruising on posterior aspect of hand. EXAM: RIGHT HAND - COMPLETE 3+ VIEW COMPARISON:  None Available. FINDINGS: There is no evidence of fracture or dislocation. There is no evidence of arthropathy or other focal bone abnormality. Dorsal soft tissue edema overlies the metacarpals. IMPRESSION: Dorsal soft tissue edema. No fracture or dislocation. Electronically Signed   By: Keith Rake M.D.   On: 07/02/2022 19:35   DG Knee Left Port  Result Date: 07/02/2022 CLINICAL DATA:  Postop external fixator placement. EXAM: PORTABLE LEFT KNEE - 1-2 VIEW COMPARISON:  Preoperative radiographs. FINDINGS: External fixator placement with femoral pain only included on the AP view. The tibial pin is not included in the field of view. Comminuted proximal tibial fracture in slightly  improved alignment from preoperative exam. A wound VAC is partially visualized. IMPRESSION: External fixator placement, femoral pain included on the AP view, tibial pins not visualized on the current exam. Comminuted proximal tibial fracture in improved alignment from preoperative exam. Electronically Signed   By: Keith Rake M.D.   On: 07/02/2022 19:34   DG Tibia/Fibula Left  Result Date: 07/02/2022 CLINICAL DATA:  External fixation. EXAM: LEFT TIBIA  AND FIBULA - 2 VIEW COMPARISON:  Preoperative imaging. FINDINGS: Four fluoroscopic spot views of the left tibia and fibula obtained in the operating room. Two pins are seen in the tibial shaft. Improved alignment of comminuted proximal tibial fracture. Fluoroscopy time 15 seconds. Dose 1.04 mGy. IMPRESSION: Intraoperative fluoroscopy during left tibia and fibula external fixation placement. Electronically Signed   By: Keith Rake M.D.   On: 07/02/2022 16:30   DG C-Arm 1-60 Min-No Report  Result Date: 07/02/2022 Fluoroscopy was utilized by the requesting physician.  No radiographic interpretation.   DG Tibia/Fibula Left  Result Date: 07/02/2022 CLINICAL DATA:  41 year old male status post fall down stairs. Comminuted left tibial plateau fracture. EXAM: LEFT TIBIA AND FIBULA - 2 VIEW COMPARISON:  Left tib fib CT today reported separately. FINDINGS: Comminuted and impacted tibial plateau and proximal metadiaphysis fracture as demonstrated by CT. Associated knee joint effusion. Calf soft tissue swelling. Distal left femur, patella and proximal fibula appear intact. Left ankle is detailed separately today. IMPRESSION: Comminuted and impacted left tibial plateau and proximal metadiaphysis fracture as demonstrated by CT today. Associated knee joint effusion. Electronically Signed   By: Genevie Ann M.D.   On: 07/02/2022 10:57   DG Pelvis Portable  Result Date: 07/02/2022 CLINICAL DATA:  41 year old male status post fall down stairs. Comminuted left tibial  plateau fracture. EXAM: PORTABLE PELVIS 1-2 VIEWS COMPARISON:  CT Chest, Abdomen, and Pelvis today reported separately. FINDINGS: Portable AP view at 0949 hours. Femoral heads are normally located. Intact pelvis and proximal femurs. Symphysis and SI joints appear within normal limits. Negative visible bowel gas. IMPRESSION: No acute fracture or dislocation identified about the pelvis. Electronically Signed   By: Genevie Ann M.D.   On: 07/02/2022 10:56   DG Ankle Complete Left  Result Date: 07/02/2022 CLINICAL DATA:  41 year old male status post fall down stairs. Comminuted left tibial plateau fracture. EXAM: LEFT ANKLE COMPLETE - 3+ VIEW COMPARISON:  Left tib fib series and CT today. FINDINGS: Mortise joint alignment maintained. Talar dome intact. No joint effusion identified. Calcaneus appears intact. Distal tibia and fibula, talus and visible bones of the left foot appear intact. IMPRESSION: No acute fracture or dislocation identified about the left ankle. Electronically Signed   By: Genevie Ann M.D.   On: 07/02/2022 10:54   CT CERVICAL SPINE WO CONTRAST  Result Date: 07/02/2022 CLINICAL DATA:  41 year old male status post fall down stairs. Comminuted left tibial plateau fracture. EXAM: CT CERVICAL SPINE WITHOUT CONTRAST TECHNIQUE: Multidetector CT imaging of the cervical spine was performed without intravenous contrast. Multiplanar CT image reconstructions were also generated. RADIATION DOSE REDUCTION: This exam was performed according to the departmental dose-optimization program which includes automated exposure control, adjustment of the mA and/or kV according to patient size and/or use of iterative reconstruction technique. COMPARISON:  Head CT today. FINDINGS: Alignment: Straightening of cervical lordosis. Cervicothoracic junction alignment is within normal limits. Bilateral posterior element alignment is within normal limits. Skull base and vertebrae: Osteopenia for age. Visualized skull base is intact. No  atlanto-occipital dissociation. C1 and C2 appear intact and aligned. No acute osseous abnormality identified. Soft tissues and spinal canal: No prevertebral fluid or swelling. No visible canal hematoma. Negative visible noncontrast neck soft tissues. Disc levels:  Mild cervical disc bulging. Upper chest: Negative. IMPRESSION: No acute traumatic injury identified in the cervical spine. Osteopenia for age. Electronically Signed   By: Genevie Ann M.D.   On: 07/02/2022 10:53   CT CHEST ABDOMEN PELVIS W CONTRAST  Result Date: 07/02/2022 CLINICAL DATA:  Status post fall down the stairs. EXAM: CT CHEST, ABDOMEN, AND PELVIS WITH CONTRAST TECHNIQUE: Multidetector CT imaging of the chest, abdomen and pelvis was performed following the standard protocol during bolus administration of intravenous contrast. RADIATION DOSE REDUCTION: This exam was performed according to the departmental dose-optimization program which includes automated exposure control, adjustment of the mA and/or kV according to patient size and/or use of iterative reconstruction technique. CONTRAST:  129mL OMNIPAQUE IOHEXOL 300 MG/ML  SOLN COMPARISON:  None Available. FINDINGS: CT CHEST FINDINGS Cardiovascular: No significant vascular findings. Normal heart size. No pericardial effusion. Mediastinum/Nodes: No enlarged mediastinal, hilar, or axillary lymph nodes. Thyroid gland, trachea, and esophagus demonstrate no significant findings. Lungs/Pleura: Lungs are clear. No pleural effusion or pneumothorax. Musculoskeletal: T7 mild anterior vertebral body compression fracture with approximately 20% height loss. No aggressive osseous lesion. CT ABDOMEN PELVIS FINDINGS Hepatobiliary: No focal liver abnormality is seen. Diffuse low attenuation of the liver as can be seen with hepatic steatosis. Decompressed gallbladder with a gallstone in the fundus. No biliary ductal dilatation. Pancreas: Unremarkable. No pancreatic ductal dilatation or surrounding inflammatory  changes. Spleen: Mild splenomegaly without focal abnormality measuring 13 x 13.5 x 7.9 cm. Adrenals/Urinary Tract: 16 mm indeterminate left adrenal hypodense mass. Indeterminate 10 mm right adrenal gland mass. Kidneys are normal, without renal calculi, focal lesion, or hydronephrosis. Bladder is unremarkable. Stomach/Bowel: Stomach is within normal limits. No evidence of bowel wall thickening, distention, or inflammatory changes. Appendix is normal. Vascular/Lymphatic: No significant vascular findings are present. No enlarged abdominal or pelvic lymph nodes. Reproductive: Prostate is unremarkable. Other: No abdominal wall hernia or abnormality. No abdominopelvic ascites. Musculoskeletal: No acute osseous abnormality. No aggressive osseous lesion. IMPRESSION: 1. T7 mild anterior vertebral body compression fracture with approximately 20% height loss. 2. Otherwise, no acute traumatic injury to the chest, abdomen or pelvis. 3. Hepatic steatosis. 4. Cholelithiasis without evidence of cholecystitis. 5. Indeterminate bilateral adrenal gland nodules. Recommend further characterization with a non emergent CT or MRI of the adrenal glands. Electronically Signed   By: Kathreen Devoid M.D.   On: 07/02/2022 10:51   CT HEAD WO CONTRAST  Result Date: 07/02/2022 CLINICAL DATA:  41 year old male status post fall down stairs. Comminuted left tibial plateau fracture. EXAM: CT HEAD WITHOUT CONTRAST TECHNIQUE: Contiguous axial images were obtained from the base of the skull through the vertex without intravenous contrast. RADIATION DOSE REDUCTION: This exam was performed according to the departmental dose-optimization program which includes automated exposure control, adjustment of the mA and/or kV according to patient size and/or use of iterative reconstruction technique. COMPARISON:  None Available. FINDINGS: Brain: Suggestion of generalized cerebral volume loss for age. No midline shift, ventriculomegaly, mass effect, evidence of  mass lesion, intracranial hemorrhage or evidence of cortically based acute infarction. Gray-white matter differentiation is within normal limits throughout the brain. Vascular: Mild Calcified atherosclerosis at the skull base. No suspicious intracranial vascular hyperdensity. Skull: No skull fracture identified. Sinuses/Orbits: Visualized paranasal sinuses and mastoids are clear. Other: Broad-based right lateral convexity scalp hematoma. No scalp soft tissue gas. Visualized orbit soft tissues are within normal limits. IMPRESSION: 1. Broad-based right scalp hematoma without underlying skull fracture. 2. Negative noncontrast CT appearance of the brain. Electronically Signed   By: Genevie Ann M.D.   On: 07/02/2022 10:51   DG Chest Port 1 View  Result Date: 07/02/2022 CLINICAL DATA:  41 year old male status post fall down stairs. Comminuted left tibial plateau fracture. EXAM: PORTABLE CHEST 1 VIEW COMPARISON:  None  Available. FINDINGS: Portable AP supine view at 0945 hours. Normal lung volumes and mediastinal contours. Visualized tracheal air column is within normal limits. Allowing for portable technique the lungs are clear. No pneumothorax or pleural effusion identified on this supine view. No osseous abnormality identified. Negative visible bowel gas. IMPRESSION: Negative portable chest. Electronically Signed   By: Genevie Ann M.D.   On: 07/02/2022 10:49   CT Tibia Fibula Left Wo Contrast  Result Date: 07/02/2022 CLINICAL DATA:  Mechanical fall 6 hours ago. Deformity of the left leg. EXAM: CT OF THE LOWER LEFT EXTREMITY WITHOUT CONTRAST TECHNIQUE: Multidetector CT imaging of the lower left extremity was performed according to the standard protocol. RADIATION DOSE REDUCTION: This exam was performed according to the departmental dose-optimization program which includes automated exposure control, adjustment of the mA and/or kV according to patient size and/or use of iterative reconstruction technique. COMPARISON:  None  Available. FINDINGS: Bones/Joint/Cartilage Severely comminuted fracture of the lateral tibial plateau with 2 cm of maximum depression of the articular surface with the depressed area measuring approximately 3 x 3.6 cm. Fracture involves the medial and lateral tibial eminence. Nondisplaced intra-articular fracture of the posterior medial tibial plateau without significant depression. Transverse fracture through the proximal tibial metaphysis without significant displacement. No other fracture or dislocation.  Large hemarthrosis. Ligaments Ligaments are suboptimally evaluated by CT. Muscles and Tendons No significant muscle atrophy. Mild perifascial edema deep to the lateral gastrocnemius muscle with poor definition of the soleus muscle as can be seen with muscle edema secondary to direct trauma or myositis. Soft tissue No fluid collection or hematoma.  No soft tissue mass. IMPRESSION: 1. Severely comminuted fracture of the lateral tibial plateau with 2 cm of maximum depression of the articular surface with the depressed area measuring approximately 3 x 3.6 cm. Fracture involves the medial and lateral tibial eminence. 2. Nondisplaced intra-articular fracture of the posterior medial tibial plateau without significant depression. 3. Transverse fracture through the proximal tibial metaphysis without significant displacement. 4. Large hemarthrosis. 5. Mild perifascial edema deep to the lateral gastrocnemius muscle with poor definition of the soleus muscle as can be seen with muscle edema secondary to direct trauma or myositis. These results were called by telephone at the time of interpretation on 07/02/2022 at 10:44 am to provider Georgina Snell , who verbally acknowledged these results. Electronically Signed   By: Kathreen Devoid M.D.   On: 07/02/2022 10:45    Labs:  Basic Metabolic Panel: Recent Labs  Lab 07/16/22 0137 07/18/22 0516  NA 136 136  K 4.0 3.8  CL 105 107  CO2 25 22  GLUCOSE 126* 110*  BUN 7 8   CREATININE 0.68 0.87  CALCIUM 8.5* 8.5*    CBC: Recent Labs  Lab 07/16/22 0137 07/18/22 0516  WBC 8.7 4.2  NEUTROABS  --  2.4  HGB 8.3* 8.6*  HCT 27.0* 28.0*  MCV 91.8 92.7  PLT 170 155    CBG: Recent Labs  Lab 07/15/22 2319 07/16/22 0319 07/16/22 0751 07/16/22 1258  GLUCAP 129* 128* 123* 167*    Brief HPI:   Ryan Lynch is a 41 y.o. right-handed male with history of anxiety maintained on Klonopin as well as Zoloft as well as alcohol use.  Per chart review lives with parents.  Independent prior to admission.  Admitted 07/02/2022 after fall down approximately 8 steps.  Denied loss of consciousness.  Cranial CT scan showed broad-based right scalp hematoma without underlying skull fracture.  Negative contrast CT appearance of the  brain.  CT cervical spine negative.  CT of the chest abdomen pelvis showed a T7 mild anterior vertebral body compression fracture with approximately 20% height loss.  No acute traumatic injury to the chest abdomen or pelvis.  Incidental findings of bilateral adrenal 16 mm left 10 mm right plan outpatient CT MRI.  CT of left tibia-fibula showed severely comminuted fracture of the lateral tibial plateau with 2 cm maximum depression of the articular surface with the depressed area measuring approximately 3 x 3.6 cm.  Fractures involving the medial lateral tibial eminence.  Nondisplaced intra-articular fracture of the posterior medial tibial plateau without significant depression.  Transverse fracture of the proximal tibial metaphysis without significant displacement as well as large hemarthrosis.  Mild perifascial edema deep to the lateral gastrocnemius muscle with poor definition of the psoas muscle.  Admission chemistries unremarkable except ALT 59 AST 188 alcohol negative lactic acid 2.4.  Underwent emergent 4 compartment fasciotomy left leg 07/02/2022 per Dr. Marcelino Scot with close reduction left tibial plateau application of spanning external fixator left knee aspiration  of left knee joint.  Nonweightbearing left lower extremity placed in a knee brace unlocked only needs to be on when out of bed.  Surgical course complicated by traumatic rhabdomyolysis CK 1513 improved to 405.  He was cleared for Lovenox for DVT prophylaxis.  Conservative care advised a T7 compression fracture.  Hospital course ileus nasogastric tube inserted diet slowly advance.  Acute blood loss anemia 7.7 and monitored.  Course complicated by some agitation question alcohol withdrawal versus hepatic encephalopathy that continued to improve.  Initial urinary retention placed on Flomax.  Therapy evaluations completed due to patient decreased functional mobility was admitted for a comprehensive rehab program.   Hospital Course: Orla Allred was admitted to rehab 07/16/2022 for inpatient therapies to consist of PT, ST and OT at least three hours five days a week. Past admission physiatrist, therapy team and rehab RN have worked together to provide customized collaborative inpatient rehab.  Pertaining to patient's left tibial plateau fracture with compartment syndrome complicated by traumatic rhabdomyolysis.  Status post closed reduction as well as aspiration of knee 4 compartment fasciotomy 07/01/2022.  Nonweightbearing with hinged knee brace when out of bed.  Dressing changes for fasciotomy site per orthopedic services.  He was completing a course of doxycycline for wound coverage.  Lovenox for DVT prophylaxis venous Doppler studies negative patient could transition to Xarelto to complete course.  Pain managed with use of oxycodone as needed .  Mood stabilization/anxiety with Klonopin as advised and low-dose Zoloft resumed.  Acute blood loss anemia stable 8.6.  Postoperative ileus weaned from TNA diet slowly advance to regular.  Traumatic rhabdomyolysis CK improved to 405 and monitored.  History of alcohol use alcohol negative on admission providing instructions on cessation of alcohol products.  Urinary retention  Foley tube removed maintained on low-dose Flomax voiding without difficulty.  Incidental findings of bilateral adrenal 16 mm on left 10 mm on right follow-up outpatient CT MRI.   Blood pressures were monitored on TID basis and controlled     Rehab course: During patient's stay in rehab weekly team conferences were held to monitor patient's progress, set goals and discuss barriers to discharge. At admission, patient required moderate assist 6 feet rolling walker moderate assist step pivot transfers  Physical exam.  Blood pressure 109/68 pulse 99 temperature 98 respirations 16 oxygen saturations 98% room air Constitutional.  No acute distress HEENT Head.  Normocephalic and atraumatic Eyes.  Pupils round and reactive  to light no discharge without nystagmus Neck.  Supple nontender no JVD without thyromegaly Cardiac regular rate and rhythm without any extra sounds or murmur heard Abdomen.  Soft nontender positive bowel sounds without rebound Respiratory effort normal no respiratory distress without wheeze Musculoskeletal Comments.  Upper extremities 5/5 Right lower extremity 5/5 in hip flexors, knee extension knee flexion dorsi plantarflexion Left lower extremity hip flexion 4/5 knee extension due to pain, but at least 3/5, dorsi plantarflexion least 4/5 Skin.  Fasciotomy site dressed.  There was a large area of psoriasis plaque left thigh.  He/She  has had improvement in activity tolerance, balance, postural control as well as ability to compensate for deficits. He/She has had improvement in functional use RUE/LUE  and RLE/LLE as well as improvement in awareness.  Completed bed mobility modified independent.  Propels wheelchair independently.  Maintains weightbearing status.  Sit to stand to stairs with contact-guard for safety.  Hopped up and down 4 stairs contact-guard to handrails.  Squat pivot transfer with supervision to mat table.  Supine to sit on mat table without assistance.  Needed  some assist for lower body ADLs.  Accurately places 4 out of 4 medications and twice daily pill organizer.  Full family teaching completed plan discharge to home       Disposition: Discharge to home    Diet: Regular  Special Instructions: No driving smoking or alcohol  Touchdown weightbearing left lower extremity with knee brace when out of bed  Leave wounds open to air.  Okay to shower and clean all wounds with soap and water  Medications at discharge. 1.  Tylenol as needed 2.  Vitamin D 2000 units p.o. twice daily 3.  Klonopin 2 mg p.o. twice daily 4.  Oxycodone 1 to 2 tablets every 4 hours as needed pain 5.  Flomax 0.4 mg daily 6.  Xarelto 15 mg daily x10 days and stop 7.  Doxycycline 100 mg every 12 hours x1 day and stop  30-35 minutes were spent completing discharge summary and discharge planning     Follow-up Information     Jennye Boroughs, MD Follow up.   Specialty: Physical Medicine and Rehabilitation Why: No formal follow-up needed Contact information: [address] [place] [address] [phone]         Altamese Laurel, MD Follow up.   Specialty: Orthopedic Surgery Why: Call for appointment Contact information: [place] [address] [phone]                 Signed: Lavon Paganini [place] 07/22/2022, 5:27 AM

## 2022-07-20 NOTE — Progress Notes (Signed)
Occupational Therapy Session Note  Patient Details  Name: Ryan Lynch MRN: [id number] Date of Birth: 1981-07-17   Session 1 Today's Date: 07/20/2022 OT Individual Time: 0488-8916 OT Individual Time Calculation (min): 55 min   Session 2 Today's Date: 07/20/2022 OT Individual Time: 1330-1430 OT Individual Time Calculation (min): 60 min     Short Term Goals: Week 1:  OT Short Term Goal 1 (Week 1): Pt will be Supervision for LB bathing/dressing OT Short Term Goal 2 (Week 1): Pt will be Supervision for toileting OT Short Term Goal 3 (Week 1): Pt will demonstrate safe setup for transfers with Supervision OT Short Term Goal 4 (Week 1): Pt will complete functional transfers with Supervision  Skilled Therapeutic Interventions/Progress Updates:    Session 1 Pt received sitting EOB with no c/o pain, agreeable to OT session. He was not wearing his bledsoe knee brace. Pt requested to change shorts and underwear d/t spilling a drink on himself. He was able to change both EOB sit <> stand with (S). Donned knee brace with min A- good carryover of technique/fit. Pt completed a stand pivot transfer to the w/c with (S). He donned shoes with (S). Oral care with set up assist seated. He propelled the w/c to the tub room with (S). He required mod cueing to remove leg rests and for w/c management. He completed a tub shower transfer with TTB following a demonstration with (S). He does require cueing for safety/sequencing with RW management. He propelled the w/c to the therapy gym and transferred to the mat. He worked on dynamic standing balance with reaching posteriorly and then across midline with alternating UE support on the RW. CGA overall provided. He completed 2 trials of 10x reaching. Pt completed single leg squats to work on ADL transfers from lower surfaces- 3x10 repetitions with close CGA. Rest breaks were taken in supine to provide passive L knee extension. Pt passed off to PT in the hallway for next  session.    Session 2 Pt received supine with no c/o pain, agreeable to OT session. He came to EOB with (S). He donned his L knee brace with (S). (S) stand pivot transfer to the w/c. He propelled the w/c to the therapy gym with (S), min cueing/education provided re technique to protect shoulder integrity. He completed standing level Wii bowling activity, sit <> stands each set, 10x with (S) overall. He required min cueing for technique to use Wii and then was able to carryover and play 10x with CGA for UE removal from the RW. He required a seated rest break following. He requested to go outside for fresh air and propelled w/c 400+ ft- performed to increase UE endurance and strengthening. He required an extended rest break 250 ft in. He sat in the sun for several minutes to help with his psoriasis. He returned inside and completed 20 ft of functional mobility with the RW with (S). He was left supine with all needs met, bed alarm set.     Therapy Documentation Precautions:  Precautions Precautions: Fall Required Braces or Orthoses: Other Brace (knee brace unlocked) Knee Immobilizer - Left: Other (comment) Other Brace: hinged knee brace, unlocked on when OOB able to d/c supine per ortho notes 8/28 Restrictions Weight Bearing Restrictions: Yes LLE Weight Bearing: Non weight bearing Other Position/Activity Restrictions: no ROM restrictions  Therapy/Group: Individual Therapy  Curtis Sites 07/20/2022, 7:49 AM

## 2022-07-20 NOTE — Progress Notes (Addendum)
Patient ID: Ryan Lynch, male   DOB: 08-20-1981, 41 y.o.   MRN: [id number]  Met with pt to give team conference update regarding goals of mod/I level and target discharge date of 9/9. Have left a message for his Mom regarding this information. Will await her return call and work on discharge needs.  37;54 Pm Spoke with father via telephone to update regarding team conference goals of mod/I level and discharge this Saturday. Have scheduled family education on Friday from 9:00-11:00 and both parents will be here. He feels they can borrow a rolling walker so to get the wheelchair. He is going to [place] will look for a tub bench while he is there. Will see on Friday.

## 2022-07-21 DIAGNOSIS — T79A0XS Compartment syndrome, unspecified, sequela: Secondary | ICD-10-CM

## 2022-07-21 NOTE — Plan of Care (Signed)
Goals downgraded due to slower than anticipated progress and variability of performance from day to day due to pain.  Problem: RH Problem Solving Goal: LTG Patient will demonstrate problem solving for (SLP) Description: LTG:  Patient will demonstrate problem solving for basic/complex daily situations with cues  (SLP) Flowsheets Taken 07/21/2022 2021 by Sarita Bottom A, CCC-SLP LTG Patient will demonstrate problem solving for: Minimal Assistance - Patient > 75% Taken 07/17/2022 1232 by Jackalyn Lombard L, CCC-SLP LTG: Patient will demonstrate problem solving for (SLP): Complex daily situations   Problem: RH Memory Goal: LTG Patient will use memory compensatory aids to (SLP) Description: LTG:  Patient will use memory compensatory aids to recall biographical/new, daily complex information with cues (SLP) Flowsheets (Taken 07/21/2022 2021) LTG: Patient will use memory compensatory aids to (SLP): Minimal Assistance - Patient > 75%   Problem: RH Attention Goal: LTG Patient will demonstrate this level of attention during functional activites (SLP) Description: LTG:  Patient will will demonstrate this level of attention during functional activites (SLP) Flowsheets Taken 07/21/2022 2021 by Sarita Bottom A, CCC-SLP LTG: Patient will demonstrate this level of attention during cognitive/linguistic activities with assistance of (SLP): Minimal Assistance - Patient > 75% Taken 07/17/2022 1232 by Maryjane Hurter, CCC-SLP Patient will demonstrate during cognitive/linguistic activities the attention type of: Selective Patient will demonstrate this level of attention during cognitive/linguistic activities in: Home

## 2022-07-21 NOTE — Progress Notes (Signed)
Physical Therapy Session Note  Patient Details  Name: Ryan Lynch MRN: [id number] Date of Birth: 03/08/1981  Today's Date: 07/21/2022 PT Individual Time: 0915-1015 PT Individual Time Calculation (min): 60 min   Short Term Goals: Week 1:  PT Short Term Goal 1 (Week 1): STG=LTG due to ELOS  Skilled Therapeutic Interventions/Progress Updates:  Patient greeted sitting upright in wheelchair in room and agreeable to PT treatment session. Patient did not have his L hinged-knee brace on while sitting in the wc- Patient educated on needing to have brace donned with all mobility. Patient agreeable and reported understanding as he was "confused" earlier.   Patient propelled manual wheelchair to/from his room and ortho gym with RW and distant supervision for safety. Patient is able to manage all of the parts on his wheelchair independently.   Patient was able to set-up a squat pivot transfer from wc to mat table with supervision for safety. Once seated patent was tasked with donning L hinged-knee brace. Patient was attempting to don brace on R and L LE, requiring VC for only donning to L LE.   Patient transitioned to/from sitting EOB, long sitting and supine independently.   While supine, patient performed LE exercises in order to improve ROM and strength- R SAQ with 5#, 3 x 10 L SAQ AROM with overpressure at end ROM, 3 x 10 B Ankle pumps, x20 L heel slides x10  L ankle propped on towel roll in order to promote knee extension with therapist providing overpressure at end ROM in order to improve end ROM.  Throughout therex, patient required verbal and tactile cues for decreased cadence and improved technique.    Patient was able to re-don L hinged-knee brace prior to mobilizing toward wc without any verbal cues.   Patient was able to verbalize proper way to set-up his wheelchair in order to transfer from EOM to wc. Patient performed stand/squat pivot transfer with Supervision.   Patient propelled  manual wc back to his room from main rehab gym with distant supervision- Patient was able to independently locate his room.    Patient returned to his room sitting upright in wheelchair with call bell within reach and all needs met.    Therapy Documentation Precautions:  Precautions Precautions: Fall Required Braces or Orthoses: Other Brace (knee brace unlocked) Knee Immobilizer - Left: Other (comment) Other Brace: hinged knee brace, unlocked on when OOB able to d/c supine per ortho notes 8/28 Restrictions Weight Bearing Restrictions: Yes LLE Weight Bearing: Non weight bearing Other Position/Activity Restrictions: no ROM restrictions   Therapy/Group: Individual Therapy  Ericca Labra 07/21/2022, 7:47 AM

## 2022-07-21 NOTE — Progress Notes (Signed)
Physical Therapy Session Note  Patient Details  Name: Ryan Lynch MRN: [id number] Date of Birth: [birth year]  Today's Date: 07/21/2022 PT Individual Time: 1116-1200 PT Individual Time Calculation (min): 44 min   Short Term Goals: Week 1:  PT Short Term Goal 1 (Week 1): STG=LTG due to ELOS  Skilled Therapeutic Interventions/Progress Updates: Pt presents sitting in w/c, handed off from ST.  Per ST, pt w/ 9/10 pain from earlier therapy, and just received pain meds.  Pt wheeled 150' w/ independence into small gym and negotiated ramp w/ supervision to CGA 2/2 impulsiveness.  Pt wheeled through ADL suite for changes in surface and negotiation in confined spaces.  Pt requires verbal cues for negotiating w/ elevated leg rest.  Pt wheeled to main gym w/ min directions.  Pt negotiated 4 steps w/ B rails, but increased verbal cues for safety and cadence.  Pt performed 2nd time w/ improved cadence.  Pt performed seated overhead press and chest press w/ 4# weighted bar .  Pt returned to room and doffs hinged knee brace before transferring tobed, requiring min A from PT to don.  Pt very impulsive but then also aware of impulsivity.  Pt transfer squat pivot w/c > be w/ supervision and then to supine.  Pt doffs knee brace and remains in bed.  Bed alarm on and all needs in reach.     Therapy Documentation Precautions:  Precautions Precautions: Fall Required Braces or Orthoses: Other Brace (knee brace unlocked) Knee Immobilizer - Left: Other (comment) Other Brace: hinged knee brace, unlocked on when OOB able to d/c supine per ortho notes 8/28 Restrictions Weight Bearing Restrictions: Yes LLE Weight Bearing: Non weight bearing Other Position/Activity Restrictions: no ROM restrictions General:   Vital Signs:   Pain:9/10       Therapy/Group: Individual Therapy  Lucio Edward 07/21/2022, 12:22 PM

## 2022-07-21 NOTE — Progress Notes (Signed)
Speech Language Pathology Daily Session Note  Patient Details  Name: Ryan Lynch MRN: [id number] Date of Birth: Feb 27, 1981  Today's Date: 07/21/2022 SLP Individual Time: 3646-8032 SLP Individual Time Calculation (min): 45 min  Short Term Goals: Week 1: SLP Short Term Goal 1 (Week 1): STG=LTG due to ELOS  Skilled Therapeutic Interventions: S: Pt seen this date for skilled ST intervention targeting cognitive-linguistic goals outlined above. Pt received awake/alert and OOB in w/c. Upon arrival, pt stating that his leg brace is not fitting properly; PT came back to provide education on proper brace placement (pt with poor problem-solving). Endorses 9 out of 10 pain in L leg; RN aware and provided pt with PRN pain medication towards the end of today's session. Pt states that he is not sure how well he will do on cognitive tasks given severity of pain, though agreeable to ST intervention in speech office. Pt appeared very distractible and disorganized this date, in comparison to previous session on 9/6.  O: SLP facilitated today's session by providing skilled pt education re: engagement of metacognitive skills r/t preparing/planning for completion of higher level iADL tasks (e.g. medication and financial management), particularly as it pertains to pain level. Provided additional education on implementation of executive functioning strategies to include double checking for accuracy, talking aloud, checking off completed task, and minimize distractions to optimize accuracy and performance during aforementioned tasks. This session, pt completed semi-complex money management task with 100% accuracy given overall Min-Mod A visual and verbal cues for implementation of executive functioning strategies and for higher-level attention and problem-solving. Pt states that pain likely impacted his performance and states that he felt he did "terrible" when asked how he thought the task went. Sup A for recall of current  medications, though required at least Mod A for recall of tasks completed during last ST session. At the end of today's session, pt required Max A for locking brakes on w/c when he attempted to stand on R leg due to a leg cramp. Required verbal cue to not place adhere to NWB status for LLE.   A: Pt receptive to education provided, though will need reinforcement. Reinforced recommendations for 24/7 supervision and assistance when completing iADL tasks, upon discharge, given consistent need for Sup to Min A verbal cues for accuracy during medication and money management tasks. Additionally, may benefit from short-course of OP ST intervention upon d/c. Will plan to provide family education, as scheduled, on 9/8.  P: Pt returned to room and left in OOB in w/c with all safety measures activated. Direct hand off to PT. Continue per current ST POC next session.   Pain 9 out of 10 pain in L leg; RN notified and provided PRN pain medication. SLP offered distraction.  Therapy/Group: Individual Therapy  Ryan Lynch A Ryan Lynch 07/21/2022, 10:51 AM

## 2022-07-21 NOTE — Discharge Summary (Addendum)
Physical Therapy Discharge Summary  Patient Details  Name: Ryan Lynch MRN: [id number] Date of Birth: 1981-07-02  Date of Discharge from PT service:July 22, 2022  Today's Date: 07/22/2022 PT Individual Time: 1st Treatment Session: [phone]; 2nd Treatment Session: 1400-1500 PT Individual Time Calculation (min): 45 min; 60 min   Patient has met 9 of 9 long term goals due to improved activity tolerance, improved balance, increased strength, decreased pain, and improved awareness.  Patient to discharge at a wheelchair level Supervision.   Patient's care partner is independent to provide the necessary cognitive assistance at discharge.   Recommendation:  Patient will benefit from ongoing skilled PT services in outpatient setting once weight-bearing status has been updated to continue to advance safe functional mobility, address ongoing impairments in decreased global strength, impulsivity, impaired endurance/activity tolerance and minimize fall risk.  Equipment: Manual wheelchair, cushion, RW  Reasons for discharge: treatment goals met and discharge from hospital  Patient/family agrees with progress made and goals achieved: Yes  PT Discharge Precautions/Restrictions Precautions Precautions: Fall Required Braces or Orthoses: Other Brace (L hinged-knee brace) Knee Immobilizer - Left: On when out of bed or walking Other Brace: hinged knee brace, unlocked on when OOB able to d/c supine per ortho notes 8/28 Restrictions Weight Bearing Restrictions: Yes LLE Weight Bearing: Non weight bearing Other Position/Activity Restrictions: no ROM restrictions Pain Interference Pain Interference Pain Effect on Sleep: 2. Occasionally Pain Interference with Therapy Activities: 1. Rarely or not at all Pain Interference with Day-to-Day Activities: 1. Rarely or not at all Vision/Perception  Vision - History Ability to See in Adequate Light: 0 Adequate Perception Perception: Within Functional  Limits Praxis Praxis: Intact  Cognition Overall Cognitive Status: Impaired/Different from baseline Arousal/Alertness: Awake/alert Orientation Level: Oriented X4 Attention: Selective Sustained Attention: Impaired Selective Attention: Appears intact Memory: Impaired Memory Impairment: Decreased recall of new information Awareness: Impaired Awareness Impairment: Anticipatory impairment Problem Solving: Impaired Problem Solving Impairment: Functional complex Safety/Judgment: Impaired Comments: Improvement in safety/judgement Sensation Sensation Light Touch: Impaired Detail Peripheral sensation comments: R knee with numbness on lateral aspect from prior ACL repair. L knee with lateral and medial numbness Light Touch Impaired Details: Impaired LLE Proprioception: Appears Intact Coordination Gross Motor Movements are Fluid and Coordinated: Yes Fine Motor Movements are Fluid and Coordinated: Yes Coordination and Movement Description: Coordinated within LLE precautions Motor  Motor Motor: Within Functional Limits Motor - Discharge Observations: Decreased L LE control due to pain and global weakness  Mobility Bed Mobility Bed Mobility: Rolling Right;Rolling Left;Supine to Sit;Sit to Supine Rolling Right: Independent Rolling Left: Independent Supine to Sit: Independent Sit to Supine: Independent Transfers Transfers: Sit to Stand;Stand to Sit;Stand Pivot Transfers;Squat Pivot Transfers Sit to Stand: Independent with assistive device Stand to Sit: Independent with assistive device Stand Pivot Transfers: Independent with assistive device Squat Pivot Transfers: Independent Transfer (Assistive device): Rolling walker Locomotion  Gait Ambulation: Yes Gait Assistance: Independent with assistive device Gait Distance (Feet): 150 Feet Assistive device: Rolling walker Gait Gait: Yes Stairs / Additional Locomotion Stairs: Yes Stairs Assistance: Independent with assistive  device Stair Management Technique: Two rails Number of Stairs: 4 Height of Stairs: 6 Ramp: Independent with assistive device Curb: Air traffic controller Mobility: Yes Wheelchair Assistance: Independent with Scientist, research (life sciences): Both upper extremities Distance: >150  Trunk/Postural Assessment  Cervical Assessment Cervical Assessment: Within Functional Limits Thoracic Assessment Thoracic Assessment: Within Functional Limits Lumbar Assessment Lumbar Assessment: Within Functional Limits Postural Control Postural Control: Deficits on evaluation (Delayed)  Balance Balance Balance Assessed: Yes  Static Sitting Balance Static Sitting - Balance Support: Bilateral upper extremity supported;Feet supported Static Sitting - Level of Assistance: 6: Modified independent (Device/Increase time) Dynamic Sitting Balance Dynamic Sitting - Balance Support: During functional activity;Right upper extremity supported;Feet supported Dynamic Sitting - Level of Assistance: 6: Modified independent (Device/Increase time) Static Standing Balance Static Standing - Balance Support: Bilateral upper extremity supported;During functional activity Static Standing - Level of Assistance: 6: Modified independent (Device/Increase time) Dynamic Standing Balance Dynamic Standing - Balance Support: During functional activity;Bilateral upper extremity supported Dynamic Standing - Level of Assistance: 6: Modified independent (Device/Increase time) Extremity Assessment  RUE Assessment RUE Assessment: Within Functional Limits LUE Assessment LUE Assessment: Within Functional Limits RLE Assessment RLE Assessment: Within Functional Limits LLE Assessment LLE Assessment: Exceptions to [facility] General Strength Comments: Unable to formally assess MMT at this time secondary to pain- Grossly 3+/5, however ROM limited  Skilled Intervention: 1st Treatment Session- Patient  greeted sitting upright in wheelchair in room and agreeable to PT treatment session. Patient's father and mother present for family training in order to ensure a safe discharge home- Family educated on patient not requiring hands-on assistance, however will require verbal cues for going slow and and taking his time in order to ensure safety with all functional mobility.   At start of treatmtent session, patient educated on re-donning L hinged-knee brace properly. Parents educated as well in order to ensure brace is donned correctly at home- All family members reported understanding.   Patient ascended/descended x4 steps with B HR independently- Therpaist demonstrated how to ascend/descend the steps sideways if the patient is unable reach both HR.   Patient transferred into/out of the car simulator with the use of a RW independently- Patient demonstrated good safety awareness and sequencing throughout transfer. Educated family on how patient would be able to scoot posteriorly on the backseat if he needed to- family reported understanding.    Patient performed various sit/stands, stand pivot transfers with RW and squat pivot transfers ModI- Patient demonstrated good recall of wheelchair set-up and overall safety awareness.   Patient was able to demonstrate bed mobility independently.   Patient propelled manual wheelchair throughout the 4th floor ModI with good obstacle negotiation and awareness.   All questions parents had were answered during the treatment session. Father was invited to afternoon session in order to demonstrate HEP.    Patient returned to his room semi-reclined in bed with parents present and transitioned to OT care.    2nd Treatment Session- Patient greeted semi-reclined in bed with father present and agreeable to PT treatment session.   Therapist sat down with patient and father review HEP in order to ensure understanding prior to discharge home- All questions answered with  demonstration.   Access Code: [email or web address] URL: [email or web address] Date: 07/22/2022 Prepared by: Ryan Lynch Ryan Lynch  Exercises - Seated Ankle Pumps  - Supine Gluteal Sets  - Supine Heel Slide - Supine Heel Slide with Strap - Supine Quad Set - Quad Setting and Stretching - Supine Straight Leg Hip Adduction and Quad Set with Newman Pies   - Sit to Stand with Armchair   - Standing Hip Abduction with Counter Support  - Standing Hip Extension with Counter Support  Patient propelled manual wheelchair to/from his room ModI.   Patient gait trained x150' with RW ModI- Increased time required secondary to impaired endurance and WB restrictions.   Patient was able to ascend/descend x12 steps with B HR ModI.   While seated in his wheelchair, patient performed overhead triceps and chest  press with 5# bar- x30 each exercise.  Once in his room, patient was able to stand with RW and use the reacher to pick up an item from the floor ModI.   Patient performed sit/stand and stand pivot transfer with RW and ModI.   Patient returned to his room sitting semi-reclined in bed with bed alarm on, call bell within reach, father present and all needs met.    Ryan Lynch 07/22/2022, 11:10 AM

## 2022-07-21 NOTE — Progress Notes (Signed)
PROGRESS NOTE   Subjective/Complaints:  Pt seen in physical therapy gym.  Patient is participating well.  No significant pain during therapy. No breathing difficulties.   Review of Systems  Constitutional:  Negative for chills and fever.  Eyes:  Negative for double vision.  Respiratory:  Negative for cough and shortness of breath.   Cardiovascular:  Negative for chest pain and palpitations.  Gastrointestinal:  Negative for abdominal pain, constipation, nausea and vomiting.  Genitourinary:  Negative for dysuria.  Skin:  Positive for rash.  Neurological:  Positive for sensory change and focal weakness.      Objective:   No results found. No results for input(s): "WBC", "HGB", "HCT", "PLT" in the last 72 hours.  No results for input(s): "NA", "K", "CL", "CO2", "GLUCOSE", "BUN", "CREATININE", "CALCIUM" in the last 72 hours.   Intake/Output Summary (Last 24 hours) at 07/21/2022 1038 Last data filed at 07/21/2022 1003 Gross per 24 hour  Intake 600 ml  Output 1025 ml  Net -425 ml         Physical Exam: Vital Signs Blood pressure 114/69, pulse 90, temperature 97.8 F (36.6 C), resp. rate 16, SpO2 97 %. Physical Exam   General: No acute distress Mood and affect are appropriate Heart: Regular rate and rhythm no rubs murmurs or extra sounds Lungs: Clear to auscultation, breathing unlabored, no rales or wheezes Abdomen: Positive bowel sounds, soft nontender to palpation, nondistended Extremities: No clubbing, cyanosis, or edema Skin: No evidence of breakdown, no evidence of rash   Musculoskeletal:     Cervical back: Neck supple. No tenderness.     Comments: UE strength 5/5 RLE 5/5 in HF, KE, KF DF and PF LLE- HF 4/5; KE- due to pain, but is at least 3/5; DF/PF 4/5-   Skin:     General: Skin is warm and dry.     Comments: Surgical incision around L knee and smaller incisions lower leg with sutures in place, no  drainage noted,    Psoriasis plaques- L thigh, R shin near ankle; multiple smaller spots around incisions on LLE and abdomen and back    Neuro:      Comments: Patient is alert.  Follows full commands.    Ox3 Decreased sensation to light touch in LLE below knee- but not absent- otherwise LT intact in other 3 limbs   Assessment/Plan: 1. Functional deficits which require 3+ hours per day of interdisciplinary therapy in a comprehensive inpatient rehab setting. Physiatrist is providing close team supervision and 24 hour management of active medical problems listed below. Physiatrist and rehab team continue to assess barriers to discharge/monitor patient progress toward functional and medical goals  Care Tool:  Bathing    Body parts bathed by patient: Right arm, Left arm, Chest, Abdomen, Front perineal area, Buttocks, Right upper leg, Left upper leg, Left lower leg, Right lower leg, Face         Bathing assist Assist Level: Minimal Assistance - Patient > 75% (for balance)     Upper Body Dressing/Undressing Upper body dressing   What is the patient wearing?: Pull over shirt    Upper body assist Assist Level: Supervision/Verbal cueing    Lower  Body Dressing/Undressing Lower body dressing      What is the patient wearing?: Pants (knee brace)     Lower body assist Assist for lower body dressing: Moderate Assistance - Patient 50 - 74%     Toileting Toileting    Toileting assist Assist for toileting: Independent with assistive device Assistive Device Comment: male urinal   Transfers Chair/bed transfer  Transfers assist     Chair/bed transfer assist level: Supervision/Verbal cueing     Locomotion Ambulation   Ambulation assist      Assist level: Minimal Assistance - Patient > 75% Assistive device: Walker-rolling Max distance: 69ft   Walk 10 feet activity   Assist  Walk 10 feet activity did not occur: Safety/medical concerns (unable to perform due to pain,  weakness, balance/coordination deficits and decreased activity tolerance)  Assist level: Minimal Assistance - Patient > 75% Assistive device: Walker-rolling   Walk 50 feet activity   Assist Walk 50 feet with 2 turns activity did not occur: Safety/medical concerns (unable to perform due to pain, weakness, balance/coordination deficits and decreased activity tolerance)         Walk 150 feet activity   Assist Walk 150 feet activity did not occur: Safety/medical concerns (unable to perform due to pain, weakness, balance/coordination deficits and decreased activity tolerance)         Walk 10 feet on uneven surface  activity   Assist     Assist level: Minimal Assistance - Patient > 75% Assistive device: Walker-rolling   Wheelchair     Assist Is the patient using a wheelchair?: Yes Type of Wheelchair: Manual    Wheelchair assist level: Independent Max wheelchair distance: 171ft    Wheelchair 50 feet with 2 turns activity    Assist        Assist Level: Independent   Wheelchair 150 feet activity     Assist      Assist Level: Independent   Blood pressure 114/69, pulse 90, temperature 97.8 F (36.6 C), resp. rate 16, SpO2 97 %.  Medical Problem List and Plan: 1. Functional deficits secondary to left tibial plateau fracture with compartment syndrome complicated by traumatic rhabdomyolysis.  Status post closed reduction as well as aspiration left knee and 4 compartment fasciotomy 07/01/2022.  Nonweightbearing with hinged knee brace when out of bed unlocked.             -patient may  shower if cover incisions- Per ortho ok to shower and clean wounds with soap and water             -ELOS/Goals: 7-10 days mod I to supervision  -Continue CIR  - [facility] today 9/4- asked to upload picture to chart  -Doxycycline started 9/5 for suspected soft tissue infection with mild erythema around wound noted, appears much improved  today, will plan to continue through 9/9  -Ortho recommending Non weightbearing LLE, ROM to L knee to work on getting full extension, Dc sutures in about 1 week 2.  Antithrombotics: -DVT/anticoagulation:  Pharmaceutical: Lovenox check vascular study             -antiplatelet therapy: N/A 3. Pain Management: Oxycodone as needed- change from dilaudid 4. Mood/Behavior/Sleep as well as history of anxiety: Klonopin 2 mg twice daily             -antipsychotic agents: N/A 5. Neuropsych/cognition: This patient is capable of making decisions on his own behalf. 6. Skin/Wound Care: Routine skin checks 7. Fluids/Electrolytes/Nutrition: Routine in and out  with follow up chemestries 8.  Acute blood loss anemia.  Follow-up CBC  -HGB stable at 8.6 9.  Ileus.  Weaned from [place].  Advance diet as tolerated.  Resolved lg BM this am 10.  Traumatic rhabdomyolysis.  CK1 513 improved to 405 11.  History of alcohol use.  Alcohol level negative on admission.  Monitor for withdrawal. No signs or symptoms of withdrawal at this time noted, also prescribed klonopin as OP 12.  Urinary retention.  Flomax.  Discontinue Foley tube check PVR.- will get out Sunday Am and cath if required- pt rather cath- add lidocaine jelly prn for caths- - 9/5 continent, continue flomax for now 13.  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  Follow-up outpatient CT/MRI 14. Psoriasis- will add triamcinolone  cream BID to plaques- esp since they interfere with his incisions on LLE.  15. Will remove PICC_ not using. .  16. Mildly elevated total bilirubin of 1.3, continue to monitor  -recheck with routine labs monday   LOS: 5 days A FACE TO FACE EVALUATION WAS PERFORMED  Erick Colace 07/21/2022, 10:38 AM

## 2022-07-21 NOTE — Progress Notes (Signed)
Occupational Therapy Session Note  Patient Details  Name: Ryan Lynch MRN: [id number] Date of Birth: February 27, 1981  Today's Date: 07/21/2022 OT Individual Time: 1445-1530 OT Individual Time Calculation (min): 45 min    Short Term Goals: Week 1:  OT Short Term Goal 1 (Week 1): Pt will be Supervision for LB bathing/dressing OT Short Term Goal 2 (Week 1): Pt will be Supervision for toileting OT Short Term Goal 3 (Week 1): Pt will demonstrate safe setup for transfers with Supervision OT Short Term Goal 4 (Week 1): Pt will complete functional transfers with Supervision  Skilled Therapeutic Interventions/Progress Updates:    Patient agreeable to participate in OT session. Reports 8/10 pain level centralized around left knee/incision at start of session. Pt with visible edema and redness noted along incisions.   Patient participated in skilled OT session focusing on edema management techniques including myofascial release and edema massage to left knee in order to decrease edema and pain level and increase ROM needed to improve LB dressing performance. Myofascial release completed to left knee region in order to decrease fascial restrictions and increase joint mobility in a pain free zone. Pt educated on edema management techniques involving ice, ankle A/ROM, elevation of LLE at rest, and performing self edema massage. VC and visual demonstration provided with pt verbalizing understanding. AA/ROM heel slides complete focusing on knee flexion/extension; supine; 10X, 1 set. All functional transfers completed with SBA during session including from w/c to bed/w/c to mat table.  At end of session, pt reports pain level of 6/10. Once in bed, OT provided assist for LLE positioning utilizing pillows. 2 ice packs provided and placed on left knee to decrease edema. Recommended icing for 15-20 minutes.    Therapy Documentation Precautions:  Precautions Precautions: Fall Required Braces or Orthoses: Other Brace  (knee brace unlocked) Knee Immobilizer - Left: Other (comment) Other Brace: hinged knee brace, unlocked on when OOB able to d/c supine per ortho notes 8/28 Restrictions Weight Bearing Restrictions: Yes LLE Weight Bearing: Non weight bearing Other Position/Activity Restrictions: no ROM restrictions    Therapy/Group: Individual Therapy   [name], OTR/L,CBIS  Supplemental OT - [facility] and [facility]  07/21/2022, 2:55 PM

## 2022-07-22 ENCOUNTER — Inpatient Hospital Stay (HOSPITAL_COMMUNITY): Payer: BC Managed Care – PPO

## 2022-07-22 ENCOUNTER — Other Ambulatory Visit (HOSPITAL_COMMUNITY): Payer: Self-pay

## 2022-07-22 DIAGNOSIS — S22060A Wedge compression fracture of T7-T8 vertebra, initial encounter for closed fracture: Secondary | ICD-10-CM | POA: Diagnosis not present

## 2022-07-22 DIAGNOSIS — F341 Dysthymic disorder: Secondary | ICD-10-CM

## 2022-07-22 DIAGNOSIS — F339 Major depressive disorder, recurrent, unspecified: Secondary | ICD-10-CM

## 2022-07-22 DIAGNOSIS — S82145S Nondisplaced bicondylar fracture of left tibia, sequela: Secondary | ICD-10-CM

## 2022-07-22 DIAGNOSIS — S82145A Nondisplaced bicondylar fracture of left tibia, initial encounter for closed fracture: Secondary | ICD-10-CM | POA: Diagnosis not present

## 2022-07-22 DIAGNOSIS — T79A0XA Compartment syndrome, unspecified, initial encounter: Secondary | ICD-10-CM | POA: Diagnosis not present

## 2022-07-22 DIAGNOSIS — F411 Generalized anxiety disorder: Secondary | ICD-10-CM

## 2022-07-22 DIAGNOSIS — G9341 Metabolic encephalopathy: Secondary | ICD-10-CM | POA: Diagnosis not present

## 2022-07-22 MED ORDER — TAMSULOSIN HCL 0.4 MG PO CAPS
0.4000 mg | ORAL_CAPSULE | Freq: Every day | ORAL | 0 refills | Status: DC
  Filled 2022-07-22: qty 30, 30d supply, fill #0

## 2022-07-22 MED ORDER — OXYCODONE-ACETAMINOPHEN 5-325 MG PO TABS
1.0000 | ORAL_TABLET | ORAL | 0 refills | Status: DC | PRN
  Filled 2022-07-22: qty 30, 3d supply, fill #0

## 2022-07-22 MED ORDER — RIVAROXABAN 15 MG PO TABS
15.0000 mg | ORAL_TABLET | Freq: Every day | ORAL | 0 refills | Status: DC
  Filled 2022-07-22: qty 10, 10d supply, fill #0

## 2022-07-22 MED ORDER — SERTRALINE HCL 100 MG PO TABS
100.0000 mg | ORAL_TABLET | Freq: Every day | ORAL | Status: DC
  Administered 2022-07-22 – 2022-07-23 (×2): 100 mg via ORAL
  Filled 2022-07-22 (×2): qty 1

## 2022-07-22 MED ORDER — DOXYCYCLINE HYCLATE 100 MG PO TABS
100.0000 mg | ORAL_TABLET | Freq: Two times a day (BID) | ORAL | 0 refills | Status: DC
  Filled 2022-07-22: qty 2, 1d supply, fill #0

## 2022-07-22 MED ORDER — CLONAZEPAM 2 MG PO TABS
2.0000 mg | ORAL_TABLET | Freq: Two times a day (BID) | ORAL | 0 refills | Status: DC | PRN
  Filled 2022-07-22: qty 30, 15d supply, fill #0

## 2022-07-22 MED ORDER — CLONAZEPAM 1 MG PO TABS
2.0000 mg | ORAL_TABLET | Freq: Two times a day (BID) | ORAL | 0 refills | Status: DC
  Filled 2022-07-22: qty 60, 15d supply, fill #0

## 2022-07-22 MED ORDER — SERTRALINE HCL 100 MG PO TABS
100.0000 mg | ORAL_TABLET | Freq: Every day | ORAL | 0 refills | Status: AC
  Filled 2022-07-22: qty 30, 30d supply, fill #0

## 2022-07-22 MED ORDER — VITAMIN D3 25 MCG PO TABS
2000.0000 [IU] | ORAL_TABLET | Freq: Two times a day (BID) | ORAL | 0 refills | Status: DC
  Filled 2022-07-22: qty 60, 15d supply, fill #0

## 2022-07-22 MED ORDER — CLONAZEPAM 1 MG PO TABS
2.0000 mg | ORAL_TABLET | Freq: Two times a day (BID) | ORAL | 0 refills | Status: DC | PRN
Start: 2022-07-22 — End: 2023-04-12
  Filled 2022-07-22: qty 60, 15d supply, fill #0

## 2022-07-22 MED ORDER — CLONAZEPAM 0.5 MG PO TABS: 2.0000 mg | ORAL_TABLET | Freq: Two times a day (BID) | ORAL | Status: DC | PRN

## 2022-07-22 MED ORDER — ACETAMINOPHEN 325 MG PO TABS: 325.0000 mg | ORAL_TABLET | Freq: Four times a day (QID) | ORAL | Status: DC | PRN

## 2022-07-22 NOTE — Progress Notes (Addendum)
PROGRESS NOTE   Subjective/Complaints:  Reports pain in early morning and evening although it is under control with current medications. Reports LBM today. Plan for DC home tomorrow.    Review of Systems  Constitutional:  Negative for chills and fever.  HENT:  Negative for congestion.   Eyes:  Negative for double vision.  Respiratory:  Negative for cough and shortness of breath.   Cardiovascular:  Negative for chest pain and palpitations.  Gastrointestinal:  Negative for abdominal pain, constipation, nausea and vomiting.  Genitourinary:  Negative for dysuria.  Skin:  Positive for rash.  Neurological:  Positive for sensory change and focal weakness.      Objective:   No results found. No results for input(s): "WBC", "HGB", "HCT", "PLT" in the last 72 hours.  No results for input(s): "NA", "K", "CL", "CO2", "GLUCOSE", "BUN", "CREATININE", "CALCIUM" in the last 72 hours.   Intake/Output Summary (Last 24 hours) at 07/22/2022 0918 Last data filed at 07/22/2022 0746 Gross per 24 hour  Intake 1320 ml  Output 1850 ml  Net -530 ml         Physical Exam: Vital Signs Blood pressure 97/74, pulse 88, temperature 98.5 F (36.9 C), temperature source Oral, resp. rate 16, SpO2 97 %. Physical Exam   General: No acute distress Mood and affect are appropriate Heart: Regular rate and rhythm no rubs murmurs or extra sounds Lungs: Clear to auscultation, breathing unlabored, no rales or wheezes, good air movement Abdomen: Positive bowel sounds, soft nontender to palpation, nondistended Extremities: No clubbing, cyanosis, or edema   Musculoskeletal:     Cervical back: Neck supple. No tenderness.     Comments: UE strength 5/5 RLE 5/5 in HF, KE, KF DF and PF LLE- HF 4/5; KE- due to pain, but is at least 3/5; DF/PF 4/5-  wearing hinged knee brace today Skin:     General: Skin is warm and dry.     Comments: Surgical incision  around L knee and smaller incisions lower leg with sutures in place, no drainage noted,    Psoriasis plaques- L thigh, R shin near ankle; multiple smaller spots around incisions on LLE and abdomen and back    Neuro:      Comments: Patient is alert.  Follows full commands.    Ox3 Decreased sensation to light touch in LLE below knee- but not absent- otherwise LT intact in other 3 limbs   Assessment/Plan: 1. Functional deficits which require 3+ hours per day of interdisciplinary therapy in a comprehensive inpatient rehab setting. Physiatrist is providing close team supervision and 24 hour management of active medical problems listed below. Physiatrist and rehab team continue to assess barriers to discharge/monitor patient progress toward functional and medical goals  Care Tool:  Bathing    Body parts bathed by patient: Right arm, Left arm, Chest, Abdomen, Front perineal area, Buttocks, Right upper leg, Left upper leg, Left lower leg, Right lower leg, Face         Bathing assist Assist Level: Independent with assistive device Assistive Device Comment: seated, sit <> stand   Upper Body Dressing/Undressing Upper body dressing   What is the patient wearing?: Pull over shirt  Upper body assist Assist Level: Independent with assistive device    Lower Body Dressing/Undressing Lower body dressing      What is the patient wearing?: Pants     Lower body assist Assist for lower body dressing: Independent with assitive device     Toileting Toileting    Toileting assist Assist for toileting: Independent with assistive device Assistive Device Comment: male urinal   Transfers Chair/bed transfer  Transfers assist     Chair/bed transfer assist level: Independent with assistive device     Locomotion Ambulation   Ambulation assist      Assist level: Minimal Assistance - Patient > 75% Assistive device: Walker-rolling Max distance: 48ft   Walk 10 feet  activity   Assist  Walk 10 feet activity did not occur: Safety/medical concerns (unable to perform due to pain, weakness, balance/coordination deficits and decreased activity tolerance)  Assist level: Minimal Assistance - Patient > 75% Assistive device: Walker-rolling   Walk 50 feet activity   Assist Walk 50 feet with 2 turns activity did not occur: Safety/medical concerns (unable to perform due to pain, weakness, balance/coordination deficits and decreased activity tolerance)         Walk 150 feet activity   Assist Walk 150 feet activity did not occur: Safety/medical concerns (unable to perform due to pain, weakness, balance/coordination deficits and decreased activity tolerance)         Walk 10 feet on uneven surface  activity   Assist     Assist level: Minimal Assistance - Patient > 75% Assistive device: Walker-rolling   Wheelchair     Assist Is the patient using a wheelchair?: Yes Type of Wheelchair: Manual    Wheelchair assist level: Independent Max wheelchair distance: 17ft    Wheelchair 50 feet with 2 turns activity    Assist        Assist Level: Independent   Wheelchair 150 feet activity     Assist      Assist Level: Independent   Blood pressure 97/74, pulse 88, temperature 98.5 F (36.9 C), temperature source Oral, resp. rate 16, SpO2 97 %.  Medical Problem List and Plan: 1. Functional deficits secondary to left tibial plateau fracture with compartment syndrome complicated by traumatic rhabdomyolysis.  Status post closed reduction as well as aspiration left knee and 4 compartment fasciotomy 07/01/2022.  Nonweightbearing with hinged knee brace when out of bed unlocked.             -patient may  shower if cover incisions- Per ortho ok to shower and clean wounds with soap and water             -ELOS/Goals: 7-10 days mod I to supervision  -Continue CIR  - [facility] today 9/4- asked to upload  picture to chart  -Doxycycline started 9/5 for suspected soft tissue infection with mild erythema around wound noted, appears much improved today, will plan to continue through 9/9  -9/5 Ortho recommending Non weightbearing LLE, ROM to L knee to work on getting full extension, Dc sutures in about 1 week  -DC tomorrow 2.  Antithrombotics: -DVT/anticoagulation:  Pharmaceutical: Lovenox check vascular study             -antiplatelet therapy: N/A 3. Pain Management: Oxycodone as needed- change from dilaudid 4. Mood/Behavior/Sleep as well as history of anxiety: Klonopin 2 mg twice daily             -antipsychotic agents: N/A  -Seen by Neuropsych- pt with history of  depression, seen by neuropsychology today-appreciate assistance, use of zoloft in the for many years, will restart zoloft 9/8-f/u with his outpatient psychiatrist after DC 5. Neuropsych/cognition: This patient is capable of making decisions on his own behalf. 6. Skin/Wound Care: Routine skin checks 7. Fluids/Electrolytes/Nutrition: Routine in and out with follow up chemestries 8.  Acute blood loss anemia.  Follow-up CBC  -HGB stable at 8.6 9.  Ileus.  Weaned from [place].  Advance diet as tolerated.  Resolved LBM 9/8 10.  Traumatic rhabdomyolysis.  CK1 513 improved to 405 11.  History of alcohol use.  Alcohol level negative on admission.  Monitor for withdrawal. No signs or symptoms of withdrawal at this time noted, also prescribed klonopin as OP 12.  Urinary retention.  Flomax.  Discontinue Foley tube check PVR.- will get out Sunday Am and cath if required- pt rather cath- add lidocaine jelly prn for caths-on flomax - 9/8 continues to be continent, Recent PVRs were not elevated 13.  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  Follow-up outpatient CT/MRI 14. Psoriasis- will add triamcinolone  cream BID to plaques- esp since they interfere with his incisions on LLE.  15. Will remove PICC_ not  using. .  16. Mildly elevated total bilirubin of 1.3, rec f/u with PCP    LOS: 6 days A FACE TO FACE EVALUATION WAS PERFORMED  Fanny Dance 07/22/2022, 9:18 AM

## 2022-07-22 NOTE — Plan of Care (Signed)
  Problem: RH Problem Solving Goal: LTG Patient will demonstrate problem solving for (SLP) Description: LTG:  Patient will demonstrate problem solving for basic/complex daily situations with cues  (SLP) Outcome: Completed/Met   Problem: RH Memory Goal: LTG Patient will use memory compensatory aids to (SLP) Description: LTG:  Patient will use memory compensatory aids to recall biographical/new, daily complex information with cues (SLP) Outcome: Completed/Met   Problem: RH Attention Goal: LTG Patient will demonstrate this level of attention during functional activites (SLP) Description: LTG:  Patient will will demonstrate this level of attention during functional activites (SLP) Outcome: Completed/Met   

## 2022-07-22 NOTE — Progress Notes (Signed)
Patient ID: Ryan Lynch, male   DOB: 1981/09/28, 41 y.o.   MRN: [id number]  Parents were here for education this am and it went well all feel prepared for discharge tomorrow. Equipment delivered and Op-SP referral made will follow up with Dad. Ready for discharge tomorrow.

## 2022-07-22 NOTE — Progress Notes (Signed)
Inpatient Rehabilitation Care Coordinator Discharge Note DC SAT 9/9  Patient Details  Name: Ryan Lynch MRN: [id number] Date of Birth: 1981-11-05   Discharge location: HOME WITH PARENTS WHO CAN PROVIDE 24/7 SUPERVISION  Length of Stay: 7 DAYS  Discharge activity level: SUPERVISION-MOD/I LEVEL  Home/community participation: ACTIVE  Patient response JZ:PHXTAV Literacy - How often do you need to have someone help you when you read instructions, pamphlets, or other written material from your doctor or pharmacy?: Never  Patient response WP:VXYIAX Isolation - How often do you feel lonely or isolated from those around you?: Never  Services provided included: MD, RD, PT, OT, SLP, RN, CM, TR, Pharmacy, Neuropsych, SW  Financial Services:  Field seismologist Utilized: [employer]  Choices offered to/list presented to: PT AND DAD  Follow-up services arranged:  Outpatient, DME, Patient/Family has no preference for HH/DME agencies    Outpatient Servicies: [facility] WILL CALL DAD TO SET UP APPOINTMENT.  WILL NEED OUTPATIENT PT ONCE CAN WEIGHT BEAR DME : [facility] WILL BORROW ROLLING WALKER FROM NEIGHBOR    Patient response to transportation need: Is the patient able to respond to transportation needs?: Yes In the past 12 months, has lack of transportation kept you from medical appointments or from getting medications?: No In the past 12 months, has lack of transportation kept you from meetings, work, or from getting things needed for daily living?: No    Comments (or additional information):PARENTS WERE HERE FOR HANDS ON EDUCATION FEEL COMFORTABLE WITH CARE NEEDS. PT FEELS READY TO GO HOME.  Patient/Family verbalized understanding of follow-up arrangements:  Yes  Individual responsible for coordination of the follow-up plan: Ryan Lynch [phone]  Confirmed correct DME delivered: Ryan Lynch 07/22/2022    Ryan Lynch,  Ryan Lynch

## 2022-07-22 NOTE — Progress Notes (Addendum)
Speech Language Pathology Discharge Summary  Patient Details  Name: Ryan Lynch MRN: [id number] Date of Birth: 02-22-1981  Date of Discharge from SLP service:July 22, 2022  Today's Date: 07/22/2022 SLP Individual Time: 1120-1200 SLP Individual Time Calculation (min): 40 min   Skilled Therapeutic Interventions:  S: Pt seen this date for skilled ST intervention targeting cognitive-linguistic goals in care plan. Pt received awake/alert and lying in bed. Agreeable to ST intervention at bedside.  O: SLP facilitated today's session by providing post-tx assessment via SLUMS. On initial evaluation, pt scored 20/30 on SLUM, in comparison to today, in which pt scored 27/30 (n = 27). SLP provided verbal education re: healthy brain lifestyle, compensatory memory strategies, and use of metacognitive strategies. Pt's family not present; however, pt called mother for SLP to discuss recommendations for supervision and assistance with all iADL tasks (money + medication management, cooking, etc.); she verbalized understanding. Pt endorses that he is still not at his cognitive baseline.  A: Will plan to discharge from [place] services this date, given upcoming hospital discharge. Recommend assistance with all iADL's and OP ST. Please see below for discharge summary.  P: Pt left in bed with all safety measures activated. Call bell reviewed and within reach and all immediate needs met.   Patient has met 3 of 3 long term goals.  Patient to discharge at [facility] level.  Reasons goals not met: N/A   Clinical Impression/Discharge Summary:    Pt has demonstrated functional gains as evident by meeting 3 out of 3 long-term goals. Pt and family education completed. Recommend 24/7 supervision and assistance with all iADL tasks (pt's mother aware and voiced understanding). Recommend OP ST intervention to address ongoing cognitive deficits; pt in agreement.  Care Partner:  Caregiver Able to Provide Assistance: Yes   Type of Caregiver Assistance: Cognitive;Physical  Recommendation:  Outpatient SLP;24 hour supervision/assistance  Rationale for SLP Follow Up: Maximize cognitive function and independence;Reduce caregiver burden   Equipment: N/A   Reasons for discharge: Discharged from hospital   Patient/Family Agrees with Progress Made and Goals Achieved: Yes    Abhimanyu Cruces A Saliha Salts 07/22/2022, 9:01 PM

## 2022-07-22 NOTE — Progress Notes (Signed)
Occupational Therapy Discharge Summary  Patient Details  Name: Ryan Lynch MRN: [id number] Date of Birth: 28-Aug-1981  Date of Discharge from OT service:July 22, 2022  Today's Date: 07/22/2022 OT Individual Time: 8295-6213 OT Individual Time Calculation (min): 55 min    Patient has met 11 of 12 long term goals due to improved activity tolerance, improved balance, postural control, ability to compensate for deficits, improved attention, improved awareness, and improved coordination.  Patient to discharge at overall Modified Independent level.  Patient's care partner is independent to provide the necessary cognitive assistance at discharge.  [name] has made great progress in CIR and progressed to a mod I level overall with his ADLs and transfers. He is able to mange his bledsoe brace without assist and adheres to his precautions well. Family education was completed with his parents.   Reasons goals not met: Pt's housekeeping goal is not relevant to pt at standing level, as his parents will complete this task.   Recommendation:  No ongoing OT needs, recommend continuation of SLP and PT.   Equipment: TTB  Reasons for discharge: treatment goals met and discharge from hospital  Patient/family agrees with progress made and goals achieved: Yes  Skilled OT Intervention: Family education session completed with pt and his parents. Verbal education provided re fall risk reduction, energy conservation strategies, home carryover of transfer training, ADLs, and IADLs. Demonstration completed for pt performance of UB/LB bathing and dressing at mod I  level, toileting hygiene and transfers, and shower transfers. Recommended use of the TTB and followed up with CSW on purchase. Remainder of session focused on dynamic standing balance, functional activity tolerance, and BUE strengthening. He stood from the mat and completed unilateral UE strengthening circuit with a 4lb dumbbell while maintaining LLE NWB  precautions. Cueing required for postural control and proper technique. He completed 3 sets of this circuit. Rest breaks were completed supine so his L knee received a passive stretch into extension. He completed 2 rounds of 10x core flexion from the mat to increase postural stability in standing/sitting. He returned to his w/c and his room. He was left sitting EOB with all needs met.    OT Discharge Precautions/Restrictions  Precautions Precautions: Fall Required Braces or Orthoses: Other Brace Other Brace: hinged knee brace, unlocked on when OOB able to d/c supine per ortho notes 8/28 Restrictions Weight Bearing Restrictions: Yes LLE Weight Bearing: Non weight bearing Other Position/Activity Restrictions: no ROM restrictions  Pain Assessment Pain Scale: 0-10 Pain Score: 9  Pain Type: Acute pain;Surgical pain Pain Location: Leg Pain Orientation: Left ADL ADL Eating: Independent Where Assessed-Eating: Edge of bed Grooming: Independent Where Assessed-Grooming: Sitting at sink Upper Body Bathing: Modified independent Where Assessed-Upper Body Bathing: Sitting at sink Lower Body Bathing: Modified independent Where Assessed-Lower Body Bathing: Sitting at sink Upper Body Dressing: Modified independent (Device) Where Assessed-Upper Body Dressing: Sitting at sink Lower Body Dressing: Modified independent Where Assessed-Lower Body Dressing: Sitting at sink Toileting: Modified independent Where Assessed-Toileting: Glass blower/designer: Modified Programmer, applications Method: Arts development officer: Energy manager: Modified independent Clinical cytogeneticist Method: Optometrist: Facilities manager: Curator Method: Radiographer, therapeutic: Civil engineer, contracting with back Vision Baseline Vision/History: 1 Wears glasses Patient Visual Report: No change from baseline Vision  Assessment?: No apparent visual deficits Perception  Perception: Within Functional Limits Praxis Praxis: Intact Cognition Cognition Overall Cognitive Status: Impaired/Different from baseline Arousal/Alertness: Awake/alert Orientation Level: Person;Place;Situation Person: Oriented Place:  Oriented Situation: Oriented Memory: Impaired Memory Impairment: Decreased recall of new information Attention: Selective Selective Attention: Appears intact Awareness: Impaired Awareness Impairment: Anticipatory impairment Problem Solving: Impaired Problem Solving Impairment: Functional complex Safety/Judgment: Impaired Comments: Improvement in safety/judgement Brief Interview for Mental Status (BIMS) Repetition of Three Words (First Attempt): 3 Temporal Orientation: Year: Correct Temporal Orientation: Month: Accurate within 5 days Temporal Orientation: Day: Correct Recall: "Sock": Yes, no cue required Recall: "Blue": Yes, no cue required Recall: "Bed": Yes, no cue required BIMS Summary Score: 15 Sensation Sensation Light Touch: Impaired Detail Peripheral sensation comments: R knee with numbness on lateral aspect from prior ACL repair. L knee with lateral and medial numbness Light Touch Impaired Details: Impaired LLE Proprioception: Appears Intact Coordination Gross Motor Movements are Fluid and Coordinated: Yes Fine Motor Movements are Fluid and Coordinated: Yes Coordination and Movement Description: Coordinated within LLE precautions Motor  Motor Motor: Within Functional Limits Mobility  Bed Mobility Bed Mobility: Rolling Right;Rolling Left;Supine to Sit;Sit to Supine Rolling Right: Independent Rolling Left: Independent Supine to Sit: Independent Sit to Supine: Independent Transfers Sit to Stand: Independent with assistive device Stand to Sit: Independent with assistive device  Trunk/Postural Assessment  Cervical Assessment Cervical Assessment: Within Functional  Limits Thoracic Assessment Thoracic Assessment: Within Functional Limits Lumbar Assessment Lumbar Assessment: Within Functional Limits Postural Control Postural Control: Deficits on evaluation (delayed in standing)  Balance Balance Balance Assessed: Yes Static Sitting Balance Static Sitting - Balance Support: Bilateral upper extremity supported;Feet supported Static Sitting - Level of Assistance: 6: Modified independent (Device/Increase time) Dynamic Sitting Balance Dynamic Sitting - Balance Support: During functional activity;Right upper extremity supported;Feet supported Dynamic Sitting - Level of Assistance: 6: Modified independent (Device/Increase time) Static Standing Balance Static Standing - Balance Support: Bilateral upper extremity supported;During functional activity Static Standing - Level of Assistance: 6: Modified independent (Device/Increase time) Dynamic Standing Balance Dynamic Standing - Balance Support: During functional activity;Bilateral upper extremity supported Dynamic Standing - Level of Assistance: 6: Modified independent (Device/Increase time) Extremity/Trunk Assessment RUE Assessment RUE Assessment: Within Functional Limits LUE Assessment LUE Assessment: Within Functional Limits   Curtis Sites 07/22/2022, 10:25 AM

## 2022-07-22 NOTE — Consult Note (Signed)
Neuropsychological Consultation   Patient:   Ryan Lynch   DOB:   04-01-1981  MR Number:  [id number]  Location:  [facility] [address] CENTER [address] [id number] [place] [address] Dept: [phone] Loc: [phone]           Date of Service:   07/22/2022  Start Time:   8 AM End Time:   9 AM  Provider/Observer:  Arley Phenix, Psy.D.       Clinical Neuropsychologist       Billing Code/Service: [phone]  Chief Complaint:    Ryan Lynch is a 41 year old male with a past history of anxiety and depression being followed by Dr. Milagros Evener, MD for psychiatric care.  The patient has been on Klonopin as well as Zoloft but Zoloft has been discontinued with initial hospitalization.  Patient was admitted on 07/02/2022 after fall down approximately 8 steps with clear indication eventually of concussive event.  Patient did deny loss of consciousness verbally with the initial evaluation.  Cranial CT scan showed broad-based right scalp hematoma without underlying skull fracture.  There was no acute process noted.  Showed a T7 mild vertebral body compression fracture with approximately 20% height loss.  CT left tibia-fibula showed severely communicated fracture of the lateral tibial plateau.  Fractures involve the medial lateral tibial area.  Once patient had acutely recovered from orthopedic interventions he was referred to [facility] for inpatient rehabilitation services.  Patient was referred for neuropsychological consultation due to ongoing symptoms consistent with postconcussion syndrome, anxiety and a history of depression.  Reason for Service:  Patient was referred for neuropsychological consultation due to coping and adjustment issues after significant polytrauma injuries after fall down a flight of steps.  Below is the HPI for the current admission.  HPI: Ryan Lynch is a 41 year old right-handed male with history of anxiety maintained  on Klonopin as well as Zoloft as well as alcohol use.  Per chart review lives with his parents.  Two-level home bed and bath main level.  Independent prior to admission.  Admitted 07/02/2022 after fall down approximately 8 steps.  Denied loss of consciousness.  Cranial CT scan showed broad-based right scalp hematoma without underlying skull fracture.  Negative noncontrast CT appearance of the brain.  CT cervical spine negative.  CT of the chest abdomen pelvis showed a T7 mild anterior vertebral body compression fracture with approximately 20% height loss.  No acute traumatic injury to the chest abdomen or pelvis.  Incidental finding of bilateral adrenal 16 mm left 10 mm right plan outpatient CT/MRI.  CT of the left tibia-fibula showed severely comminuted fracture of the lateral tibial plateau with 2 cm maximum depression of the articular surface with the depressed area measuring approximately 3 x 3.6 cm.  Fractures involves the medial lateral tibial eminence.  Nondisplaced intra-articular fracture of the posterior medial tibial plateau without significant depression.  Transverse fracture through the proximal tibial metaphysis without significant displacement as well as a large hemarthrosis..  Mild perifascial edema deep to the lateral gastrocnemius muscle with poor definition of the psoas muscle.  Admission chemistries unremarkable except glucose 135, AST 188, ALT 59, alcohol negative, lactic acid 2.4.  Patient underwent emergent 4 compartment fasciotomy left leg 07/02/2022 per Dr. Carola Frost with close reduction left tibial plateau/application of spanning external fixator left knee/aspiration of left knee joint.  Nonweightbearing left lower extremity in place cardiac regular knee brace unlocked and only needs to be on when out of bed.Marland Kitchen  Surgical course complicated by traumatic rhabdomyolysis CK of 1513 improved to 405. He was cleared to begin Lovenox for DVT prophylaxis.  Conservative care advised for T7 compression  fracture.  Hospital course complicated by ileus 8/21 nasogastric tube placed diet slowly advance patient had been on TNA.  Acute blood loss anemia 7.7 and monitored.  Course complicated by some agitation question alcohol withdrawal versus hepatic encephalopathy felt scenario most consistent with withdrawal and monitored.  Initial urinary retention patient has been placed on Flomax with plan to remove Foley tube.  Therapy evaluations completed due to patient decreased functional mobility was admitted for a comprehensive rehab program.    Current Status:  Patient was awake and in his wheelchair when I entered the room.  The patient did have issues related to attention and concentration and describes a number of experiences during his initial hospitalization related to very vivid dreams that were continuing to cause him distress.  Patient was oriented but did appear to have some difficulties with information processing speed and attention.  Patient described his history of anxiety and depression and even previous diagnoses of attention deficit disorder which I doubt the latter is an accurate diagnosis given other history.  Patient has a history of very poor response to psychostimulant medications.  He described ongoing care with Dr. Evelene Croon for psychiatric services and the fact that he had been on Klonopin and Zoloft prior to admission.  Patient has only been taking Klonopin during his hospital stay.  Patient was able to give a general description of his orthopedic injuries but lacked much detail but was aware of and able to address issues related to weight bearing restrictions.  Towards the very end of the appointment the patient's parents came in and describe further his psychiatric history as well as the support he will be receiving once he is discharged.  Behavioral Observation: Ryan Lynch  presents as a 41 y.o.-year-old Right handed Caucasian Male who appeared his stated age. his dress was Appropriate and he  was Well Groomed and his manners were Appropriate to the situation.  his participation was indicative of Appropriate and Redirectable behaviors.  There were physical disabilities noted.  he displayed an appropriate level of cooperation and motivation.     Interactions:    Active Appropriate and Redirectable  Attention:   abnormal and attention span appeared shorter than expected for age  Memory:   abnormal; remote memory intact, recent memory impaired  Visuo-spatial:  not examined  Speech (Volume):  normal  Speech:   normal; normal  Thought Process:  Coherent and Relevant  Though Content:  WNL; not suicidal and not homicidal  Orientation:   person, place, time/date, and situation  Judgment:   Fair  Planning:   Poor  Affect:    Anxious  Mood:    Anxious and Dysphoric  Insight:   Fair  Intelligence:   normal   Medical History:  History reviewed. No pertinent past medical history.       Patient Active Problem List   Diagnosis Date Noted   Generalized anxiety disorder    Closed nondisplaced fracture of left tibial plateau 07/16/2022   Multiple rib fractures 07/11/2022   Compression fracture of body of thoracic vertebra ([name]) 07/11/2022   Sepsis ([name]) 07/09/2022   Acute urinary retention 07/09/2022   Prolonged QT interval 07/09/2022   AKI (acute kidney injury) ([name]) 07/07/2022   Alcohol withdrawal ([name]) 07/05/2022   Acute anemia 07/05/2022   Psoriasis 07/05/2022   Ileus ([name]) 07/04/2022  Acute metabolic encephalopathy 07/04/2022   Traumatic rhabdomyolysis ([name]) 07/03/2022   Observation after surgery 07/02/2022   Tibial plateau fracture, left 07/02/2022   Alcohol use 07/02/2022   Transaminitis 07/02/2022   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[name]) 07/02/2022   Adrenal nodule ([name]) 07/02/2022   Compression fracture of T7 vertebra ([name]) 07/02/2022   Thrombocytopenia ([name]) 07/02/2022   Anxiety 07/02/2022     Psychiatric History:  Patient has a past psychiatric  history including diagnoses of major depressive disorder and generalized anxiety disorder.  He has been followed by a [facility] for some time.  Patient reports also that he has been diagnosed with ADD in the past because of his attentional issues but trials on psychostimulant medications did not go well and he describes exacerbation of his anxiety symptoms when these attempts were made.  Family Med/Psych History: History reviewed. No pertinent family history.  Risk of Suicide/Violence: virtually non-existent patient denies any suicidal or homicidal ideation.  Impression/DX:  Ryan Lynch is a 41 year old male with a past history of anxiety and depression being followed by Dr. Milagros Evener, MD for psychiatric care.  The patient has been on Klonopin as well as Zoloft but Zoloft has been discontinued with initial hospitalization.  Patient was admitted on 07/02/2022 after fall down approximately 8 steps with clear indication eventually of concussive event.  Patient did deny loss of consciousness verbally with the initial evaluation.  Cranial CT scan showed broad-based right scalp hematoma without underlying skull fracture.  There was no acute process noted.  Showed a T7 mild vertebral body compression fracture with approximately 20% height loss.  CT left tibia-fibula showed severely communicated fracture of the lateral tibial plateau.  Fractures involve the medial lateral tibial area.  Once patient had acutely recovered from orthopedic interventions he was referred to [facility] for inpatient rehabilitation services.  Patient was referred for neuropsychological consultation due to ongoing symptoms consistent with postconcussion syndrome, anxiety and a history of depression.  Patient was awake and in his wheelchair when I entered the room.  The patient did have issues related to attention and concentration and describes a number of experiences during his initial hospitalization related to very vivid  dreams that were continuing to cause him distress.  Patient was oriented but did appear to have some difficulties with information processing speed and attention.  Patient described his history of anxiety and depression and even previous diagnoses of attention deficit disorder which I doubt the latter is an accurate diagnosis given other history.  Patient has a history of very poor response to psychostimulant medications.  He described ongoing care with Dr. Evelene Croon for psychiatric services and the fact that he had been on Klonopin and Zoloft prior to admission.  Patient has only been taking Klonopin during his hospital stay.  Patient was able to give a general description of his orthopedic injuries but lacked much detail but was aware of and able to address issues related to weight bearing restrictions.  Towards the very end of the appointment the patient's parents came in and describe further his psychiatric history as well as the support he will be receiving once he is discharged.  Disposition/Plan:  Today we worked on adjustment and coping issues and also address issues of potential need to restart his SSRI medication I discussed this issue with his attending physician as well as his PA and there was going to be attempts to reach out to his treating psychiatrist about restarting his Zoloft prior to discharge.  Diagnosis:  Generalized anxiety disorder and major depressive disorder recurrent         Electronically Signed   _______________________ Arley Phenix, Psy.D. Clinical Neuropsychologist

## 2022-07-23 NOTE — Progress Notes (Signed)
Inpatient Rehabilitation Discharge Medication Review by a Pharmacist  A complete drug regimen review was completed for this patient to identify any potential clinically significant medication issues.  High Risk Drug Classes Is patient taking? Indication by Medication  Antipsychotic No   Anticoagulant Yes Xarelto - DVT ppx  Antibiotic Yes Doxycycline - SSTI  Opioid Yes Oxycodone - pain  Antiplatelet No   Hypoglycemics/insulin No   Vasoactive Medication No   Chemotherapy No   Other Yes Acetaminophen - pain Sertraline - depression Clonazepam - anxiety Tamsulosin - BPH Vitamin D3 - supplement     Type of Medication Issue Identified Description of Issue Recommendation(s)  Drug Interaction(s) (clinically significant)     Duplicate Therapy     Allergy     No Medication Administration End Date     Incorrect Dose     Additional Drug Therapy Needed     Significant med changes from prior encounter (inform family/care partners about these prior to discharge).    Other       Clinically significant medication issues were identified that warrant physician communication and completion of prescribed/recommended actions by midnight of the next day:  No  Time spent performing this drug regimen review (minutes):  20  Rockwell Alexandria, PharmD, [facility] PGY1 Pharmacy Resident 07/23/2022 7:31 AM

## 2022-07-23 NOTE — Progress Notes (Signed)
Patient discharge to home with TOC meds. Patient educated as to how to go about getting handicap parking. Patient stated that he did not have any further questions regarding discharge instructions. Cletis Media, RN

## 2022-07-27 DIAGNOSIS — T79A22D Traumatic compartment syndrome of left lower extremity, subsequent encounter: Secondary | ICD-10-CM | POA: Diagnosis not present

## 2022-07-27 DIAGNOSIS — S82112D Displaced fracture of left tibial spine, subsequent encounter for closed fracture with routine healing: Secondary | ICD-10-CM | POA: Diagnosis not present

## 2022-07-27 DIAGNOSIS — S82142D Displaced bicondylar fracture of left tibia, subsequent encounter for closed fracture with routine healing: Secondary | ICD-10-CM | POA: Diagnosis not present

## 2022-08-17 DIAGNOSIS — S82112D Displaced fracture of left tibial spine, subsequent encounter for closed fracture with routine healing: Secondary | ICD-10-CM | POA: Diagnosis not present

## 2022-08-17 DIAGNOSIS — T79A22D Traumatic compartment syndrome of left lower extremity, subsequent encounter: Secondary | ICD-10-CM | POA: Diagnosis not present

## 2022-08-17 DIAGNOSIS — S82142D Displaced bicondylar fracture of left tibia, subsequent encounter for closed fracture with routine healing: Secondary | ICD-10-CM | POA: Diagnosis not present

## 2022-08-20 DIAGNOSIS — S22060A Wedge compression fracture of T7-T8 vertebra, initial encounter for closed fracture: Secondary | ICD-10-CM | POA: Diagnosis not present

## 2022-08-20 DIAGNOSIS — S82145A Nondisplaced bicondylar fracture of left tibia, initial encounter for closed fracture: Secondary | ICD-10-CM | POA: Diagnosis not present

## 2022-08-20 DIAGNOSIS — G9341 Metabolic encephalopathy: Secondary | ICD-10-CM | POA: Diagnosis not present

## 2022-08-20 DIAGNOSIS — T79A0XA Compartment syndrome, unspecified, initial encounter: Secondary | ICD-10-CM | POA: Diagnosis not present

## 2022-08-24 ENCOUNTER — Ambulatory Visit: Payer: BC Managed Care – PPO | Admitting: Rehabilitative and Restorative Service Providers"

## 2022-09-01 ENCOUNTER — Other Ambulatory Visit: Payer: Self-pay

## 2022-09-01 ENCOUNTER — Encounter: Payer: Self-pay | Admitting: Rehabilitative and Restorative Service Providers"

## 2022-09-01 ENCOUNTER — Ambulatory Visit
Payer: BC Managed Care – PPO | Attending: Orthopedic Surgery | Admitting: Rehabilitative and Restorative Service Providers"

## 2022-09-01 DIAGNOSIS — R262 Difficulty in walking, not elsewhere classified: Secondary | ICD-10-CM | POA: Diagnosis not present

## 2022-09-01 DIAGNOSIS — M6281 Muscle weakness (generalized): Secondary | ICD-10-CM | POA: Insufficient documentation

## 2022-09-01 DIAGNOSIS — M5459 Other low back pain: Secondary | ICD-10-CM | POA: Diagnosis not present

## 2022-09-01 DIAGNOSIS — M79605 Pain in left leg: Secondary | ICD-10-CM | POA: Diagnosis not present

## 2022-09-01 NOTE — Therapy (Signed)
OUTPATIENT PHYSICAL THERAPY LOWER EXTREMITY EVALUATION   Patient Name: Ryan Lynch MRN: [id number] DOB:09/19/1981, 41 y.o., male Today's Date: 09/01/2022   PT End of Session - 09/01/22 1241     Visit Number 1    Date for PT Re-Evaluation 10/28/22    Authorization Type BC/BS    PT Start Time 1230    PT Stop Time 1310    PT Time Calculation (min) 40 min    Activity Tolerance Patient tolerated treatment well    Behavior During Therapy [facility] for tasks assessed/performed             History reviewed. No pertinent past medical history. Past Surgical History:  Procedure Laterality Date   ANTERIOR CRUCIATE LIGAMENT REPAIR Right    APPLICATION OF WOUND VAC Left 07/02/2022   Procedure: APPLICATION OF WOUND VAC;  Surgeon: Myrene Galas, MD;  Location: [facility] OR;  Service: Orthopedics;  Laterality: Left;   APPLICATION OF WOUND VAC Left 07/05/2022   Procedure: APPLICATION OF WOUND VAC;  Surgeon: Myrene Galas, MD;  Location: [facility] OR;  Service: Orthopedics;  Laterality: Left;   CLOSED REDUCTION TIBIA Left 07/02/2022   Procedure: CLOSED REDUCTION TIBIA;  Surgeon: Myrene Galas, MD;  Location: [facility] OR;  Service: Orthopedics;  Laterality: Left;   EXTERNAL FIXATION LEG Left 07/02/2022   Procedure: EXTERNAL FIXATION LEG;  Surgeon: Myrene Galas, MD;  Location: [facility] OR;  Service: Orthopedics;  Laterality: Left;   FASCIOTOMY Left 07/02/2022   Procedure: FASCIOTOMY;  Surgeon: Myrene Galas, MD;  Location: [facility] OR;  Service: Orthopedics;  Laterality: Left;   I & D EXTREMITY Left 07/07/2022   Procedure: IRRIGATION AND DEBRIDEMENT EXTREMITY;  Surgeon: Myrene Galas, MD;  Location: [facility] OR;  Service: Orthopedics;  Laterality: Left;   ORIF TIBIA PLATEAU Left 07/05/2022   Procedure: OPEN REDUCTION INTERNAL FIXATION (ORIF) TIBIAL PLATEAU;  Surgeon: Myrene Galas, MD;  Location: [facility] OR;  Service: Orthopedics;  Laterality: Left;   SECONDARY CLOSURE OF WOUND Left 07/05/2022   Procedure: SECONDARY CLOSURE OF WOUND;   Surgeon: Myrene Galas, MD;  Location: [facility] OR;  Service: Orthopedics;  Laterality: Left;   SECONDARY CLOSURE OF WOUND Left 07/07/2022   Procedure: SECONDARY CLOSURE OF WOUND;  Surgeon: Myrene Galas, MD;  Location: [facility] OR;  Service: Orthopedics;  Laterality: Left;   SHOULDER SURGERY Bilateral    Patient Active Problem List   Diagnosis Date Noted   Generalized anxiety disorder    Closed nondisplaced fracture of left tibial plateau 07/16/2022   Multiple rib fractures 07/11/2022   Compression fracture of body of thoracic vertebra ([name]) 07/11/2022   Sepsis ([name]) 07/09/2022   Acute urinary retention 07/09/2022   Prolonged QT interval 07/09/2022   AKI (acute kidney injury) ([name]) 07/07/2022   Alcohol withdrawal ([name]) 07/05/2022   Acute anemia 07/05/2022   Psoriasis 07/05/2022   Ileus ([name]) 07/04/2022   Acute metabolic encephalopathy 07/04/2022   Traumatic rhabdomyolysis ([name]) 07/03/2022   Observation after surgery 07/02/2022   Tibial plateau fracture, left 07/02/2022   Alcohol use 07/02/2022   Transaminitis 07/02/2022   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[name]) 07/02/2022   Adrenal nodule ([name]) 07/02/2022   Compression fracture of T7 vertebra ([name]) 07/02/2022   Thrombocytopenia ([name]) 07/02/2022   Anxiety 07/02/2022    PCP: Recently changed PCP  REFERRING PROVIDER: Montez Morita, PA-C   REFERRING DIAG: [phone] (ICD-10-CM) - Bicondylar fracture of left tibia Z98.890,Z87.81 (ICD-10-CM) - Status post ORIF of fracture of ankle T79.A29A (ICD-10-CM) - Compartment syndrome of lower leg ([name])   THERAPY DIAG:  Muscle  weakness (generalized)  Difficulty in walking, not elsewhere classified  Pain in right leg  Other low back pain  Rationale for Evaluation and Treatment Rehabilitation  ONSET DATE: 07/02/2022  SUBJECTIVE:   SUBJECTIVE STATEMENT: Fell down approx 8 steps on 07/02/2022 and sustained T7 mild anterior vertebral body compression fracture and a left tibial plateau comminuted  fracture requiring surgical intervention.  On 07/02/2022, pt underwent closed reduction of the left tibial plateau with external fixator placed as well as 4 compartment fasciotomy of left leg.  On 07/05/22 Pt underwent left tibial plateau ORIF.  Both surgeries performed by Dr Carola Frost.  NWB initially, now WBAT with no restrictions listed  PERTINENT HISTORY: B shoulder surgeries (R shoulder 5 years ago, L shoulder approx 20 years ago), R ACL repair approx 10 years ago  PAIN:  Are you having pain? Yes: NPRS scale: 3-8/10 Pain location: Left LE Pain description: aching at rest, sharp with use/walking Aggravating factors: standing and walking Relieving factors: rest  PRECAUTIONS: Fall  WEIGHT BEARING RESTRICTIONS  WBAT LLE  FALLS:  Has patient fallen in last 6 months? Yes. Number of falls 1 fall that caused this injury  LIVING ENVIRONMENT: Lives with: lives with their family Lives in: House/apartment Stairs: Yes: External: 3 steps; can reach both Has following equipment at home: Single point cane, Walker - 2 wheeled, Crutches, Wheelchair (manual), shower chair, and Grab bars  OCCUPATION: Architectural technologist  PLOF: Independent and Leisure: hiking, sight seeing, outdoor activities, target shooting  PATIENT GOALS:  To be able to walk more normally.   OBJECTIVE:   DIAGNOSTIC FINDINGS:  07/22/22 Left Knee Radiograph: FINDINGS: Stable changes of plate and screw fixation of the tibial plateau. Mild residual distraction of sub chondral fragments without seen step-off deformity. No new fracture or dislocation. Small effusion in the suprapatellar bursa  CT of the chest abdomen pelvis showed a T7 mild anterior vertebral body compression fracture with approximately 20% height loss.  PATIENT SURVEYS:  Eval:  FOTO 26% (estimated 56% by visit 19)  COGNITION:  Overall cognitive status:  Pt reports that his memory is getting more back to baseline and he is more alert now      SENSATION: Reports  numbness on bilateral LE  EDEMA:  Edema noted on LLE  MUSCLE LENGTH: Hamstring tightness noted  POSTURE: rounded shoulders and forward head  PALPATION: Mild tenderness to palpation along left lower leg  LOWER EXTREMITY ROM:  09/01/2022:  Right knee is 0-120, Left knee is 0 to 100 degrees  LOWER EXTREMITY MMT:  Left hip and hamstring strength is 4 to 4+/5 Right hip and knee are 5/5  FUNCTIONAL TESTS:  5 times sit to stand: 17.7 sec with UE use  GAIT: Distance walked: 50 ft Assistive device utilized: Walker - 2 wheeled Level of assistance: SBA Comments: Antalgic gait pattern with decreased stance time through LLE    TODAY'S TREATMENT: 09/01/2022:  Reviewed HEP   PATIENT EDUCATION:  Education details: Issued HEP Person educated: Patient Education method: Explanation, Facilities manager, and Handouts Education comprehension: verbalized understanding   HOME EXERCISE PROGRAM: Access Code: [email or web address] URL: [email or web address] Date: 09/01/2022 Prepared by: Clydie Braun Maruice Pieroni  Exercises - Sit to Stand with Armchair  - 1 x daily - 7 x weekly - 2 sets - 10 reps - Supine Bridge  - 1 x daily - 7 x weekly - 2 sets - 10 reps - Supine Heel Slide  - 1 x daily - 7 x weekly - 2 sets - 10 reps - Supine Active  Straight Leg Raise  - 1 x daily - 7 x weekly - 2 sets - 10 reps - Clamshell  - 1 x daily - 7 x weekly - 2 sets - 10 reps  ASSESSMENT:  CLINICAL IMPRESSION: Patient is a 41 y.o. male who was seen today for physical therapy evaluation and treatment for multiple fractures sustained from falling down stairs. Following acute hospitalization, pt underwent inpatient rehabilitation for intensive therapy.  Pt's PLOF is independent and ambulatory without an assistive device.  Pt presents with left knee stiffness, left LE pain, low back pain, muscle weakness, and difficulty walking with decreased balance.  Pt would benefit from skilled PT to address his functional impairments  to allow him to return to independent lifestyle.   OBJECTIVE IMPAIRMENTS decreased activity tolerance, decreased balance, difficulty walking, decreased ROM, decreased strength, increased edema, increased fascial restrictions, increased muscle spasms, impaired flexibility, and pain.   ACTIVITY LIMITATIONS carrying, lifting, bending, standing, squatting, and stairs  PARTICIPATION LIMITATIONS: cleaning, laundry, community activity, and occupation  PERSONAL FACTORS Past/current experiences, Time since onset of injury/illness/exacerbation, and 3+ comorbidities: multiple surgical interventions required following fall  are also affecting patient's functional outcome.   REHAB POTENTIAL: Good  CLINICAL DECISION MAKING: Evolving/moderate complexity  EVALUATION COMPLEXITY: Moderate   GOALS: Goals reviewed with patient? Yes  SHORT TERM GOALS: Target date: 09/22/2022  Pt will be independent with initial HEP. Baseline: Goal status: INITIAL  2.  Pt will be able to participate in a TUG assessment with RW. Baseline:  Goal status: INITIAL   LONG TERM GOALS: Target date: 10/28/2022   Pt will be independent with advanced HEP. Baseline:  Goal status: INITIAL  2.  Pt will report no increase in pain with ambulation with LRAD when walking around the grocery store. Baseline:  Goal status: INITIAL  3.  Pt will increase left knee A/ROM to 0 to 120 degrees to allow for easier navigation of stairs. Baseline:  Goal status: INITIAL  4.  Pt will increase left hip and hamstring strength to at least 5-/5 to allow him to navigate stairs with reciprocal pattern. Baseline:  Goal status: INITIAL  5.  Pt will increase FOTO to at least 56% to demonstrate improvements in functional tasks. Baseline: 26% Goal status: INITIAL  6.  Pt will be able to ambulate for at least 20 minutes with LRAD to allow him to go on easier hikes. Baseline:  Goal status: INITIAL   PLAN: PT FREQUENCY: 2x/week  PT  DURATION: 8 weeks  PLANNED INTERVENTIONS: Therapeutic exercises, Therapeutic activity, Neuromuscular re-education, Balance training, Gait training, Patient/Family education, Self Care, Joint mobilization, Joint manipulation, Stair training, Aquatic Therapy, Dry Needling, Electrical stimulation, Spinal manipulation, Spinal mobilization, Cryotherapy, Moist heat, scar mobilization, Taping, Vasopneumatic device, Ionotophoresis 4mg /ml Dexamethasone, Manual therapy, and Re-evaluation  PLAN FOR NEXT SESSION: assess and progress HEP as indicated, strengthening, flexibility, TUG assessment   Reather Laurence, PT 09/01/2022, 12:42 PM   [facility] [address], [address] [place], [place] [address] Phone # [phone] Fax [phone]

## 2022-09-08 ENCOUNTER — Ambulatory Visit: Payer: BC Managed Care – PPO | Admitting: Physical Therapy

## 2022-09-08 ENCOUNTER — Encounter: Payer: Self-pay | Admitting: Physical Therapy

## 2022-09-08 DIAGNOSIS — R262 Difficulty in walking, not elsewhere classified: Secondary | ICD-10-CM | POA: Diagnosis not present

## 2022-09-08 DIAGNOSIS — M5459 Other low back pain: Secondary | ICD-10-CM

## 2022-09-08 DIAGNOSIS — M6281 Muscle weakness (generalized): Secondary | ICD-10-CM

## 2022-09-08 DIAGNOSIS — M79605 Pain in left leg: Secondary | ICD-10-CM | POA: Diagnosis not present

## 2022-09-08 NOTE — Therapy (Signed)
OUTPATIENT PHYSICAL THERAPY TREATMENT NOTE   Patient Name: Ryan Lynch MRN: [id number] DOB:12/06/1980, 41 y.o., male Today's Date: 09/08/2022   REFERRING PROVIDER: Montez Morita, PA-C    END OF SESSION:   PT End of Session - 09/08/22 1357     Visit Number 2    Date for PT Re-Evaluation 10/28/22    Authorization Type BC/BS    PT Start Time 1400    PT Stop Time 1445    PT Time Calculation (min) 45 min    Activity Tolerance Patient tolerated treatment well    Behavior During Therapy [facility] for tasks assessed/performed             History reviewed. No pertinent past medical history. Past Surgical History:  Procedure Laterality Date   ANTERIOR CRUCIATE LIGAMENT REPAIR Right    APPLICATION OF WOUND VAC Left 07/02/2022   Procedure: APPLICATION OF WOUND VAC;  Surgeon: Myrene Galas, MD;  Location: [facility] OR;  Service: Orthopedics;  Laterality: Left;   APPLICATION OF WOUND VAC Left 07/05/2022   Procedure: APPLICATION OF WOUND VAC;  Surgeon: Myrene Galas, MD;  Location: [facility] OR;  Service: Orthopedics;  Laterality: Left;   CLOSED REDUCTION TIBIA Left 07/02/2022   Procedure: CLOSED REDUCTION TIBIA;  Surgeon: Myrene Galas, MD;  Location: [facility] OR;  Service: Orthopedics;  Laterality: Left;   EXTERNAL FIXATION LEG Left 07/02/2022   Procedure: EXTERNAL FIXATION LEG;  Surgeon: Myrene Galas, MD;  Location: [facility] OR;  Service: Orthopedics;  Laterality: Left;   FASCIOTOMY Left 07/02/2022   Procedure: FASCIOTOMY;  Surgeon: Myrene Galas, MD;  Location: [facility] OR;  Service: Orthopedics;  Laterality: Left;   I & D EXTREMITY Left 07/07/2022   Procedure: IRRIGATION AND DEBRIDEMENT EXTREMITY;  Surgeon: Myrene Galas, MD;  Location: [facility] OR;  Service: Orthopedics;  Laterality: Left;   ORIF TIBIA PLATEAU Left 07/05/2022   Procedure: OPEN REDUCTION INTERNAL FIXATION (ORIF) TIBIAL PLATEAU;  Surgeon: Myrene Galas, MD;  Location: [facility] OR;  Service: Orthopedics;  Laterality: Left;   SECONDARY CLOSURE OF WOUND Left  07/05/2022   Procedure: SECONDARY CLOSURE OF WOUND;  Surgeon: Myrene Galas, MD;  Location: [facility] OR;  Service: Orthopedics;  Laterality: Left;   SECONDARY CLOSURE OF WOUND Left 07/07/2022   Procedure: SECONDARY CLOSURE OF WOUND;  Surgeon: Myrene Galas, MD;  Location: [facility] OR;  Service: Orthopedics;  Laterality: Left;   SHOULDER SURGERY Bilateral    Patient Active Problem List   Diagnosis Date Noted   Generalized anxiety disorder    Closed nondisplaced fracture of left tibial plateau 07/16/2022   Multiple rib fractures 07/11/2022   Compression fracture of body of thoracic vertebra ([name]) 07/11/2022   Sepsis ([name]) 07/09/2022   Acute urinary retention 07/09/2022   Prolonged QT interval 07/09/2022   AKI (acute kidney injury) ([name]) 07/07/2022   Alcohol withdrawal ([name]) 07/05/2022   Acute anemia 07/05/2022   Psoriasis 07/05/2022   Ileus ([name]) 07/04/2022   Acute metabolic encephalopathy 07/04/2022   Traumatic rhabdomyolysis ([name]) 07/03/2022   Observation after surgery 07/02/2022   Tibial plateau fracture, left 07/02/2022   Alcohol use 07/02/2022   Transaminitis 07/02/2022   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[name]) 07/02/2022   Adrenal nodule ([name]) 07/02/2022   Compression fracture of T7 vertebra ([name]) 07/02/2022   Thrombocytopenia ([name]) 07/02/2022   Anxiety 07/02/2022    REFERRING DIAG: G40.102V (ICD-10-CM) - Bicondylar fracture of left tibia Z98.890,Z87.81 (ICD-10-CM) - Status post ORIF of fracture of ankle T79.A29A (ICD-10-CM) - Compartment syndrome of lower leg ([name])   ONSET DATE: 07/02/2022  THERAPY DIAG:  Muscle weakness (generalized)  Difficulty in walking, not elsewhere classified  Other low back pain  Pain in left leg  Rationale for Evaluation and Treatment Rehabilitation  PERTINENT HISTORY:  [place] Seat down approx 8 steps on 07/02/2022 and sustained T7 mild anterior vertebral body compression fracture and a left tibial plateau comminuted fracture requiring surgical intervention.   On 07/02/2022, pt underwent closed reduction of the left tibial plateau with external fixator placed as well as 4 compartment fasciotomy of left leg.  On 07/05/22 Pt underwent left tibial plateau ORIF.  Both surgeries performed by Dr Carola Frost.   NWB initially, now WBAT with no restrictions listed  PRECAUTIONS: WBAT LLE, FALLS  SUBJECTIVE:                                                                                                                                                                                      SUBJECTIVE STATEMENT:  Pt arrives walking without AD, much improved from initial visit.  Pt reports Lt knee feels stiff and rubbery.  He has been doing his HEP.   PAIN:  Are you having pain? Yes: NPRS scale: 3-8/10 Pain location: Left LE Pain description: aching at rest, sharp with use/walking Aggravating factors: standing and walking Relieving factors: rest    OBJECTIVE: (objective measures completed at initial evaluation unless otherwise dated)   DIAGNOSTIC FINDINGS:  07/22/22 Left Knee Radiograph: FINDINGS: Stable changes of plate and screw fixation of the tibial plateau. Mild residual distraction of sub chondral fragments without seen step-off deformity. No new fracture or dislocation. Small effusion in the suprapatellar bursa   CT of the chest abdomen pelvis showed a T7 mild anterior vertebral body compression fracture with approximately 20% height loss.   PATIENT SURVEYS:  Eval:  FOTO 26% (estimated 56% by visit 19)   COGNITION:           Overall cognitive status:  Pt reports that his memory is getting more back to baseline and he is more alert now                                SENSATION: Reports numbness on bilateral LE   EDEMA:  Edema noted on LLE   MUSCLE LENGTH: Hamstring tightness noted   POSTURE: rounded shoulders and forward head   PALPATION: Mild tenderness to palpation along left lower leg   LOWER EXTREMITY ROM:    09/08/22: Lt knee flexion  108 deg 09/01/2022:  Right knee is 0-120, Left knee is 0 to 100 degrees   LOWER EXTREMITY MMT:   Left hip and hamstring strength is 4  to 4+/5 Right hip and knee are 5/5   FUNCTIONAL TESTS:  Eval: 5 times sit to stand: 17.7 sec with UE use  09/08/22:  TUG: 16" with 2-wheel walker; TUG: 11 sec no AD, close supervision 3' walk test with 2-wheel walker: 353'    GAIT: Distance walked: 50 ft Assistive device utilized: Environmental consultant - 2 wheeled Level of assistance: SBA Comments: Antalgic gait pattern with decreased stance time through LLE       TODAY'S TREATMENT: 09/08/22: Pt with ind ambulation without AD from waiting room to NuStep NuStep L1 x 7' PT present to monitor Supine heel slides 2x10 (ROM knee flexion measures 108 today) Lt SLR 2x10  Supine bridge 2x10 SL Lt hip abd 2x10 and clam 2x10 3' walk test with 2-wheel walker: 353' TUG: 11 sec no AD, 16 sec with 2-wheel walker LAQ 10x5" Standing bil heel raises x 20 Supine hamstring curl feet on red physioball x 15 Added SL hip abd and standing bil heel raise to HEP  EVAL: 09/01/2022:  Reviewed HEP     PATIENT EDUCATION:  Education details: Issued HEP Person educated: Patient Education method: Explanation, Demonstration, and Handouts Education comprehension: verbalized understanding     HOME EXERCISE PROGRAM: Access Code: [email or web address] URL: [email or web address] Date: 09/08/2022 Prepared by: Loistine Simas Loman Logan  Exercises - Sit to Stand with Armchair  - 1 x daily - 7 x weekly - 2 sets - 10 reps - Supine Bridge  - 1 x daily - 7 x weekly - 2 sets - 10 reps - Supine Heel Slide  - 1 x daily - 7 x weekly - 2 sets - 10 reps - Supine Active Straight Leg Raise  - 1 x daily - 7 x weekly - 2 sets - 10 reps - Clamshell  - 1 x daily - 7 x weekly - 2 sets - 10 reps - Sidelying Hip Abduction  - 1 x daily - 7 x weekly - 2 sets - 10 reps - Standing Heel Raise  - 1 x daily - 7 x weekly - 2 sets - 20 reps   ASSESSMENT:    CLINICAL IMPRESSION: Patient arrives wearing small velcro knee brace and ambulating without AD with much improved WB into Lt LE.  Lt knee flexion is improved to 108 deg today.  He was able to participate in 3' walk test with 2-wheel walker covering 353' today and TUG with and without 2-wheel walker.  He notes Lt knee pain only when turning corners or changing directions.  He will be ready for addition of ankle weights/resistance next visit for unweighted therex.  Pt does have ongoing back pain which prevents him from sitting and standing fully upright consistently (he sustained spinal compression fractures with his original fall).       OBJECTIVE IMPAIRMENTS decreased activity tolerance, decreased balance, difficulty walking, decreased ROM, decreased strength, increased edema, increased fascial restrictions, increased muscle spasms, impaired flexibility, and pain.    ACTIVITY LIMITATIONS carrying, lifting, bending, standing, squatting, and stairs   PARTICIPATION LIMITATIONS: cleaning, laundry, community activity, and occupation   PERSONAL FACTORS Past/current experiences, Time since onset of injury/illness/exacerbation, and 3+ comorbidities: multiple surgical interventions required following fall  are also affecting patient's functional outcome.    REHAB POTENTIAL: Good   CLINICAL DECISION MAKING: Evolving/moderate complexity   EVALUATION COMPLEXITY: Moderate     GOALS: Goals reviewed with patient? Yes   SHORT TERM GOALS: Target date: 09/22/2022  Pt will be independent with initial HEP. Baseline: Goal status: ongoing  2.  Pt will be able to participate in a TUG assessment with RW. Baseline:  Goal status: met both with and without AD     LONG TERM GOALS: Target date: 10/28/2022    Pt will be independent with advanced HEP. Baseline:  Goal status: INITIAL   2.  Pt will report no increase in pain with ambulation with LRAD when walking around the grocery store. Baseline:  Goal  status: INITIAL   3.  Pt will increase left knee A/ROM to 0 to 120 degrees to allow for easier navigation of stairs. Baseline:  Goal status: INITIAL   4.  Pt will increase left hip and hamstring strength to at least 5-/5 to allow him to navigate stairs with reciprocal pattern. Baseline:  Goal status: INITIAL   5.  Pt will increase FOTO to at least 56% to demonstrate improvements in functional tasks. Baseline: 26% Goal status: INITIAL   6.  Pt will be able to ambulate for at least 20 minutes with LRAD to allow him to go on easier hikes. Baseline:  Goal status: INITIAL     PLAN: PT FREQUENCY: 2x/week   PT DURATION: 8 weeks   PLANNED INTERVENTIONS: Therapeutic exercises, Therapeutic activity, Neuromuscular re-education, Balance training, Gait training, Patient/Family education, Self Care, Joint mobilization, Joint manipulation, Stair training, Aquatic Therapy, Dry Needling, Electrical stimulation, Spinal manipulation, Spinal mobilization, Cryotherapy, Moist heat, scar mobilization, Taping, Vasopneumatic device, Ionotophoresis 4mg /ml Dexamethasone, Manual therapy, and Re-evaluation   PLAN FOR NEXT SESSION: assess and progress HEP as indicated (add ankle weights and band for clam to mat therex next time), strengthening, flexibility,  continue gait training with and without AD, needs supervision for safety   Eladio Dentremont, PT 09/08/22 2:55 PM

## 2022-09-12 NOTE — Therapy (Deleted)
OUTPATIENT PHYSICAL THERAPY TREATMENT NOTE   Patient Name: Ryan Lynch MRN: [id number] DOB:[birth year], 41 y.o., male Today's Date: 09/12/2022   REFERRING PROVIDER: Ainsley Spinner, PA-C    END OF SESSION:     No past medical history on file. Past Surgical History:  Procedure Laterality Date   ANTERIOR CRUCIATE LIGAMENT REPAIR Right    APPLICATION OF WOUND VAC Left 07/02/2022   Procedure: APPLICATION OF WOUND VAC;  Surgeon: Altamese Mayfield, MD;  Location: [place];  Service: Orthopedics;  Laterality: Left;   APPLICATION OF WOUND VAC Left 07/05/2022   Procedure: APPLICATION OF WOUND VAC;  Surgeon: Altamese Marathon, MD;  Location: [place];  Service: Orthopedics;  Laterality: Left;   CLOSED REDUCTION TIBIA Left 07/02/2022   Procedure: CLOSED REDUCTION TIBIA;  Surgeon: Altamese St. Robert, MD;  Location: [place];  Service: Orthopedics;  Laterality: Left;   EXTERNAL FIXATION LEG Left 07/02/2022   Procedure: EXTERNAL FIXATION LEG;  Surgeon: Altamese Winslow, MD;  Location: [place];  Service: Orthopedics;  Laterality: Left;   FASCIOTOMY Left 07/02/2022   Procedure: FASCIOTOMY;  Surgeon: Altamese Gadsden, MD;  Location: [place];  Service: Orthopedics;  Laterality: Left;   I & D EXTREMITY Left 07/07/2022   Procedure: IRRIGATION AND DEBRIDEMENT EXTREMITY;  Surgeon: Altamese Camas, MD;  Location: [place];  Service: Orthopedics;  Laterality: Left;   ORIF TIBIA PLATEAU Left 07/05/2022   Procedure: OPEN REDUCTION INTERNAL FIXATION (ORIF) TIBIAL PLATEAU;  Surgeon: Altamese Delmar, MD;  Location: [place];  Service: Orthopedics;  Laterality: Left;   SECONDARY CLOSURE OF WOUND Left 07/05/2022   Procedure: SECONDARY CLOSURE OF WOUND;  Surgeon: Altamese Fredonia, MD;  Location: [place];  Service: Orthopedics;  Laterality: Left;   SECONDARY CLOSURE OF WOUND Left 07/07/2022   Procedure: SECONDARY CLOSURE OF WOUND;  Surgeon: Altamese Neabsco, MD;  Location: [place];  Service: Orthopedics;  Laterality: Left;   SHOULDER SURGERY Bilateral    Patient  Active Problem List   Diagnosis Date Noted   Generalized anxiety disorder    Closed nondisplaced fracture of left tibial plateau 07/16/2022   Multiple rib fractures 07/11/2022   Compression fracture of body of thoracic vertebra ([place]) 07/11/2022   Sepsis ([place]) 07/09/2022   Acute urinary retention 07/09/2022   Prolonged QT interval 07/09/2022   AKI (acute kidney injury) ([place]) 07/07/2022   Alcohol withdrawal ([place]) 07/05/2022   Acute anemia 07/05/2022   Psoriasis 07/05/2022   Ileus ([place]) 23/30/0762   Acute metabolic encephalopathy 26/33/3545   Traumatic rhabdomyolysis ([place]) 07/03/2022   Observation after surgery 07/02/2022   Tibial plateau fracture, left 07/02/2022   Alcohol use 07/02/2022   Transaminitis 07/02/2022   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[place]) 07/02/2022   Adrenal nodule ([place]) 07/02/2022   Compression fracture of T7 vertebra ([place]) 07/02/2022   Thrombocytopenia ([place]) 07/02/2022   Anxiety 07/02/2022    REFERRING DIAG: G25.638L (ICD-10-CM) - Bicondylar fracture of left tibia Z98.890,Z87.81 (ICD-10-CM) - Status post ORIF of fracture of ankle T79.A29A (ICD-10-CM) - Compartment syndrome of lower leg ([place])   ONSET DATE: 07/02/2022   THERAPY DIAG:  No diagnosis found.  Rationale for Evaluation and Treatment Rehabilitation  PERTINENT HISTORY:  [place] down approx 8 steps on 07/02/2022 and sustained T7 mild anterior vertebral body compression fracture and a left tibial plateau comminuted fracture requiring surgical intervention.  On 07/02/2022, pt underwent closed reduction of the left tibial plateau with external fixator placed as well as 4 compartment fasciotomy of left leg.  On 07/05/22 Pt underwent left tibial plateau ORIF.  Both surgeries  performed by Dr Marcelino Scot.   NWB initially, now WBAT with no restrictions listed  PRECAUTIONS: WBAT LLE, FALLS  SUBJECTIVE:                                                                                                                                                                                       SUBJECTIVE STATEMENT:  Pt arrives walking without AD, much improved from initial visit.  Pt reports Lt knee feels stiff and rubbery.  He has been doing his HEP.   PAIN:  Are you having pain? Yes: NPRS scale: 3-8/10 Pain location: Left LE Pain description: aching at rest, sharp with use/walking Aggravating factors: standing and walking Relieving factors: rest    OBJECTIVE: (objective measures completed at initial evaluation unless otherwise dated)   DIAGNOSTIC FINDINGS:  07/22/22 Left Knee Radiograph: FINDINGS: Stable changes of plate and screw fixation of the tibial plateau. Mild residual distraction of sub chondral fragments without seen step-off deformity. No new fracture or dislocation. Small effusion in the suprapatellar bursa   CT of the chest abdomen pelvis showed a T7 mild anterior vertebral body compression fracture with approximately 20% height loss.   PATIENT SURVEYS:  Eval:  FOTO 26% (estimated 56% by visit 19)   COGNITION:           Overall cognitive status:  Pt reports that his memory is getting more back to baseline and he is more alert now                                SENSATION: Reports numbness on bilateral LE   EDEMA:  Edema noted on LLE   MUSCLE LENGTH: Hamstring tightness noted   POSTURE: rounded shoulders and forward head   PALPATION: Mild tenderness to palpation along left lower leg   LOWER EXTREMITY ROM:    09/08/22: Lt knee flexion 108 deg 09/01/2022:  Right knee is 0-120, Left knee is 0 to 100 degrees   LOWER EXTREMITY MMT:   Left hip and hamstring strength is 4 to 4+/5 Right hip and knee are 5/5   FUNCTIONAL TESTS:  Eval: 5 times sit to stand: 17.7 sec with UE use  09/08/22:  TUG: 16" with 2-wheel walker; TUG: 11 sec no AD, close supervision 3' walk test with 2-wheel walker: 353'    GAIT: Distance walked: 50 ft Assistive device utilized: Walker - 2 wheeled Level of  assistance: SBA Comments: Antalgic gait pattern with decreased stance time through LLE       TODAY'S TREATMENT: 09/08/22: Pt with ind ambulation without AD from waiting room to NuStep NuStep L1  x 7' PT present to monitor Supine heel slides 2x10 (ROM knee flexion measures 108 today) Lt SLR 2x10  Supine bridge 2x10 SL Lt hip abd 2x10 and clam 2x10 3' walk test with 2-wheel walker: 353' TUG: 11 sec no AD, 16 sec with 2-wheel walker LAQ 10x5" Standing bil heel raises x 20 Supine hamstring curl feet on red physioball x 15 Added SL hip abd and standing bil heel raise to HEP  EVAL: 09/01/2022:  Reviewed HEP     PATIENT EDUCATION:  Education details: Issued HEP Person educated: Patient Education method: Explanation, Demonstration, and Handouts Education comprehension: verbalized understanding     HOME EXERCISE PROGRAM: Access Code: [email or web address] URL: [email or web address] Date: 09/08/2022 Prepared by: Venetia Night Beuhring  Exercises - Sit to Stand with Armchair  - 1 x daily - 7 x weekly - 2 sets - 10 reps - Supine Bridge  - 1 x daily - 7 x weekly - 2 sets - 10 reps - Supine Heel Slide  - 1 x daily - 7 x weekly - 2 sets - 10 reps - Supine Active Straight Leg Raise  - 1 x daily - 7 x weekly - 2 sets - 10 reps - Clamshell  - 1 x daily - 7 x weekly - 2 sets - 10 reps - Sidelying Hip Abduction  - 1 x daily - 7 x weekly - 2 sets - 10 reps - Standing Heel Raise  - 1 x daily - 7 x weekly - 2 sets - 20 reps   ASSESSMENT:   CLINICAL IMPRESSION: Patient arrives wearing small velcro knee brace and ambulating without AD with much improved WB into Lt LE.  Lt knee flexion is improved to 108 deg today.  He was able to participate in 3' walk test with 2-wheel walker covering 353' today and TUG with and without 2-wheel walker.  He notes Lt knee pain only when turning corners or changing directions.  He will be ready for addition of ankle weights/resistance next visit for unweighted therex.   Pt does have ongoing back pain which prevents him from sitting and standing fully upright consistently (he sustained spinal compression fractures with his original fall).       OBJECTIVE IMPAIRMENTS decreased activity tolerance, decreased balance, difficulty walking, decreased ROM, decreased strength, increased edema, increased fascial restrictions, increased muscle spasms, impaired flexibility, and pain.    ACTIVITY LIMITATIONS carrying, lifting, bending, standing, squatting, and stairs   PARTICIPATION LIMITATIONS: cleaning, laundry, community activity, and occupation   PERSONAL FACTORS Past/current experiences, Time since onset of injury/illness/exacerbation, and 3+ comorbidities: multiple surgical interventions required following fall  are also affecting patient's functional outcome.    REHAB POTENTIAL: Good   CLINICAL DECISION MAKING: Evolving/moderate complexity   EVALUATION COMPLEXITY: Moderate     GOALS: Goals reviewed with patient? Yes   SHORT TERM GOALS: Target date: 09/22/2022  Pt will be independent with initial HEP. Baseline: Goal status: ongoing   2.  Pt will be able to participate in a TUG assessment with RW. Baseline:  Goal status: met both with and without AD     LONG TERM GOALS: Target date: 10/28/2022    Pt will be independent with advanced HEP. Baseline:  Goal status: INITIAL   2.  Pt will report no increase in pain with ambulation with LRAD when walking around the grocery store. Baseline:  Goal status: INITIAL   3.  Pt will increase left knee A/ROM to 0 to 120 degrees to allow for easier navigation of stairs.  Baseline:  Goal status: INITIAL   4.  Pt will increase left hip and hamstring strength to at least 5-/5 to allow him to navigate stairs with reciprocal pattern. Baseline:  Goal status: INITIAL   5.  Pt will increase FOTO to at least 56% to demonstrate improvements in functional tasks. Baseline: 26% Goal status: INITIAL   6.  Pt will be  able to ambulate for at least 20 minutes with LRAD to allow him to go on easier hikes. Baseline:  Goal status: INITIAL     PLAN: PT FREQUENCY: 2x/week   PT DURATION: 8 weeks   PLANNED INTERVENTIONS: Therapeutic exercises, Therapeutic activity, Neuromuscular re-education, Balance training, Gait training, Patient/Family education, Self Care, Joint mobilization, Joint manipulation, Stair training, Aquatic Therapy, Dry Needling, Electrical stimulation, Spinal manipulation, Spinal mobilization, Cryotherapy, Moist heat, scar mobilization, Taping, Vasopneumatic device, Ionotophoresis 52m/ml Dexamethasone, Manual therapy, and Re-evaluation   PLAN FOR NEXT SESSION: assess and progress HEP as indicated (add ankle weights and band for clam to mat therex next time), strengthening, flexibility,  continue gait training with and without AD, needs supervision for safety   SRudi HeapPT, DPT 09/12/22  1:37 PM

## 2022-09-13 ENCOUNTER — Encounter: Payer: Self-pay | Admitting: Physical Therapy

## 2022-09-13 ENCOUNTER — Ambulatory Visit: Payer: BC Managed Care – PPO | Admitting: Physical Therapy

## 2022-09-13 DIAGNOSIS — M6281 Muscle weakness (generalized): Secondary | ICD-10-CM

## 2022-09-13 DIAGNOSIS — M79605 Pain in left leg: Secondary | ICD-10-CM

## 2022-09-13 DIAGNOSIS — M5459 Other low back pain: Secondary | ICD-10-CM

## 2022-09-13 DIAGNOSIS — R262 Difficulty in walking, not elsewhere classified: Secondary | ICD-10-CM

## 2022-09-13 NOTE — Therapy (Signed)
OUTPATIENT PHYSICAL THERAPY TREATMENT NOTE   Patient Name: Ryan Lynch MRN: [id number] DOB:Dec 24, 1980, 41 y.o., male Today's Date: 09/13/2022    END OF SESSION:   PT End of Session - 09/13/22 1451     Visit Number 3    Date for PT Re-Evaluation 10/28/22    Authorization Type BC/BS    PT Start Time 1446    PT Stop Time 1527    PT Time Calculation (min) 41 min    Activity Tolerance Patient tolerated treatment well;No increased pain    Behavior During Therapy [facility] for tasks assessed/performed             History reviewed. No pertinent past medical history. Past Surgical History:  Procedure Laterality Date   ANTERIOR CRUCIATE LIGAMENT REPAIR Right    APPLICATION OF WOUND VAC Left 07/02/2022   Procedure: APPLICATION OF WOUND VAC;  Surgeon: Altamese [place]tersburg, MD;  Location: [place];  Service: Orthopedics;  Laterality: Left;   APPLICATION OF WOUND VAC Left 07/05/2022   Procedure: APPLICATION OF WOUND VAC;  Surgeon: Altamese Daggett, MD;  Location: [place];  Service: Orthopedics;  Laterality: Left;   CLOSED REDUCTION TIBIA Left 07/02/2022   Procedure: CLOSED REDUCTION TIBIA;  Surgeon: Altamese [place]yard, MD;  Location: [place];  Service: Orthopedics;  Laterality: Left;   EXTERNAL FIXATION LEG Left 07/02/2022   Procedure: EXTERNAL FIXATION LEG;  Surgeon: Altamese [place], MD;  Location: [place];  Service: Orthopedics;  Laterality: Left;   FASCIOTOMY Left 07/02/2022   Procedure: FASCIOTOMY;  Surgeon: Altamese [place], MD;  Location: [place];  Service: Orthopedics;  Laterality: Left;   I & D EXTREMITY Left 07/07/2022   Procedure: IRRIGATION AND DEBRIDEMENT EXTREMITY;  Surgeon: Altamese [place], MD;  Location: [place];  Service: Orthopedics;  Laterality: Left;   ORIF TIBIA PLATEAU Left 07/05/2022   Procedure: OPEN REDUCTION INTERNAL FIXATION (ORIF) TIBIAL PLATEAU;  Surgeon: Altamese [place], MD;  Location: [place];  Service: Orthopedics;  Laterality: Left;   SECONDARY CLOSURE OF WOUND Left 07/05/2022   Procedure:  SECONDARY CLOSURE OF WOUND;  Surgeon: Altamese [place], MD;  Location: [place];  Service: Orthopedics;  Laterality: Left;   SECONDARY CLOSURE OF WOUND Left 07/07/2022   Procedure: SECONDARY CLOSURE OF WOUND;  Surgeon: Altamese , MD;  Location: [place];  Service: Orthopedics;  Laterality: Left;   SHOULDER SURGERY Bilateral    Patient Active Problem List   Diagnosis Date Noted   Generalized anxiety disorder    Closed nondisplaced fracture of left tibial plateau 07/16/2022   Multiple rib fractures 07/11/2022   Compression fracture of body of thoracic vertebra ([place]) 07/11/2022   Sepsis ([place]) 07/09/2022   Acute urinary retention 07/09/2022   Prolonged QT interval 07/09/2022   AKI (acute kidney injury) ([place]) 07/07/2022   Alcohol withdrawal ([place]) 07/05/2022   Acute anemia 07/05/2022   Psoriasis 07/05/2022   Ileus ([place]) 55/73/2202   Acute metabolic encephalopathy 54/27/0623   Traumatic rhabdomyolysis ([place]) 07/03/2022   Observation after surgery 07/02/2022   Tibial plateau fracture, left 07/02/2022   Alcohol use 07/02/2022   Transaminitis 07/02/2022   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[place]) 07/02/2022   Adrenal nodule ([place]) 07/02/2022   Compression fracture of T7 vertebra ([place]) 07/02/2022   Thrombocytopenia ([place]) 07/02/2022   Anxiety 07/02/2022    REFERRING DIAG: J62.831D (ICD-10-CM) - Bicondylar fracture of left tibia Z98.890,Z87.81 (ICD-10-CM) - Status post ORIF of fracture of ankle T79.A29A (ICD-10-CM) - Compartment syndrome of lower leg ([place])    ONSET DATE: 07/02/2022    THERAPY DIAG:  Muscle weakness (generalized)   Difficulty in walking, not elsewhere classified   Other low back pain   Pain in left leg   Rationale for Evaluation and Treatment Rehabilitation   PERTINENT HISTORY:  [place] down approx 8 steps on 07/02/2022 and sustained T7 mild anterior vertebral body compression fracture and a left tibial plateau comminuted fracture requiring surgical intervention.  On 07/02/2022, pt  underwent closed reduction of the left tibial plateau with external fixator placed as well as 4 compartment fasciotomy of left leg.  On 07/05/22 Pt underwent left tibial plateau ORIF.  Both surgeries performed by Dr Marcelino Scot.   NWB initially, now WBAT with no restrictions listed   PRECAUTIONS: WBAT LLE, FALLS   SUBJECTIVE:                                                                                                                                                                                       SUBJECTIVE STATEMENT:  Pt states things are going well. No pain at the moment.      PAIN:  Are you having pain? Yes: NPRS scale: 3-8/10 Pain location: Left LE Pain description: aching at rest, sharp with use/walking Aggravating factors: standing and walking Relieving factors: rest       OBJECTIVE: (objective measures completed at initial evaluation unless otherwise dated)     DIAGNOSTIC FINDINGS:  07/22/22 Left Knee Radiograph: FINDINGS: Stable changes of plate and screw fixation of the tibial plateau. Mild residual distraction of sub chondral fragments without seen step-off deformity. No new fracture or dislocation. Small effusion in the suprapatellar bursa   CT of the chest abdomen pelvis showed a T7 mild anterior vertebral body compression fracture with approximately 20% height loss.   PATIENT SURVEYS:  Eval:  FOTO 26% (estimated 56% by visit 19)   COGNITION:           Overall cognitive status:  Pt reports that his memory is getting more back to baseline and he is more alert now                                SENSATION: Reports numbness on bilateral LE   EDEMA:  Edema noted on LLE   MUSCLE LENGTH: Hamstring tightness noted   POSTURE: rounded shoulders and forward head   PALPATION: Mild tenderness to palpation along left lower leg   LOWER EXTREMITY ROM:  09/13/22: Lt knee flexion 120 deg                          09/08/22: Lt knee flexion 108 deg 09/01/2022:  Right  knee is 0-120, Left knee is 0 to 100 degrees   LOWER EXTREMITY MMT:   Left hip and hamstring strength is 4 to 4+/5 Right hip and knee are 5/5   FUNCTIONAL TESTS:  Eval: 5 times sit to stand: 17.7 sec with UE use   09/08/22:  TUG: 16" with 2-wheel walker; TUG: 11 sec no AD, close supervision 3' walk test with 2-wheel walker: 353'    GAIT: Distance walked: 50 ft Assistive device utilized: Environmental consultant - 2 wheeled Level of assistance: SBA Comments: Antalgic gait pattern with decreased stance time through LLE       TODAY'S TREATMENT: 09/13/22: Gait training: stepping over hurdles fwd/side Standing hip flexion with UE slide at wall Sidelying Lt hip abd #2.5 2x10 reps Prone Lt hip ext #2.5 2x10 reps  Supine hip extension on green physioball x10 reps Supine hamstring curl with green phsioball x10 reps  Seated Lt tibial IR/ER x10 reps without pain  Heel raises weight shifted Lt, PT tactile cues x15 reps Step down lateral 4", 2 UE support, 2x6 reps  Step up forward 4" 1 UE support x15 reps Lt SLS with Rt LE prop on half foam roll: green plyo toss x20 reps Supine Lt SLR x20 reps   09/08/22: Pt with ind ambulation without AD from waiting room to NuStep NuStep L1 x 7' PT present to monitor Supine heel slides 2x10 (ROM knee flexion measures 108 today) Lt SLR 2x10  Supine bridge 2x10 SL Lt hip abd 2x10 and clam 2x10 3' walk test with 2-wheel walker: 353' TUG: 11 sec no AD, 16 sec with 2-wheel walker LAQ 10x5" Standing bil heel raises x 20 Supine hamstring curl feet on red physioball x 15 Added SL hip abd and standing bil heel raise to HEP   EVAL: 09/01/2022:  Reviewed HEP     PATIENT EDUCATION:  Education details: Issued HEP Person educated: Patient Education method: Explanation, Demonstration, and Handouts Education comprehension: verbalized understanding     HOME EXERCISE PROGRAM: Access Code: [email or web address] URL: [email or web address] Date: 09/08/2022 Prepared  by: Venetia Night Beuhring   Exercises - Sit to Stand with Armchair  - 1 x daily - 7 x weekly - 2 sets - 10 reps - Supine Bridge  - 1 x daily - 7 x weekly - 2 sets - 10 reps - Supine Heel Slide  - 1 x daily - 7 x weekly - 2 sets - 10 reps - Supine Active Straight Leg Raise  - 1 x daily - 7 x weekly - 2 sets - 10 reps - Clamshell  - 1 x daily - 7 x weekly - 2 sets - 10 reps - Sidelying Hip Abduction  - 1 x daily - 7 x weekly - 2 sets - 10 reps - Standing Heel Raise  - 1 x daily - 7 x weekly - 2 sets - 20 reps   ASSESSMENT:   CLINICAL IMPRESSION: Pt is making steady progress towards his goals, with his ROM up to 120 deg today. Introduced ankle weights and other weight bearing exercises to promote LE strength. Pt was unable to complete single leg heel raise on the Lt secondary to significant gastroc weakness. He demonstrated proper form with step ups and down from 4" box, and there was muscle fatigue and shaking noted. Ended without increase in Lt knee pain.      OBJECTIVE IMPAIRMENTS decreased activity tolerance, decreased balance, difficulty walking, decreased ROM, decreased strength, increased edema, increased fascial restrictions, increased muscle spasms, impaired  flexibility, and pain.    ACTIVITY LIMITATIONS carrying, lifting, bending, standing, squatting, and stairs   PARTICIPATION LIMITATIONS: cleaning, laundry, community activity, and occupation   PERSONAL FACTORS Past/current experiences, Time since onset of injury/illness/exacerbation, and 3+ comorbidities: multiple surgical interventions required following fall  are also affecting patient's functional outcome.    REHAB POTENTIAL: Good   CLINICAL DECISION MAKING: Evolving/moderate complexity   EVALUATION COMPLEXITY: Moderate     GOALS: Goals reviewed with patient? Yes   SHORT TERM GOALS: Target date: 09/22/2022  Pt will be independent with initial HEP. Baseline: Goal status: ongoing   2.  Pt will be able to participate in a  TUG assessment with RW. Baseline:  Goal status: met both with and without AD     LONG TERM GOALS: Target date: 10/28/2022    Pt will be independent with advanced HEP. Baseline:  Goal status: INITIAL   2.  Pt will report no increase in pain with ambulation with LRAD when walking around the grocery store. Baseline:  Goal status: INITIAL   3.  Pt will increase left knee A/ROM to 0 to 120 degrees to allow for easier navigation of stairs. Baseline:  Goal status: INITIAL   4.  Pt will increase left hip and hamstring strength to at least 5-/5 to allow him to navigate stairs with reciprocal pattern. Baseline:  Goal status: INITIAL   5.  Pt will increase FOTO to at least 56% to demonstrate improvements in functional tasks. Baseline: 26% Goal status: INITIAL   6.  Pt will be able to ambulate for at least 20 minutes with LRAD to allow him to go on easier hikes. Baseline:  Goal status: INITIAL     PLAN: PT FREQUENCY: 2x/week   PT DURATION: 8 weeks   PLANNED INTERVENTIONS: Therapeutic exercises, Therapeutic activity, Neuromuscular re-education, Balance training, Gait training, Patient/Family education, Self Care, Joint mobilization, Joint manipulation, Stair training, Aquatic Therapy, Dry Needling, Electrical stimulation, Spinal manipulation, Spinal mobilization, Cryotherapy, Moist heat, scar mobilization, Taping, Vasopneumatic device, Ionotophoresis 60m/ml Dexamethasone, Manual therapy, and Re-evaluation   PLAN FOR NEXT SESSION: assess and progress HEP as indicated (add ankle weights and band for clam to mat therex next time), strengthening, flexibility,  continue gait training with and without AD, needs supervision for safety     SSherol Dade PT 09/13/2022, 3:30 PM

## 2022-09-14 DIAGNOSIS — S82112D Displaced fracture of left tibial spine, subsequent encounter for closed fracture with routine healing: Secondary | ICD-10-CM | POA: Diagnosis not present

## 2022-09-14 DIAGNOSIS — S82142D Displaced bicondylar fracture of left tibia, subsequent encounter for closed fracture with routine healing: Secondary | ICD-10-CM | POA: Diagnosis not present

## 2022-09-14 DIAGNOSIS — T79A22D Traumatic compartment syndrome of left lower extremity, subsequent encounter: Secondary | ICD-10-CM | POA: Diagnosis not present

## 2022-09-15 ENCOUNTER — Ambulatory Visit: Payer: BC Managed Care – PPO | Attending: Orthopedic Surgery | Admitting: Physical Therapy

## 2022-09-15 DIAGNOSIS — M6281 Muscle weakness (generalized): Secondary | ICD-10-CM

## 2022-09-15 DIAGNOSIS — M5459 Other low back pain: Secondary | ICD-10-CM | POA: Diagnosis not present

## 2022-09-15 DIAGNOSIS — R262 Difficulty in walking, not elsewhere classified: Secondary | ICD-10-CM | POA: Diagnosis not present

## 2022-09-15 DIAGNOSIS — M79605 Pain in left leg: Secondary | ICD-10-CM | POA: Diagnosis not present

## 2022-09-15 NOTE — Therapy (Signed)
OUTPATIENT PHYSICAL THERAPY TREATMENT NOTE   Patient Name: Ryan Lynch MRN: [id number] DOB:Jul 26, 1981, 41 y.o., male Today's Date: 09/15/2022    END OF SESSION:   PT End of Session - 09/15/22 1353     Visit Number 4    Date for PT Re-Evaluation 10/28/22    Authorization Type BC/BS    PT Start Time 1230    PT Stop Time 1310    PT Time Calculation (min) 40 min    Activity Tolerance Patient tolerated treatment well    Behavior During Therapy [facility] for tasks assessed/performed              No past medical history on file. Past Surgical History:  Procedure Laterality Date   ANTERIOR CRUCIATE LIGAMENT REPAIR Right    APPLICATION OF WOUND VAC Left 07/02/2022   Procedure: APPLICATION OF WOUND VAC;  Surgeon: Altamese [place], MD;  Location: [place];  Service: Orthopedics;  Laterality: Left;   APPLICATION OF WOUND VAC Left 07/05/2022   Procedure: APPLICATION OF WOUND VAC;  Surgeon: Altamese [place], MD;  Location: [place];  Service: Orthopedics;  Laterality: Left;   CLOSED REDUCTION TIBIA Left 07/02/2022   Procedure: CLOSED REDUCTION TIBIA;  Surgeon: Altamese [place], MD;  Location: [place];  Service: Orthopedics;  Laterality: Left;   EXTERNAL FIXATION LEG Left 07/02/2022   Procedure: EXTERNAL FIXATION LEG;  Surgeon: Altamese [place], MD;  Location: [place];  Service: Orthopedics;  Laterality: Left;   FASCIOTOMY Left 07/02/2022   Procedure: FASCIOTOMY;  Surgeon: Altamese [place], MD;  Location: [place];  Service: Orthopedics;  Laterality: Left;   I & D EXTREMITY Left 07/07/2022   Procedure: IRRIGATION AND DEBRIDEMENT EXTREMITY;  Surgeon: Altamese [place], MD;  Location: [place];  Service: Orthopedics;  Laterality: Left;   ORIF TIBIA PLATEAU Left 07/05/2022   Procedure: OPEN REDUCTION INTERNAL FIXATION (ORIF) TIBIAL PLATEAU;  Surgeon: Altamese [place], MD;  Location: [place];  Service: Orthopedics;  Laterality: Left;   SECONDARY CLOSURE OF WOUND Left 07/05/2022   Procedure: SECONDARY CLOSURE OF WOUND;  Surgeon: Altamese [place], MD;  Location: [place];  Service: Orthopedics;  Laterality: Left;   SECONDARY CLOSURE OF WOUND Left 07/07/2022   Procedure: SECONDARY CLOSURE OF WOUND;  Surgeon: Altamese , MD;  Location: [place];  Service: Orthopedics;  Laterality: Left;   SHOULDER SURGERY Bilateral    Patient Active Problem List   Diagnosis Date Noted   Generalized anxiety disorder    Closed nondisplaced fracture of left tibial plateau 07/16/2022   Multiple rib fractures 07/11/2022   Compression fracture of body of thoracic vertebra ([place]) 07/11/2022   Sepsis ([place]) 07/09/2022   Acute urinary retention 07/09/2022   Prolonged QT interval 07/09/2022   AKI (acute kidney injury) ([place]) 07/07/2022   Alcohol withdrawal ([place]) 07/05/2022   Acute anemia 07/05/2022   Psoriasis 07/05/2022   Ileus ([place]) 54/98/2641   Acute metabolic encephalopathy 58/30/9407   Traumatic rhabdomyolysis ([place]) 07/03/2022   Observation after surgery 07/02/2022   Tibial plateau fracture, left 07/02/2022   Alcohol use 07/02/2022   Transaminitis 07/02/2022   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[place]) 07/02/2022   Adrenal nodule ([place]) 07/02/2022   Compression fracture of T7 vertebra ([place]) 07/02/2022   Thrombocytopenia ([place]) 07/02/2022   Anxiety 07/02/2022    REFERRING DIAG: W80.881J (ICD-10-CM) - Bicondylar fracture of left tibia Z98.890,Z87.81 (ICD-10-CM) - Status post ORIF of fracture of ankle T79.A29A (ICD-10-CM) - Compartment syndrome of lower leg ([place])    ONSET DATE: 07/02/2022    THERAPY DIAG:  Muscle  weakness (generalized)   Difficulty in walking, not elsewhere classified   Other low back pain   Pain in left leg   Rationale for Evaluation and Treatment Rehabilitation   PERTINENT HISTORY:  [place] [address] down approx 8 steps on 07/02/2022 and sustained T7 mild anterior vertebral body compression fracture and a left tibial plateau comminuted fracture requiring surgical intervention.  On 07/02/2022, pt underwent closed reduction of the left tibial  plateau with external fixator placed as well as 4 compartment fasciotomy of left leg.  On 07/05/22 Pt underwent left tibial plateau ORIF.  Both surgeries performed by Dr Marcelino Scot.   NWB initially, now WBAT with no restrictions listed   PRECAUTIONS: WBAT LLE, FALLS   SUBJECTIVE:                                                                                                                                                                                       SUBJECTIVE STATEMENT:  Pt states things are going well. He is very tired today as he just woke up from a nap.      PAIN:  Are you having pain? Yes: NPRS scale: 3-8/10 Pain location: Left LE Pain description: aching at rest, sharp with use/walking Aggravating factors: standing and walking Relieving factors: rest       OBJECTIVE: (objective measures completed at initial evaluation unless otherwise dated)     DIAGNOSTIC FINDINGS:  07/22/22 Left Knee Radiograph: FINDINGS: Stable changes of plate and screw fixation of the tibial plateau. Mild residual distraction of sub chondral fragments without seen step-off deformity. No new fracture or dislocation. Small effusion in the suprapatellar bursa   CT of the chest abdomen pelvis showed a T7 mild anterior vertebral body compression fracture with approximately 20% height loss.   PATIENT SURVEYS:  Eval:  FOTO 26% (estimated 56% by visit 19)   COGNITION:           Overall cognitive status:  Pt reports that his memory is getting more back to baseline and he is more alert now                                SENSATION: Reports numbness on bilateral LE   EDEMA:  Edema noted on LLE   MUSCLE LENGTH: Hamstring tightness noted   POSTURE: rounded shoulders and forward head   PALPATION: Mild tenderness to palpation along left lower leg   LOWER EXTREMITY ROM:  09/13/22: Lt knee flexion 120 deg  09/08/22: Lt knee flexion 108 deg 09/01/2022:  Right knee is 0-120,  Left knee is 0 to 100 degrees   LOWER EXTREMITY MMT:   Left hip and hamstring strength is 4 to 4+/5 Right hip and knee are 5/5   FUNCTIONAL TESTS:  Eval: 5 times sit to stand: 17.7 sec with UE use   09/08/22:  TUG: 16" with 2-wheel walker; TUG: 11 sec no AD, close supervision 3' walk test with 2-wheel walker: 353'    GAIT: Distance walked: 50 ft Assistive device utilized: Walker - 2 wheeled Level of assistance: SBA Comments: Antalgic gait pattern with decreased stance time through LLE       TODAY'S TREATMENT: 09/15/2022:   Recumbent bike 4 min, lvl 1. Gait training: stepping over hurdles fwd/side Standing hip flexion with UE slide at wall Sidelying Lt hip abd #2.5 2x10 reps Prone Lt hip ext #2.5 2x10 reps  Leg press with L LE only, 2x10   Seated Lt tibial IR/ER x10 reps without pain  Heel raises weight shifted Lt, PT tactile cues x15 reps Step up lateral 4", 1UE support, 2x10 reps  Step up forward 4" 1 UE support 2x10 reps Supine Lt SLR x20 reps   09/13/22: Gait training: stepping over hurdles fwd/side Standing hip flexion with UE slide at wall Sidelying Lt hip abd #2.5 2x10 reps Prone Lt hip ext #2.5 2x10 reps  Supine hip extension on green physioball x10 reps Supine hamstring curl with green phsioball x10 reps  Seated Lt tibial IR/ER x10 reps without pain  Heel raises weight shifted Lt, PT tactile cues x15 reps Step down lateral 4", 2 UE support, 2x6 reps  Step up forward 4" 1 UE support x15 reps Lt SLS with Rt LE prop on half foam roll: green plyo toss x20 reps Supine Lt SLR x20 reps   09/08/22: Pt with ind ambulation without AD from waiting room to NuStep NuStep L1 x 7' PT present to monitor Supine heel slides 2x10 (ROM knee flexion measures 108 today) Lt SLR 2x10  Supine bridge 2x10 SL Lt hip abd 2x10 and clam 2x10 3' walk test with 2-wheel walker: 353' TUG: 11 sec no AD, 16 sec with 2-wheel walker LAQ 10x5" Standing bil heel raises x 20 Supine  hamstring curl feet on red physioball x 15 Added SL hip abd and standing bil heel raise to HEP   EVAL: 09/01/2022:  Reviewed HEP     PATIENT EDUCATION:  Education details: Issued HEP Person educated: Patient Education method: Explanation, Demonstration, and Handouts Education comprehension: verbalized understanding     HOME EXERCISE PROGRAM: Access Code: [email or web address] URL: [email or web address] Date: 09/08/2022 Prepared by: Venetia Night Beuhring   Exercises - Sit to Stand with Armchair  - 1 x daily - 7 x weekly - 2 sets - 10 reps - Supine Bridge  - 1 x daily - 7 x weekly - 2 sets - 10 reps - Supine Heel Slide  - 1 x daily - 7 x weekly - 2 sets - 10 reps - Supine Active Straight Leg Raise  - 1 x daily - 7 x weekly - 2 sets - 10 reps - Clamshell  - 1 x daily - 7 x weekly - 2 sets - 10 reps - Sidelying Hip Abduction  - 1 x daily - 7 x weekly - 2 sets - 10 reps - Standing Heel Raise  - 1 x daily - 7 x weekly - 2 sets - 20 reps   ASSESSMENT:   CLINICAL IMPRESSION:  Pt is making steady progress towards his goals. Challenged pt with single leg press today with noted fatigue. He continues to demonstrate difficulty with calf raises on L LE. No reported pain with step up's today. He states that last session it really tired him out and caused some residual pain. Visible shaking with end of session SLR today. Pt requires cues for proper forum throughout session.    OBJECTIVE IMPAIRMENTS decreased activity tolerance, decreased balance, difficulty walking, decreased ROM, decreased strength, increased edema, increased fascial restrictions, increased muscle spasms, impaired flexibility, and pain.    ACTIVITY LIMITATIONS carrying, lifting, bending, standing, squatting, and stairs   PARTICIPATION LIMITATIONS: cleaning, laundry, community activity, and occupation   PERSONAL FACTORS Past/current experiences, Time since onset of injury/illness/exacerbation, and 3+ comorbidities: multiple  surgical interventions required following fall  are also affecting patient's functional outcome.    REHAB POTENTIAL: Good   CLINICAL DECISION MAKING: Evolving/moderate complexity   EVALUATION COMPLEXITY: Moderate     GOALS: Goals reviewed with patient? Yes   SHORT TERM GOALS: Target date: 09/22/2022  Pt will be independent with initial HEP. Baseline: Goal status: ongoing   2.  Pt will be able to participate in a TUG assessment with RW. Baseline:  Goal status: met both with and without AD     LONG TERM GOALS: Target date: 10/28/2022    Pt will be independent with advanced HEP. Baseline:  Goal status: INITIAL   2.  Pt will report no increase in pain with ambulation with LRAD when walking around the grocery store. Baseline:  Goal status: INITIAL   3.  Pt will increase left knee A/ROM to 0 to 120 degrees to allow for easier navigation of stairs. Baseline:  Goal status: INITIAL   4.  Pt will increase left hip and hamstring strength to at least 5-/5 to allow him to navigate stairs with reciprocal pattern. Baseline:  Goal status: INITIAL   5.  Pt will increase FOTO to at least 56% to demonstrate improvements in functional tasks. Baseline: 26% Goal status: INITIAL   6.  Pt will be able to ambulate for at least 20 minutes with LRAD to allow him to go on easier hikes. Baseline:  Goal status: INITIAL     PLAN: PT FREQUENCY: 2x/week   PT DURATION: 8 weeks   PLANNED INTERVENTIONS: Therapeutic exercises, Therapeutic activity, Neuromuscular re-education, Balance training, Gait training, Patient/Family education, Self Care, Joint mobilization, Joint manipulation, Stair training, Aquatic Therapy, Dry Needling, Electrical stimulation, Spinal manipulation, Spinal mobilization, Cryotherapy, Moist heat, scar mobilization, Taping, Vasopneumatic device, Ionotophoresis 24m/ml Dexamethasone, Manual therapy, and Re-evaluation   PLAN FOR NEXT SESSION: assess and progress HEP as indicated  (add ankle weights and band for clam to mat therex next time), strengthening, flexibility,  continue gait training with and without AD, needs supervision for safety     SLynden Ang PT 09/15/2022, 1:54 PM

## 2022-09-20 ENCOUNTER — Ambulatory Visit: Payer: BC Managed Care – PPO | Admitting: Physical Therapy

## 2022-09-20 DIAGNOSIS — S22060A Wedge compression fracture of T7-T8 vertebra, initial encounter for closed fracture: Secondary | ICD-10-CM | POA: Diagnosis not present

## 2022-09-20 DIAGNOSIS — G9341 Metabolic encephalopathy: Secondary | ICD-10-CM | POA: Diagnosis not present

## 2022-09-20 DIAGNOSIS — S82145A Nondisplaced bicondylar fracture of left tibia, initial encounter for closed fracture: Secondary | ICD-10-CM | POA: Diagnosis not present

## 2022-09-20 DIAGNOSIS — T79A0XA Compartment syndrome, unspecified, initial encounter: Secondary | ICD-10-CM | POA: Diagnosis not present

## 2022-09-22 ENCOUNTER — Ambulatory Visit: Payer: BC Managed Care – PPO | Admitting: Physical Therapy

## 2022-09-27 ENCOUNTER — Encounter: Payer: BC Managed Care – PPO | Admitting: Physical Therapy

## 2022-09-29 ENCOUNTER — Encounter: Payer: BC Managed Care – PPO | Admitting: Physical Therapy

## 2022-10-04 ENCOUNTER — Ambulatory Visit: Payer: BC Managed Care – PPO | Admitting: Physical Therapy

## 2022-10-04 ENCOUNTER — Encounter: Payer: Self-pay | Admitting: Physical Therapy

## 2022-10-04 DIAGNOSIS — R262 Difficulty in walking, not elsewhere classified: Secondary | ICD-10-CM

## 2022-10-04 DIAGNOSIS — M5459 Other low back pain: Secondary | ICD-10-CM

## 2022-10-04 DIAGNOSIS — M79605 Pain in left leg: Secondary | ICD-10-CM

## 2022-10-04 DIAGNOSIS — M6281 Muscle weakness (generalized): Secondary | ICD-10-CM

## 2022-10-04 NOTE — Therapy (Signed)
OUTPATIENT PHYSICAL THERAPY TREATMENT NOTE   Patient Name: Ryan Lynch MRN: [id number] DOB:1981-03-17, 41 y.o., male Today's Date: 10/04/2022    END OF SESSION:   PT End of Session - 10/04/22 1148     Visit Number 5    Date for PT Re-Evaluation 10/28/22    Authorization Type BC/BS    PT Start Time 1145    PT Stop Time 1225    PT Time Calculation (min) 40 min    Activity Tolerance Patient tolerated treatment well    Behavior During Therapy [facility] for tasks assessed/performed               History reviewed. No pertinent past medical history. Past Surgical History:  Procedure Laterality Date   ANTERIOR CRUCIATE LIGAMENT REPAIR Right    APPLICATION OF WOUND VAC Left 07/02/2022   Procedure: APPLICATION OF WOUND VAC;  Surgeon: Myrene Galas, MD;  Location: [facility] OR;  Service: Orthopedics;  Laterality: Left;   APPLICATION OF WOUND VAC Left 07/05/2022   Procedure: APPLICATION OF WOUND VAC;  Surgeon: Myrene Galas, MD;  Location: [facility] OR;  Service: Orthopedics;  Laterality: Left;   CLOSED REDUCTION TIBIA Left 07/02/2022   Procedure: CLOSED REDUCTION TIBIA;  Surgeon: Myrene Galas, MD;  Location: [facility] OR;  Service: Orthopedics;  Laterality: Left;   EXTERNAL FIXATION LEG Left 07/02/2022   Procedure: EXTERNAL FIXATION LEG;  Surgeon: Myrene Galas, MD;  Location: [facility] OR;  Service: Orthopedics;  Laterality: Left;   FASCIOTOMY Left 07/02/2022   Procedure: FASCIOTOMY;  Surgeon: Myrene Galas, MD;  Location: [facility] OR;  Service: Orthopedics;  Laterality: Left;   I & D EXTREMITY Left 07/07/2022   Procedure: IRRIGATION AND DEBRIDEMENT EXTREMITY;  Surgeon: Myrene Galas, MD;  Location: [facility] OR;  Service: Orthopedics;  Laterality: Left;   ORIF TIBIA PLATEAU Left 07/05/2022   Procedure: OPEN REDUCTION INTERNAL FIXATION (ORIF) TIBIAL PLATEAU;  Surgeon: Myrene Galas, MD;  Location: [facility] OR;  Service: Orthopedics;  Laterality: Left;   SECONDARY CLOSURE OF WOUND Left 07/05/2022   Procedure: SECONDARY CLOSURE OF  WOUND;  Surgeon: Myrene Galas, MD;  Location: [facility] OR;  Service: Orthopedics;  Laterality: Left;   SECONDARY CLOSURE OF WOUND Left 07/07/2022   Procedure: SECONDARY CLOSURE OF WOUND;  Surgeon: Myrene Galas, MD;  Location: [facility] OR;  Service: Orthopedics;  Laterality: Left;   SHOULDER SURGERY Bilateral    Patient Active Problem List   Diagnosis Date Noted   Generalized anxiety disorder    Closed nondisplaced fracture of left tibial plateau 07/16/2022   Multiple rib fractures 07/11/2022   Compression fracture of body of thoracic vertebra ([name]) 07/11/2022   Sepsis ([name]) 07/09/2022   Acute urinary retention 07/09/2022   Prolonged QT interval 07/09/2022   AKI (acute kidney injury) ([name]) 07/07/2022   Alcohol withdrawal ([name]) 07/05/2022   Acute anemia 07/05/2022   Psoriasis 07/05/2022   Ileus ([name]) 07/04/2022   Acute metabolic encephalopathy 07/04/2022   Traumatic rhabdomyolysis ([name]) 07/03/2022   Observation after surgery 07/02/2022   Tibial plateau fracture, left 07/02/2022   Alcohol use 07/02/2022   Transaminitis 07/02/2022   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[name]) 07/02/2022   Adrenal nodule ([name]) 07/02/2022   Compression fracture of T7 vertebra ([name]) 07/02/2022   Thrombocytopenia ([name]) 07/02/2022   Anxiety 07/02/2022    REFERRING DIAG: W09.811B (ICD-10-CM) - Bicondylar fracture of left tibia Z98.890,Z87.81 (ICD-10-CM) - Status post ORIF of fracture of ankle T79.A29A (ICD-10-CM) - Compartment syndrome of lower leg ([name])    ONSET DATE: 07/02/2022    THERAPY DIAG:  Muscle weakness (generalized)   Difficulty in walking, not elsewhere classified   Other low back pain   Pain in left leg   Rationale for Evaluation and Treatment Rehabilitation   PERTINENT HISTORY:  Larey Seat down approx 8 steps on 07/02/2022 and sustained T7 mild anterior vertebral body compression fracture and a left tibial plateau comminuted fracture requiring surgical intervention.  On 07/02/2022, pt underwent closed  reduction of the left tibial plateau with external fixator placed as well as 4 compartment fasciotomy of left leg.  On 07/05/22 Pt underwent left tibial plateau ORIF.  Both surgeries performed by Dr Carola Frost.   NWB initially, now WBAT with no restrictions listed   PRECAUTIONS: WBAT LLE, FALLS   SUBJECTIVE:                                                                                                                                                                                       SUBJECTIVE STATEMENT:  I missed a few weeks b/c I wanted to take time to get off pain meds and clear my head.  I am only taking ibuprofen now, as needed.  I am not using any AD anymore.  Pain ranges from 2-4/10 and is just achy and stiff, no more sharp pains.     PAIN:  Are you having pain? Yes: NPRS scale: 2-4/10 Pain location: Left LE Pain description: aching at rest, sharp with use/walking Aggravating factors: standing and walking Relieving factors: rest       OBJECTIVE: (objective measures completed at initial evaluation unless otherwise dated)     DIAGNOSTIC FINDINGS:  07/22/22 Left Knee Radiograph: FINDINGS: Stable changes of plate and screw fixation of the tibial plateau. Mild residual distraction of sub chondral fragments without seen step-off deformity. No new fracture or dislocation. Small effusion in the suprapatellar bursa   CT of the chest abdomen pelvis showed a T7 mild anterior vertebral body compression fracture with approximately 20% height loss.   PATIENT SURVEYS:  Eval:  FOTO 26% (estimated 56% by visit 19)   COGNITION:           Overall cognitive status:  Pt reports that his memory is getting more back to baseline and he is more alert now                                SENSATION: Reports numbness on bilateral LE   EDEMA:  Edema noted on LLE   MUSCLE LENGTH: Hamstring tightness noted   POSTURE: rounded shoulders and forward head   PALPATION: Mild tenderness to palpation  along left lower leg   LOWER  EXTREMITY ROM:  09/13/22: Lt knee flexion 120 deg                          09/08/22: Lt knee flexion 108 deg 09/01/2022:  Right knee is 0-120, Left knee is 0 to 100 degrees   LOWER EXTREMITY MMT:   Left hip and hamstring strength is 4 to 4+/5 Right hip and knee are 5/5   FUNCTIONAL TESTS:  Eval: 5 times sit to stand: 17.7 sec with UE use   09/08/22:  TUG: 16" with 2-wheel walker; TUG: 11 sec no AD, close supervision 3' walk test with 2-wheel walker: 353'   10/04/22: 3' walk test without AD 660', good symmetry in gait   GAIT: Distance walked: 50 ft Assistive device utilized: Environmental consultant - 2 wheeled Level of assistance: SBA Comments: Antalgic gait pattern with decreased stance time through LLE       TODAY'S TREATMENT: 10/04/22: NuStep L5 x 6' PT present to discuss progress LAQ 2x10 2lb chair + pad, Lt only Hip matrix Lt only 40lb 2x10 abd, ext, flexion Bil heel raise x10, Lt heel raise x 10 Squat to chair holding 10lb kbell x 10 3 MWT without AD 660', good symmetry in gait Leg press bil LE 90lb 2x10, Lt only 50lb 1x10 Step up 6" Lt LE with contralateral march to 3rd step, no UE support SLS on foam pad with single UE ski pole x 30" Lt SLS standing T with UE touch to tall cone x 10, single UE on counter  09/15/2022:   Recumbent bike 4 min, lvl 1. Gait training: stepping over hurdles fwd/side Standing hip flexion with UE slide at wall Sidelying Lt hip abd #2.5 2x10 reps Prone Lt hip ext #2.5 2x10 reps  Leg press with L LE only, 2x10   Seated Lt tibial IR/ER x10 reps without pain  Heel raises weight shifted Lt, PT tactile cues x15 reps Step up lateral 4", 1UE support, 2x10 reps  Step up forward 4" 1 UE support 2x10 reps Supine Lt SLR x20 reps   09/13/22: Gait training: stepping over hurdles fwd/side Standing hip flexion with UE slide at wall Sidelying Lt hip abd #2.5 2x10 reps Prone Lt hip ext #2.5 2x10 reps  Supine hip extension on  green physioball x10 reps Supine hamstring curl with green phsioball x10 reps  Seated Lt tibial IR/ER x10 reps without pain  Heel raises weight shifted Lt, PT tactile cues x15 reps Step down lateral 4", 2 UE support, 2x6 reps  Step up forward 4" 1 UE support x15 reps Lt SLS with Rt LE prop on half foam roll: green plyo toss x20 reps Supine Lt SLR x20 reps   09/08/22: Pt with ind ambulation without AD from waiting room to NuStep NuStep L1 x 7' PT present to monitor Supine heel slides 2x10 (ROM knee flexion measures 108 today) Lt SLR 2x10  Supine bridge 2x10 SL Lt hip abd 2x10 and clam 2x10 3' walk test with 2-wheel walker: 353' TUG: 11 sec no AD, 16 sec with 2-wheel walker LAQ 10x5" Standing bil heel raises x 20 Supine hamstring curl feet on red physioball x 15 Added SL hip abd and standing bil heel raise to HEP   EVAL: 09/01/2022:  Reviewed HEP     PATIENT EDUCATION:  Education details: Issued HEP Person educated: Patient Education method: Explanation, Demonstration, and Handouts Education comprehension: verbalized understanding     HOME EXERCISE PROGRAM: Access Code: [email or web address] URL: [email or web address]  Date: 09/08/2022 Prepared by: Loistine Simas Aston Lawhorn   Exercises - Sit to Stand with Armchair  - 1 x daily - 7 x weekly - 2 sets - 10 reps - Supine Bridge  - 1 x daily - 7 x weekly - 2 sets - 10 reps - Supine Heel Slide  - 1 x daily - 7 x weekly - 2 sets - 10 reps - Supine Active Straight Leg Raise  - 1 x daily - 7 x weekly - 2 sets - 10 reps - Clamshell  - 1 x daily - 7 x weekly - 2 sets - 10 reps - Sidelying Hip Abduction  - 1 x daily - 7 x weekly - 2 sets - 10 reps - Standing Heel Raise  - 1 x daily - 7 x weekly - 2 sets - 20 reps   ASSESSMENT:   CLINICAL IMPRESSION: Pt missed nearly 3 weeks of PT due to Pt working on getting off pain meds.  He is now only taking ibuprofen intermittently.  He is ambulating without AD in home and community.  Pt is using  alternating pattern up/down stairs at home.  He is able to balance in SLS on Lt LE x 30 sec without UE assist.  He more than doubled distance in today without AD, covering 660' with good symmetry with gait.  Advanced all therex resistance without pain today.  Continue along POC.   OBJECTIVE IMPAIRMENTS decreased activity tolerance, decreased balance, difficulty walking, decreased ROM, decreased strength, increased edema, increased fascial restrictions, increased muscle spasms, impaired flexibility, and pain.    ACTIVITY LIMITATIONS carrying, lifting, bending, standing, squatting, and stairs   PARTICIPATION LIMITATIONS: cleaning, laundry, community activity, and occupation   PERSONAL FACTORS Past/current experiences, Time since onset of injury/illness/exacerbation, and 3+ comorbidities: multiple surgical interventions required following fall  are also affecting patient's functional outcome.    REHAB POTENTIAL: Good   CLINICAL DECISION MAKING: Evolving/moderate complexity   EVALUATION COMPLEXITY: Moderate     GOALS: Goals reviewed with patient? Yes   SHORT TERM GOALS: Target date: 09/22/2022  Pt will be independent with initial HEP. Baseline: Goal status: ongoing   2.  Pt will be able to participate in a TUG assessment with RW. Baseline:  Goal status: met both with and without AD     LONG TERM GOALS: Target date: 10/28/2022    Pt will be independent with advanced HEP. Baseline:  Goal status: ongoing   2.  Pt will report no increase in pain with ambulation with LRAD when walking around the grocery store. Baseline:  Goal status: met   3.  Pt will increase left knee A/ROM to 0 to 120 degrees to allow for easier navigation of stairs. Baseline:  Goal status: met   4.  Pt will increase left hip and hamstring strength to at least 5-/5 to allow him to navigate stairs with reciprocal pattern. Baseline:  Goal status: ongoing   5.  Pt will increase FOTO to at least 56% to  demonstrate improvements in functional tasks. Baseline: 26% Goal status: INITIAL   6.  Pt will be able to ambulate for at least 20 minutes with LRAD to allow him to go on easier hikes. Baseline:  Goal status: ongoing     PLAN: PT FREQUENCY: 2x/week   PT DURATION: 8 weeks   PLANNED INTERVENTIONS: Therapeutic exercises, Therapeutic activity, Neuromuscular re-education, Balance training, Gait training, Patient/Family education, Self Care, Joint mobilization, Joint manipulation, Stair training, Aquatic Therapy, Dry Needling, Electrical stimulation, Spinal manipulation,  Spinal mobilization, Cryotherapy, Moist heat, scar mobilization, Taping, Vasopneumatic device, Ionotophoresis 4mg /ml Dexamethasone, Manual therapy, and Re-evaluation   PLAN FOR NEXT SESSION: progress HEP, continue LE strength and single leg stabilization/balance/proprioception, gait endurance    Larri Yehle, PT 10/04/22 12:35 PM

## 2022-10-11 ENCOUNTER — Ambulatory Visit: Payer: BC Managed Care – PPO | Admitting: Rehabilitative and Restorative Service Providers"

## 2022-10-11 ENCOUNTER — Encounter: Payer: Self-pay | Admitting: Rehabilitative and Restorative Service Providers"

## 2022-10-11 DIAGNOSIS — R262 Difficulty in walking, not elsewhere classified: Secondary | ICD-10-CM

## 2022-10-11 DIAGNOSIS — M5459 Other low back pain: Secondary | ICD-10-CM | POA: Diagnosis not present

## 2022-10-11 DIAGNOSIS — M79605 Pain in left leg: Secondary | ICD-10-CM | POA: Diagnosis not present

## 2022-10-11 DIAGNOSIS — M6281 Muscle weakness (generalized): Secondary | ICD-10-CM

## 2022-10-11 NOTE — Therapy (Signed)
OUTPATIENT PHYSICAL THERAPY TREATMENT NOTE   Patient Name: Ryan Lynch MRN: [id number] DOB:08/21/1981, 41 y.o., male Today's Date: 10/11/2022    END OF SESSION:   PT End of Session - 10/11/22 1451     Visit Number 6    Date for PT Re-Evaluation 10/28/22    Authorization Type BC/BS    PT Start Time 1445    PT Stop Time 1525    PT Time Calculation (min) 40 min    Activity Tolerance Patient tolerated treatment well    Behavior During Therapy [facility] for tasks assessed/performed               History reviewed. No pertinent past medical history. Past Surgical History:  Procedure Laterality Date   ANTERIOR CRUCIATE LIGAMENT REPAIR Right    APPLICATION OF WOUND VAC Left 07/02/2022   Procedure: APPLICATION OF WOUND VAC;  Surgeon: Altamese Strasburg, MD;  Location: [place];  Service: Orthopedics;  Laterality: Left;   APPLICATION OF WOUND VAC Left 07/05/2022   Procedure: APPLICATION OF WOUND VAC;  Surgeon: Altamese [place]aderborn, MD;  Location: [place];  Service: Orthopedics;  Laterality: Left;   CLOSED REDUCTION TIBIA Left 07/02/2022   Procedure: CLOSED REDUCTION TIBIA;  Surgeon: Altamese [place]odi, MD;  Location: [place];  Service: Orthopedics;  Laterality: Left;   EXTERNAL FIXATION LEG Left 07/02/2022   Procedure: EXTERNAL FIXATION LEG;  Surgeon: Altamese [place], MD;  Location: [place];  Service: Orthopedics;  Laterality: Left;   FASCIOTOMY Left 07/02/2022   Procedure: FASCIOTOMY;  Surgeon: Altamese [place]ing[place], MD;  Location: [place];  Service: Orthopedics;  Laterality: Left;   I & D EXTREMITY Left 07/07/2022   Procedure: IRRIGATION AND DEBRIDEMENT EXTREMITY;  Surgeon: Altamese Grinnell, MD;  Location: [place];  Service: Orthopedics;  Laterality: Left;   ORIF TIBIA PLATEAU Left 07/05/2022   Procedure: OPEN REDUCTION INTERNAL FIXATION (ORIF) TIBIAL PLATEAU;  Surgeon: Altamese [place]erington, MD;  Location: [place];  Service: Orthopedics;  Laterality: Left;   SECONDARY CLOSURE OF WOUND Left 07/05/2022   Procedure: SECONDARY CLOSURE OF  WOUND;  Surgeon: Altamese [place], MD;  Location: [place];  Service: Orthopedics;  Laterality: Left;   SECONDARY CLOSURE OF WOUND Left 07/07/2022   Procedure: SECONDARY CLOSURE OF WOUND;  Surgeon: Altamese , MD;  Location: [place];  Service: Orthopedics;  Laterality: Left;   SHOULDER SURGERY Bilateral    Patient Active Problem List   Diagnosis Date Noted   Generalized anxiety disorder    Closed nondisplaced fracture of left tibial plateau 07/16/2022   Multiple rib fractures 07/11/2022   Compression fracture of body of thoracic vertebra ([place]) 07/11/2022   Sepsis ([place]) 07/09/2022   Acute urinary retention 07/09/2022   Prolonged QT interval 07/09/2022   AKI (acute kidney injury) ([place]) 07/07/2022   Alcohol withdrawal ([place]) 07/05/2022   Acute anemia 07/05/2022   Psoriasis 07/05/2022   Ileus ([place]) 81/77/1165   Acute metabolic encephalopathy 79/01/8332   Traumatic rhabdomyolysis ([place]) 07/03/2022   Observation after surgery 07/02/2022   Tibial plateau fracture, left 07/02/2022   Alcohol use 07/02/2022   Transaminitis 07/02/2022   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[place]) 07/02/2022   Adrenal nodule ([place]) 07/02/2022   Compression fracture of T7 vertebra ([place]) 07/02/2022   Thrombocytopenia ([place]) 07/02/2022   Anxiety 07/02/2022    REFERRING DIAG: O32.919T (ICD-10-CM) - Bicondylar fracture of left tibia Z98.890,Z87.81 (ICD-10-CM) - Status post ORIF of fracture of ankle T79.A29A (ICD-10-CM) - Compartment syndrome of lower leg ([place])    ONSET DATE: 07/02/2022    THERAPY DIAG:  Muscle weakness (generalized)   Difficulty in walking, not elsewhere classified   Other low back pain   Pain in left leg   Rationale for Evaluation and Treatment Rehabilitation   PERTINENT HISTORY:  [place] down approx 8 steps on 07/02/2022 and sustained T7 mild anterior vertebral body compression fracture and a left tibial plateau comminuted fracture requiring surgical intervention.  On 07/02/2022, pt underwent closed  reduction of the left tibial plateau with external fixator placed as well as 4 compartment fasciotomy of left leg.  On 07/05/22 Pt underwent left tibial plateau ORIF.  Both surgeries performed by Dr Marcelino Scot.   NWB initially, now WBAT with no restrictions listed   PRECAUTIONS: WBAT LLE, FALLS   SUBJECTIVE:                                                                                                                                                                                       SUBJECTIVE STATEMENT:  Pt reports that he is feeling better and stronger.     PAIN:  Are you having pain? Yes: NPRS scale: 0-4/10 Pain location: Left LE Pain description: aching at rest, sharp with use/walking Aggravating factors: standing and walking Relieving factors: rest       OBJECTIVE: (objective measures completed at initial evaluation unless otherwise dated)     DIAGNOSTIC FINDINGS:  07/22/22 Left Knee Radiograph: FINDINGS: Stable changes of plate and screw fixation of the tibial plateau. Mild residual distraction of sub chondral fragments without seen step-off deformity. No new fracture or dislocation. Small effusion in the suprapatellar bursa   CT of the chest abdomen pelvis showed a T7 mild anterior vertebral body compression fracture with approximately 20% height loss.   PATIENT SURVEYS:  Eval:  FOTO 26% (estimated 56% by visit 19)   COGNITION:           Overall cognitive status:  Pt reports that his memory is getting more back to baseline and he is more alert now                                SENSATION: Reports numbness on bilateral LE   EDEMA:  Edema noted on LLE   MUSCLE LENGTH: Hamstring tightness noted   POSTURE: rounded shoulders and forward head   PALPATION: Mild tenderness to palpation along left lower leg   LOWER EXTREMITY ROM:  09/13/22: Lt knee flexion 120 deg                          09/08/22: Lt knee flexion 108 deg 09/01/2022:  Right knee  is 0-120, Left knee  is 0 to 100 degrees   LOWER EXTREMITY MMT:   Left hip and hamstring strength is 4 to 4+/5 Right hip and knee are 5/5   FUNCTIONAL TESTS:  Eval: 5 times sit to stand: 17.7 sec with UE use   09/08/22:  TUG: 16" with 2-wheel walker; TUG: 11 sec no AD, close supervision 3' walk test with 2-wheel walker: 353'   10/04/22: 3' walk test without AD 660', good symmetry in gait   GAIT: Distance walked: 50 ft Assistive device utilized: Environmental consultant - 2 wheeled Level of assistance: SBA Comments: Antalgic gait pattern with decreased stance time through LLE       TODAY'S TREATMENT:  10/11/2022: Recumbent bike level 4 x8 min with PT present to discuss status Seated LAQ with 6# 3x10 LLE Ambulation x200 ft with 6# on bilateral LE with SBA High marching with 6# on each foot 2x 30 ft with SBA with occasional loss of balance Hip Matrix with 45#:  abduction, flexion, and extension 2x10 bilat Leg Press (seat at 6) 110# 2x10, LLE only 55# 2x10 Trampoline rebounder standing in kickstand position to work towards single leg stance on LLE x30 throws with red plyoball   10/04/22: NuStep L5 x 6' PT present to discuss progress LAQ 2x10 2lb chair + pad, Lt only Hip matrix Lt only 40lb 2x10 abd, ext, flexion Bil heel raise x10, Lt heel raise x 10 Squat to chair holding 10lb kbell x 10 3 MWT without AD 660', good symmetry in gait Leg press bil LE 90lb 2x10, Lt only 50lb 1x10 Step up 6" Lt LE with contralateral march to 3rd step, no UE support SLS on foam pad with single UE ski pole x 30" Lt SLS standing T with UE touch to tall cone x 10, single UE on counter  09/15/2022:   Recumbent bike 4 min, lvl 1. Gait training: stepping over hurdles fwd/side Standing hip flexion with UE slide at wall Sidelying Lt hip abd #2.5 2x10 reps Prone Lt hip ext #2.5 2x10 reps  Leg press with L LE only, 2x10   Seated Lt tibial IR/ER x10 reps without pain  Heel raises weight shifted Lt, PT tactile cues x15 reps Step up  lateral 4", 1UE support, 2x10 reps  Step up forward 4" 1 UE support 2x10 reps Supine Lt SLR x20 reps       PATIENT EDUCATION:  Education details: Issued HEP Person educated: Patient Education method: Explanation, Media planner, and Handouts Education comprehension: verbalized understanding     HOME EXERCISE PROGRAM: Access Code: [email or web address] URL: [email or web address] Date: 09/08/2022 Prepared by: Venetia Night Beuhring   Exercises - Sit to Stand with Armchair  - 1 x daily - 7 x weekly - 2 sets - 10 reps - Supine Bridge  - 1 x daily - 7 x weekly - 2 sets - 10 reps - Supine Heel Slide  - 1 x daily - 7 x weekly - 2 sets - 10 reps - Supine Active Straight Leg Raise  - 1 x daily - 7 x weekly - 2 sets - 10 reps - Clamshell  - 1 x daily - 7 x weekly - 2 sets - 10 reps - Sidelying Hip Abduction  - 1 x daily - 7 x weekly - 2 sets - 10 reps - Standing Heel Raise  - 1 x daily - 7 x weekly - 2 sets - 20 reps   ASSESSMENT:   CLINICAL IMPRESSION: Zamier able to present to skilled PT  today with reports of feeling stronger.  Pt was able to progress with strengthening during session today.  Progressed functional balance with strengthening during high marching down PT gym hallway.  Pt with occasional loss of balance that he was able to self correct and occasional reaching out to wall for steadying.  Pt continues to progress towards goal related activities.   OBJECTIVE IMPAIRMENTS decreased activity tolerance, decreased balance, difficulty walking, decreased ROM, decreased strength, increased edema, increased fascial restrictions, increased muscle spasms, impaired flexibility, and pain.    ACTIVITY LIMITATIONS carrying, lifting, bending, standing, squatting, and stairs   PARTICIPATION LIMITATIONS: cleaning, laundry, community activity, and occupation   PERSONAL FACTORS Past/current experiences, Time since onset of injury/illness/exacerbation, and 3+ comorbidities: multiple surgical  interventions required following fall  are also affecting patient's functional outcome.    REHAB POTENTIAL: Good   CLINICAL DECISION MAKING: Evolving/moderate complexity   EVALUATION COMPLEXITY: Moderate     GOALS: Goals reviewed with patient? Yes   SHORT TERM GOALS: Target date: 09/22/2022  Pt will be independent with initial HEP. Baseline: Goal status: MET   2.  Pt will be able to participate in a TUG assessment with RW. Baseline:  Goal status: met both with and without AD     LONG TERM GOALS: Target date: 10/28/2022    Pt will be independent with advanced HEP. Baseline:  Goal status: ongoing   2.  Pt will report no increase in pain with ambulation with LRAD when walking around the grocery store. Baseline:  Goal status: met   3.  Pt will increase left knee A/ROM to 0 to 120 degrees to allow for easier navigation of stairs. Baseline:  Goal status: met   4.  Pt will increase left hip and hamstring strength to at least 5-/5 to allow him to navigate stairs with reciprocal pattern. Baseline:  Goal status: ongoing   5.  Pt will increase FOTO to at least 56% to demonstrate improvements in functional tasks. Baseline: 26% Goal status: INITIAL   6.  Pt will be able to ambulate for at least 20 minutes with LRAD to allow him to go on easier hikes. Baseline:  Goal status: ongoing     PLAN: PT FREQUENCY: 2x/week   PT DURATION: 8 weeks   PLANNED INTERVENTIONS: Therapeutic exercises, Therapeutic activity, Neuromuscular re-education, Balance training, Gait training, Patient/Family education, Self Care, Joint mobilization, Joint manipulation, Stair training, Aquatic Therapy, Dry Needling, Electrical stimulation, Spinal manipulation, Spinal mobilization, Cryotherapy, Moist heat, scar mobilization, Taping, Vasopneumatic device, Ionotophoresis 26m/ml Dexamethasone, Manual therapy, and Re-evaluation   PLAN FOR NEXT SESSION: progress HEP, continue LE strength and single leg  stabilization/balance/proprioception, gait endurance    SJuel Burrow PT 10/11/22 3:41 PM    [facility] [address] [place] [address]Phone # [phone] [phone]

## 2022-10-13 ENCOUNTER — Ambulatory Visit: Payer: BC Managed Care – PPO | Admitting: Physical Therapy

## 2022-10-13 ENCOUNTER — Encounter: Payer: Self-pay | Admitting: Physical Therapy

## 2022-10-13 DIAGNOSIS — M6281 Muscle weakness (generalized): Secondary | ICD-10-CM | POA: Diagnosis not present

## 2022-10-13 DIAGNOSIS — M79605 Pain in left leg: Secondary | ICD-10-CM

## 2022-10-13 DIAGNOSIS — M5459 Other low back pain: Secondary | ICD-10-CM

## 2022-10-13 DIAGNOSIS — R262 Difficulty in walking, not elsewhere classified: Secondary | ICD-10-CM | POA: Diagnosis not present

## 2022-10-13 NOTE — Therapy (Signed)
OUTPATIENT PHYSICAL THERAPY TREATMENT NOTE   Patient Name: Ryan Lynch MRN: [id number] DOB:12/02/1980, 41 y.o., male Today's Date: 10/13/2022    END OF SESSION:   PT End of Session - 10/13/22 1445     Visit Number 7    Date for PT Re-Evaluation 10/28/22    Authorization Type BC/BS    PT Start Time 1443    PT Stop Time 1525    PT Time Calculation (min) 42 min    Activity Tolerance Patient tolerated treatment well    Behavior During Therapy WFL for tasks assessed/performed                History reviewed. No pertinent past medical history. Past Surgical History:  Procedure Laterality Date   ANTERIOR CRUCIATE LIGAMENT REPAIR Right    APPLICATION OF WOUND VAC Left 07/02/2022   Procedure: APPLICATION OF WOUND VAC;  Surgeon: Handy, Michael, MD;  [facility];  Service: Orthopedics;  Laterality: Left;   APPLICATION OF WOUND VAC Left 07/05/2022   Procedure: APPLICATION OF WOUND VAC;  Surgeon: Handy, Michael, MD;  [facility];  Service: Orthopedics;  Laterality: Left;   CLOSED REDUCTION TIBIA Left 07/02/2022   Procedure: CLOSED REDUCTION TIBIA;  Surgeon: Handy, Michael, MD;  [facility];  Service: Orthopedics;  Laterality: Left;   EXTERNAL FIXATION LEG Left 07/02/2022   Procedure: EXTERNAL FIXATION LEG;  Surgeon: Handy, Michael, MD;  [facility];  Service: Orthopedics;  Laterality: Left;   FASCIOTOMY Left 07/02/2022   Procedure: FASCIOTOMY;  Surgeon: Handy, Michael, MD;  [facility];  Service: Orthopedics;  Laterality: Left;   I & D EXTREMITY Left 07/07/2022   Procedure: IRRIGATION AND DEBRIDEMENT EXTREMITY;  Surgeon: Handy, Michael, MD;  [facility];  Service: Orthopedics;  Laterality: Left;   ORIF TIBIA PLATEAU Left 07/05/2022   Procedure: OPEN REDUCTION INTERNAL FIXATION (ORIF) TIBIAL PLATEAU;  Surgeon: Handy, Michael, MD;  [facility];  Service: Orthopedics;  Laterality: Left;   SECONDARY CLOSURE OF WOUND Left 07/05/2022   Procedure: SECONDARY CLOSURE  OF WOUND;  Surgeon: Handy, Michael, MD;  [facility];  Service: Orthopedics;  Laterality: Left;   SECONDARY CLOSURE OF WOUND Left 07/07/2022   Procedure: SECONDARY CLOSURE OF WOUND;  Surgeon: Handy, Michael, MD;  [facility];  Service: Orthopedics;  Laterality: Left;   SHOULDER SURGERY Bilateral    Patient Active Problem List   Diagnosis Date Noted   Generalized anxiety disorder    Closed nondisplaced fracture of left tibial plateau 07/16/2022   Multiple rib fractures 07/11/2022   Compression fracture of body of thoracic vertebra ([name]) 07/11/2022   Sepsis ([name]) 07/09/2022   Acute urinary retention 07/09/2022   Prolonged QT interval 07/09/2022   AKI (acute kidney injury) ([name]) 07/07/2022   Alcohol withdrawal ([name]) 07/05/2022   Acute anemia 07/05/2022   Psoriasis 07/05/2022   Ileus ([name]) 07/04/2022   Acute metabolic encephalopathy 07/04/2022   Traumatic rhabdomyolysis ([name]) 07/03/2022   Observation after surgery 07/02/2022   Tibial plateau fracture, left 07/02/2022   Alcohol use 07/02/2022   Transaminitis 07/02/2022   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[name]) 07/02/2022   Adrenal nodule ([name]) 07/02/2022   Compression fracture of T7 vertebra ([name]) 07/02/2022   Thrombocytopenia ([name]) 07/02/2022   Anxiety 07/02/2022    REFERRING DIAG: S82.142A (ICD-10-CM) - Bicondylar fracture of left tibia Z98.890,Z87.81 (ICD-10-CM) - Status post ORIF of fracture of ankle T79.A29A (ICD-10-CM) - Compartment syndrome of lower leg ([name])    ONSET DATE: 07/02/2022    THERAPY   OUTPATIENT PHYSICAL THERAPY TREATMENT NOTE   Patient Name: Ryan Lynch MRN: [id number] DOB:12/02/1980, 41 y.o., male Today's Date: 10/13/2022    END OF SESSION:   PT End of Session - 10/13/22 1445     Visit Number 7    Date for PT Re-Evaluation 10/28/22    Authorization Type BC/BS    PT Start Time 1443    PT Stop Time 1525    PT Time Calculation (min) 42 min    Activity Tolerance Patient tolerated treatment well    Behavior During Therapy WFL for tasks assessed/performed                History reviewed. No pertinent past medical history. Past Surgical History:  Procedure Laterality Date   ANTERIOR CRUCIATE LIGAMENT REPAIR Right    APPLICATION OF WOUND VAC Left 07/02/2022   Procedure: APPLICATION OF WOUND VAC;  Surgeon: Handy, Michael, MD;  [facility];  Service: Orthopedics;  Laterality: Left;   APPLICATION OF WOUND VAC Left 07/05/2022   Procedure: APPLICATION OF WOUND VAC;  Surgeon: Handy, Michael, MD;  [facility];  Service: Orthopedics;  Laterality: Left;   CLOSED REDUCTION TIBIA Left 07/02/2022   Procedure: CLOSED REDUCTION TIBIA;  Surgeon: Handy, Michael, MD;  [facility];  Service: Orthopedics;  Laterality: Left;   EXTERNAL FIXATION LEG Left 07/02/2022   Procedure: EXTERNAL FIXATION LEG;  Surgeon: Handy, Michael, MD;  [facility];  Service: Orthopedics;  Laterality: Left;   FASCIOTOMY Left 07/02/2022   Procedure: FASCIOTOMY;  Surgeon: Handy, Michael, MD;  [facility];  Service: Orthopedics;  Laterality: Left;   I & D EXTREMITY Left 07/07/2022   Procedure: IRRIGATION AND DEBRIDEMENT EXTREMITY;  Surgeon: Handy, Michael, MD;  [facility];  Service: Orthopedics;  Laterality: Left;   ORIF TIBIA PLATEAU Left 07/05/2022   Procedure: OPEN REDUCTION INTERNAL FIXATION (ORIF) TIBIAL PLATEAU;  Surgeon: Handy, Michael, MD;  [facility];  Service: Orthopedics;  Laterality: Left;   SECONDARY CLOSURE OF WOUND Left 07/05/2022   Procedure: SECONDARY CLOSURE  OF WOUND;  Surgeon: Handy, Michael, MD;  [facility];  Service: Orthopedics;  Laterality: Left;   SECONDARY CLOSURE OF WOUND Left 07/07/2022   Procedure: SECONDARY CLOSURE OF WOUND;  Surgeon: Handy, Michael, MD;  [facility];  Service: Orthopedics;  Laterality: Left;   SHOULDER SURGERY Bilateral    Patient Active Problem List   Diagnosis Date Noted   Generalized anxiety disorder    Closed nondisplaced fracture of left tibial plateau 07/16/2022   Multiple rib fractures 07/11/2022   Compression fracture of body of thoracic vertebra ([name]) 07/11/2022   Sepsis ([name]) 07/09/2022   Acute urinary retention 07/09/2022   Prolonged QT interval 07/09/2022   AKI (acute kidney injury) ([name]) 07/07/2022   Alcohol withdrawal ([name]) 07/05/2022   Acute anemia 07/05/2022   Psoriasis 07/05/2022   Ileus ([name]) 07/04/2022   Acute metabolic encephalopathy 07/04/2022   Traumatic rhabdomyolysis ([name]) 07/03/2022   Observation after surgery 07/02/2022   Tibial plateau fracture, left 07/02/2022   Alcohol use 07/02/2022   Transaminitis 07/02/2022   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[name]) 07/02/2022   Adrenal nodule ([name]) 07/02/2022   Compression fracture of T7 vertebra ([name]) 07/02/2022   Thrombocytopenia ([name]) 07/02/2022   Anxiety 07/02/2022    REFERRING DIAG: S82.142A (ICD-10-CM) - Bicondylar fracture of left tibia Z98.890,Z87.81 (ICD-10-CM) - Status post ORIF of fracture of ankle T79.A29A (ICD-10-CM) - Compartment syndrome of lower leg ([name])    ONSET DATE: 07/02/2022    THERAPY   OUTPATIENT PHYSICAL THERAPY TREATMENT NOTE   Patient Name: Ryan Lynch MRN: [id number] DOB:12/02/1980, 41 y.o., male Today's Date: 10/13/2022    END OF SESSION:   PT End of Session - 10/13/22 1445     Visit Number 7    Date for PT Re-Evaluation 10/28/22    Authorization Type BC/BS    PT Start Time 1443    PT Stop Time 1525    PT Time Calculation (min) 42 min    Activity Tolerance Patient tolerated treatment well    Behavior During Therapy WFL for tasks assessed/performed                History reviewed. No pertinent past medical history. Past Surgical History:  Procedure Laterality Date   ANTERIOR CRUCIATE LIGAMENT REPAIR Right    APPLICATION OF WOUND VAC Left 07/02/2022   Procedure: APPLICATION OF WOUND VAC;  Surgeon: Handy, Michael, MD;  [facility];  Service: Orthopedics;  Laterality: Left;   APPLICATION OF WOUND VAC Left 07/05/2022   Procedure: APPLICATION OF WOUND VAC;  Surgeon: Handy, Michael, MD;  [facility];  Service: Orthopedics;  Laterality: Left;   CLOSED REDUCTION TIBIA Left 07/02/2022   Procedure: CLOSED REDUCTION TIBIA;  Surgeon: Handy, Michael, MD;  [facility];  Service: Orthopedics;  Laterality: Left;   EXTERNAL FIXATION LEG Left 07/02/2022   Procedure: EXTERNAL FIXATION LEG;  Surgeon: Handy, Michael, MD;  [facility];  Service: Orthopedics;  Laterality: Left;   FASCIOTOMY Left 07/02/2022   Procedure: FASCIOTOMY;  Surgeon: Handy, Michael, MD;  [facility];  Service: Orthopedics;  Laterality: Left;   I & D EXTREMITY Left 07/07/2022   Procedure: IRRIGATION AND DEBRIDEMENT EXTREMITY;  Surgeon: Handy, Michael, MD;  [facility];  Service: Orthopedics;  Laterality: Left;   ORIF TIBIA PLATEAU Left 07/05/2022   Procedure: OPEN REDUCTION INTERNAL FIXATION (ORIF) TIBIAL PLATEAU;  Surgeon: Handy, Michael, MD;  [facility];  Service: Orthopedics;  Laterality: Left;   SECONDARY CLOSURE OF WOUND Left 07/05/2022   Procedure: SECONDARY CLOSURE  OF WOUND;  Surgeon: Handy, Michael, MD;  [facility];  Service: Orthopedics;  Laterality: Left;   SECONDARY CLOSURE OF WOUND Left 07/07/2022   Procedure: SECONDARY CLOSURE OF WOUND;  Surgeon: Handy, Michael, MD;  [facility];  Service: Orthopedics;  Laterality: Left;   SHOULDER SURGERY Bilateral    Patient Active Problem List   Diagnosis Date Noted   Generalized anxiety disorder    Closed nondisplaced fracture of left tibial plateau 07/16/2022   Multiple rib fractures 07/11/2022   Compression fracture of body of thoracic vertebra ([name]) 07/11/2022   Sepsis ([name]) 07/09/2022   Acute urinary retention 07/09/2022   Prolonged QT interval 07/09/2022   AKI (acute kidney injury) ([name]) 07/07/2022   Alcohol withdrawal ([name]) 07/05/2022   Acute anemia 07/05/2022   Psoriasis 07/05/2022   Ileus ([name]) 07/04/2022   Acute metabolic encephalopathy 07/04/2022   Traumatic rhabdomyolysis ([name]) 07/03/2022   Observation after surgery 07/02/2022   Tibial plateau fracture, left 07/02/2022   Alcohol use 07/02/2022   Transaminitis 07/02/2022   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[name]) 07/02/2022   Adrenal nodule ([name]) 07/02/2022   Compression fracture of T7 vertebra ([name]) 07/02/2022   Thrombocytopenia ([name]) 07/02/2022   Anxiety 07/02/2022    REFERRING DIAG: S82.142A (ICD-10-CM) - Bicondylar fracture of left tibia Z98.890,Z87.81 (ICD-10-CM) - Status post ORIF of fracture of ankle T79.A29A (ICD-10-CM) - Compartment syndrome of lower leg ([name])    ONSET DATE: 07/02/2022    THERAPY   OUTPATIENT PHYSICAL THERAPY TREATMENT NOTE   Patient Name: Ryan Lynch MRN: [id number] DOB:12/02/1980, 41 y.o., male Today's Date: 10/13/2022    END OF SESSION:   PT End of Session - 10/13/22 1445     Visit Number 7    Date for PT Re-Evaluation 10/28/22    Authorization Type BC/BS    PT Start Time 1443    PT Stop Time 1525    PT Time Calculation (min) 42 min    Activity Tolerance Patient tolerated treatment well    Behavior During Therapy WFL for tasks assessed/performed                History reviewed. No pertinent past medical history. Past Surgical History:  Procedure Laterality Date   ANTERIOR CRUCIATE LIGAMENT REPAIR Right    APPLICATION OF WOUND VAC Left 07/02/2022   Procedure: APPLICATION OF WOUND VAC;  Surgeon: Handy, Michael, MD;  [facility];  Service: Orthopedics;  Laterality: Left;   APPLICATION OF WOUND VAC Left 07/05/2022   Procedure: APPLICATION OF WOUND VAC;  Surgeon: Handy, Michael, MD;  [facility];  Service: Orthopedics;  Laterality: Left;   CLOSED REDUCTION TIBIA Left 07/02/2022   Procedure: CLOSED REDUCTION TIBIA;  Surgeon: Handy, Michael, MD;  [facility];  Service: Orthopedics;  Laterality: Left;   EXTERNAL FIXATION LEG Left 07/02/2022   Procedure: EXTERNAL FIXATION LEG;  Surgeon: Handy, Michael, MD;  [facility];  Service: Orthopedics;  Laterality: Left;   FASCIOTOMY Left 07/02/2022   Procedure: FASCIOTOMY;  Surgeon: Handy, Michael, MD;  [facility];  Service: Orthopedics;  Laterality: Left;   I & D EXTREMITY Left 07/07/2022   Procedure: IRRIGATION AND DEBRIDEMENT EXTREMITY;  Surgeon: Handy, Michael, MD;  [facility];  Service: Orthopedics;  Laterality: Left;   ORIF TIBIA PLATEAU Left 07/05/2022   Procedure: OPEN REDUCTION INTERNAL FIXATION (ORIF) TIBIAL PLATEAU;  Surgeon: Handy, Michael, MD;  [facility];  Service: Orthopedics;  Laterality: Left;   SECONDARY CLOSURE OF WOUND Left 07/05/2022   Procedure: SECONDARY CLOSURE  OF WOUND;  Surgeon: Handy, Michael, MD;  [facility];  Service: Orthopedics;  Laterality: Left;   SECONDARY CLOSURE OF WOUND Left 07/07/2022   Procedure: SECONDARY CLOSURE OF WOUND;  Surgeon: Handy, Michael, MD;  [facility];  Service: Orthopedics;  Laterality: Left;   SHOULDER SURGERY Bilateral    Patient Active Problem List   Diagnosis Date Noted   Generalized anxiety disorder    Closed nondisplaced fracture of left tibial plateau 07/16/2022   Multiple rib fractures 07/11/2022   Compression fracture of body of thoracic vertebra ([name]) 07/11/2022   Sepsis ([name]) 07/09/2022   Acute urinary retention 07/09/2022   Prolonged QT interval 07/09/2022   AKI (acute kidney injury) ([name]) 07/07/2022   Alcohol withdrawal ([name]) 07/05/2022   Acute anemia 07/05/2022   Psoriasis 07/05/2022   Ileus ([name]) 07/04/2022   Acute metabolic encephalopathy 07/04/2022   Traumatic rhabdomyolysis ([name]) 07/03/2022   Observation after surgery 07/02/2022   Tibial plateau fracture, left 07/02/2022   Alcohol use 07/02/2022   Transaminitis 07/02/2022   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[name]) 07/02/2022   Adrenal nodule ([name]) 07/02/2022   Compression fracture of T7 vertebra ([name]) 07/02/2022   Thrombocytopenia ([name]) 07/02/2022   Anxiety 07/02/2022    REFERRING DIAG: S82.142A (ICD-10-CM) - Bicondylar fracture of left tibia Z98.890,Z87.81 (ICD-10-CM) - Status post ORIF of fracture of ankle T79.A29A (ICD-10-CM) - Compartment syndrome of lower leg ([name])    ONSET DATE: 07/02/2022    THERAPY   OUTPATIENT PHYSICAL THERAPY TREATMENT NOTE   Patient Name: Ryan Lynch MRN: [id number] DOB:12/02/1980, 41 y.o., male Today's Date: 10/13/2022    END OF SESSION:   PT End of Session - 10/13/22 1445     Visit Number 7    Date for PT Re-Evaluation 10/28/22    Authorization Type BC/BS    PT Start Time 1443    PT Stop Time 1525    PT Time Calculation (min) 42 min    Activity Tolerance Patient tolerated treatment well    Behavior During Therapy WFL for tasks assessed/performed                History reviewed. No pertinent past medical history. Past Surgical History:  Procedure Laterality Date   ANTERIOR CRUCIATE LIGAMENT REPAIR Right    APPLICATION OF WOUND VAC Left 07/02/2022   Procedure: APPLICATION OF WOUND VAC;  Surgeon: Handy, Michael, MD;  [facility];  Service: Orthopedics;  Laterality: Left;   APPLICATION OF WOUND VAC Left 07/05/2022   Procedure: APPLICATION OF WOUND VAC;  Surgeon: Handy, Michael, MD;  [facility];  Service: Orthopedics;  Laterality: Left;   CLOSED REDUCTION TIBIA Left 07/02/2022   Procedure: CLOSED REDUCTION TIBIA;  Surgeon: Handy, Michael, MD;  [facility];  Service: Orthopedics;  Laterality: Left;   EXTERNAL FIXATION LEG Left 07/02/2022   Procedure: EXTERNAL FIXATION LEG;  Surgeon: Handy, Michael, MD;  [facility];  Service: Orthopedics;  Laterality: Left;   FASCIOTOMY Left 07/02/2022   Procedure: FASCIOTOMY;  Surgeon: Handy, Michael, MD;  [facility];  Service: Orthopedics;  Laterality: Left;   I & D EXTREMITY Left 07/07/2022   Procedure: IRRIGATION AND DEBRIDEMENT EXTREMITY;  Surgeon: Handy, Michael, MD;  [facility];  Service: Orthopedics;  Laterality: Left;   ORIF TIBIA PLATEAU Left 07/05/2022   Procedure: OPEN REDUCTION INTERNAL FIXATION (ORIF) TIBIAL PLATEAU;  Surgeon: Handy, Michael, MD;  [facility];  Service: Orthopedics;  Laterality: Left;   SECONDARY CLOSURE OF WOUND Left 07/05/2022   Procedure: SECONDARY CLOSURE  OF WOUND;  Surgeon: Handy, Michael, MD;  [facility];  Service: Orthopedics;  Laterality: Left;   SECONDARY CLOSURE OF WOUND Left 07/07/2022   Procedure: SECONDARY CLOSURE OF WOUND;  Surgeon: Handy, Michael, MD;  [facility];  Service: Orthopedics;  Laterality: Left;   SHOULDER SURGERY Bilateral    Patient Active Problem List   Diagnosis Date Noted   Generalized anxiety disorder    Closed nondisplaced fracture of left tibial plateau 07/16/2022   Multiple rib fractures 07/11/2022   Compression fracture of body of thoracic vertebra ([name]) 07/11/2022   Sepsis ([name]) 07/09/2022   Acute urinary retention 07/09/2022   Prolonged QT interval 07/09/2022   AKI (acute kidney injury) ([name]) 07/07/2022   Alcohol withdrawal ([name]) 07/05/2022   Acute anemia 07/05/2022   Psoriasis 07/05/2022   Ileus ([name]) 07/04/2022   Acute metabolic encephalopathy 07/04/2022   Traumatic rhabdomyolysis ([name]) 07/03/2022   Observation after surgery 07/02/2022   Tibial plateau fracture, left 07/02/2022   Alcohol use 07/02/2022   Transaminitis 07/02/2022   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[name]) 07/02/2022   Adrenal nodule ([name]) 07/02/2022   Compression fracture of T7 vertebra ([name]) 07/02/2022   Thrombocytopenia ([name]) 07/02/2022   Anxiety 07/02/2022    REFERRING DIAG: S82.142A (ICD-10-CM) - Bicondylar fracture of left tibia Z98.890,Z87.81 (ICD-10-CM) - Status post ORIF of fracture of ankle T79.A29A (ICD-10-CM) - Compartment syndrome of lower leg ([name])    ONSET DATE: 07/02/2022    THERAPY

## 2022-10-17 ENCOUNTER — Ambulatory Visit
Payer: BC Managed Care – PPO | Attending: Orthopedic Surgery | Admitting: Rehabilitative and Restorative Service Providers"

## 2022-10-17 ENCOUNTER — Encounter: Payer: Self-pay | Admitting: Rehabilitative and Restorative Service Providers"

## 2022-10-17 DIAGNOSIS — M79605 Pain in left leg: Secondary | ICD-10-CM | POA: Diagnosis not present

## 2022-10-17 DIAGNOSIS — R262 Difficulty in walking, not elsewhere classified: Secondary | ICD-10-CM | POA: Diagnosis not present

## 2022-10-17 DIAGNOSIS — M6281 Muscle weakness (generalized): Secondary | ICD-10-CM | POA: Insufficient documentation

## 2022-10-17 DIAGNOSIS — M5459 Other low back pain: Secondary | ICD-10-CM | POA: Diagnosis not present

## 2022-10-17 NOTE — Therapy (Addendum)
OUTPATIENT PHYSICAL THERAPY TREATMENT NOTE AND LATE ENTRY DISCHARGE SUMMARY   Patient Name: Ryan Lynch MRN: [id number] DOB:1981/09/09, 41 y.o., male Today's Date: 10/17/2022    END OF SESSION:   PT End of Session - 10/17/22 1105     Visit Number 8    Date for PT Re-Evaluation 10/28/22    Authorization Type BC/BS    PT Start Time 1100    PT Stop Time 1140    PT Time Calculation (min) 40 min    Activity Tolerance Patient tolerated treatment well    Behavior During Therapy [facility] for tasks assessed/performed                History reviewed. No pertinent past medical history. Past Surgical History:  Procedure Laterality Date   ANTERIOR CRUCIATE LIGAMENT REPAIR Right    APPLICATION OF WOUND VAC Left 07/02/2022   Procedure: APPLICATION OF WOUND VAC;  Surgeon: Myrene Galas, MD;  Location: [facility] OR;  Service: Orthopedics;  Laterality: Left;   APPLICATION OF WOUND VAC Left 07/05/2022   Procedure: APPLICATION OF WOUND VAC;  Surgeon: Myrene Galas, MD;  Location: [facility] OR;  Service: Orthopedics;  Laterality: Left;   CLOSED REDUCTION TIBIA Left 07/02/2022   Procedure: CLOSED REDUCTION TIBIA;  Surgeon: Myrene Galas, MD;  Location: [facility];  Service: Orthopedics;  Laterality: Left;   EXTERNAL FIXATION LEG Left 07/02/2022   Procedure: EXTERNAL FIXATION LEG;  Surgeon: Myrene Galas, MD;  Location: [facility] OR;  Service: Orthopedics;  Laterality: Left;   FASCIOTOMY Left 07/02/2022   Procedure: FASCIOTOMY;  Surgeon: Myrene Galas, MD;  Location: [facility] OR;  Service: Orthopedics;  Laterality: Left;   I & D EXTREMITY Left 07/07/2022   Procedure: IRRIGATION AND DEBRIDEMENT EXTREMITY;  Surgeon: Myrene Galas, MD;  Location: [facility] OR;  Service: Orthopedics;  Laterality: Left;   ORIF TIBIA PLATEAU Left 07/05/2022   Procedure: OPEN REDUCTION INTERNAL FIXATION (ORIF) TIBIAL PLATEAU;  Surgeon: Myrene Galas, MD;  [facility];  Service: Orthopedics;  Laterality: Left;   SECONDARY CLOSURE OF WOUND Left 07/05/2022    Procedure: SECONDARY CLOSURE OF WOUND;  Surgeon: Myrene Galas, MD;  [facility];  Service: Orthopedics;  Laterality: Left;   SECONDARY CLOSURE OF WOUND Left 07/07/2022   Procedure: SECONDARY CLOSURE OF WOUND;  Surgeon: Myrene Galas, MD;  [facility];  Service: Orthopedics;  Laterality: Left;   SHOULDER SURGERY Bilateral    Patient Active Problem List   Diagnosis Date Noted   Generalized anxiety disorder    Closed nondisplaced fracture of left tibial plateau 07/16/2022   Multiple rib fractures 07/11/2022   Compression fracture of body of thoracic vertebra ([name]) 07/11/2022   Sepsis ([name]) 07/09/2022   Acute urinary retention 07/09/2022   Prolonged QT interval 07/09/2022   AKI (acute kidney injury) ([name]) 07/07/2022   Alcohol withdrawal ([name]) 07/05/2022   Acute anemia 07/05/2022   Psoriasis 07/05/2022   Ileus ([name]) 07/04/2022   Acute metabolic encephalopathy 07/04/2022   Traumatic rhabdomyolysis ([name]) 07/03/2022   Observation after surgery 07/02/2022   Tibial plateau fracture, left 07/02/2022   Alcohol use 07/02/2022   Transaminitis 07/02/2022   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[name]) 07/02/2022   Adrenal nodule ([name]) 07/02/2022   Compression fracture of T7 vertebra ([name]) 07/02/2022   Thrombocytopenia ([name]) 07/02/2022   Anxiety 07/02/2022    REFERRING DIAG: W09.811B (ICD-10-CM) - Bicondylar fracture of left tibia Z98.890,Z87.81 (ICD-10-CM) - Status post ORIF of fracture of ankle T79.A29A (ICD-10-CM) - Compartment syndrome of lower leg ([name])    ONSET DATE:  07/02/2022    THERAPY DIAG:  Muscle weakness (generalized)   Difficulty in walking, not elsewhere classified   Other low back pain   Pain in left leg   Rationale for Evaluation and Treatment Rehabilitation   PERTINENT HISTORY:  Larey Seat down approx 8 steps on 07/02/2022 and sustained T7 mild anterior vertebral body compression fracture and a left tibial plateau comminuted fracture requiring surgical intervention.  On  07/02/2022, pt underwent closed reduction of the left tibial plateau with external fixator placed as well as 4 compartment fasciotomy of left leg.  On 07/05/22 Pt underwent left tibial plateau ORIF.  Both surgeries performed by Dr Carola Frost.   NWB initially, now WBAT with no restrictions listed   PRECAUTIONS: WBAT LLE, FALLS   SUBJECTIVE:                                                                                                                                                                                       SUBJECTIVE STATEMENT:  Pt states that he tripped over a towel and hit his knee on the door.  States that he did not go to MD for check up and he does not feel that he Fx or damaged anything, but he is having some increased pain today.  States that pain is less today than it was yesterday when he tripped and hit his knee.     PAIN:  Are you having pain? Yes: NPRS scale: 5/10 Pain location: Left LE Pain description: aching at rest, sharp with use/walking Aggravating factors: standing and walking Relieving factors: rest       OBJECTIVE: (objective measures completed at initial evaluation unless otherwise dated)     DIAGNOSTIC FINDINGS:  07/22/22 Left Knee Radiograph: FINDINGS: Stable changes of plate and screw fixation of the tibial plateau. Mild residual distraction of sub chondral fragments without seen step-off deformity. No new fracture or dislocation. Small effusion in the suprapatellar bursa   CT of the chest abdomen pelvis showed a T7 mild anterior vertebral body compression fracture with approximately 20% height loss.   PATIENT SURVEYS:  Eval:  FOTO 26% (estimated 56% by visit 19) 10/17/2022:  FOTO 59%   COGNITION:           Overall cognitive status:  Pt reports that his memory is getting more back to baseline and he is more alert now                                SENSATION: Reports numbness on bilateral LE   EDEMA:  Edema noted on LLE   MUSCLE  LENGTH: Hamstring tightness  noted   POSTURE: rounded shoulders and forward head   PALPATION: Mild tenderness to palpation along left lower leg   LOWER EXTREMITY ROM:  09/13/22: Lt knee flexion 120 deg                          09/08/22: Lt knee flexion 108 deg 09/01/2022:  Right knee is 0-120, Left knee is 0 to 100 degrees   LOWER EXTREMITY MMT:   Left hip and hamstring strength is 4 to 4+/5 Right hip and knee are 5/5   FUNCTIONAL TESTS:  Eval: 5 times sit to stand: 17.7 sec with UE use   09/08/22:  TUG: 16" with 2-wheel walker; TUG: 11 sec no AD, close supervision 3' walk test with 2-wheel walker: 353'   10/04/22: 3' walk test without AD 660', good symmetry in gait   GAIT: Distance walked: 50 ft Assistive device utilized: Environmental consultant - 2 wheeled Level of assistance: SBA Comments: Antalgic gait pattern with decreased stance time through LLE       TODAY'S TREATMENT:  10/17/2022: NuStep level 5 x10 min with PT present to discuss Standing on trampoline performing 3 way foot position weight shift x1 min each Standing marching on trampoline 2x10 bilat Supine performing knee flexion with feet on red pball 2x10 Supine bridging with LE on red pball 2x10 On mat:  3 way straight leg raise with 2.5# 2x10 bilat each Side and back lunging with unilateral foot on slider x10 bilat each   10/13/22 NuStep L6 x8' with PT present to discuss status 6lb ankle weights high knee fwd and bwd march walk down long hallway and back, no LOB Standing march alt LE 6lb ankle weights x 10 Seated LAQ with 6" 3x10 LLE Hip Matrix with 45#:  abduction, flexion, and extension 2x10 bilat Leg Press (seat at 6) 110# 2x10, LLE only 55# 2x10 Standing T on Lt LE 10lb kbell 2x10 Rt foot on slider side lunge/back lunge alt x 10 Supine heels on red physioball bridge hold with hamstring curl x10, then straight leg bridge x10 Resisted cable pulley backward walking 20lb with belt on x10   10/11/2022: Recumbent  bike level 4 x8 min with PT present to discuss status Seated LAQ with 6# 3x10 LLE Ambulation x200 ft with 6# on bilateral LE with SBA High marching with 6# on each foot 2x 30 ft with SBA with occasional loss of balance Hip Matrix with 45#:  abduction, flexion, and extension 2x10 bilat Leg Press (seat at 6) 110# 2x10, LLE only 55# 2x10 Trampoline rebounder standing in kickstand position to work towards single leg stance on LLE x30 throws with red plyoball     PATIENT EDUCATION:  Education details: Issued HEP Person educated: Patient Education method: Programmer, multimedia, Facilities manager, and Handouts Education comprehension: verbalized understanding     HOME EXERCISE PROGRAM: Access Code: [email or web address] URL: [email or web address] Date: 09/08/2022 Prepared by: Loistine Simas Beuhring   Exercises - Sit to Stand with Armchair  - 1 x daily - 7 x weekly - 2 sets - 10 reps - Supine Bridge  - 1 x daily - 7 x weekly - 2 sets - 10 reps - Supine Heel Slide  - 1 x daily - 7 x weekly - 2 sets - 10 reps - Supine Active Straight Leg Raise  - 1 x daily - 7 x weekly - 2 sets - 10 reps - Clamshell  - 1 x daily - 7 x weekly - 2 sets - 10  reps - Sidelying Hip Abduction  - 1 x daily - 7 x weekly - 2 sets - 10 reps - Standing Heel Raise  - 1 x daily - 7 x weekly - 2 sets - 20 reps   ASSESSMENT:   CLINICAL IMPRESSION: Theodoro reported increased left knee pain secondary to tripping over a towel yesterday.  Pt was able to continue with skilled PT with making some adjustments to previous sessions secondary to hitting his left knee last night and having some increased soreness.  Pt with some UE support required with standing lunges with foot on slider for increased knee support.  Pt has met FOTO goal at this time with 59% noted today.  Patient continues to be on track for anticipated discharge next week, barring any new falls/injuries.   OBJECTIVE IMPAIRMENTS decreased activity tolerance, decreased balance,  difficulty walking, decreased ROM, decreased strength, increased edema, increased fascial restrictions, increased muscle spasms, impaired flexibility, and pain.    ACTIVITY LIMITATIONS carrying, lifting, bending, standing, squatting, and stairs   PARTICIPATION LIMITATIONS: cleaning, laundry, community activity, and occupation   PERSONAL FACTORS Past/current experiences, Time since onset of injury/illness/exacerbation, and 3+ comorbidities: multiple surgical interventions required following fall  are also affecting patient's functional outcome.    REHAB POTENTIAL: Good   CLINICAL DECISION MAKING: Evolving/moderate complexity   EVALUATION COMPLEXITY: Moderate     GOALS: Goals reviewed with patient? Yes   SHORT TERM GOALS: Target date: 09/22/2022  Pt will be independent with initial HEP. Baseline: Goal status: MET   2.  Pt will be able to participate in a TUG assessment with RW. Baseline:  Goal status: met both with and without AD     LONG TERM GOALS: Target date: 10/28/2022    Pt will be independent with advanced HEP. Baseline:  Goal status: ongoing   2.  Pt will report no increase in pain with ambulation with LRAD when walking around the grocery store. Baseline:  Goal status: met   3.  Pt will increase left knee A/ROM to 0 to 120 degrees to allow for easier navigation of stairs. Baseline:  Goal status: met   4.  Pt will increase left hip and hamstring strength to at least 5-/5 to allow him to navigate stairs with reciprocal pattern. Baseline:  Goal status: ongoing   5.  Pt will increase FOTO to at least 56% to demonstrate improvements in functional tasks. Baseline: 26% Goal status: MET on 10/17/2022   6.  Pt will be able to ambulate for at least 20 minutes with LRAD to allow him to go on easier hikes. Baseline:  Goal status: ongoing     PLAN: PT FREQUENCY: 2x/week   PT DURATION: 8 weeks   PLANNED INTERVENTIONS: Therapeutic exercises, Therapeutic activity,  Neuromuscular re-education, Balance training, Gait training, Patient/Family education, Self Care, Joint mobilization, Joint manipulation, Stair training, Aquatic Therapy, Dry Needling, Electrical stimulation, Spinal manipulation, Spinal mobilization, Cryotherapy, Moist heat, scar mobilization, Taping, Vasopneumatic device, Ionotophoresis 4mg /ml Dexamethasone, Manual therapy, and Re-evaluation   PLAN FOR NEXT SESSION: assess status of left knee following, continue LE strength, balance, gait endurance   Reather Laurence, PT 10/17/22 11:47 AM   [facility] [address] [place], [place] [address] Phone # [phone] Fax [phone]     PHYSICAL THERAPY DISCHARGE SUMMARY  As of 01/04/2023, pt has not returned for any further treatment sessions.  Pt was progressing well at last visit.  Patient agrees to discharge. Patient goals were partially met. Patient is being  discharged due to not returning since the last visit.  Kimyah Frein, PT 01/04/23 7:31 AM

## 2022-10-18 ENCOUNTER — Encounter: Payer: BC Managed Care – PPO | Admitting: Rehabilitative and Restorative Service Providers"

## 2022-10-20 ENCOUNTER — Ambulatory Visit: Payer: BC Managed Care – PPO | Admitting: Physical Therapy

## 2022-10-25 ENCOUNTER — Ambulatory Visit: Payer: BC Managed Care – PPO | Admitting: Rehabilitative and Restorative Service Providers"

## 2022-10-26 DIAGNOSIS — T79A22D Traumatic compartment syndrome of left lower extremity, subsequent encounter: Secondary | ICD-10-CM | POA: Diagnosis not present

## 2022-10-26 DIAGNOSIS — S82142D Displaced bicondylar fracture of left tibia, subsequent encounter for closed fracture with routine healing: Secondary | ICD-10-CM | POA: Diagnosis not present

## 2022-10-26 DIAGNOSIS — S82112D Displaced fracture of left tibial spine, subsequent encounter for closed fracture with routine healing: Secondary | ICD-10-CM | POA: Diagnosis not present

## 2022-10-27 ENCOUNTER — Ambulatory Visit: Payer: BC Managed Care – PPO | Admitting: Physical Therapy

## 2022-11-04 DIAGNOSIS — F410 Panic disorder [episodic paroxysmal anxiety] without agoraphobia: Secondary | ICD-10-CM | POA: Diagnosis not present

## 2022-11-04 DIAGNOSIS — F4010 Social phobia, unspecified: Secondary | ICD-10-CM | POA: Diagnosis not present

## 2022-12-01 ENCOUNTER — Ambulatory Visit (INDEPENDENT_AMBULATORY_CARE_PROVIDER_SITE_OTHER): Payer: BC Managed Care – PPO | Admitting: Orthopaedic Surgery

## 2022-12-01 ENCOUNTER — Telehealth: Payer: Self-pay | Admitting: Orthopaedic Surgery

## 2022-12-01 ENCOUNTER — Other Ambulatory Visit: Payer: Self-pay

## 2022-12-01 ENCOUNTER — Ambulatory Visit (INDEPENDENT_AMBULATORY_CARE_PROVIDER_SITE_OTHER): Payer: BC Managed Care – PPO

## 2022-12-01 DIAGNOSIS — M1732 Unilateral post-traumatic osteoarthritis, left knee: Secondary | ICD-10-CM | POA: Diagnosis not present

## 2022-12-01 DIAGNOSIS — L4050 Arthropathic psoriasis, unspecified: Secondary | ICD-10-CM

## 2022-12-01 NOTE — Telephone Encounter (Signed)
Patient states they need a referral for the Dermatologist can someone sent it to Dr. Linna Hoff [phone]

## 2022-12-01 NOTE — Progress Notes (Signed)
Office Visit Note   Patient: Ryan Lynch           Date of Birth: 03/17/1981           MRN: [id number] Visit Date: 12/01/2022              Requested by: Altamese Fillmore, MD [address] [place],  [place] [address] PCP: Patient, No Pcp Per   Assessment & Plan: Visit Diagnoses:  1. Psoriatic arthritis ([place])   2. Post-traumatic osteoarthritis of left knee     Plan: We had a long discussion about his knee pain.  I believe his pain is coming from posttraumatic arthritis as well as uncontrolled psoriasis/psoriatic arthritis.  We agree that the best course of action is to get the psoriasis under control first which could greatly improve his joint pain.  He understands that his age is not ideal for total knee replacement.  We would exhaust all conservative management before we went the surgical route.  For now he is going to make an appointment with his dermatologist so that he can get his psoriasis under control.  Follow-Up Instructions: No follow-ups on file.   Orders:  Orders Placed This Encounter  Procedures   XR Tibia/Fibula Left   No orders of the defined types were placed in this encounter.     Procedures: No procedures performed   Clinical Data: No additional findings.   Subjective: Chief Complaint  Patient presents with   Left Leg - Pain    HPI Pate is a 42 year old gentleman who is here for evaluation of left knee pain and stiffness.  Underwent ORIF left tibial plateau fracture in August of last year.  Unfortunately developed compartment syndrome as well.  He does have a history of psoriasis which is currently not being managed.  He has to keep the knee bent for pain relief.  He has pain with knee extension.  He is having trouble walking without a limp.  Wears a hinged knee brace.  Review of Systems  Constitutional: Negative.   HENT: Negative.    Eyes: Negative.   Respiratory: Negative.    Cardiovascular: Negative.   Gastrointestinal: Negative.   Endocrine:  Negative.   Genitourinary: Negative.   Skin: Negative.   Allergic/Immunologic: Negative.   Neurological: Negative.   Hematological: Negative.   Psychiatric/Behavioral: Negative.    All other systems reviewed and are negative.    Objective: Vital Signs: There were no vitals taken for this visit.  Physical Exam Vitals and nursing note reviewed.  Constitutional:      Appearance: He is well-developed.  HENT:     Head: Normocephalic and atraumatic.  Eyes:     Pupils: Pupils are equal, round, and reactive to light.  Pulmonary:     Effort: Pulmonary effort is normal.  Abdominal:     Palpations: Abdomen is soft.  Musculoskeletal:        General: Normal range of motion.     Cervical back: Neck supple.  Skin:    General: Skin is warm.  Neurological:     Mental Status: He is alert and oriented to person, place, and time.  Psychiatric:        Behavior: Behavior normal.        Thought Content: Thought content normal.        Judgment: Judgment normal.     Ortho Exam Examination of the left knee and lower leg shows widespread psoriatic plaques.  He has no joint effusion.  Collaterals and cruciates  are stable.  Range of motion causes moderate pain and discomfort.  Extensor mechanism intact. Specialty Comments:  No specialty comments available.  Imaging: XR Tibia/Fibula Left  Result Date: 12/01/2022 Generalized osteopenia.  Status post ORIF tibial plateau fracture with lateral plate and multiple screws.  Posttraumatic osteoarthritis    PMFS History: Patient Active Problem List   Diagnosis Date Noted   Generalized anxiety disorder    Closed nondisplaced fracture of left tibial plateau 07/16/2022   Multiple rib fractures 07/11/2022   Compression fracture of body of thoracic vertebra ([place]) 07/11/2022   Sepsis ([place]) 07/09/2022   Acute urinary retention 07/09/2022   Prolonged QT interval 07/09/2022   AKI (acute kidney injury) ([place]) 07/07/2022   Alcohol withdrawal ([place])  07/05/2022   Acute anemia 07/05/2022   Psoriasis 07/05/2022   Ileus ([place]) 37/16/9678   Acute metabolic encephalopathy 93/81/0175   Traumatic rhabdomyolysis ([place]) 07/03/2022   Observation after surgery 07/02/2022   Tibial plateau fracture, left 07/02/2022   Alcohol use 07/02/2022   Transaminitis 07/02/2022   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[place]) 07/02/2022   Adrenal nodule ([place]) 07/02/2022   Compression fracture of T7 vertebra ([place]) 07/02/2022   Thrombocytopenia ([place]) 07/02/2022   Anxiety 07/02/2022   No past medical history on file.  No family history on file.  Past Surgical History:  Procedure Laterality Date   ANTERIOR CRUCIATE LIGAMENT REPAIR Right    APPLICATION OF WOUND VAC Left 07/02/2022   Procedure: APPLICATION OF WOUND VAC;  Surgeon: Altamese [place], MD;  Location: [place];  Service: Orthopedics;  Laterality: Left;   APPLICATION OF WOUND VAC Left 07/05/2022   Procedure: APPLICATION OF WOUND VAC;  Surgeon: Altamese Kimmell, MD;  Location: [place];  Service: Orthopedics;  Laterality: Left;   CLOSED REDUCTION TIBIA Left 07/02/2022   Procedure: CLOSED REDUCTION TIBIA;  Surgeon: Altamese [place], MD;  Location: [place];  Service: Orthopedics;  Laterality: Left;   EXTERNAL FIXATION LEG Left 07/02/2022   Procedure: EXTERNAL FIXATION LEG;  Surgeon: Altamese [place], MD;  Location: [place];  Service: Orthopedics;  Laterality: Left;   FASCIOTOMY Left 07/02/2022   Procedure: FASCIOTOMY;  Surgeon: Altamese [place], MD;  Location: [place];  Service: Orthopedics;  Laterality: Left;   I & D EXTREMITY Left 07/07/2022   Procedure: IRRIGATION AND DEBRIDEMENT EXTREMITY;  Surgeon: Altamese [place], MD;  Location: [place];  Service: Orthopedics;  Laterality: Left;   ORIF TIBIA PLATEAU Left 07/05/2022   Procedure: OPEN REDUCTION INTERNAL FIXATION (ORIF) TIBIAL PLATEAU;  Surgeon: Altamese [place], MD;  Location: [place];  Service: Orthopedics;  Laterality: Left;   SECONDARY CLOSURE OF WOUND Left 07/05/2022   Procedure: SECONDARY  CLOSURE OF WOUND;  Surgeon: Altamese [place], MD;  Location: [place];  Service: Orthopedics;  Laterality: Left;   SECONDARY CLOSURE OF WOUND Left 07/07/2022   Procedure: SECONDARY CLOSURE OF WOUND;  Surgeon: Altamese , MD;  Location: [facility];  Service: Orthopedics;  Laterality: Left;   SHOULDER SURGERY Bilateral    Social History   Occupational History   Not on file  Tobacco Use   Smoking status: Never   Smokeless tobacco: Not on file  Substance and Sexual Activity   Alcohol use: Yes    Comment: occasional   Drug use: Never   Sexual activity: Not on file

## 2022-12-01 NOTE — Telephone Encounter (Signed)
Order has been placed.

## 2022-12-12 ENCOUNTER — Other Ambulatory Visit: Payer: Self-pay

## 2022-12-12 ENCOUNTER — Telehealth: Payer: Self-pay | Admitting: Orthopaedic Surgery

## 2022-12-12 DIAGNOSIS — L4050 Arthropathic psoriasis, unspecified: Secondary | ICD-10-CM

## 2022-12-12 NOTE — Telephone Encounter (Signed)
[facility] call patient about sending a referral to [place] call patient ASAP.

## 2022-12-12 NOTE — Telephone Encounter (Signed)
Called. No answer. LMOM that we are placing a referral for rheumatology as well.

## 2022-12-12 NOTE — Telephone Encounter (Signed)
Yes as well please.  Thanks.

## 2023-03-19 NOTE — Progress Notes (Unsigned)
Office Visit Note  Patient: Ryan Lynch             Date of Birth: 22-Aug-1981           MRN: [id number]             PCP: Patient, No Pcp Per Referring: Tarry Kos, MD Visit Date: 03/20/2023 Occupation: @GUAROCC @  Subjective:  No chief complaint on file.   History of Present Illness: Ryan Lynch is a 41 y.o. male here for psoriatic arthritis with joint pain particularly left knee which also has postraumatic arthritis after injury last year ***     Activities of Daily Living:  Patient reports morning stiffness for *** {minute/hour:19697}.   Patient {ACTIONS;DENIES/REPORTS:21021675::"Denies"} nocturnal pain.  Difficulty dressing/grooming: {ACTIONS;DENIES/REPORTS:21021675::"Denies"} Difficulty climbing stairs: {ACTIONS;DENIES/REPORTS:21021675::"Denies"} Difficulty getting out of chair: {ACTIONS;DENIES/REPORTS:21021675::"Denies"} Difficulty using hands for taps, buttons, cutlery, and/or writing: {ACTIONS;DENIES/REPORTS:21021675::"Denies"}  No Rheumatology ROS completed.   PMFS History:  Patient Active Problem List   Diagnosis Date Noted   Generalized anxiety disorder    Closed nondisplaced fracture of left tibial plateau 07/16/2022   Multiple rib fractures 07/11/2022   Compression fracture of body of thoracic vertebra ([name]) 07/11/2022   Sepsis ([name]) 07/09/2022   Acute urinary retention 07/09/2022   Prolonged QT interval 07/09/2022   AKI (acute kidney injury) ([name]) 07/07/2022   Alcohol withdrawal ([name]) 07/05/2022   Acute anemia 07/05/2022   Psoriasis 07/05/2022   Ileus ([name]) 07/04/2022   Acute metabolic encephalopathy 07/04/2022   Traumatic rhabdomyolysis ([name]) 07/03/2022   Observation after surgery 07/02/2022   Tibial plateau fracture, left 07/02/2022   Alcohol use 07/02/2022   Transaminitis 07/02/2022   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[name]) 07/02/2022   Adrenal nodule ([name]) 07/02/2022   Compression fracture of T7 vertebra ([name]) 07/02/2022   Thrombocytopenia ([name])  07/02/2022   Anxiety 07/02/2022    No past medical history on file.  No family history on file. Past Surgical History:  Procedure Laterality Date   ANTERIOR CRUCIATE LIGAMENT REPAIR Right    APPLICATION OF WOUND VAC Left 07/02/2022   Procedure: APPLICATION OF WOUND VAC;  Surgeon: Myrene Galas, MD;  Location: [facility] OR;  Service: Orthopedics;  Laterality: Left;   APPLICATION OF WOUND VAC Left 07/05/2022   Procedure: APPLICATION OF WOUND VAC;  Surgeon: Myrene Galas, MD;  Location: [facility] OR;  Service: Orthopedics;  Laterality: Left;   CLOSED REDUCTION TIBIA Left 07/02/2022   Procedure: CLOSED REDUCTION TIBIA;  Surgeon: Myrene Galas, MD;  Location: [facility] OR;  Service: Orthopedics;  Laterality: Left;   EXTERNAL FIXATION LEG Left 07/02/2022   Procedure: EXTERNAL FIXATION LEG;  Surgeon: Myrene Galas, MD;  Location[facility] OR;  Service: Orthopedics;  Laterality: Left;   FASCIOTOMY Left 07/02/2022   Procedure: FASCIOTOMY;  Surgeon: Myrene Galas, MD;  Location: [facility] OR;  Service: Orthopedics;  Laterality: Left;   I & D EXTREMITY Left 07/07/2022   Procedure: IRRIGATION AND DEBRIDEMENT EXTREMITY;  Surgeon: Myrene Galas, MD;  Location: [facility] OR;  Service: Orthopedics;  Laterality: Left;   ORIF TIBIA PLATEAU Left 07/05/2022   Procedure: OPEN REDUCTION INTERNAL FIXATION (ORIF) TIBIAL PLATEAU;  Surgeon: Myrene Galas, MD;  Location: [facility] OR;  Service: Orthopedics;  Laterality: Left;   SECONDARY CLOSURE OF WOUND Left 07/05/2022   Procedure: SECONDARY CLOSURE OF WOUND;  Surgeon: Myrene Galas, MD;  Location: [facility] OR;  Service: Orthopedics;  Laterality: Left;   SECONDARY CLOSURE OF WOUND Left 07/07/2022   Procedure: SECONDARY CLOSURE OF WOUND;  Surgeon: Myrene Galas, MD;  Location: [facility] OR;  Service: Orthopedics;  Laterality: Left;   SHOULDER SURGERY Bilateral    Social History   Social History Narrative   Not on file    There is no immunization history on file for this patient.   Objective: Vital Signs: There were  no vitals taken for this visit.   Physical Exam   Musculoskeletal Exam: ***  CDAI Exam: CDAI Score: -- Patient Global: --; Provider Global: -- Swollen: --; Tender: -- Joint Exam 03/20/2023   No joint exam has been documented for this visit   There is currently no information documented on the homunculus. Go to the Rheumatology activity and complete the homunculus joint exam.  Investigation: No additional findings.  Imaging: No results found.  Recent Labs: Lab Results  Component Value Date   WBC 4.2 07/18/2022   HGB 8.6 (L) 07/18/2022   PLT 155 07/18/2022   NA 136 07/18/2022   K 3.8 07/18/2022   CL 107 07/18/2022   CO2 22 07/18/2022   GLUCOSE 110 (H) 07/18/2022   BUN 8 07/18/2022   CREATININE 0.87 07/18/2022   BILITOT 1.3 (H) 07/18/2022   ALKPHOS 407 (H) 07/18/2022   AST 30 07/18/2022   ALT 24 07/18/2022   PROT 6.3 (L) 07/18/2022   ALBUMIN 2.7 (L) 07/18/2022   CALCIUM 8.5 (L) 07/18/2022    Speciality Comments: No specialty comments available.  Procedures:  No procedures performed Allergies: Patient has no known allergies.   Assessment / Plan:     Visit Diagnoses: No diagnosis found.  Orders: No orders of the defined types were placed in this encounter.  No orders of the defined types were placed in this encounter.   Face-to-face time spent with patient was *** minutes. Greater than 50% of time was spent in counseling and coordination of care.  Follow-Up Instructions: No follow-ups on file.   Fuller Plan, MD  Note - This record has been created using [employer].  Chart creation errors have been sought, but may not always  have been located. Such creation errors do not reflect on  the standard of medical care.

## 2023-03-20 ENCOUNTER — Ambulatory Visit: Payer: BC Managed Care – PPO | Attending: Internal Medicine | Admitting: Internal Medicine

## 2023-03-20 ENCOUNTER — Encounter: Payer: Self-pay | Admitting: Internal Medicine

## 2023-03-20 VITALS — BP 116/76 | HR 99 | Resp 16 | Ht 70.0 in | Wt 164.0 lb

## 2023-03-20 DIAGNOSIS — Z1159 Encounter for screening for other viral diseases: Secondary | ICD-10-CM | POA: Diagnosis not present

## 2023-03-20 DIAGNOSIS — S82145S Nondisplaced bicondylar fracture of left tibia, sequela: Secondary | ICD-10-CM

## 2023-03-20 DIAGNOSIS — Z79899 Other long term (current) drug therapy: Secondary | ICD-10-CM

## 2023-03-20 DIAGNOSIS — L4050 Arthropathic psoriasis, unspecified: Secondary | ICD-10-CM | POA: Diagnosis not present

## 2023-03-20 DIAGNOSIS — E559 Vitamin D deficiency, unspecified: Secondary | ICD-10-CM

## 2023-03-20 DIAGNOSIS — L409 Psoriasis, unspecified: Secondary | ICD-10-CM

## 2023-03-20 NOTE — Patient Instructions (Signed)
Secukinumab Injection What is this medication? SECUKINUMAB (sek ue KIN ue mab) treats autoimmune conditions, such as psoriasis and arthritis. It works by slowing down an overactive immune system. It is a monoclonal antibody. This medicine may be used for other purposes; ask your health care provider or pharmacist if you have questions. COMMON BRAND NAME(S): Cosentyx What should I tell my care team before I take this medication? They need to know if you have any of these conditions: Crohn's disease, ulcerative colitis, or other inflammatory bowel disease Immune system problems Infection or history of infection, such as a viral infection, chickenpox, cold sores, or herpes Recently received or are scheduled to receive a vaccine Tuberculosis, a positive skin test for tuberculosis, or recent close contact with someone who has tuberculosis An unusual or allergic reaction to secukinumab, latex, rubber, other medications, foods, dyes, or preservatives Pregnant or trying to get pregnant Breast-feeding How should I use this medication? This medication is injected into a vein or under the skin. It is given by your care team in a hospital or clinic setting if it is injected into a vein. If it is injected under the skin, it may be given at home. If you get this medication at home, you will be taught how to prepare and give it. Use it exactly as directed. Take it as directed on the prescription label. Keep taking it unless your care team tells you to stop. It is important that you put your used needles and syringes in a special sharps container. Do not put them in a trash can. If you do not have a sharps container, call your pharmacist or care team to get one. A special MedGuide will be given to you by the pharmacist with each prescription and refill. Be sure to read this information carefully each time. Talk to your care team about the use of this medication in children. While it may be prescribed for  children as young as 2 years for selected conditions, precautions do apply. Overdosage: If you think you have taken too much of this medicine contact a poison control center or emergency room at once. NOTE: This medicine is only for you. Do not share this medicine with others. What if I miss a dose? If you get this medication at the hospital or clinic: It is important not to miss your dose. Call your care team if you are unable to keep an appointment. If you give yourself this medication at home: If you miss a dose, take it as soon as you can. If it is almost time for your next dose, take only that dose. Do not take double or extra doses. Call your care team with questions. What may interact with this medication? Live virus vaccines This list may not describe all possible interactions. Give your health care provider a list of all the medicines, herbs, non-prescription drugs, or dietary supplements you use. Also tell them if you smoke, drink alcohol, or use illegal drugs. Some items may interact with your medicine. What should I watch for while using this medication? Visit your care team for regular checks on your progress. Tell your care team if your symptoms do not start to get better or if they get worse. You will be tested for tuberculosis (TB) before you start this medication. If your care team prescribes any medication for TB, you should start taking the TB medication before starting this medication. Make sure to finish the full course of TB medication. This medication may increase your   risk of getting an infection. Call your care team for advice if you get a fever, chills, sore throat, or other symptoms of a cold or flu. Do not treat yourself. Try to avoid being around people who are sick. This medication can decrease the response to a vaccine. If you need to get vaccinated, tell your care team if you have received this medication within the last 6 months. Extra booster doses may be needed. Talk  to your care team to see if a different vaccination schedule is needed. What side effects may I notice from receiving this medication? Side effects that you should report to your care team as soon as possible: Allergic reactions--skin rash, itching, hives, swelling of the face, lips, tongue, or throat Dry, itchy, scaly patches of skin that blister or peel Infection--fever, chills, cough, sore throat, wounds that don't heal, pain or trouble when passing urine, general feeling of discomfort or being unwell Sudden or severe stomach pain, bloody diarrhea, fever, nausea, vomiting Side effects that usually do not require medical attention (report these to your care team if they continue or are bothersome): Diarrhea Runny or stuffy nose Sore throat This list may not describe all possible side effects. Call your doctor for medical advice about side effects. You may report side effects to FDA at 1-800-FDA-1088. Where should I keep my medication? Keep out of the reach of children and pets. Store in the refrigerator. Do not freeze. Keep it in the original carton until you are ready to use it. Protect from light. Do not shake. Remove the dose from the refrigerator about 30 minutes before it is time for you to use it. Use it within 4 days of removing it from the carton. Get rid of any unused medication after the expiration date. To get rid of medications that are no longer needed or have expired: Take the medication to a medication take-back program. Check with your pharmacy or law enforcement to find a location. If you cannot return the medication, ask your pharmacist or care team how to get rid of this medication safely. NOTE: This sheet is a summary. It may not cover all possible information. If you have questions about this medicine, talk to your doctor, pharmacist, or health care provider.  2023 Elsevier/Gold Standard (2022-01-19 00:00:00)  

## 2023-03-21 ENCOUNTER — Telehealth: Payer: Self-pay | Admitting: Pharmacist

## 2023-03-21 DIAGNOSIS — L409 Psoriasis, unspecified: Secondary | ICD-10-CM

## 2023-03-21 DIAGNOSIS — L4050 Arthropathic psoriasis, unspecified: Secondary | ICD-10-CM

## 2023-03-21 DIAGNOSIS — Z79899 Other long term (current) drug therapy: Secondary | ICD-10-CM

## 2023-03-21 NOTE — Telephone Encounter (Addendum)
Please start COSENTYX UNOREADY SQ BIV pending baselne labs  Dose: 300mg  SQ at Weeks 0 , 1, 2, 3, 4 then 300mg  SQ every 4 weeks thereafter  Dx: Psoriatic arthritis (L40.5) and Psoriasis (L40.9)  Chesley Mires, PharmD, MPH, BCPS, CPP Clinical Pharmacist (Rheumatology and Pulmonology)  ----- Message from Metta Clines, RT sent at 03/20/2023  8:59 AM EDT ----- Regarding: New Start Cosentyx New start after labs come back.

## 2023-03-22 NOTE — Telephone Encounter (Signed)
Submitted a Prior Authorization request to [facility] for COSENTYX via CoverMyMeds. Will update once we receive a response.  Key: Carley Hammed, PharmD, MPH, BCPS, CPP Clinical Pharmacist (Rheumatology and Pulmonology)

## 2023-03-23 LAB — C-REACTIVE PROTEIN: CRP: <3 mg/L (ref <8.0)

## 2023-03-23 LAB — COMPLETE METABOLIC PANEL WITH GFR
AG Ratio: 2 (calc) (ref 1.0–2.5)
ALT: 48 U/L — ABNORMAL HIGH (ref 9–46)
AST: 31 U/L (ref 10–40)
Albumin: 4.9 g/dL (ref 3.6–5.1)
Alkaline phosphatase (APISO): 104 U/L (ref 36–130)
BUN: 22 mg/dL (ref 7–25)
CO2: 26 mmol/L (ref 20–32)
Calcium: 10.1 mg/dL (ref 8.6–10.3)
Chloride: 107 mmol/L (ref 98–110)
Creat: 0.91 mg/dL (ref 0.60–1.29)
Globulin: 2.4 g/dL (calc) (ref 1.9–3.7)
Glucose, Bld: 81 mg/dL (ref 65–99)
Potassium: 4.4 mmol/L (ref 3.5–5.3)
Sodium: 142 mmol/L (ref 135–146)
Total Bilirubin: 0.5 mg/dL (ref 0.2–1.2)
Total Protein: 7.3 g/dL (ref 6.1–8.1)
eGFR: 107 mL/min/{1.73_m2} (ref >=60)

## 2023-03-23 LAB — CBC WITH DIFFERENTIAL/PLATELET
Absolute Monocytes: 270 cells/uL (ref 200–950)
Basophils Absolute: 40 cells/uL (ref 0–200)
Basophils Relative: 0.8 %
Eosinophils Absolute: 60 cells/uL (ref 15–500)
Eosinophils Relative: 1.2 %
HCT: 41.3 % (ref 38.5–50.0)
Hemoglobin: 13.8 g/dL (ref 13.2–17.1)
Lymphs Abs: 1440 cells/uL (ref 850–3900)
MCH: 29.9 pg (ref 27.0–33.0)
MCHC: 33.4 g/dL (ref 32.0–36.0)
MCV: 89.4 fL (ref 80.0–100.0)
Monocytes Relative: 5.4 %
Neutro Abs: 3190 cells/uL (ref 1500–7800)
Neutrophils Relative %: 63.8 %
RBC: 4.62 10*6/uL (ref 4.20–5.80)
RDW: 15.6 % — ABNORMAL HIGH (ref 11.0–15.0)
Total Lymphocyte: 28.8 %
WBC: 5 10*3/uL (ref 3.8–10.8)

## 2023-03-23 LAB — QUANTIFERON-TB GOLD PLUS
Mitogen-NIL: 5.44 IU/mL
NIL: 0.01 IU/mL
QuantiFERON-TB Gold Plus: NEGATIVE
TB1-NIL: 0 IU/mL
TB2-NIL: 0 IU/mL

## 2023-03-23 LAB — HEPATITIS B CORE ANTIBODY, IGM: Hep B C IgM: NONREACTIVE

## 2023-03-23 LAB — HEPATITIS B SURFACE ANTIGEN: Hepatitis B Surface Ag: NONREACTIVE

## 2023-03-23 LAB — VITAMIN D 25 HYDROXY (VIT D DEFICIENCY, FRACTURES): Vit D, 25-Hydroxy: 36 ng/mL (ref 30–100)

## 2023-03-23 LAB — SEDIMENTATION RATE: Sed Rate: 2 mm/h (ref 0–15)

## 2023-03-24 NOTE — Progress Notes (Signed)
Lab results look fine for starting cosentyx as planned. His ALT is minimally elevated at 48. Blood count is normal. Sed rate and CRP are normal. Vitamin D improved to normal. Screening for hepatitis and tuberculosis are negative.

## 2023-03-28 ENCOUNTER — Other Ambulatory Visit (HOSPITAL_COMMUNITY): Payer: Self-pay

## 2023-03-28 NOTE — Telephone Encounter (Signed)
Received notification from [facility] regarding a prior authorization for COSENTYX. Authorization has been APPROVED from 03/22/23 to 03/21/24. Approval letter sent to scan center.  Per test claim, copay for 28 days supply is [phone]  Patient can fill through [facility]: [phone]   Authorization # [id number] Phone # [phone]  Enrolled patient into Cosentyx copay card (covers $16000 annually) BIN: [id number] PCN: [place]CP GRP: [id number] ID: [id number]  He can be scheduled for new start visit and rx couriered from [facility] to clinic  Chesley Mires, PharmD, MPH, BCPS, CPP Clinical Pharmacist (Rheumatology and Pulmonology)

## 2023-04-03 NOTE — Telephone Encounter (Signed)
ATC patient regarding Cosentyx new start. Unable to reach. Left VM requesting return call  Chesley Mires, PharmD, MPH, BCPS, CPP Clinical Pharmacist (Rheumatology and Pulmonology)

## 2023-04-05 ENCOUNTER — Other Ambulatory Visit: Payer: Self-pay

## 2023-04-05 ENCOUNTER — Other Ambulatory Visit (HOSPITAL_COMMUNITY): Payer: Self-pay

## 2023-04-05 MED ORDER — COSENTYX UNOREADY 300 MG/2ML ~~LOC~~ SOAJ
300.0000 mg | SUBCUTANEOUS | 0 refills | Status: DC
  Filled 2023-04-05: qty 8, fill #0
  Filled 2023-04-05: qty 8, 28d supply, fill #0

## 2023-04-05 NOTE — Telephone Encounter (Signed)
Delivery instructions have been updated in [facility], medication will be couriered to [facility] on 04/06/23.

## 2023-04-05 NOTE — Telephone Encounter (Signed)
Spoke with patient. Rx has been sent to [facility] for Cosentyx. Routing to [place] [facility] for onboarding needs  He is scheduled for Cosentyx new start on 04/12/23  Chesley Mires, PharmD, MPH, BCPS, CPP Clinical Pharmacist (Rheumatology and Pulmonology)

## 2023-04-06 ENCOUNTER — Other Ambulatory Visit: Payer: Self-pay

## 2023-04-06 ENCOUNTER — Other Ambulatory Visit (HOSPITAL_COMMUNITY): Payer: Self-pay

## 2023-04-07 ENCOUNTER — Other Ambulatory Visit (HOSPITAL_COMMUNITY): Payer: Self-pay

## 2023-04-11 ENCOUNTER — Other Ambulatory Visit (HOSPITAL_COMMUNITY): Payer: Self-pay

## 2023-04-11 NOTE — Telephone Encounter (Signed)
Cosentyx received from [facility]. Placed in fridge  Chesley Mires, PharmD, MPH, BCPS, CPP Clinical Pharmacist (Rheumatology and Pulmonology)

## 2023-04-12 ENCOUNTER — Ambulatory Visit: Payer: BC Managed Care – PPO | Attending: Internal Medicine | Admitting: Pharmacist

## 2023-04-12 ENCOUNTER — Other Ambulatory Visit (HOSPITAL_COMMUNITY): Payer: Self-pay

## 2023-04-12 ENCOUNTER — Other Ambulatory Visit: Payer: Self-pay

## 2023-04-12 DIAGNOSIS — Z7189 Other specified counseling: Secondary | ICD-10-CM

## 2023-04-12 DIAGNOSIS — L409 Psoriasis, unspecified: Secondary | ICD-10-CM

## 2023-04-12 DIAGNOSIS — L4050 Arthropathic psoriasis, unspecified: Secondary | ICD-10-CM

## 2023-04-12 DIAGNOSIS — Z79899 Other long term (current) drug therapy: Secondary | ICD-10-CM

## 2023-04-12 MED ORDER — COSENTYX UNOREADY 300 MG/2ML ~~LOC~~ SOAJ
300.0000 mg | SUBCUTANEOUS | 1 refills | Status: DC
Start: 2023-04-12 — End: 2023-06-06
  Filled 2023-04-12: qty 2, fill #0
  Filled 2023-05-03: qty 2, 28d supply, fill #0

## 2023-04-12 NOTE — Progress Notes (Signed)
Pharmacy Note  Subjective:   Patient presents to clinic today to receive first dose of COSENTYX for psoriatic arthritis and psoriasis. He is accompanied by his father in case he needs to help with injection in back of arm. Patient currently takes ibuprofen prn. He is naive to biologic DMARDs. He had a fall down stairs in August 2023 leading to vertebral fracture with approximately 20% height loss as well as tibial fracture.   Patient running a fever or have signs/symptoms of infection? No  Patient currently on antibiotics for the treatment of infection? No  Patient have any upcoming invasive procedures/surgeries? No  Objective: CMP     Component Value Date/Time   NA 142 03/20/2023 0855   K 4.4 03/20/2023 0855   CL 107 03/20/2023 0855   CO2 26 03/20/2023 0855   GLUCOSE 81 03/20/2023 0855   BUN 22 03/20/2023 0855   CREATININE 0.91 03/20/2023 0855   CALCIUM 10.1 03/20/2023 0855   PROT 7.3 03/20/2023 0855   ALBUMIN 2.7 (L) 07/18/2022 0516   AST 31 03/20/2023 0855   ALT 48 (H) 03/20/2023 0855   ALKPHOS 407 (H) 07/18/2022 0516   BILITOT 0.5 03/20/2023 0855   GFRNONAA >60 07/18/2022 0516    CBC    Component Value Date/Time   WBC 5.0 03/20/2023 0855   RBC 4.62 03/20/2023 0855   HGB 13.8 03/20/2023 0855   HCT 41.3 03/20/2023 0855   PLT CANCELED 03/20/2023 0855   MCV 89.4 03/20/2023 0855   MCH 29.9 03/20/2023 0855   MCHC 33.4 03/20/2023 0855   RDW 15.6 (H) 03/20/2023 0855   LYMPHSABS 1,440 03/20/2023 0855   MONOABS 0.4 07/18/2022 0516   EOSABS 60 03/20/2023 0855   BASOSABS 40 03/20/2023 0855    Baseline Immunosuppressant Therapy Labs TB GOLD    Latest Ref Rng & Units 03/20/2023    8:55 AM  Quantiferon TB Gold  Quantiferon TB Gold Plus NEGATIVE NEGATIVE    Hepatitis Panel    Latest Ref Rng & Units 03/20/2023    8:55 AM  Hepatitis  Hep B Surface Ag NON-REACTIVE NON-REACTIVE   Hep B IgM NON-REACTIVE NON-REACTIVE   HCV Ab - non reactive on 07/02/22  SPEP    Latest  Ref Rng & Units 03/20/2023    8:55 AM  Serum Protein Electrophoresis  Total Protein 6.1 - 8.1 g/dL 7.3    Chest x-ray: 6/57/84 - lightly increased bilateral LOWER lung opacities which may represent atelectasis versus airspace disease/pneumonia. Enteric tube with tip overlying the proximal stomach and side hole overlying the distal esophagus-recommend advancement.  Assessment/Plan:  Reviewed importance of holding COSENTYX with signs/symptoms of an infections, if antibiotics are prescribed to treat an active infection, and with invasive procedures  Demonstrated proper injection technique with COSENTYX demo device  Patient able to demonstrate proper injection technique using the teach back method. Reviewed alternating injection sites to minimize risk of reactions.  Patient self injected in the right lower abdomen with:  [facility]-supplied Medication: COSENTYX UNOREADY 300mg /29mL NDC: [address]-13 Lot: [id number] Expiration: 09/2024  Patient tolerated well.  Observed for 30 mins in office for adverse reaction and none noted. Patient denies itchiness and irritation at injection site. NO redness or swelling noted   Patient is to return in 1 month for labs and 6-8 weeks for follow-up appointment.  Standing orders for CBC/CMP placed.  TB gold will be monitored yearly.   COSENTYX approved through insurance .   Rx sent to: [facility]: [phone] .  Patient provided with pharmacy phone number and advised to call later this week to schedule shipment to home.  Patient will continue COSENTYX 300mg  SQ at Weeks 0 (administered in clinic today), weeks 1, 2, 3, 4 then 300mg  SQ every 4 weeks thereafter.   All questions encouraged and answered.  Instructed patient to call with any further questions or concerns.  Chesley Mires, PharmD, MPH, BCPS, CPP Clinical Pharmacist (Rheumatology and Pulmonology)  04/12/2023 8:11 AM

## 2023-04-12 NOTE — Patient Instructions (Signed)
Your next COSENTYX dose is due on 03/19/23, 03/26/23, 04/02/23, 04/09/23 then every 4 weeks thereafter (starting on 06/07/23)  HOLD COSENTYX if you have signs or symptoms of an infection. You can resume once you feel better or back to your baseline. HOLD COSENTYX if you start antibiotics to treat an infection. HOLD COSENTYX around the time of surgery/procedures. Your surgeon will be able to provide recommendations on when to hold BEFORE and when you are cleared to RESUME.  Pharmacy information: Your prescription will be shipped from [facility]. Their phone number is [phone] They will call to schedule shipment and confirm address. They will mail your medication to your home.  Cost information: Your copay should be affordable. If you call the pharmacy and it is not affordable, please double-check that they are billing through your copay card as secondary coverage. That copay card information is: BIN: [id number] PCN: OHCP GRP: [id number] ID: [id number]  Labs are due in 1 month then every 3 months. Lab hours are from Monday to Thursday 8am-12:30pm and 1pm-5pm and Friday 8am-12pm. You do not need an appointment if you come for labs during these times.  How to manage an injection site reaction: Remember the 5 C's: COUNTER - leave on the counter at least 30 minutes but up to overnight to bring medication to room temperature. This may help prevent stinging COLD - place something cold (like an ice gel pack or cold water bottle) on the injection site just before cleansing with alcohol. This may help reduce pain CLARITIN - use Claritin (generic name is loratadine) for the first two weeks of treatment or the day of, the day before, and the day after injecting. This will help to minimize injection site reactions CORTISONE CREAM - apply if injection site is irritated and itching CALL ME - if injection site reaction is bigger than the size of your fist, looks infected, blisters, or if  you develop hives

## 2023-04-25 DIAGNOSIS — F410 Panic disorder [episodic paroxysmal anxiety] without agoraphobia: Secondary | ICD-10-CM | POA: Diagnosis not present

## 2023-04-25 DIAGNOSIS — F4010 Social phobia, unspecified: Secondary | ICD-10-CM | POA: Diagnosis not present

## 2023-04-26 ENCOUNTER — Other Ambulatory Visit (HOSPITAL_COMMUNITY): Payer: Self-pay

## 2023-05-03 ENCOUNTER — Other Ambulatory Visit: Payer: Self-pay

## 2023-05-03 ENCOUNTER — Other Ambulatory Visit (HOSPITAL_COMMUNITY): Payer: Self-pay

## 2023-05-04 ENCOUNTER — Other Ambulatory Visit: Payer: Self-pay

## 2023-05-04 ENCOUNTER — Other Ambulatory Visit (HOSPITAL_COMMUNITY): Payer: Self-pay

## 2023-05-07 ENCOUNTER — Other Ambulatory Visit: Payer: Self-pay

## 2023-05-07 ENCOUNTER — Encounter (HOSPITAL_BASED_OUTPATIENT_CLINIC_OR_DEPARTMENT_OTHER): Payer: Self-pay | Admitting: *Deleted

## 2023-05-07 ENCOUNTER — Emergency Department (HOSPITAL_BASED_OUTPATIENT_CLINIC_OR_DEPARTMENT_OTHER)
Admission: EM | Admit: 2023-05-07 | Discharge: 2023-05-08 | Disposition: A | Discharge status: Home / Self Care | Payer: BC Managed Care – PPO | Attending: Emergency Medicine | Admitting: Emergency Medicine

## 2023-05-07 DIAGNOSIS — K047 Periapical abscess without sinus: Secondary | ICD-10-CM

## 2023-05-07 DIAGNOSIS — R9431 Abnormal electrocardiogram [ECG] [EKG]: Secondary | ICD-10-CM | POA: Diagnosis not present

## 2023-05-07 DIAGNOSIS — K0889 Other specified disorders of teeth and supporting structures: Secondary | ICD-10-CM | POA: Diagnosis not present

## 2023-05-07 MED ORDER — CLINDAMYCIN HCL 150 MG PO CAPS
300.0000 mg | ORAL_CAPSULE | Freq: Once | ORAL | Status: AC
  Administered 2023-05-07: 300 mg via ORAL
  Filled 2023-05-07: qty 2

## 2023-05-07 MED ORDER — HYDROCODONE-ACETAMINOPHEN 5-325 MG PO TABS
1.0000 | ORAL_TABLET | Freq: Once | ORAL | Status: AC
  Administered 2023-05-07: 1 via ORAL
  Filled 2023-05-07: qty 1

## 2023-05-07 MED ORDER — CLINDAMYCIN HCL 300 MG PO CAPS: 300.0000 mg | ORAL_CAPSULE | Freq: Three times a day (TID) | ORAL | 0 refills | Status: AC

## 2023-05-07 NOTE — ED Provider Notes (Signed)
Ryan Lynch EMERGENCY DEPARTMENT AT [facility] Provider Note   CSN: [id number] Arrival date & time: 05/07/23  2307     History  Chief Complaint  Patient presents with   Dental Pain    Ryan Lynch is a 42 y.o. male.  Patient complains of a tooth ache.  Patient reports that he has a problem with his right lower wisdom tooth.  Patient reports the tooth did not grow and straight.  Patient reports the area has been swelling and it hurts to talk.  Patient has a history of psoriatic arthritis.  He is currently on Cosentyx.  Patient reports that the pain tonight was so bad that he became hot and felt like he was passing out.  Patient states he hit his left knee.  Patient does not think that he has broken anything he did not strike his head  The history is provided by the patient and a relative.  Dental Pain      Home Medications Prior to Admission medications   Medication Sig Start Date End Date Taking? Authorizing Provider  gabapentin (NEURONTIN) 600 MG tablet Take 600 mg by mouth 3 (three) times daily. 03/16/23   [provider]  Secukinumab (COSENTYX UNOREADY) 300 MG/2ML SOAJ Inject 300 mg into the skin every 28 (twenty-eight) days. On 04/09/23 then every 4 weeks thereafter 04/12/23   Fuller Plan, MD  sertraline (ZOLOFT) 100 MG tablet Take 1 tablet (100 mg total) by mouth daily. 07/22/22   Angiulli, Mcarthur Rossetti, PA-C  vitamin D3 (CHOLECALCIFEROL) 25 MCG tablet Take 2 tablets (2,000 Units total) by mouth 2 (two) times daily. Patient not taking: Reported on 03/20/2023 07/22/22   Angiulli, Mcarthur Rossetti, PA-C      Allergies    Timothy grass pollen allergen    Review of Systems   Review of Systems  All other systems reviewed and are negative.   Physical Exam Updated Vital Signs BP (!) 140/90 (BP Location: Right Arm)   Pulse (!) 106   Temp 98.7 F (37.1 C)   Resp 20   Ht 5\' 10"  (1.778 m)   Wt 77.1 kg   SpO2 99%   BMI 24.39 kg/m  Physical Exam Vitals and nursing  note reviewed.  Constitutional:      Appearance: He is well-developed.  HENT:     Head: Normocephalic.  Cardiovascular:     Rate and Rhythm: Normal rate.  Pulmonary:     Effort: Pulmonary effort is normal.  Abdominal:     General: There is no distension.  Musculoskeletal:     Cervical back: Normal range of motion.  Neurological:     Mental Status: He is alert and oriented to person, place, and time.     ED Results / Procedures / Treatments   Labs (all labs ordered are listed, but only abnormal results are displayed) Labs Reviewed - No data to display  EKG None  Radiology No results found.  Procedures Procedures    Medications Ordered in ED Medications  clindamycin (CLEOCIN) capsule 300 mg (has no administration in time range)  HYDROcodone-acetaminophen (NORCO/VICODIN) 5-325 MG per tablet 1 tablet (has no administration in time range)    ED Course/ Medical Decision Making/ A&P                             Medical Decision Making Patient complains of swelling and pain around his right lower third molar.  Patient is here with his  father who is supportive  Risk Prescription drug management. Risk Details: Patient advised to follow-up with his dentist.  Patient is given a prescription for clindamycin patient is given a dose of clindamycin here and a dose of hydrocodone.  Patient will try to see his dentist tomorrow.           Final Clinical Impression(s) / ED Diagnoses Final diagnoses:  Dental infection    Rx / DC Orders ED Discharge Orders          Ordered    clindamycin (CLEOCIN) 300 MG capsule  3 times daily        05/07/23 2344          An After Visit Summary was printed and given to the patient.     Osie Cheeks 05/07/23 2346    Pollyann Savoy, MD 05/08/23 [phone]

## 2023-05-07 NOTE — ED Triage Notes (Signed)
Pt states that he needed his wisdom teeth out,but had some health issues and not able to get this done. C/o right swelling to the right side of his face. States he had a syncopal episode tonight because of the pain, twisting his left knee. Tylenol and ibuprofen at 2030

## 2023-05-07 NOTE — Discharge Instructions (Addendum)
Call your dentist to be seen for evaluation  

## 2023-05-23 HISTORY — PX: WISDOM TOOTH EXTRACTION: SHX21

## 2023-05-31 ENCOUNTER — Other Ambulatory Visit (HOSPITAL_COMMUNITY): Payer: Self-pay

## 2023-06-06 ENCOUNTER — Other Ambulatory Visit (HOSPITAL_COMMUNITY): Payer: Self-pay

## 2023-06-06 ENCOUNTER — Encounter: Payer: Self-pay | Admitting: Internal Medicine

## 2023-06-06 ENCOUNTER — Ambulatory Visit: Payer: BC Managed Care – PPO | Attending: Internal Medicine | Admitting: Internal Medicine

## 2023-06-06 ENCOUNTER — Other Ambulatory Visit: Payer: Self-pay

## 2023-06-06 VITALS — BP 154/103 | HR 73 | Resp 14 | Ht 70.0 in | Wt 162.0 lb

## 2023-06-06 DIAGNOSIS — S82145S Nondisplaced bicondylar fracture of left tibia, sequela: Secondary | ICD-10-CM

## 2023-06-06 DIAGNOSIS — L4050 Arthropathic psoriasis, unspecified: Secondary | ICD-10-CM

## 2023-06-06 DIAGNOSIS — L409 Psoriasis, unspecified: Secondary | ICD-10-CM | POA: Diagnosis not present

## 2023-06-06 DIAGNOSIS — R7401 Elevation of levels of liver transaminase levels: Secondary | ICD-10-CM | POA: Diagnosis not present

## 2023-06-06 DIAGNOSIS — Z79899 Other long term (current) drug therapy: Secondary | ICD-10-CM

## 2023-06-06 LAB — CBC WITH DIFFERENTIAL/PLATELET
Basophils Absolute: 41 cells/uL (ref 0–200)
Eosinophils Relative: 1.3 %
Hemoglobin: 13.4 g/dL (ref 13.2–17.1)
MCH: 30.5 pg (ref 27.0–33.0)
MCHC: 33.8 g/dL (ref 32.0–36.0)
MCV: 90.2 fL (ref 80.0–100.0)
MPV: 11 fL (ref 7.5–12.5)
Monocytes Relative: 5.3 %
RBC: 4.4 10*6/uL (ref 4.20–5.80)
RDW: 14.2 % (ref 11.0–15.0)

## 2023-06-06 LAB — SEDIMENTATION RATE: Sed Rate: 2 mm/h (ref 0–15)

## 2023-06-06 MED ORDER — COSENTYX UNOREADY 300 MG/2ML ~~LOC~~ SOAJ
300.0000 mg | SUBCUTANEOUS | 2 refills | Status: DC
Start: 2023-06-06 — End: 2023-09-18
  Filled 2023-06-06 – 2023-07-05 (×3): qty 2, 28d supply, fill #0
  Filled 2023-07-31: qty 2, 28d supply, fill #1
  Filled 2023-08-24: qty 2, 28d supply, fill #2

## 2023-06-06 NOTE — Addendum Note (Signed)
Addended by: Fuller Plan on: 06/06/2023 08:17 AM   Modules accepted: Orders

## 2023-06-06 NOTE — Progress Notes (Signed)
Office Visit Note  Patient: Ryan Lynch             Date of Birth: 01/29/1981           MRN: [id number]             PCP: Patient, No Pcp Per Referring: No ref. provider found Visit Date: 06/06/2023   Subjective:  Follow-up (Patient states the Cosentyx has helped a lot. )   History of Present Illness: Ryan Lynch is a 42 y.o. male here for follow up for psoriatic arthritis and plaque psoriasis now on Cosentyx 300 mg subcu monthly.  So far he has seen a large improvement in symptoms.  Skin rashes improved no longer with redness and almost all scaling is gone still has residual hyperpigmented patches in the previously inflamed areas.  Joint pain and stiffness is also improved.  Still using a left knee brace just as a precaution but not having too much pain except with very increased use.  He had a wisdom tooth extraction after this cracked and was temporarily on antibiotics.  Previous HPI 03/20/23 Ryan Lynch is a 42 y.o. 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  He has a longstanding history of psoriasis starting in his 15s.  Started out exclusively as skin plaques subsequently developed nail discoloration and pitting and then with joint pain in multiple areas.  He has joint pain with occasional swelling in the fingers and toes on both sides sometimes hip and shoulder pain and not sure if these are swollen.  He was never on any systemic treatment for this.  He suffered a fall down a flight of steps last year with multiple injuries including T7 anterior vertebral body compression fracture, comminuted left tibial plateau fracture and scalp hematoma.  Subsequent had surgery for repair of the left knee fracture.  After all his events he experienced a severe worsening of his psoriasis throughout his body and increasing joint pain.  At first the left knee pain was most severe but later had the inflammatory joint  pain in multiple areas including the right knee has actually become equal to the original injured site.  His increase symptoms are ongoing continuously since last September which has never happened before for this duration or intensity.  He was taking oxycodone initially around type injury currently just using ibuprofen, Tylenol, and gabapentin. He has a previous history of alcohol abuse associated with anxiety disorder.  Had negative screening for hepatitis last year.  No history of frequent recurrent infections.   HCV neg 2023   Review of Systems  Constitutional:  Negative for fatigue.  HENT:  Negative for mouth sores and mouth dryness.   Eyes:  Negative for dryness.  Respiratory:  Negative for shortness of breath.   Cardiovascular:  Negative for chest pain and palpitations.  Gastrointestinal:  Negative for blood in stool, constipation and diarrhea.  Endocrine: Negative for increased urination.  Genitourinary:  Negative for involuntary urination.  Musculoskeletal:  Positive for joint pain, joint pain and morning stiffness. Negative for gait problem, joint swelling, myalgias, muscle weakness, muscle tenderness and myalgias.  Skin:  Negative for color change, rash, hair loss and sensitivity to sunlight.  Allergic/Immunologic: Negative for susceptible to infections.  Neurological:  Negative for dizziness and headaches.  Hematological:  Negative for swollen glands.  Psychiatric/Behavioral:  Negative for depressed mood and sleep disturbance. The patient is not nervous/anxious.     PMFS  History:  Patient Active Problem List   Diagnosis Date Noted   Psoriatic arthritis ([name]) 03/20/2023   High risk medication use 03/20/2023   Vitamin D deficiency 03/20/2023   Generalized anxiety disorder    Closed nondisplaced fracture of left tibial plateau 07/16/2022   Multiple rib fractures 07/11/2022   Compression fracture of body of thoracic vertebra ([name]) 07/11/2022   Sepsis ([name]) 07/09/2022   Acute  urinary retention 07/09/2022   Prolonged QT interval 07/09/2022   AKI (acute kidney injury) ([name]) 07/07/2022   Alcohol withdrawal ([name]) 07/05/2022   Acute anemia 07/05/2022   Psoriasis 07/05/2022   Ileus ([name]) 07/04/2022   Acute metabolic encephalopathy 07/04/2022   Traumatic rhabdomyolysis ([name]) 07/03/2022   Observation after surgery 07/02/2022   Tibial plateau fracture, left 07/02/2022   Alcohol use 07/02/2022   Transaminitis 07/02/2022   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[name]) 07/02/2022   Adrenal nodule ([name]) 07/02/2022   Compression fracture of T7 vertebra ([name]) 07/02/2022   Thrombocytopenia ([name]) 07/02/2022   Anxiety 07/02/2022    Past Medical History:  Diagnosis Date   ADD (attention deficit disorder)    Psoriasis    Psoriatic arthritis ([name])     Family History  Problem Relation Age of Onset   Osteoporosis Mother    Arthritis Father    Past Surgical History:  Procedure Laterality Date   ANTERIOR CRUCIATE LIGAMENT REPAIR Right    ANTERIOR CRUCIATE LIGAMENT REPAIR Right    APPLICATION OF WOUND VAC Left 07/02/2022   Procedure: APPLICATION OF WOUND VAC;  Surgeon: Myrene Galas, MD;  Location: [facility] OR;  Service: Orthopedics;  Laterality: Left;   APPLICATION OF WOUND VAC Left 07/05/2022   Procedure: APPLICATION OF WOUND VAC;  Surgeon: Myrene Galas, MD;  Location: [facility] OR;  Service: Orthopedics;  Laterality: Left;   CLOSED REDUCTION TIBIA Left 07/02/2022   Procedure: CLOSED REDUCTION TIBIA;  Surgeon: Myrene Galas, MD;  Location: [facility] OR;  Service: Orthopedics;  Laterality: Left;   EXTERNAL FIXATION LEG Left 07/02/2022   Procedure: EXTERNAL FIXATION LEG;  Surgeon: Myrene Galas, MD;  Location: [facility] OR;  Service: Orthopedics;  Laterality: Left;   FASCIOTOMY Left 07/02/2022   Procedure: FASCIOTOMY;  Surgeon: Myrene Galas, MD;  Location: [facility] OR;  Service: Orthopedics;  Laterality: Left;   I & D EXTREMITY Left 07/07/2022   Procedure: IRRIGATION AND DEBRIDEMENT EXTREMITY;  Surgeon:  Myrene Galas, MD;  Location: [facility] OR;  Service: Orthopedics;  Laterality: Left;   ORIF TIBIA PLATEAU Left 07/05/2022   Procedure: OPEN REDUCTION INTERNAL FIXATION (ORIF) TIBIAL PLATEAU;  Surgeon: Myrene Galas, MD;  Location: [facility] OR;  Service: Orthopedics;  Laterality: Left;   SECONDARY CLOSURE OF WOUND Left 07/05/2022   Procedure: SECONDARY CLOSURE OF WOUND;  Surgeon: Myrene Galas, MD;  Location: [facility] OR;  Service: Orthopedics;  Laterality: Left;   SECONDARY CLOSURE OF WOUND Left 07/07/2022   Procedure: SECONDARY CLOSURE OF WOUND;  Surgeon: Myrene Galas, MD;  Location: [facility] OR;  Service: Orthopedics;  Laterality: Left;   SHOULDER SURGERY Bilateral    WISDOM TOOTH EXTRACTION  05/23/2023   Social History   Social History Narrative   Not on file    There is no immunization history on file for this patient.   Objective: Vital Signs: BP (!) 144/94 (BP Location: Right Arm, Patient Position: Sitting, Cuff Size: Normal)   Pulse 76   Resp 14   Ht 5\' 10"  (1.778 m)   Wt 162 lb (73.5 kg)   BMI 23.24 kg/m    Physical Exam Eyes:  Conjunctiva/sclera: Conjunctivae normal.  Cardiovascular:     Rate and Rhythm: Normal rate and regular rhythm.  Pulmonary:     Effort: Pulmonary effort is normal.     Breath sounds: Normal breath sounds.  Lymphadenopathy:     Cervical: No cervical adenopathy.  Skin:    General: Skin is warm and dry.     Findings: Rash present.     Comments: Flat hyperpigmented patches in several areas on arms and legs, on abdomen and around umbilicus there is faint residual scaling Nail pitting on both hands  Neurological:     Mental Status: He is alert.  Psychiatric:        Mood and Affect: Mood normal.      Musculoskeletal Exam:  Shoulders full ROM no tenderness or swelling Elbows full ROM no tenderness or swelling Wrists full ROM no tenderness or swelling Fingers full ROM no tenderness or swelling Knees full ROM, right knee in supporting brace no palpable  effusion Ankles full ROM no tenderness or swelling   Investigation: No additional findings.  Imaging: No results found.  Recent Labs: Lab Results  Component Value Date   WBC 5.0 03/20/2023   HGB 13.8 03/20/2023   PLT CANCELED 03/20/2023   NA 142 03/20/2023   K 4.4 03/20/2023   CL 107 03/20/2023   CO2 26 03/20/2023   GLUCOSE 81 03/20/2023   BUN 22 03/20/2023   CREATININE 0.91 03/20/2023   BILITOT 0.5 03/20/2023   ALKPHOS 407 (H) 07/18/2022   AST 31 03/20/2023   ALT 48 (H) 03/20/2023   PROT 7.3 03/20/2023   ALBUMIN 2.7 (L) 07/18/2022   CALCIUM 10.1 03/20/2023   QFTBGOLDPLUS NEGATIVE 03/20/2023    Speciality Comments: No specialty comments available.  Procedures:  No procedures performed Allergies: Timothy grass pollen allergen   Assessment / Plan:     Visit Diagnoses: Psoriatic arthritis ([name]) - Plan: Sedimentation rate  Reported symptoms are much improved there is no appreciable synovitis on exam today. Will recheck sedimentation rate for disease activity monitoring.  Plan to continue Cosentyx 300 mg subcu monthly.  Psoriasis  Rashes partially improved, mild scaling remains on torso and the nail pitting has not improved. Expect additional benefit after longer duration.  Transaminitis High risk medication use - Plan: CBC with Differential/Platelet, COMPLETE METABOLIC PANEL WITH GFR  Mildly elevated LFTs on baseline labs will recheck today now on Cosentyx. Possibly related with previous alcohol use also would have risk for NAFLD in setting of psoriasis. Not really on other hepatotoxic medications. Checking CBC and CMP today monitoring on Cosentyx. No serious interval infections.  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  On gabapentin 600 mg 3 times daily and ibuprofen as needed.     Orders: Orders Placed This Encounter  Procedures   Sedimentation rate   CBC  with Differential/Platelet   COMPLETE METABOLIC PANEL WITH GFR   No orders of the defined types were placed in this encounter.    Follow-Up Instructions: Return in about 3 months (around 09/06/2023) for PsA on COS f/u 3mos.   Fuller Plan, MD  Note - This record has been created using [employer].  Chart creation errors have been sought, but may not always  have been located. Such creation errors do not reflect on  the standard of medical care.

## 2023-06-07 LAB — COMPLETE METABOLIC PANEL WITH GFR
AG Ratio: 1.8 (calc) (ref 1.0–2.5)
ALT: 17 U/L (ref 9–46)
AST: 19 U/L (ref 10–40)
Albumin: 4.6 g/dL (ref 3.6–5.1)
Alkaline phosphatase (APISO): 73 U/L (ref 36–130)
BUN: 15 mg/dL (ref 7–25)
CO2: 29 mmol/L (ref 20–32)
Calcium: 9.8 mg/dL (ref 8.6–10.3)
Chloride: 105 mmol/L (ref 98–110)
Creat: 0.92 mg/dL (ref 0.60–1.29)
Globulin: 2.6 g/dL (calc) (ref 1.9–3.7)
Glucose, Bld: 93 mg/dL (ref 65–99)
Potassium: 4.6 mmol/L (ref 3.5–5.3)
Sodium: 141 mmol/L (ref 135–146)
Total Bilirubin: 0.4 mg/dL (ref 0.2–1.2)
Total Protein: 7.2 g/dL (ref 6.1–8.1)
eGFR: 106 mL/min/{1.73_m2} (ref >=60)

## 2023-06-07 LAB — CBC WITH DIFFERENTIAL/PLATELET
Absolute Monocytes: 239 cells/uL (ref 200–950)
Basophils Relative: 0.9 %
Eosinophils Absolute: 59 cells/uL (ref 15–500)
HCT: 39.7 % (ref 38.5–50.0)
Lymphs Abs: 1638 cells/uL (ref 850–3900)
Neutro Abs: 2525 cells/uL (ref 1500–7800)
Neutrophils Relative %: 56.1 %
Platelets: 118 10*3/uL — ABNORMAL LOW (ref 140–400)
Total Lymphocyte: 36.4 %
WBC: 4.5 10*3/uL (ref 3.8–10.8)

## 2023-06-07 NOTE — Progress Notes (Signed)
Blood count shows a slightly low number of platelets but this may be a false result due to clumping. And this is not a side effect usally associated with the medication. Otherwise results are all normal. No problem for continuing Cosentyx as planned.

## 2023-06-28 ENCOUNTER — Other Ambulatory Visit (HOSPITAL_COMMUNITY): Payer: Self-pay

## 2023-06-30 ENCOUNTER — Other Ambulatory Visit (HOSPITAL_COMMUNITY): Payer: Self-pay

## 2023-07-03 ENCOUNTER — Other Ambulatory Visit (HOSPITAL_COMMUNITY): Payer: Self-pay

## 2023-07-03 ENCOUNTER — Other Ambulatory Visit: Payer: Self-pay

## 2023-07-05 ENCOUNTER — Other Ambulatory Visit (HOSPITAL_COMMUNITY): Payer: Self-pay

## 2023-07-05 ENCOUNTER — Other Ambulatory Visit: Payer: Self-pay

## 2023-07-27 ENCOUNTER — Other Ambulatory Visit (HOSPITAL_COMMUNITY): Payer: Self-pay

## 2023-07-31 ENCOUNTER — Other Ambulatory Visit (HOSPITAL_COMMUNITY): Payer: Self-pay

## 2023-08-24 ENCOUNTER — Other Ambulatory Visit (HOSPITAL_COMMUNITY): Payer: Self-pay

## 2023-08-24 ENCOUNTER — Other Ambulatory Visit (HOSPITAL_COMMUNITY): Payer: Self-pay | Admitting: Pharmacy Technician

## 2023-08-24 NOTE — Progress Notes (Signed)
Specialty Pharmacy Refill Coordination Note  Ryan Lynch is a 42 y.o. male contacted today regarding refills of specialty medication(s) Secukinumab   Patient requested Delivery   Delivery date: 08/31/23   Verified address: [address] [place]   Medication will be filled on 08/30/23.

## 2023-08-28 NOTE — Progress Notes (Deleted)
Office Visit Note  Patient: Ryan Lynch             Date of Birth: 02-05-1981           MRN: [id number]             PCP: Patient, No Pcp Per Referring: No ref. provider found Visit Date: 09/11/2023   Subjective:  No chief complaint on file.   History of Present Illness: Ryan Lynch is a 42 y.o. male here for follow up for psoriatic arthritis and plaque psoriasis now on Cosentyx 300 mg subcu monthly.    Previous HPI 06/06/2023 Ryan Lynch is a 42 y.o. male here for follow up for psoriatic arthritis and plaque psoriasis now on Cosentyx 300 mg subcu monthly.  So far he has seen a large improvement in symptoms.  Skin rashes improved no longer with redness and almost all scaling is gone still has residual hyperpigmented patches in the previously inflamed areas.  Joint pain and stiffness is also improved.  Still using a left knee brace just as a precaution but not having too much pain except with very increased use.  He had a wisdom tooth extraction after this cracked and was temporarily on antibiotics.   Previous HPI 03/20/23 Ryan Lynch is a 42 y.o. 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  He has a longstanding history of psoriasis starting in his 21s.  Started out exclusively as skin plaques subsequently developed nail discoloration and pitting and then with joint pain in multiple areas.  He has joint pain with occasional swelling in the fingers and toes on both sides sometimes hip and shoulder pain and not sure if these are swollen.  He was never on any systemic treatment for this.  He suffered a fall down a flight of steps last year with multiple injuries including T7 anterior vertebral body compression fracture, comminuted left tibial plateau fracture and scalp hematoma.  Subsequent had surgery for repair of the left knee fracture.  After all his events he experienced a severe worsening of his  psoriasis throughout his body and increasing joint pain.  At first the left knee pain was most severe but later had the inflammatory joint pain in multiple areas including the right knee has actually become equal to the original injured site.  His increase symptoms are ongoing continuously since last September which has never happened before for this duration or intensity.  He was taking oxycodone initially around type injury currently just using ibuprofen, Tylenol, and gabapentin. He has a previous history of alcohol abuse associated with anxiety disorder.  Had negative screening for hepatitis last year.  No history of frequent recurrent infections.   HCV neg 2023     No Rheumatology ROS completed.   PMFS History:  Patient Active Problem List   Diagnosis Date Noted   Psoriatic arthritis ([name]) 03/20/2023   High risk medication use 03/20/2023   Vitamin D deficiency 03/20/2023   Generalized anxiety disorder    Closed nondisplaced fracture of left tibial plateau 07/16/2022   Multiple rib fractures 07/11/2022   Compression fracture of body of thoracic vertebra ([name]) 07/11/2022   Sepsis ([name]) 07/09/2022   Acute urinary retention 07/09/2022   Prolonged QT interval 07/09/2022   AKI (acute kidney injury) ([name]) 07/07/2022   Alcohol withdrawal ([name]) 07/05/2022   Acute anemia 07/05/2022   Psoriasis 07/05/2022   Ileus ([name]) 07/04/2022   Acute metabolic encephalopathy 07/04/2022  Traumatic rhabdomyolysis ([name]) 07/03/2022   Observation after surgery 07/02/2022   Tibial plateau fracture, left 07/02/2022   Alcohol use 07/02/2022   Transaminitis 07/02/2022   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[name]) 07/02/2022   Adrenal nodule ([name]) 07/02/2022   Compression fracture of T7 vertebra ([name]) 07/02/2022   Thrombocytopenia ([name]) 07/02/2022   Anxiety 07/02/2022    Past Medical History:  Diagnosis Date   ADD (attention deficit disorder)    Psoriasis    Psoriatic arthritis ([name])     Family History   Problem Relation Age of Onset   Osteoporosis Mother    Arthritis Father    Past Surgical History:  Procedure Laterality Date   ANTERIOR CRUCIATE LIGAMENT REPAIR Right    ANTERIOR CRUCIATE LIGAMENT REPAIR Right    APPLICATION OF WOUND VAC Left 07/02/2022   Procedure: APPLICATION OF WOUND VAC;  Surgeon: Myrene Galas, MD;  Location: [facility] OR;  Service: Orthopedics;  Laterality: Left;   APPLICATION OF WOUND VAC Left 07/05/2022   Procedure: APPLICATION OF WOUND VAC;  Surgeon: Myrene Galas, MD;  Location: [facility] OR;  Service: Orthopedics;  Laterality: Left;   CLOSED REDUCTION TIBIA Left 07/02/2022   Procedure: CLOSED REDUCTION TIBIA;  Surgeon: Myrene Galas, MD;  Location: [facility] OR;  Service: Orthopedics;  Laterality: Left;   EXTERNAL FIXATION LEG Left 07/02/2022   Procedure: EXTERNAL FIXATION LEG;  Surgeon: Myrene Galas, MD;  Location: [facility] OR;  Service: Orthopedics;  Laterality: Left;   FASCIOTOMY Left 07/02/2022   Procedure: FASCIOTOMY;  Surgeon: Myrene Galas, MD;  Location: [facility] OR;  Service: Orthopedics;  Laterality: Left;   I & D EXTREMITY Left 07/07/2022   Procedure: IRRIGATION AND DEBRIDEMENT EXTREMITY;  Surgeon: Myrene Galas, MD;  Location: [facility] OR;  Service: Orthopedics;  Laterality: Left;   ORIF TIBIA PLATEAU Left 07/05/2022   Procedure: OPEN REDUCTION INTERNAL FIXATION (ORIF) TIBIAL PLATEAU;  Surgeon: Myrene Galas, MD;  Location: [facility] OR;  Service: Orthopedics;  Laterality: Left;   SECONDARY CLOSURE OF WOUND Left 07/05/2022   Procedure: SECONDARY CLOSURE OF WOUND;  Surgeon: Myrene Galas, MD;  Location: [facility] OR;  Service: Orthopedics;  Laterality: Left;   SECONDARY CLOSURE OF WOUND Left 07/07/2022   Procedure: SECONDARY CLOSURE OF WOUND;  Surgeon: Myrene Galas, MD;  Location: [facility] OR;  Service: Orthopedics;  Laterality: Left;   SHOULDER SURGERY Bilateral    WISDOM TOOTH EXTRACTION  05/23/2023   Social History   Social History Narrative   Not on file    There is no immunization  history on file for this patient.   Objective: Vital Signs: There were no vitals taken for this visit.   Physical Exam   Musculoskeletal Exam: ***  CDAI Exam: CDAI Score: -- Patient Global: --; Provider Global: -- Swollen: --; Tender: -- Joint Exam 09/11/2023   No joint exam has been documented for this visit   There is currently no information documented on the homunculus. Go to the Rheumatology activity and complete the homunculus joint exam.  Investigation: No additional findings.  Imaging: No results found.  Recent Labs: Lab Results  Component Value Date   WBC 4.5 06/06/2023   HGB 13.4 06/06/2023   PLT 118 (L) 06/06/2023   NA 141 06/06/2023   K 4.6 06/06/2023   CL 105 06/06/2023   CO2 29 06/06/2023   GLUCOSE 93 06/06/2023   BUN 15 06/06/2023   CREATININE 0.92 06/06/2023   BILITOT 0.4 06/06/2023   ALKPHOS 407 (H) 07/18/2022   AST 19 06/06/2023   ALT 17 06/06/2023  PROT 7.2 06/06/2023   ALBUMIN 2.7 (L) 07/18/2022   CALCIUM 9.8 06/06/2023   QFTBGOLDPLUS NEGATIVE 03/20/2023    Speciality Comments: No specialty comments available.  Procedures:  No procedures performed Allergies: Timothy grass pollen allergen   Assessment / Plan:     Visit Diagnoses: No diagnosis found.  ***  Orders: No orders of the defined types were placed in this encounter.  No orders of the defined types were placed in this encounter.    Follow-Up Instructions: No follow-ups on file.   Metta Clines, RT  Note - This record has been created using [employer].  Chart creation errors have been sought, but may not always  have been located. Such creation errors do not reflect on  the standard of medical care.

## 2023-09-04 ENCOUNTER — Telehealth: Payer: Self-pay | Admitting: *Deleted

## 2023-09-04 NOTE — Telephone Encounter (Signed)
Patient contacted the office stating he has an appointment on 09/11/2023. Patient states he is having swelling in his right knee. Patient concerned as he has a history of ACL surgery. Patient states he is also due for his Cosentyx on September 07, 2023. Patient was asking if he could give his injection early. Patient advised he is not able to take his injection early and would need to take injections 4 weeks apart. Patient was offered an appointment for this afternoon to evaluate his knee. Patient declined.

## 2023-09-08 ENCOUNTER — Other Ambulatory Visit: Payer: Self-pay

## 2023-09-08 ENCOUNTER — Emergency Department (HOSPITAL_BASED_OUTPATIENT_CLINIC_OR_DEPARTMENT_OTHER): Payer: BC Managed Care – PPO | Admitting: Radiology

## 2023-09-08 ENCOUNTER — Encounter (HOSPITAL_BASED_OUTPATIENT_CLINIC_OR_DEPARTMENT_OTHER): Payer: Self-pay

## 2023-09-08 ENCOUNTER — Emergency Department (HOSPITAL_BASED_OUTPATIENT_CLINIC_OR_DEPARTMENT_OTHER)
Admission: EM | Admit: 2023-09-08 | Discharge: 2023-09-08 | Disposition: A | Discharge status: Home / Self Care | Payer: BC Managed Care – PPO | Attending: Emergency Medicine | Admitting: Emergency Medicine

## 2023-09-08 DIAGNOSIS — S42391A Other fracture of shaft of right humerus, initial encounter for closed fracture: Secondary | ICD-10-CM | POA: Diagnosis not present

## 2023-09-08 DIAGNOSIS — S42211A Unspecified displaced fracture of surgical neck of right humerus, initial encounter for closed fracture: Secondary | ICD-10-CM | POA: Diagnosis not present

## 2023-09-08 DIAGNOSIS — Y92009 Unspecified place in unspecified non-institutional (private) residence as the place of occurrence of the external cause: Secondary | ICD-10-CM | POA: Insufficient documentation

## 2023-09-08 DIAGNOSIS — Y9301 Activity, walking, marching and hiking: Secondary | ICD-10-CM | POA: Insufficient documentation

## 2023-09-08 DIAGNOSIS — W010XXA Fall on same level from slipping, tripping and stumbling without subsequent striking against object, initial encounter: Secondary | ICD-10-CM | POA: Diagnosis not present

## 2023-09-08 DIAGNOSIS — R000 Tachycardia, unspecified: Secondary | ICD-10-CM | POA: Diagnosis not present

## 2023-09-08 DIAGNOSIS — R9431 Abnormal electrocardiogram [ECG] [EKG]: Secondary | ICD-10-CM | POA: Diagnosis not present

## 2023-09-08 DIAGNOSIS — S42291A Other displaced fracture of upper end of right humerus, initial encounter for closed fracture: Secondary | ICD-10-CM | POA: Diagnosis not present

## 2023-09-08 DIAGNOSIS — M19011 Primary osteoarthritis, right shoulder: Secondary | ICD-10-CM | POA: Diagnosis not present

## 2023-09-08 DIAGNOSIS — M25511 Pain in right shoulder: Secondary | ICD-10-CM | POA: Diagnosis not present

## 2023-09-08 DIAGNOSIS — W19XXXA Unspecified fall, initial encounter: Secondary | ICD-10-CM

## 2023-09-08 DIAGNOSIS — S42201A Unspecified fracture of upper end of right humerus, initial encounter for closed fracture: Secondary | ICD-10-CM | POA: Diagnosis not present

## 2023-09-08 LAB — CBC
HCT: 42.7 % (ref 39.0–52.0)
Hemoglobin: 15 g/dL (ref 13.0–17.0)
MCH: 30.4 pg (ref 26.0–34.0)
MCHC: 35.1 g/dL (ref 30.0–36.0)
MCV: 86.4 fL (ref 80.0–100.0)
Platelets: 193 10*3/uL (ref 150–400)
RBC: 4.94 MIL/uL (ref 4.22–5.81)
RDW: 15.9 % — ABNORMAL HIGH (ref 11.5–15.5)
WBC: 9.7 10*3/uL (ref 4.0–10.5)
nRBC: 0 % (ref 0.0–0.2)

## 2023-09-08 LAB — BASIC METABOLIC PANEL
Anion gap: 15 (ref 5–15)
BUN: 11 mg/dL (ref 6–20)
CO2: 22 mmol/L (ref 22–32)
Calcium: 9 mg/dL (ref 8.9–10.3)
Chloride: 103 mmol/L (ref 98–111)
Creatinine, Ser: 0.91 mg/dL (ref 0.61–1.24)
GFR, Estimated: >60 mL/min (ref >60)
Glucose, Bld: 124 mg/dL — ABNORMAL HIGH (ref 70–99)
Potassium: 3.9 mmol/L (ref 3.5–5.1)
Sodium: 140 mmol/L (ref 135–145)

## 2023-09-08 MED ORDER — OXYCODONE-ACETAMINOPHEN 5-325 MG PO TABS
1.0000 | ORAL_TABLET | Freq: Once | ORAL | Status: AC
  Administered 2023-09-08: 1 via ORAL
  Filled 2023-09-08: qty 1

## 2023-09-08 MED ORDER — OXYCODONE HCL 5 MG PO TABS: 5.0000 mg | ORAL_TABLET | Freq: Four times a day (QID) | ORAL | 0 refills | Status: DC | PRN

## 2023-09-08 MED ORDER — KETOROLAC TROMETHAMINE 15 MG/ML IJ SOLN
15.0000 mg | Freq: Once | INTRAMUSCULAR | Status: AC
Start: 2023-09-08 — End: 2023-09-08
  Administered 2023-09-08: 15 mg via INTRAVENOUS
  Filled 2023-09-08: qty 1

## 2023-09-08 MED ORDER — FENTANYL CITRATE PF 50 MCG/ML IJ SOSY
50.0000 ug | PREFILLED_SYRINGE | Freq: Once | INTRAMUSCULAR | Status: AC
  Administered 2023-09-08: 50 ug via INTRAVENOUS
  Filled 2023-09-08: qty 1

## 2023-09-08 NOTE — ED Provider Notes (Signed)
[place] [facility] AT [facility] Provider Note   CSN: [id number] Arrival date & time: 09/08/23  1755     History  Chief Complaint  Patient presents with   Fall   Loss of Consciousness    Ryan Lynch is a 42 y.o. male.  Patient is a 42 year old male with a past medical history of alcohol use and psoriasis presenting to the emergency department with shoulder pain.  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  He states while walking home he slipped on a patch of water and fell onto his right shoulder and chin.  He states that he briefly passed out after hitting the ground.  He states that he did not feel lightheaded or dizzy, have any chest pain or shortness of breath prior to the fall.  He states he is only having pain in his right shoulder at this time.  Denies any headaches, nausea or vomiting, numbness or weakness.  The history is provided by the patient.  Fall  Loss of Consciousness      Home Medications Prior to Admission medications   Medication Sig Start Date End Date Taking? Authorizing Provider  oxyCODONE (ROXICODONE) 5 MG immediate release tablet Take 1 tablet (5 mg total) by mouth every 6 (six) hours as needed. 09/08/23  Yes Theresia Lo, [place] K, DO  gabapentin (NEURONTIN) 600 MG tablet Take 600 mg by mouth 3 (three) times daily. 03/16/23   [provider]  ibuprofen (ADVIL) 200 MG tablet Take 200 mg by mouth every 6 (six) hours as needed.    [provider]  Secukinumab (COSENTYX UNOREADY) 300 MG/2ML SOAJ Inject 300 mg into the skin every 28 (twenty-eight) days. On 04/09/23 then every 4 weeks thereafter 06/06/23   Fuller Plan, MD  sertraline (ZOLOFT) 100 MG tablet Take 1 tablet (100 mg total) by mouth daily. 07/22/22   Angiulli, Mcarthur Rossetti, PA-C  vitamin D3 (CHOLECALCIFEROL) 25 MCG tablet Take 2 tablets (2,000 Units total) by  mouth 2 (two) times daily. Patient not taking: Reported on 03/20/2023 07/22/22   Angiulli, Mcarthur Rossetti, PA-C      Allergies    Timothy grass pollen allergen    Review of Systems   Review of Systems  Cardiovascular:  Positive for syncope.    Physical Exam Updated Vital Signs BP 114/85   Pulse (!) 110   Temp 98.3 F (36.8 C) (Oral)   Resp 18   Ht 5\' 11"  (1.803 m)   Wt 77.1 kg   SpO2 95%   BMI 23.71 kg/m  Physical Exam Vitals and nursing note reviewed.  Constitutional:      General: He is not in acute distress.    Appearance: Normal appearance.  HENT:     Head: Normocephalic and atraumatic.     Comments: Nonbleeding abrasion to chin    Nose: Nose normal.     Mouth/Throat:     Mouth: Mucous membranes are moist.     Pharynx: Oropharynx is clear.  Eyes:     Extraocular Movements: Extraocular movements intact.     Conjunctiva/sclera: Conjunctivae normal.  Neck:     Comments: No midline neck tenderness Cardiovascular:     Rate and Rhythm: Regular rhythm. Tachycardia present.     Pulses: Normal pulses.     Heart sounds: Normal heart sounds.  Pulmonary:     Effort: Pulmonary effort is normal.  Breath sounds: Normal breath sounds.  Abdominal:     General: Abdomen is flat.     Palpations: Abdomen is soft.     Tenderness: There is no abdominal tenderness.  Musculoskeletal:        General: Normal range of motion.     Cervical back: Normal range of motion and neck supple.     Comments: No midline back tenderness Tenderness to palpation of right shoulder with swelling and obvious deformity, no right elbow or wrist tenderness to palpation No tenderness to left upper extremity or bilateral lower extremities Pelvis stable, nontender  Skin:    General: Skin is warm and dry.  Neurological:     General: No focal deficit present.     Mental Status: He is alert and oriented to person, place, and time.  Psychiatric:        Mood and Affect: Mood normal.        Behavior: Behavior  normal.     ED Results / Procedures / Treatments   Labs (all labs ordered are listed, but only abnormal results are displayed) Labs Reviewed  BASIC METABOLIC PANEL - Abnormal; Notable for the following components:      Result Value   Glucose, Bld 124 (*)    All other components within normal limits  CBC - Abnormal; Notable for the following components:   RDW 15.9 (*)    All other components within normal limits  URINALYSIS, ROUTINE W REFLEX MICROSCOPIC  CBG MONITORING, ED    EKG EKG Interpretation Date/Time:  Friday September 08 2023 18:27:56 EDT Ventricular Rate:  124 PR Interval:  126 QRS Duration:  86 QT Interval:  320 QTC Calculation: 459 R Axis:   97  Text Interpretation: Sinus tachycardia Rightward axis Borderline ECG No significant change since last tracing Confirmed by Elayne Snare ([id number]) on 09/08/2023 6:44:50 PM  Radiology DG Shoulder Right  Result Date: 09/08/2023 CLINICAL DATA:  Trauma, fall with right shoulder pain. EXAM: RIGHT SHOULDER - 2+ VIEW COMPARISON:  10/29/2015. FINDINGS: There is a mildly displaced comminuted fracture of the humeral head/neck. The remaining bony structures are intact and there is no dislocation. Mild degenerative changes are present at the glenohumeral and acromioclavicular joints. The soft tissues are within normal limits. IMPRESSION: Mildly displaced comminuted fracture of the humeral head/neck. Electronically Signed   By: Thornell Sartorius M.D.   On: 09/08/2023 20:14    Procedures Procedures    Medications Ordered in ED Medications  fentaNYL (SUBLIMAZE) injection 50 mcg (50 mcg Intravenous Given 09/08/23 1930)  oxyCODONE-acetaminophen (PERCOCET/ROXICET) 5-325 MG per tablet 1 tablet (1 tablet Oral Given 09/08/23 2012)  ketorolac (TORADOL) 15 MG/ML injection 15 mg (15 mg Intravenous Given 09/08/23 2012)    ED Course/ Medical Decision Making/ A&P                                 Medical Decision Making This patient presents to  the ED with chief complaint(s) of fall, shoulder pain with pertinent past medical history of alcohol use disorder, psoriasis which further complicates the presenting complaint. The complaint involves an extensive differential diagnosis and also carries with it a high risk of complications and morbidity.    The differential diagnosis includes shoulder fracture or dislocation, no presyncopal symptoms again a syncopal fall unlikely, low risk by [place] head CT rule making ICH or mass effect unlikely, no other traumatic injuries seen on exam  Additional history obtained: Additional  history obtained from N/A Records reviewed outpatient rheumatology records  ED Course and Reassessment: On patient's arrival he is tachycardic and otherwise hemodynamically stable in no acute distress.  He was initially evaluated by triage and had EKG, labs and shoulder x-ray performed.  EKG showed sinus tachycardia without acute ischemic changes, labs are within normal range.  Shoulder x-ray did show comminuted fracture of the right humeral head.  He is neurovascularly intact.  No other traumatic injury seen on exam.  No signs of alcohol withdrawal on exam.  Suspect tachycardia is likely related to pain.  He was initially given fentanyl in triage and will be given Percocet and Toradol.  He will be placed in a shoulder sling and will be given outpatient orthopedic follow-up.  Patient was given strict return precautions.  Independent labs interpretation:  The following labs were independently interpreted: Within normal range  Independent visualization of imaging: - I independently visualized the following imaging with scope of interpretation limited to determining acute life threatening conditions related to emergency care: Right shoulder x-ray, which revealed humeral head fracture  Consultation: - Consulted or discussed management/test interpretation w/ external professional: N/A  Consideration for admission or further  workup: Patient has no emergent conditions requiring admission or further work-up at this time and is stable for discharge home with Ortho follow-up  Social Determinants of health: N/A    Amount and/or Complexity of Data Reviewed Labs: ordered. Radiology: ordered.  Risk Prescription drug management.          Final Clinical Impression(s) / ED Diagnoses Final diagnoses:  Closed fracture of head of right humerus, initial encounter  Fall, initial encounter    Rx / DC Orders ED Discharge Orders          Ordered    oxyCODONE (ROXICODONE) 5 MG immediate release tablet  Every 6 hours PRN        09/08/23 2026              Rexford Maus, DO 09/08/23 2027

## 2023-09-08 NOTE — ED Triage Notes (Signed)
Pt states that his R leg buckled and he slipped and fell in a mud puddle. Pt reports R leg and R arm/shoulder pain. Pt reports that he hit his head and "was out for a little bit".

## 2023-09-08 NOTE — Discharge Instructions (Signed)
You were seen in the emergency department after your fall.  You did break your humerus bone in your left shoulder.  We have placed you in a shoulder sling.  You can take this off to shower but otherwise wear this at all times and should not lift at all with this right arm until you are cleared by orthopedics.  You can ice your shoulder and keep it elevated to help with the swelling.  You can take Tylenol and Motrin every 6 hours as needed for pain and I have given you oxycodone to take for breakthrough pain.  This can make you drowsy so do not take it before driving, working or operating heavy machinery.  You can follow-up with orthopedics within the next week to have your shoulder rechecked.  You should return to the emergency department for significant increased pain if you have numbness in your hand or your fingers turn blue or any other new or concerning symptoms.

## 2023-09-11 ENCOUNTER — Ambulatory Visit: Payer: BC Managed Care – PPO | Admitting: Internal Medicine

## 2023-09-11 DIAGNOSIS — Z79899 Other long term (current) drug therapy: Secondary | ICD-10-CM

## 2023-09-11 DIAGNOSIS — R7401 Elevation of levels of liver transaminase levels: Secondary | ICD-10-CM

## 2023-09-11 DIAGNOSIS — S82145S Nondisplaced bicondylar fracture of left tibia, sequela: Secondary | ICD-10-CM

## 2023-09-11 DIAGNOSIS — L4050 Arthropathic psoriasis, unspecified: Secondary | ICD-10-CM

## 2023-09-11 DIAGNOSIS — L409 Psoriasis, unspecified: Secondary | ICD-10-CM

## 2023-09-15 DIAGNOSIS — S42309A Unspecified fracture of shaft of humerus, unspecified arm, initial encounter for closed fracture: Secondary | ICD-10-CM

## 2023-09-15 HISTORY — DX: Unspecified fracture of shaft of humerus, unspecified arm, initial encounter for closed fracture: S42.309A

## 2023-09-18 ENCOUNTER — Other Ambulatory Visit: Payer: Self-pay

## 2023-09-18 ENCOUNTER — Ambulatory Visit: Payer: BC Managed Care – PPO | Attending: Internal Medicine | Admitting: Internal Medicine

## 2023-09-18 ENCOUNTER — Encounter: Payer: Self-pay | Admitting: Internal Medicine

## 2023-09-18 VITALS — BP 151/95 | HR 88 | Resp 14 | Ht 70.0 in | Wt 178.0 lb

## 2023-09-18 DIAGNOSIS — Z79899 Other long term (current) drug therapy: Secondary | ICD-10-CM

## 2023-09-18 DIAGNOSIS — L409 Psoriasis, unspecified: Secondary | ICD-10-CM | POA: Diagnosis not present

## 2023-09-18 DIAGNOSIS — L4050 Arthropathic psoriasis, unspecified: Secondary | ICD-10-CM | POA: Diagnosis not present

## 2023-09-18 MED ORDER — COSENTYX UNOREADY 300 MG/2ML ~~LOC~~ SOAJ
300.0000 mg | SUBCUTANEOUS | 2 refills | Status: DC
  Filled 2023-09-18 – 2023-09-19 (×2): qty 2, 28d supply, fill #0

## 2023-09-18 NOTE — Progress Notes (Signed)
Office Visit Note  Patient: Ryan Lynch             Date of Birth: August 13, 1981           MRN: [id number]             PCP: Patient, No Pcp Per Referring: No ref. provider found Visit Date: 09/18/2023   Subjective:  Follow-up (Patient states he broke his right upper humerus about a week ago. )   History of Present Illness: Jarrion Burgeson is a 42 y.o. male here for follow up for psoriatic arthritis and plaque psoriasis now on Cosentyx 300 mg subcu monthly.  He feels the Cosentyx is working well with complete clearance of his skin rashes joint pain or stiffness doing mostly better.  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  Currently in a sling anticipating he has an appointment with orthopedic surgery scheduled in 2 days.  Previous HPI 06/06/2023 Shivank Bence is a 42 y.o. male here for follow up for psoriatic arthritis and plaque psoriasis now on Cosentyx 300 mg subcu monthly.  So far he has seen a large improvement in symptoms.  Skin rashes improved no longer with redness and almost all scaling is gone still has residual hyperpigmented patches in the previously inflamed areas.  Joint pain and stiffness is also improved.  Still using a left knee brace just as a precaution but not having too much pain except with very increased use.  He had a wisdom tooth extraction after this cracked and was temporarily on antibiotics.   Previous HPI 03/20/23 Naaman Manlove is a 42 y.o. 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  He has a longstanding history of psoriasis starting in his 60s.  Started out exclusively as skin plaques subsequently developed nail discoloration and pitting and then with joint pain in multiple areas.  He has joint pain with occasional swelling in the fingers and toes on both sides sometimes hip and shoulder pain and  not sure if these are swollen.  He was never on any systemic treatment for this.  He suffered a fall down a flight of steps last year with multiple injuries including T7 anterior vertebral body compression fracture, comminuted left tibial plateau fracture and scalp hematoma.  Subsequent had surgery for repair of the left knee fracture.  After all his events he experienced a severe worsening of his psoriasis throughout his body and increasing joint pain.  At first the left knee pain was most severe but later had the inflammatory joint pain in multiple areas including the right knee has actually become equal to the original injured site.  His increase symptoms are ongoing continuously since last September which has never happened before for this duration or intensity.  He was taking oxycodone initially around type injury currently just using ibuprofen, Tylenol, and gabapentin. He has a previous history of alcohol abuse associated with anxiety disorder.  Had negative screening for hepatitis last year.  No history of frequent recurrent infections.   HCV neg 2023     Review of Systems  Constitutional:  Negative for fatigue.  HENT:  Negative for mouth sores and mouth dryness.   Eyes:  Negative for dryness.  Respiratory:  Negative for shortness of breath.   Cardiovascular:  Negative for chest pain and palpitations.  Gastrointestinal:  Negative for blood in stool,  constipation and diarrhea.  Endocrine: Negative for increased urination.  Genitourinary:  Negative for involuntary urination.  Musculoskeletal:  Positive for joint pain, joint pain and morning stiffness. Negative for gait problem, joint swelling, myalgias, muscle weakness, muscle tenderness and myalgias.  Skin:  Negative for color change, rash, hair loss and sensitivity to sunlight.  Allergic/Immunologic: Negative for susceptible to infections.  Neurological:  Negative for dizziness and headaches.  Hematological:  Negative for swollen glands.   Psychiatric/Behavioral:  Positive for sleep disturbance. Negative for depressed mood. The patient is not nervous/anxious.     PMFS History:  Patient Active Problem List   Diagnosis Date Noted   Psoriatic arthritis ([name]) 03/20/2023   High risk medication use 03/20/2023   Vitamin D deficiency 03/20/2023   Generalized anxiety disorder    Closed nondisplaced fracture of left tibial plateau 07/16/2022   Multiple rib fractures 07/11/2022   Compression fracture of body of thoracic vertebra ([name]) 07/11/2022   Sepsis ([name]) 07/09/2022   Acute urinary retention 07/09/2022   Prolonged QT interval 07/09/2022   AKI (acute kidney injury) ([name]) 07/07/2022   Alcohol withdrawal ([name]) 07/05/2022   Acute anemia 07/05/2022   Psoriasis 07/05/2022   Ileus ([name]) 07/04/2022   Acute metabolic encephalopathy 07/04/2022   Traumatic rhabdomyolysis ([name]) 07/03/2022   Observation after surgery 07/02/2022   Tibial plateau fracture, left 07/02/2022   Alcohol use 07/02/2022   Transaminitis 07/02/2022   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[name]) 07/02/2022   Adrenal nodule ([name]) 07/02/2022   Compression fracture of T7 vertebra ([name]) 07/02/2022   Thrombocytopenia ([name]) 07/02/2022   Anxiety 07/02/2022    Past Medical History:  Diagnosis Date   ADD (attention deficit disorder)    Broken humerus    Psoriasis    Psoriatic arthritis ([name])     Family History  Problem Relation Age of Onset   Osteoporosis Mother    Arthritis Father    Past Surgical History:  Procedure Laterality Date   ANTERIOR CRUCIATE LIGAMENT REPAIR Right    ANTERIOR CRUCIATE LIGAMENT REPAIR Right    APPLICATION OF WOUND VAC Left 07/02/2022   Procedure: APPLICATION OF WOUND VAC;  Surgeon: Myrene Galas, MD;  Location: [facility] OR;  Service: Orthopedics;  Laterality: Left;   APPLICATION OF WOUND VAC Left 07/05/2022   Procedure: APPLICATION OF WOUND VAC;  Surgeon: Myrene Galas, MD;  Location: [facility] OR;  Service: Orthopedics;  Laterality: Left;    CLOSED REDUCTION TIBIA Left 07/02/2022   Procedure: CLOSED REDUCTION TIBIA;  Surgeon: Myrene Galas, MD;  Location: [facility] OR;  Service: Orthopedics;  Laterality: Left;   EXTERNAL FIXATION LEG Left 07/02/2022   Procedure: EXTERNAL FIXATION LEG;  Surgeon: Myrene Galas, MD;  Location: [facility] OR;  Service: Orthopedics;  Laterality: Left;   FASCIOTOMY Left 07/02/2022   Procedure: FASCIOTOMY;  Surgeon: Myrene Galas, MD;  Location: [facility] OR;  Service: Orthopedics;  Laterality: Left;   I & D EXTREMITY Left 07/07/2022   Procedure: IRRIGATION AND DEBRIDEMENT EXTREMITY;  Surgeon: Myrene Galas, MD;  Location: [facility] OR;  Service: Orthopedics;  Laterality: Left;   ORIF TIBIA PLATEAU Left 07/05/2022   Procedure: OPEN REDUCTION INTERNAL FIXATION (ORIF) TIBIAL PLATEAU;  Surgeon: Myrene Galas, MD;  Location: [facility] OR;  Service: Orthopedics;  Laterality: Left;   SECONDARY CLOSURE OF WOUND Left 07/05/2022   Procedure: SECONDARY CLOSURE OF WOUND;  Surgeon: Myrene Galas, MD;  Location: [facility] OR;  Service: Orthopedics;  Laterality: Left;   SECONDARY CLOSURE OF WOUND Left 07/07/2022   Procedure: SECONDARY CLOSURE OF WOUND;  Surgeon: Carola Frost,  Casimiro Needle, MD;  Location[facility] OR;  Service: Orthopedics;  Laterality: Left;   SHOULDER SURGERY Bilateral    WISDOM TOOTH EXTRACTION  05/23/2023   Social History   Social History Narrative   Not on file    There is no immunization history on file for this patient.   Objective: Vital Signs: BP (!) 151/95 (BP Location: Left Arm, Patient Position: Sitting, Cuff Size: Normal)   Pulse 88   Resp 14   Ht 5\' 10"  (1.778 m)   Wt 178 lb (80.7 kg)   BMI 25.54 kg/m    Physical Exam Eyes:     Conjunctiva/sclera: Conjunctivae normal.  Cardiovascular:     Rate and Rhythm: Normal rate and regular rhythm.  Pulmonary:     Effort: Pulmonary effort is normal.     Breath sounds: Normal breath sounds.  Lymphadenopathy:     Cervical: No cervical adenopathy.  Skin:    General: Skin is warm and  dry.     Findings: No rash.  Neurological:     Mental Status: He is alert.  Psychiatric:        Mood and Affect: Mood normal.      Musculoskeletal Exam:  Right shoulder in sling Wrists full ROM no tenderness or swelling Fingers full ROM no tenderness or swelling Knees full ROM no tenderness or swelling  Investigation: No additional findings.  Imaging: CT SHOULDER RIGHT WO CONTRAST  Result Date: 09/21/2023 CLINICAL DATA:  Larey Seat.  Right shoulder fracture. EXAM: CT OF THE UPPER RIGHT EXTREMITY WITHOUT CONTRAST TECHNIQUE: Multidetector CT imaging of the upper right extremity was performed according to the standard protocol. RADIATION DOSE REDUCTION: This exam was performed according to the departmental dose-optimization program which includes automated exposure control, adjustment of the mA and/or kV according to patient size and/or use of iterative reconstruction technique. COMPARISON:  Radiographs 09/20/2023 FINDINGS: Complex comminuted multipart fracture of the humeral head and neck. Largely transverse oblique fracture through the humeral neck with maximum displacement of 19 mm. Moderate depression is also noted with a maximum of 19 mm. Relatively nondisplaced fracture of the greater tuberosity. There is a partial head split fracture noted along the anterior aspect of the humeral head. Nondisplaced fracture through the base of the lesser tuberosity. The humeral head is internally rotated and mildly subluxed posteriorly compared to the glenoid. No glenoid fracture. The [facility] joint is intact. Type 2-3 acromion but no lateral downsloping or subacromial spurring. Expected large joint effusion/hemarthrosis. Grossly by CT the rotator cuff tendons are intact. The rotator cuff muscles do not demonstrate any fatty atrophy. The visualized right ribs are intact. No scapular or clavicle fractures. The visualized right lung is grossly clear. IMPRESSION: 1. Complex comminuted multipart fracture of the humeral head  and neck as described above. 2. No glenoid fracture. 3. Grossly by CT the rotator cuff tendons are intact. 4. Expected large joint effusion/hemarthrosis. Electronically Signed   By: Rudie Meyer M.D.   On: 09/21/2023 16:03   XR Shoulder Right  Result Date: 09/20/2023 X-rays of the right shoulder show interval displacement of the proximal humerus fracture.  The humeral head is located within the glenohumeral joint.  DG Shoulder Right  Result Date: 09/08/2023 CLINICAL DATA:  Trauma, fall with right shoulder pain. EXAM: RIGHT SHOULDER - 2+ VIEW COMPARISON:  10/29/2015. FINDINGS: There is a mildly displaced comminuted fracture of the humeral head/neck. The remaining bony structures are intact and there is no dislocation. Mild degenerative changes are present at the glenohumeral and acromioclavicular joints.  The soft tissues are within normal limits. IMPRESSION: Mildly displaced comminuted fracture of the humeral head/neck. Electronically Signed   By: Thornell Sartorius M.D.   On: 09/08/2023 20:14    Recent Labs: Lab Results  Component Value Date   WBC 9.7 09/08/2023   HGB 15.0 09/08/2023   PLT 193 09/08/2023   NA 140 09/08/2023   K 3.9 09/08/2023   CL 103 09/08/2023   CO2 22 09/08/2023   GLUCOSE 124 (H) 09/08/2023   BUN 11 09/08/2023   CREATININE 0.91 09/08/2023   BILITOT 0.4 06/06/2023   ALKPHOS 407 (H) 07/18/2022   AST 19 06/06/2023   ALT 17 06/06/2023   PROT 7.2 06/06/2023   ALBUMIN 2.7 (L) 07/18/2022   CALCIUM 9.0 09/08/2023   QFTBGOLDPLUS NEGATIVE 03/20/2023    Speciality Comments: No specialty comments available.  Procedures:  No procedures performed Allergies: Timothy grass pollen allergen   Assessment / Plan:     Visit Diagnoses: Psoriatic arthritis ([name]) - Plan: Secukinumab (COSENTYX UNOREADY) 300 MG/2ML SOAJ  From reported symptoms arthritis has appear to be improving as well with the Cosentyx.  Currently joint problems in exacerbation after recent fall and humeral  fracture.  Plan to continue the Cosentyx 300 mg subcu monthly.  Psoriasis - Plan: Secukinumab (COSENTYX UNOREADY) 300 MG/2ML SOAJ  Psoriasis appears completely resolved or to an extent I cannot appreciate lesions on exam. Requiring any topical steroid treatment.  High risk medication use - Plan: Secukinumab (COSENTYX UNOREADY) 300 MG/2ML SOAJ  Tolerating medicine well no serious interval infections.  Review of recent labs from October 25 when being evaluated for his broken arm include normal CBC and BMP.  Orders: No orders of the defined types were placed in this encounter.  Meds ordered this encounter  Medications   Secukinumab (COSENTYX UNOREADY) 300 MG/2ML SOAJ    Sig: Inject 300 mg into the skin every 28 (twenty-eight) days. On 04/09/23 then every 4 weeks thereafter    Dispense:  2 mL    Refill:  2     Follow-Up Instructions: Return in about 3 months (around 12/19/2023) for PsA on COS f/u 3mos.   Fuller Plan, MD  Note - This record has been created using [employer].  Chart creation errors have been sought, but may not always  have been located. Such creation errors do not reflect on  the standard of medical care.

## 2023-09-19 ENCOUNTER — Other Ambulatory Visit: Payer: Self-pay

## 2023-09-19 ENCOUNTER — Other Ambulatory Visit (HOSPITAL_COMMUNITY): Payer: Self-pay

## 2023-09-19 ENCOUNTER — Other Ambulatory Visit (HOSPITAL_COMMUNITY): Payer: Self-pay | Admitting: Pharmacy Technician

## 2023-09-19 NOTE — Progress Notes (Signed)
Specialty Pharmacy Refill Coordination Note  Ryan Lynch is a 42 y.o. male contacted today regarding refills of specialty medication(s) Secukinumab   Patient requested Delivery   Delivery date: 10/03/23   Verified address: [address] [name]   Medication will be filled on 10/02/23.

## 2023-09-20 ENCOUNTER — Other Ambulatory Visit: Payer: Self-pay

## 2023-09-20 ENCOUNTER — Encounter: Payer: Self-pay | Admitting: Orthopaedic Surgery

## 2023-09-20 ENCOUNTER — Ambulatory Visit (INDEPENDENT_AMBULATORY_CARE_PROVIDER_SITE_OTHER): Payer: BC Managed Care – PPO | Admitting: Orthopaedic Surgery

## 2023-09-20 ENCOUNTER — Other Ambulatory Visit (INDEPENDENT_AMBULATORY_CARE_PROVIDER_SITE_OTHER): Payer: BC Managed Care – PPO

## 2023-09-20 DIAGNOSIS — S42291A Other displaced fracture of upper end of right humerus, initial encounter for closed fracture: Secondary | ICD-10-CM | POA: Diagnosis not present

## 2023-09-20 NOTE — Progress Notes (Addendum)
Office Visit Note   Patient: Ryan Lynch           Date of Birth: [birth year]           MRN: [id number] Visit Date: 09/20/2023              Requested by: No referring provider defined for this encounter. PCP: Patient, No Pcp Per   Assessment & Plan: Visit Diagnoses:  1. Other closed displaced fracture of proximal end of right humerus, initial encounter     Plan: Ryan Lynch a 42 year old gentleman with a complex right proximal humerus fracture.  Will obtain a CT scan with 3D recon for better fracture characterization and follow-up with Dr. August Saucer this coming Monday to discuss surgical treatment.  Follow-Up Instructions: No follow-ups on file.   Orders:  Orders Placed This Encounter  Procedures   XR Shoulder Right   CT SHOULDER RIGHT WO CONTRAST   No orders of the defined types were placed in this encounter.     Procedures: No procedures performed   Clinical Data: No additional findings.   Subjective: Chief Complaint  Patient presents with   Right Shoulder - Pain    DOI 09/08/2023    HPI Ryan Lynch is a 42 year old gentleman comes in as a ER follow-up for right proximal humerus fracture.  Had mechanical fall on 09/08/2023 when he tripped over a voting sign.  He states that he recently bumped his elbow and feels like the fracture may have shifted.  He is right-hand dominant.  Review of Systems  Constitutional: Negative.   HENT: Negative.    Eyes: Negative.   Respiratory: Negative.    Cardiovascular: Negative.   Gastrointestinal: Negative.   Endocrine: Negative.   Genitourinary: Negative.   Skin: Negative.   Allergic/Immunologic: Negative.   Neurological: Negative.   Hematological: Negative.   Psychiatric/Behavioral: Negative.    All other systems reviewed and are negative.    Objective: Vital Signs: There were no vitals taken for this visit.  Physical Exam Vitals and nursing note reviewed.  Constitutional:      Appearance: He is well-developed.  Pulmonary:      Effort: Pulmonary effort is normal.  Abdominal:     Palpations: Abdomen is soft.  Skin:    General: Skin is warm.  Neurological:     Mental Status: He is alert and oriented to person, place, and time.  Psychiatric:        Behavior: Behavior normal.        Thought Content: Thought content normal.        Judgment: Judgment normal.     Ortho Exam Exam of the right shoulder girdle shows extensive swelling and bruising.  No neurovascular compromise. Specialty Comments:  No specialty comments available.  Imaging: XR Shoulder Right  Result Date: 09/20/2023 X-rays of the right shoulder show interval displacement of the proximal humerus fracture.  The humeral head is located within the glenohumeral joint.    PMFS History: Patient Active Problem List   Diagnosis Date Noted   Psoriatic arthritis ([name]) 03/20/2023   High risk medication use 03/20/2023   Vitamin D deficiency 03/20/2023   Generalized anxiety disorder    Closed nondisplaced fracture of left tibial plateau 07/16/2022   Multiple rib fractures 07/11/2022   Compression fracture of body of thoracic vertebra ([name]) 07/11/2022   Sepsis ([name]) 07/09/2022   Acute urinary retention 07/09/2022   Prolonged QT interval 07/09/2022   AKI (acute kidney injury) ([name]) 07/07/2022   Alcohol withdrawal ([name]) 07/05/2022  Acute anemia 07/05/2022   Psoriasis 07/05/2022   Ileus ([name]) 07/04/2022   Acute metabolic encephalopathy 07/04/2022   Traumatic rhabdomyolysis ([name]) 07/03/2022   Observation after surgery 07/02/2022   Tibial plateau fracture, left 07/02/2022   Alcohol use 07/02/2022   Transaminitis 07/02/2022   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[name]) 07/02/2022   Adrenal nodule ([name]) 07/02/2022   Compression fracture of T7 vertebra ([name]) 07/02/2022   Thrombocytopenia ([name]) 07/02/2022   Anxiety 07/02/2022   Past Medical History:  Diagnosis Date   ADD (attention deficit disorder)    Broken humerus    Psoriasis    Psoriatic  arthritis ([name])     Family History  Problem Relation Age of Onset   Osteoporosis Mother    Arthritis Father     Past Surgical History:  Procedure Laterality Date   ANTERIOR CRUCIATE LIGAMENT REPAIR Right    ANTERIOR CRUCIATE LIGAMENT REPAIR Right    APPLICATION OF WOUND VAC Left 07/02/2022   Procedure: APPLICATION OF WOUND VAC;  Surgeon: Myrene Galas, MD;  Location: [facility] OR;  Service: Orthopedics;  Laterality: Left;   APPLICATION OF WOUND VAC Left 07/05/2022   Procedure: APPLICATION OF WOUND VAC;  Surgeon: Myrene Galas, MD;  Location: [facility] OR;  Service: Orthopedics;  Laterality: Left;   CLOSED REDUCTION TIBIA Left 07/02/2022   Procedure: CLOSED REDUCTION TIBIA;  Surgeon: Myrene Galas, MD;  Location: [facility] OR;  Service: Orthopedics;  Laterality: Left;   EXTERNAL FIXATION LEG Left 07/02/2022   Procedure: EXTERNAL FIXATION LEG;  Surgeon: Myrene Galas, MD;  Location: [facility] OR;  Service: Orthopedics;  Laterality: Left;   FASCIOTOMY Left 07/02/2022   Procedure: FASCIOTOMY;  Surgeon: Myrene Galas, MD;  Location: [facility] OR;  Service: Orthopedics;  Laterality: Left;   I & D EXTREMITY Left 07/07/2022   Procedure: IRRIGATION AND DEBRIDEMENT EXTREMITY;  Surgeon: Myrene Galas, MD;  Location: [facility] OR;  Service: Orthopedics;  Laterality: Left;   ORIF TIBIA PLATEAU Left 07/05/2022   Procedure: OPEN REDUCTION INTERNAL FIXATION (ORIF) TIBIAL PLATEAU;  Surgeon: Myrene Galas, MD;  Location: [facility] OR;  Service: Orthopedics;  Laterality: Left;   SECONDARY CLOSURE OF WOUND Left 07/05/2022   Procedure: SECONDARY CLOSURE OF WOUND;  Surgeon: Myrene Galas, MD;  Location: [facility] OR;  Service: Orthopedics;  Laterality: Left;   SECONDARY CLOSURE OF WOUND Left 07/07/2022   Procedure: SECONDARY CLOSURE OF WOUND;  Surgeon: Myrene Galas, MD;  Location: [facility] OR;  Service: Orthopedics;  Laterality: Left;   SHOULDER SURGERY Bilateral    WISDOM TOOTH EXTRACTION  05/23/2023   Social History   Occupational History   Not on file   Tobacco Use   Smoking status: Former    Types: Cigarettes    Passive exposure: Past   Smokeless tobacco: Former  Building services engineer status: Never Used  Substance and Sexual Activity   Alcohol use: Yes    Comment: holidays   Drug use: Never   Sexual activity: Not on file

## 2023-09-20 NOTE — Progress Notes (Signed)
Specialty Pharmacy Ongoing Clinical Assessment Note  Ryan Lynch is a 42 y.o. male who is being followed by the specialty pharmacy service for RxSp Psoriatic Arthritis   Patient's specialty medication(s) reviewed today: Secukinumab   Missed doses in the last 4 weeks: 0   Patient/Caregiver did not have any additional questions or concerns.   Therapeutic benefit summary: Patient is achieving benefit   Adverse events/side effects summary: No adverse events/side effects   Patient's therapy is appropriate to: Continue    Goals Addressed             This Visit's Progress    Minimize recurrence of flares       Patient is on track. Patient will maintain adherence         Follow up:  6 months  Ryan Lynch E [facility] Specialty Pharmacist

## 2023-09-21 ENCOUNTER — Ambulatory Visit
Admission: RE | Admit: 2023-09-21 | Discharge: 2023-09-21 | Disposition: A | Discharge status: Home / Self Care | Payer: BC Managed Care – PPO | Source: Ambulatory Visit | Attending: Orthopaedic Surgery | Admitting: Orthopaedic Surgery

## 2023-09-21 DIAGNOSIS — S42291A Other displaced fracture of upper end of right humerus, initial encounter for closed fracture: Secondary | ICD-10-CM

## 2023-09-21 DIAGNOSIS — S42254A Nondisplaced fracture of greater tuberosity of right humerus, initial encounter for closed fracture: Secondary | ICD-10-CM | POA: Diagnosis not present

## 2023-09-21 DIAGNOSIS — S42264A Nondisplaced fracture of lesser tuberosity of right humerus, initial encounter for closed fracture: Secondary | ICD-10-CM | POA: Diagnosis not present

## 2023-09-22 NOTE — Progress Notes (Signed)
This is that proximal humerus fracture I showed you.  Thanks for seeing him.

## 2023-09-25 ENCOUNTER — Encounter (HOSPITAL_COMMUNITY): Payer: Self-pay | Admitting: Vascular Surgery

## 2023-09-25 ENCOUNTER — Telehealth: Payer: Self-pay

## 2023-09-25 ENCOUNTER — Telehealth: Payer: Self-pay | Admitting: Orthopedic Surgery

## 2023-09-25 ENCOUNTER — Encounter (HOSPITAL_COMMUNITY): Payer: Self-pay | Admitting: Orthopedic Surgery

## 2023-09-25 ENCOUNTER — Other Ambulatory Visit: Payer: Self-pay

## 2023-09-25 ENCOUNTER — Ambulatory Visit: Payer: BC Managed Care – PPO | Admitting: Orthopedic Surgery

## 2023-09-25 NOTE — Progress Notes (Signed)
SDW CALL  Patient was given pre-op instructions over the phone. The opportunity was given for the patient to ask questions. No further questions asked. Patient verbalized understanding of instructions given.   PCP - pt states he does not have a PCP Cardiologist - denies  PPM/ICD - denies  Chest x-ray - denies EKG - 09/08/23 Stress Test - denies ECHO - denies Cardiac Cath - denies  Sleep Study - denies    ERAS Protcol - clears until 1200  COVID TEST-  denies   Anesthesia review: no  Patient denies shortness of breath, fever, cough and chest pain over the phone call   All instructions explained to the patient, with a verbal understanding of the material. Patient agrees to go over the instructions while at home for a better understanding.    Patient states that he has not had alcohol since his injury in August of 2023.

## 2023-09-25 NOTE — Telephone Encounter (Signed)
I cxed surgery pls have him come in wed thx

## 2023-09-25 NOTE — Telephone Encounter (Signed)
IC LMVM for patient asking if he could come in per Dr Deans request to see him at 1230 to discuss proximal humerus fracture.

## 2023-09-25 NOTE — Telephone Encounter (Signed)
Pt called to set an appt. Please call pt at [phone].

## 2023-09-25 NOTE — Progress Notes (Signed)
Surgical Instructions   Your procedure is scheduled on Tuesday, November 12th, 2024. Report to [facility] Main Entrance "A" at 12:30 A.M., then check in with the Admitting office.   The address is [address] [place]. Any questions or running late day of surgery: call [phone]  Questions prior to your surgery date: call [phone], Monday-Friday, 8am-4pm. If you experience any cold or flu symptoms such as cough, fever, chills, shortness of breath, etc. between now and your scheduled surgery, please notify us at the above number.     Remember:  Do not eat after midnight the night before your surgery   You may drink clear liquids until 12:00 PM the day of your surgery.   Clear liquids allowed are: Water, Non-Citrus Juices (without pulp), Carbonated Beverages, Clear Tea (no milk, honey, etc.), Black Coffee Only (NO MILK, CREAM OR POWDERED CREAMER of any kind), and Gatorade.    Take these medicines the morning of surgery with A SIP OF WATER: Gabapentin Zoloft   STOP taking any Aspirin (unless otherwise instructed by your surgeon) Aleve, Naproxen, Ibuprofen, Motrin, Advil, Goody's, BC's, all herbal medications, fish oil, and non-prescription vitamins.                     Do NOT Smoke (Tobacco/Vaping) for 24 hours prior to your procedure.    You will be asked to remove any contacts, glasses, piercing's, hearing aid's, dentures/partials prior to surgery. Please bring cases for these items if needed.    Patients discharged the day of surgery will not be allowed to drive home, and someone needs to stay with them for 24 hours.  SURGICAL WAITING ROOM VISITATION Patients may have no more than 2 support people in the waiting area - these visitors may rotate.   Pre-op nurse will coordinate an appropriate time for 1 ADULT support person, who may not rotate, to accompany patient in pre-op.  Children under the age of 57 must have an adult with them who is not the patient and  must remain in the main waiting area with an adult.  If the patient needs to stay at the hospital during part of their recovery, the visitor guidelines for inpatient rooms apply.  Please refer to the [facility] website for the visitor guidelines for any additional information.

## 2023-09-25 NOTE — Anesthesia Preprocedure Evaluation (Signed)
Anesthesia Evaluation    Airway        Dental   Pulmonary former smoker          Cardiovascular      Neuro/Psych    GI/Hepatic   Endo/Other    Renal/GU      Musculoskeletal   Abdominal   Peds  Hematology   Anesthesia Other Findings   Reproductive/Obstetrics                             Anesthesia Physical Anesthesia Plan  ASA:   Anesthesia Plan:    Post-op Pain Management:    Induction:   PONV Risk Score and Plan:   Airway Management Planned:   Additional Equipment:   Intra-op Plan:   Post-operative Plan:   Informed Consent:   Plan Discussed with:   Anesthesia Plan Comments: (History includes several week hospitalization ~ 06/2022 for fall with polytrauma including rib fractures compression fracture and tibial plateau fracture with emergent fasciotomy left lower extremity secondary to compartment syndrome on admission. He had hospital-acquired delirium likely secondary to alcoholism and withdrawal from BZD, post operative ileus and urinary retention. Shonna Chock, PA-C )       Anesthesia Quick Evaluation

## 2023-09-26 ENCOUNTER — Ambulatory Visit (HOSPITAL_COMMUNITY)
Admission: RE | Admit: 2023-09-26 | Payer: BC Managed Care – PPO | Source: Home / Self Care | Admitting: Orthopedic Surgery

## 2023-09-26 DIAGNOSIS — Z01818 Encounter for other preprocedural examination: Secondary | ICD-10-CM

## 2023-09-26 SURGERY — OPEN REDUCTION INTERNAL FIXATION (ORIF) PROXIMAL HUMERUS FRACTURE
Anesthesia: General | Site: Shoulder | Laterality: Right

## 2023-09-27 ENCOUNTER — Other Ambulatory Visit (INDEPENDENT_AMBULATORY_CARE_PROVIDER_SITE_OTHER): Payer: BC Managed Care – PPO

## 2023-09-27 ENCOUNTER — Ambulatory Visit: Payer: BC Managed Care – PPO | Admitting: Orthopedic Surgery

## 2023-09-27 DIAGNOSIS — S42291A Other displaced fracture of upper end of right humerus, initial encounter for closed fracture: Secondary | ICD-10-CM

## 2023-09-28 ENCOUNTER — Other Ambulatory Visit: Payer: Self-pay

## 2023-09-28 LAB — VITAMIN D 25 HYDROXY (VIT D DEFICIENCY, FRACTURES): Vit D, 25-Hydroxy: 15 ng/mL — ABNORMAL LOW (ref 30–100)

## 2023-09-28 NOTE — Progress Notes (Signed)
Pls send in vit d 10  k units q d for 10 days then 5 k units every day for 4  weeks and let him know it is low thx

## 2023-09-29 ENCOUNTER — Other Ambulatory Visit: Payer: Self-pay

## 2023-09-29 ENCOUNTER — Telehealth: Payer: Self-pay

## 2023-09-29 MED ORDER — VITAMIN D-3 125 MCG (5000 UT) PO TABS: ORAL_TABLET | ORAL | 0 refills | Status: AC

## 2023-09-29 NOTE — Telephone Encounter (Signed)
-----   Message from Burnard Bunting sent at 09/28/2023  2:46 PM EST ----- Pls send in vit d 10  k units q d for 10 days then 5 k units every day for 4  weeks and let him know it is low thx

## 2023-09-29 NOTE — Telephone Encounter (Signed)
Medication submitted to pharmacy. Patient advised of results and will pick up medication today. Verbalized understanding.

## 2023-09-30 ENCOUNTER — Encounter: Payer: Self-pay | Admitting: Orthopedic Surgery

## 2023-09-30 NOTE — Progress Notes (Unsigned)
Office Visit Note   Patient: Ryan Lynch           Date of Birth: 01/12/1981           MRN: [id number] Visit Date: 09/27/2023 Requested by: No referring provider defined for this encounter. PCP: Patient, No Pcp Per  Subjective: Chief Complaint  Patient presents with   Right Shoulder - Fracture, Follow-up    HPI: Ryan Lynch is a 42 y.o. male who presents to the office reporting right arm pain.  He is about 2 and half weeks out from right proximal humerus fracture.  He is undergone prior ACL reconstruction by me years ago.  Since he was last seen he has had a CT scan which is reviewed.  He does have a comminuted proximal humerus fracture with about 1/2 cm of translational displacement.  Overall the head remains located although some fracture lines do extend up and around the tuberosities..                ROS: All systems reviewed are negative as they relate to the chief complaint within the history of present illness.  Patient denies fevers or chills.  Assessment & Plan: Visit Diagnoses:  1. Other closed displaced fracture of proximal end of right humerus, initial encounter     Plan: Impression is complex proximal humerus fracture with general reasonable alignment in terms of the articular surface and glenoid.  There are some comminution around the metaphysis but improving that with surgery runs the risk of creating or worsening what may be an avascular necrosis picture in the humeral head.  This is particularly true considering his alcohol use.  Radiographs today do not show any change in displacement compared to the CT scan and radiographs from last week.  Vitamin D level also low at 15 at the time of this dictation.  All these factors mitigate against surgical fixation right now.  On exam the fracture itself is not grossly unstable and does have some early healing.  Vitamin D is supplemented and we will see him back in a week for clinical recheck primarily evaluating fracture stability  with rotational stress.  He will also need radiographs at that time to check for any further displacement.  Surgical fixation is not completely off the table but for now I think nonoperative therapy is indicated to see if this can heal.  If it can heal in its current position I think he would have reasonable but not perfect shoulder function.  Follow-Up Instructions: No follow-ups on file.   Orders:  Orders Placed This Encounter  Procedures   XR Shoulder Right   VITAMIN D 25 Hydroxy (Vit-D Deficiency, Fractures)   No orders of the defined types were placed in this encounter.     Procedures: No procedures performed   Clinical Data: No additional findings.  Objective: Vital Signs: There were no vitals taken for this visit.  Physical Exam:  Constitutional: Patient appears well-developed HEENT:  Head: Normocephalic Eyes:EOM are normal Neck: Normal range of motion Cardiovascular: Normal rate Pulmonary/chest: Effort normal Neurologic: Patient is alert Skin: Skin is warm Psychiatric: Patient has normal mood and affect  Ortho Exam: Ortho exam demonstrates functional deltoid.  Slight amount of crepitus at the fracture site with rotation.  Motor or sensory function to the hand is intact.  Radial pulse intact.  Specialty Comments:  No specialty comments available.  Imaging: No results found.   PMFS History: Patient Active Problem List   Diagnosis Date Noted  Psoriatic arthritis ([name]) 03/20/2023   High risk medication use 03/20/2023   Vitamin D deficiency 03/20/2023   Generalized anxiety disorder    Closed nondisplaced fracture of left tibial plateau 07/16/2022   Multiple rib fractures 07/11/2022   Compression fracture of body of thoracic vertebra ([name]) 07/11/2022   Sepsis ([name]) 07/09/2022   Acute urinary retention 07/09/2022   Prolonged QT interval 07/09/2022   AKI (acute kidney injury) ([name]) 07/07/2022   Alcohol withdrawal ([name]) 07/05/2022   Acute anemia 07/05/2022    Psoriasis 07/05/2022   Ileus ([name]) 07/04/2022   Acute metabolic encephalopathy 07/04/2022   Traumatic rhabdomyolysis ([name]) 07/03/2022   Observation after surgery 07/02/2022   Tibial plateau fracture, left 07/02/2022   Alcohol use 07/02/2022   Transaminitis 07/02/2022   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[name]) 07/02/2022   Adrenal nodule ([name]) 07/02/2022   Compression fracture of T7 vertebra ([name]) 07/02/2022   Thrombocytopenia ([name]) 07/02/2022   Anxiety 07/02/2022   Past Medical History:  Diagnosis Date   Acute anemia 06/2022   Acute metabolic encephalopathy 06/2022   Acute urinary retention 06/2022   ADD (attention deficit disorder)    Adrenal nodule ([name]) 06/2022   AKI (acute kidney injury) ([name]) 06/2022   Broken humerus    Closed nondisplaced fracture of left tibial plateau 07/2022   Multiple rib fractures 06/2022   Psoriasis    Psoriatic arthritis ([name])    Thrombocytopenia ([name]) 06/2022   Traumatic rhabdomyolysis ([name]) 06/2022    Family History  Problem Relation Age of Onset   Osteoporosis Mother    Arthritis Father     Past Surgical History:  Procedure Laterality Date   ANTERIOR CRUCIATE LIGAMENT REPAIR Right    ANTERIOR CRUCIATE LIGAMENT REPAIR Right    APPLICATION OF WOUND VAC Left 07/02/2022   Procedure: APPLICATION OF WOUND VAC;  Surgeon: Myrene Galas, MD;  Location: [facility] OR;  Service: Orthopedics;  Laterality: Left;   APPLICATION OF WOUND VAC Left 07/05/2022   Procedure: APPLICATION OF WOUND VAC;  Surgeon: Myrene Galas, MD;  Location: [facility] OR;  Service: Orthopedics;  Laterality: Left;   CLOSED REDUCTION TIBIA Left 07/02/2022   Procedure: CLOSED REDUCTION TIBIA;  Surgeon: Myrene Galas, MD;  Location: [facility] OR;  Service: Orthopedics;  Laterality: Left;   EXTERNAL FIXATION LEG Left 07/02/2022   Procedure: EXTERNAL FIXATION LEG;  Surgeon: Myrene Galas, MD;  Location: [facility] OR;  Service: Orthopedics;  Laterality: Left;   FASCIOTOMY Left 07/02/2022   Procedure:  FASCIOTOMY;  Surgeon: Myrene Galas, MD;  Location: [facility] OR;  Service: Orthopedics;  Laterality: Left;   I & D EXTREMITY Left 07/07/2022   Procedure: IRRIGATION AND DEBRIDEMENT EXTREMITY;  Surgeon: Myrene Galas, MD;  Location: [facility] OR;  Service: Orthopedics;  Laterality: Left;   ORIF TIBIA PLATEAU Left 07/05/2022   Procedure: OPEN REDUCTION INTERNAL FIXATION (ORIF) TIBIAL PLATEAU;  Surgeon: Myrene Galas, MD;  Location: [facility] OR;  Service: Orthopedics;  Laterality: Left;   SECONDARY CLOSURE OF WOUND Left 07/05/2022   Procedure: SECONDARY CLOSURE OF WOUND;  Surgeon: Myrene Galas, MD;  Location: [facility] OR;  Service: Orthopedics;  Laterality: Left;   SECONDARY CLOSURE OF WOUND Left 07/07/2022   Procedure: SECONDARY CLOSURE OF WOUND;  Surgeon: Myrene Galas, MD;  Location: [facility] OR;  Service: Orthopedics;  Laterality: Left;   SHOULDER SURGERY Bilateral    WISDOM TOOTH EXTRACTION  05/23/2023   Social History   Occupational History   Not on file  Tobacco Use   Smoking status: Former    Types: Cigarettes  Passive exposure: Past   Smokeless tobacco: Former  Building services engineer status: Never Used  Substance and Sexual Activity   Alcohol use: Yes    Comment: Not since 06/2022   Drug use: Never   Sexual activity: Not on file

## 2023-10-02 ENCOUNTER — Other Ambulatory Visit (HOSPITAL_COMMUNITY): Payer: Self-pay

## 2023-10-06 ENCOUNTER — Other Ambulatory Visit (INDEPENDENT_AMBULATORY_CARE_PROVIDER_SITE_OTHER): Payer: BC Managed Care – PPO

## 2023-10-06 ENCOUNTER — Ambulatory Visit: Payer: BC Managed Care – PPO | Admitting: Surgical

## 2023-10-06 DIAGNOSIS — S42291A Other displaced fracture of upper end of right humerus, initial encounter for closed fracture: Secondary | ICD-10-CM

## 2023-10-09 ENCOUNTER — Encounter: Payer: Self-pay | Admitting: Surgical

## 2023-10-09 ENCOUNTER — Ambulatory Visit: Payer: BC Managed Care – PPO | Admitting: Orthopedic Surgery

## 2023-10-09 ENCOUNTER — Encounter: Payer: BC Managed Care – PPO | Admitting: Surgical

## 2023-10-09 NOTE — Progress Notes (Signed)
Post-fracture visit Note   Patient: Ryan Lynch           Date of Birth: 01/12/1981           MRN: [id number] Visit Date: 10/06/2023 PCP: Patient, No Pcp Per   Assessment & Plan:  Chief Complaint:  Chief Complaint  Patient presents with   Right Shoulder - Follow-up, Fracture    DOI 09/08/2023   Visit Diagnoses:  1. Other closed displaced fracture of proximal end of right humerus, initial encounter     Plan: Patient is a 42 year old male who returns for 1 week visit following proximal humerus fracture sustained on 09/08/2023.  He is close to 1 month out.  Doing well overall and feels that pain is slowly improving.  He is sleeping better at night.  Really does not have any pain at rest.  Has been remaining in the sling.  Started the vitamin D that was prescribed for him about 3 days ago.  No numbness or tingling.  No radicular pain in the arm.  He does not smoke or vape.  He is not coming out of the sling at any time.  On exam, patient has axillary nerve intact with deltoid firing.  Intact EPL, FPL, finger abduction.  Palpable radial pulse of the right upper extremity.  He has minimal pain with passive motion of the right shoulder below shoulder level and the fracture seems to be moving as 1 unit with passive motion.  Radiographs demonstrate no significant change in alignment of the fracture compared with last set of radiographs.  At this point it seems that the fracture is in the early stages of healing and continuing with nonoperative management is acceptable.  He understands that he may lose some function of this shoulder long-term in terms of decreased active and passive motion but he would still like to continue without surgery.  Plan to continue with sling immobilization but he is okay to come out of the sling once a day in order to work on pendulum exercises 30 repetitions clockwise and then 30 repetitions counterclockwise.  He will go back in the sling at that point.  Okay to come out  of the sling a couple times a day in order to work on elbow range of motion as well in order to prevent elbow stiffness.  Follow-up in 3 weeks for clinical recheck and likely discontinue the sling at that time.  Will need radiographs at the next visit.  Follow-Up Instructions: Return in about 3 weeks (around 10/27/2023).   Orders:  Orders Placed This Encounter  Procedures   XR Shoulder Right   No orders of the defined types were placed in this encounter.   Imaging: No results found.  PMFS History: Patient Active Problem List   Diagnosis Date Noted   Psoriatic arthritis ([name]) 03/20/2023   High risk medication use 03/20/2023   Vitamin D deficiency 03/20/2023   Generalized anxiety disorder    Closed nondisplaced fracture of left tibial plateau 07/16/2022   Multiple rib fractures 07/11/2022   Compression fracture of body of thoracic vertebra ([name]) 07/11/2022   Sepsis ([name]) 07/09/2022   Acute urinary retention 07/09/2022   Prolonged QT interval 07/09/2022   AKI (acute kidney injury) ([name]) 07/07/2022   Alcohol withdrawal ([name]) 07/05/2022   Acute anemia 07/05/2022   Psoriasis 07/05/2022   Ileus ([name]) 07/04/2022   Acute metabolic encephalopathy 07/04/2022   Traumatic rhabdomyolysis ([name]) 07/03/2022   Observation after surgery 07/02/2022   Tibial plateau fracture, left  07/02/2022   Alcohol use 07/02/2022   Transaminitis 07/02/2022   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[name]) 07/02/2022   Adrenal nodule ([name]) 07/02/2022   Compression fracture of T7 vertebra ([name]) 07/02/2022   Thrombocytopenia ([name]) 07/02/2022   Anxiety 07/02/2022   Past Medical History:  Diagnosis Date   Acute anemia 06/2022   Acute metabolic encephalopathy 06/2022   Acute urinary retention 06/2022   ADD (attention deficit disorder)    Adrenal nodule ([name]) 06/2022   AKI (acute kidney injury) ([name]) 06/2022   Broken humerus    Closed nondisplaced fracture of left tibial plateau 07/2022   Multiple rib fractures  06/2022   Psoriasis    Psoriatic arthritis ([name])    Thrombocytopenia ([name]) 06/2022   Traumatic rhabdomyolysis ([name]) 06/2022    Family History  Problem Relation Age of Onset   Osteoporosis Mother    Arthritis Father     Past Surgical History:  Procedure Laterality Date   ANTERIOR CRUCIATE LIGAMENT REPAIR Right    ANTERIOR CRUCIATE LIGAMENT REPAIR Right    APPLICATION OF WOUND VAC Left 07/02/2022   Procedure: APPLICATION OF WOUND VAC;  Surgeon: Myrene Galas, MD;  Location: [facility] OR;  Service: Orthopedics;  Laterality: Left;   APPLICATION OF WOUND VAC Left 07/05/2022   Procedure: APPLICATION OF WOUND VAC;  Surgeon: Myrene Galas, MD;  Location: [facility] OR;  Service: Orthopedics;  Laterality: Left;   CLOSED REDUCTION TIBIA Left 07/02/2022   Procedure: CLOSED REDUCTION TIBIA;  Surgeon: Myrene Galas, MD;  Location: [facility] OR;  Service: Orthopedics;  Laterality: Left;   EXTERNAL FIXATION LEG Left 07/02/2022   Procedure: EXTERNAL FIXATION LEG;  Surgeon: Myrene Galas, MD;  Location: [facility] OR;  Service: Orthopedics;  Laterality: Left;   FASCIOTOMY Left 07/02/2022   Procedure: FASCIOTOMY;  Surgeon: Myrene Galas, MD;  Location: [facility] OR;  Service: Orthopedics;  Laterality: Left;   I & D EXTREMITY Left 07/07/2022   Procedure: IRRIGATION AND DEBRIDEMENT EXTREMITY;  Surgeon: Myrene Galas, MD;  Location: [facility] OR;  Service: Orthopedics;  Laterality: Left;   ORIF TIBIA PLATEAU Left 07/05/2022   Procedure: OPEN REDUCTION INTERNAL FIXATION (ORIF) TIBIAL PLATEAU;  Surgeon: Myrene Galas, MD;  Location: [facility];  Service: Orthopedics;  Laterality: Left;   SECONDARY CLOSURE OF WOUND Left 07/05/2022   Procedure: SECONDARY CLOSURE OF WOUND;  Surgeon: Myrene Galas, MD;  [facility]: [facility] OR;  Service: Orthopedics;  Laterality: Left;   SECONDARY CLOSURE OF WOUND Left 07/07/2022   Procedure: SECONDARY CLOSURE OF WOUND;  Surgeon: Myrene Galas, MD;  Location: [facility] OR;  Service: Orthopedics;  Laterality: Left;   SHOULDER  SURGERY Bilateral    WISDOM TOOTH EXTRACTION  05/23/2023   Social History   Occupational History   Not on file  Tobacco Use   Smoking status: Former    Types: Cigarettes    Passive exposure: Past   Smokeless tobacco: Former  Building services engineer status: Never Used  Substance and Sexual Activity   Alcohol use: Yes    Comment: Not since 06/2022   Drug use: Never   Sexual activity: Not on file

## 2023-10-23 DIAGNOSIS — F4010 Social phobia, unspecified: Secondary | ICD-10-CM | POA: Diagnosis not present

## 2023-10-23 DIAGNOSIS — F410 Panic disorder [episodic paroxysmal anxiety] without agoraphobia: Secondary | ICD-10-CM | POA: Diagnosis not present

## 2023-10-25 ENCOUNTER — Other Ambulatory Visit (HOSPITAL_COMMUNITY): Payer: Self-pay

## 2023-10-27 ENCOUNTER — Ambulatory Visit (INDEPENDENT_AMBULATORY_CARE_PROVIDER_SITE_OTHER): Payer: BC Managed Care – PPO | Admitting: Surgical

## 2023-10-27 ENCOUNTER — Other Ambulatory Visit (INDEPENDENT_AMBULATORY_CARE_PROVIDER_SITE_OTHER): Payer: Self-pay

## 2023-10-27 DIAGNOSIS — S42291A Other displaced fracture of upper end of right humerus, initial encounter for closed fracture: Secondary | ICD-10-CM

## 2023-10-28 ENCOUNTER — Encounter: Payer: Self-pay | Admitting: Surgical

## 2023-10-28 NOTE — Progress Notes (Signed)
Post-fracture Visit Note   Patient: Ryan Lynch           Date of Birth: [birth year]           MRN: [id number] Visit Date: 10/27/2023 PCP: Patient, No Pcp Per   Assessment & Plan:  Chief Complaint:  Chief Complaint  Patient presents with   Right Shoulder - Fracture, Follow-up       DOI 09/08/2023     Visit Diagnoses:  1. Other closed displaced fracture of proximal end of right humerus, initial encounter     Plan: Patient is a a 42 year old male who presents for evaluation of right proximal humerus fracture.  Date of injury was 09/08/2023.  He reports that he is doing well.  Pain is steadily improving.  He has no pain that is not controlled with occasional Advil.  He sleeps through the night without any difficulty.  He has been wearing his sling and coming out of the sling in order to do pendulum's and going back into the sling.  He has no mechanical symptoms with motion of the shoulder.  Denies any numbness or tingling.  On exam, axillary nerve is intact with deltoid firing.  He has intact EPL, FPL, finger abduction of the injured extremity.  2+ radial pulse of the right upper extremity.  He has passive motion of the right shoulder to 30 degrees external rotation, 65 degrees abduction, 140 degrees forward elevation with no significant pain.  The shoulder does move his 1 unit with no discernible crepitus or motion at the fracture site.  Plan is discontinue sling.  Strongly encourage patient to avoid any overhead range of motion or any lifting with the injured arm.  He can do some gentle below shoulder range of motion but with lack of callus formation at this time, hold off on anything further.  We will see him back in 3 weeks for clinical recheck with new radiographs at that time which will be approximately 2 months out from the injury.  If no callus formation at that point, could consider CT scan for further evaluation.  From a clinical perspective, he is healing with no motion at the  fracture site and minimal pain.  Anticipate eventual callus formation but this is probably delayed due to his initially low vitamin D level.  He is still taking vitamin D.  Follow-Up Instructions: No follow-ups on file.   Orders:  Orders Placed This Encounter  Procedures   XR Shoulder Right   No orders of the defined types were placed in this encounter.   Imaging: No results found.  PMFS History: Patient Active Problem List   Diagnosis Date Noted   Psoriatic arthritis ([name]) 03/20/2023   High risk medication use 03/20/2023   Vitamin D deficiency 03/20/2023   Generalized anxiety disorder    Closed nondisplaced fracture of left tibial plateau 07/16/2022   Multiple rib fractures 07/11/2022   Compression fracture of body of thoracic vertebra ([name]) 07/11/2022   Sepsis ([name]) 07/09/2022   Acute urinary retention 07/09/2022   Prolonged QT interval 07/09/2022   AKI (acute kidney injury) ([name]) 07/07/2022   Alcohol withdrawal ([name]) 07/05/2022   Acute anemia 07/05/2022   Psoriasis 07/05/2022   Ileus ([name]) 07/04/2022   Acute metabolic encephalopathy 07/04/2022   Traumatic rhabdomyolysis ([name]) 07/03/2022   Observation after surgery 07/02/2022   Tibial plateau fracture, left 07/02/2022   Alcohol use 07/02/2022   Transaminitis 07/02/2022   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[name]) 07/02/2022   Adrenal nodule ([name]) 07/02/2022  Compression fracture of T7 vertebra ([name]) 07/02/2022   Thrombocytopenia ([name]) 07/02/2022   Anxiety 07/02/2022   Past Medical History:  Diagnosis Date   Acute anemia 06/2022   Acute metabolic encephalopathy 06/2022   Acute urinary retention 06/2022   ADD (attention deficit disorder)    Adrenal nodule ([name]) 06/2022   AKI (acute kidney injury) ([name]) 06/2022   Broken humerus    Closed nondisplaced fracture of left tibial plateau 07/2022   Multiple rib fractures 06/2022   Psoriasis    Psoriatic arthritis ([name])    Thrombocytopenia ([name]) 06/2022   Traumatic  rhabdomyolysis ([name]) 06/2022    Family History  Problem Relation Age of Onset   Osteoporosis Mother    Arthritis Father     Past Surgical History:  Procedure Laterality Date   ANTERIOR CRUCIATE LIGAMENT REPAIR Right    ANTERIOR CRUCIATE LIGAMENT REPAIR Right    APPLICATION OF WOUND VAC Left 07/02/2022   Procedure: APPLICATION OF WOUND VAC;  Surgeon: Myrene Galas, MD;  Location: [facility] OR;  Service: Orthopedics;  Laterality: Left;   APPLICATION OF WOUND VAC Left 07/05/2022   Procedure: APPLICATION OF WOUND VAC;  Surgeon: Myrene Galas, MD;  Location: [facility] OR;  Service: Orthopedics;  Laterality: Left;   CLOSED REDUCTION TIBIA Left 07/02/2022   Procedure: CLOSED REDUCTION TIBIA;  Surgeon: Myrene Galas, MD;  Location: [facility] OR;  Service: Orthopedics;  Laterality: Left;   EXTERNAL FIXATION LEG Left 07/02/2022   Procedure: EXTERNAL FIXATION LEG;  Surgeon: Myrene Galas, MD;  Location: [facility] OR;  Service: Orthopedics;  Laterality: Left;   FASCIOTOMY Left 07/02/2022   Procedure: FASCIOTOMY;  Surgeon: Myrene Galas, MD;  Location: [facility] OR;  Service: Orthopedics;  Laterality: Left;   I & D EXTREMITY Left 07/07/2022   Procedure: IRRIGATION AND DEBRIDEMENT EXTREMITY;  Surgeon: Myrene Galas, MD;  Location: [facility] OR;  Service: Orthopedics;  Laterality: Left;   ORIF TIBIA PLATEAU Left 07/05/2022   Procedure: OPEN REDUCTION INTERNAL FIXATION (ORIF) TIBIAL PLATEAU;  Surgeon: Myrene Galas, MD;  Location: [facility] OR;  Service: Orthopedics;  Laterality: Left;   SECONDARY CLOSURE OF WOUND Left 07/05/2022   Procedure: SECONDARY CLOSURE OF WOUND;  Surgeon: Myrene Galas, MD;  Location: [facility] OR;  Service: Orthopedics;  Laterality: Left;   SECONDARY CLOSURE OF WOUND Left 07/07/2022   Procedure: SECONDARY CLOSURE OF WOUND;  Surgeon: Myrene Galas, MD;  Location: [facility] OR;  Service: Orthopedics;  Laterality: Left;   SHOULDER SURGERY Bilateral    WISDOM TOOTH EXTRACTION  05/23/2023   Social History   Occupational History    Not on file  Tobacco Use   Smoking status: Former    Types: Cigarettes    Passive exposure: Past   Smokeless tobacco: Former  Building services engineer status: Never Used  Substance and Sexual Activity   Alcohol use: Yes    Comment: Not since 06/2022   Drug use: Never   Sexual activity: Not on file

## 2023-10-30 ENCOUNTER — Other Ambulatory Visit: Payer: Self-pay

## 2023-11-01 ENCOUNTER — Other Ambulatory Visit: Payer: Self-pay

## 2023-11-17 ENCOUNTER — Other Ambulatory Visit (INDEPENDENT_AMBULATORY_CARE_PROVIDER_SITE_OTHER): Payer: Self-pay

## 2023-11-17 ENCOUNTER — Encounter: Payer: Self-pay | Admitting: Surgical

## 2023-11-17 ENCOUNTER — Ambulatory Visit (INDEPENDENT_AMBULATORY_CARE_PROVIDER_SITE_OTHER): Payer: BC Managed Care – PPO | Admitting: Surgical

## 2023-11-17 DIAGNOSIS — S42291A Other displaced fracture of upper end of right humerus, initial encounter for closed fracture: Secondary | ICD-10-CM

## 2023-11-17 NOTE — Progress Notes (Signed)
 Post-fracture Visit Note   Patient: Ryan Lynch           Date of Birth: 10-Jun-1981           MRN: [id number] Visit Date: 11/17/2023 PCP: Patient, No Pcp Per   Assessment & Plan:  Chief Complaint:  Chief Complaint  Patient presents with   Right Shoulder - Fracture, Follow-up        DOI 09/08/2023   Visit Diagnoses:  1. Other closed displaced fracture of proximal end of right humerus, initial encounter     Plan: Patient is a a 43 year old male who presents for evaluation of right proximal humerus fracture.  Date of injury was 09/08/2023.  He reports that he is doing well.  Pain is steadily improving.  Not taking any medication for pain.  He sleeps through the night without any difficulty.  Currently out of the sling.  Not doing any lifting.  Not doing any above shoulder level range of motion.  Denies any numbness or tingling.  On exam, axillary nerve is intact with deltoid firing.  He has intact EPL, FPL, finger abduction of the injured extremity.  2+ radial pulse of the right upper extremity.  He has passive motion of the right shoulder to 35 degrees external rotation, 80 degrees abduction, 140 degrees forward elevation with no significant pain.  The shoulder does move his 1 unit with no discernible crepitus or motion at the fracture site.  Plan is order CT scan of the right shoulder to further evaluate for nonunion.  He has radiographs taken today demonstrating no new callus formation still over 2 months out from injury.  Initially had low vitamin D  level which has increased his risk for nonunion.  Also was previously taking Cosentyx  for psoriatic arthritis and he has been directed to stop this by Dr. Jeannetta since this can interfere with fracture healing as well.  Follow-up after CT scan to review results with Dr. Addie.  May need to consider bone stimulator.  Strongly encourage patient to avoid any lifting with the injured extremity and stick to only below shoulder range of  motion.  Follow-Up Instructions: No follow-ups on file.   Orders:  Orders Placed This Encounter  Procedures   XR Shoulder Right   No orders of the defined types were placed in this encounter.   Imaging: No results found.  PMFS History: Patient Active Problem List   Diagnosis Date Noted   Psoriatic arthritis ([name]) 03/20/2023   High risk medication use 03/20/2023   Vitamin D  deficiency 03/20/2023   Generalized anxiety disorder    Closed nondisplaced fracture of left tibial plateau 07/16/2022   Multiple rib fractures 07/11/2022   Compression fracture of body of thoracic vertebra ([name]) 07/11/2022   Sepsis ([name]) 07/09/2022   Acute urinary retention 07/09/2022   Prolonged QT interval 07/09/2022   AKI (acute kidney injury) ([name]) 07/07/2022   Alcohol withdrawal ([name]) 07/05/2022   Acute anemia 07/05/2022   Psoriasis 07/05/2022   Ileus ([name]) 07/04/2022   Acute metabolic encephalopathy 07/04/2022   Traumatic rhabdomyolysis ([name]) 07/03/2022   Observation after surgery 07/02/2022   Tibial plateau fracture, left 07/02/2022   Alcohol use 07/02/2022   Transaminitis 07/02/2022   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[name]) 07/02/2022   Adrenal nodule ([name]) 07/02/2022   Compression fracture of T7 vertebra ([name]) 07/02/2022   Thrombocytopenia ([name]) 07/02/2022   Anxiety 07/02/2022   Past Medical History:  Diagnosis Date   Acute anemia 06/2022   Acute metabolic encephalopathy 06/2022  Acute urinary retention 06/2022   ADD (attention deficit disorder)    Adrenal nodule ([name]) 06/2022   AKI (acute kidney injury) ([name]) 06/2022   Broken humerus    Closed nondisplaced fracture of left tibial plateau 07/2022   Multiple rib fractures 06/2022   Psoriasis    Psoriatic arthritis ([name])    Thrombocytopenia ([name]) 06/2022   Traumatic rhabdomyolysis ([name]) 06/2022    Family History  Problem Relation Age of Onset   Osteoporosis Mother    Arthritis Father     Past Surgical History:  Procedure  Laterality Date   ANTERIOR CRUCIATE LIGAMENT REPAIR Right    ANTERIOR CRUCIATE LIGAMENT REPAIR Right    APPLICATION OF WOUND VAC Left 07/02/2022   Procedure: APPLICATION OF WOUND VAC;  Surgeon: Celena Sharper, MD;  Location: [facility] OR;  Service: Orthopedics;  Laterality: Left;   APPLICATION OF WOUND VAC Left 07/05/2022   Procedure: APPLICATION OF WOUND VAC;  Surgeon: Celena Sharper, MD;  Location: [facility] OR;  Service: Orthopedics;  Laterality: Left;   CLOSED REDUCTION TIBIA Left 07/02/2022   Procedure: CLOSED REDUCTION TIBIA;  Surgeon: Celena Sharper, MD;  Location: [facility] OR;  Service: Orthopedics;  Laterality: Left;   EXTERNAL FIXATION LEG Left 07/02/2022   Procedure: EXTERNAL FIXATION LEG;  Surgeon: Celena Sharper, MD;  Location: [facility] OR;  Service: Orthopedics;  Laterality: Left;   FASCIOTOMY Left 07/02/2022   Procedure: FASCIOTOMY;  Surgeon: Celena Sharper, MD;  Location: [facility] OR;  Service: Orthopedics;  Laterality: Left;   I & D EXTREMITY Left 07/07/2022   Procedure: IRRIGATION AND DEBRIDEMENT EXTREMITY;  Surgeon: Celena Sharper, MD;  Location: [facility] OR;  Service: Orthopedics;  Laterality: Left;   ORIF TIBIA PLATEAU Left 07/05/2022   Procedure: OPEN REDUCTION INTERNAL FIXATION (ORIF) TIBIAL PLATEAU;  Surgeon: Celena Sharper, MD;  Location: [facility] OR;  Service: Orthopedics;  Laterality: Left;   SECONDARY CLOSURE OF WOUND Left 07/05/2022   Procedure: SECONDARY CLOSURE OF WOUND;  Surgeon: Celena Sharper, MD;  Location: [facility] OR;  Service: Orthopedics;  Laterality: Left;   SECONDARY CLOSURE OF WOUND Left 07/07/2022   Procedure: SECONDARY CLOSURE OF WOUND;  Surgeon: Celena Sharper, MD;  Location: [facility] OR;  Service: Orthopedics;  Laterality: Left;   SHOULDER SURGERY Bilateral    WISDOM TOOTH EXTRACTION  05/23/2023   Social History   Occupational History   Not on file  Tobacco Use   Smoking status: Former    Types: Cigarettes    Passive exposure: Past   Smokeless tobacco: Former  Building Services Engineer status:  Never Used  Substance and Sexual Activity   Alcohol use: Yes    Comment: Not since 06/2022   Drug use: Never   Sexual activity: Not on file

## 2023-11-17 NOTE — Addendum Note (Signed)
 Addended byPrescott Parma on: 11/17/2023 03:21 PM   Modules accepted: Orders

## 2023-11-20 ENCOUNTER — Encounter: Payer: Self-pay | Admitting: Surgical

## 2023-11-24 ENCOUNTER — Ambulatory Visit
Admission: RE | Admit: 2023-11-24 | Discharge: 2023-11-24 | Disposition: A | Discharge status: Home / Self Care | Payer: BC Managed Care – PPO | Source: Ambulatory Visit | Attending: Surgical | Admitting: Surgical

## 2023-11-24 DIAGNOSIS — S42254D Nondisplaced fracture of greater tuberosity of right humerus, subsequent encounter for fracture with routine healing: Secondary | ICD-10-CM | POA: Diagnosis not present

## 2023-11-24 DIAGNOSIS — S42264D Nondisplaced fracture of lesser tuberosity of right humerus, subsequent encounter for fracture with routine healing: Secondary | ICD-10-CM | POA: Diagnosis not present

## 2023-11-24 DIAGNOSIS — S42291A Other displaced fracture of upper end of right humerus, initial encounter for closed fracture: Secondary | ICD-10-CM | POA: Diagnosis not present

## 2023-12-04 ENCOUNTER — Encounter: Payer: Self-pay | Admitting: Orthopedic Surgery

## 2023-12-04 ENCOUNTER — Ambulatory Visit: Payer: BC Managed Care – PPO | Admitting: Orthopedic Surgery

## 2023-12-04 DIAGNOSIS — S42291A Other displaced fracture of upper end of right humerus, initial encounter for closed fracture: Secondary | ICD-10-CM

## 2023-12-04 NOTE — Progress Notes (Signed)
Post-Op Visit Note   Patient: Ryan Lynch           Date of Birth: [birth year]           MRN: [id number] Visit Date: 12/04/2023 PCP: Patient, No Pcp Per   Assessment & Plan:  Chief Complaint:  Chief Complaint  Patient presents with   Right Shoulder - Fracture, Follow-up   Visit Diagnoses:  1. Other closed displaced fracture of proximal end of right humerus, initial encounter     Plan: Jerome is a 43 year old patient who is now about 2 months out right proximal humerus fracture.  CT scan done on 11/24/2023 which shows partial healing.  He is doing well clinically.  Has forward flexion and abduction both above 90 degrees.  Has not done any lifting.  No popping or crepitus with passive range of motion and torsion of the right arm.  Patient has been off his psoriatic arthritis medication and has been taking vitamin D for low vitamin D.  Would like for him to continue to move the arm is much as possible but no lifting.  No sling use recommended at this time.  4-week return with repeat radiographs and final release at that time.  Follow-Up Instructions: No follow-ups on file.   Orders:  No orders of the defined types were placed in this encounter.  No orders of the defined types were placed in this encounter.   Imaging: No results found.  PMFS History: Patient Active Problem List   Diagnosis Date Noted   Psoriatic arthritis ([name]) 03/20/2023   High risk medication use 03/20/2023   Vitamin D deficiency 03/20/2023   Generalized anxiety disorder    Closed nondisplaced fracture of left tibial plateau 07/16/2022   Multiple rib fractures 07/11/2022   Compression fracture of body of thoracic vertebra ([name]) 07/11/2022   Sepsis ([name]) 07/09/2022   Acute urinary retention 07/09/2022   Prolonged QT interval 07/09/2022   AKI (acute kidney injury) ([name]) 07/07/2022   Alcohol withdrawal ([name]) 07/05/2022   Acute anemia 07/05/2022   Psoriasis 07/05/2022   Ileus ([name]) 07/04/2022   Acute  metabolic encephalopathy 07/04/2022   Traumatic rhabdomyolysis ([name]) 07/03/2022   Observation after surgery 07/02/2022   Tibial plateau fracture, left 07/02/2022   Alcohol use 07/02/2022   Transaminitis 07/02/2022   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[name]) 07/02/2022   Adrenal nodule ([name]) 07/02/2022   Compression fracture of T7 vertebra ([name]) 07/02/2022   Thrombocytopenia ([name]) 07/02/2022   Anxiety 07/02/2022   Past Medical History:  Diagnosis Date   Acute anemia 06/2022   Acute metabolic encephalopathy 06/2022   Acute urinary retention 06/2022   ADD (attention deficit disorder)    Adrenal nodule ([name]) 06/2022   AKI (acute kidney injury) ([name]) 06/2022   Broken humerus    Closed nondisplaced fracture of left tibial plateau 07/2022   Multiple rib fractures 06/2022   Psoriasis    Psoriatic arthritis ([name])    Thrombocytopenia ([name]) 06/2022   Traumatic rhabdomyolysis ([name]) 06/2022    Family History  Problem Relation Age of Onset   Osteoporosis Mother    Arthritis Father     Past Surgical History:  Procedure Laterality Date   ANTERIOR CRUCIATE LIGAMENT REPAIR Right    ANTERIOR CRUCIATE LIGAMENT REPAIR Right    APPLICATION OF WOUND VAC Left 07/02/2022   Procedure: APPLICATION OF WOUND VAC;  Surgeon: Myrene Galas, MD;  Location: [facility] OR;  Service: Orthopedics;  Laterality: Left;   APPLICATION OF WOUND VAC Left 07/05/2022   Procedure:  APPLICATION OF WOUND VAC;  Surgeon: Myrene Galas, MD;  Location: [facility] OR;  Service: Orthopedics;  Laterality: Left;   CLOSED REDUCTION TIBIA Left 07/02/2022   Procedure: CLOSED REDUCTION TIBIA;  Surgeon: Myrene Galas, MD;  Location: [facility] OR;  Service: Orthopedics;  Laterality: Left;   EXTERNAL FIXATION LEG Left 07/02/2022   Procedure: EXTERNAL FIXATION LEG;  Surgeon: Myrene Galas, MD;  Location: [facility] OR;  Service: Orthopedics;  Laterality: Left;   FASCIOTOMY Left 07/02/2022   Procedure: FASCIOTOMY;  Surgeon: Myrene Galas, MD;  Location: [facility] OR;   Service: Orthopedics;  Laterality: Left;   I & D EXTREMITY Left 07/07/2022   Procedure: IRRIGATION AND DEBRIDEMENT EXTREMITY;  Surgeon: Myrene Galas, MD;  Location: [facility] OR;  Service: Orthopedics;  Laterality: Left;   ORIF TIBIA PLATEAU Left 07/05/2022   Procedure: OPEN REDUCTION INTERNAL FIXATION (ORIF) TIBIAL PLATEAU;  Surgeon: Myrene Galas, MD;  Location: [facility] OR;  Service: Orthopedics;  Laterality: Left;   SECONDARY CLOSURE OF WOUND Left 07/05/2022   Procedure: SECONDARY CLOSURE OF WOUND;  Surgeon: Myrene Galas, MD;  Location: [facility] OR;  Service: Orthopedics;  Laterality: Left;   SECONDARY CLOSURE OF WOUND Left 07/07/2022   Procedure: SECONDARY CLOSURE OF WOUND;  Surgeon: Myrene Galas, MD;  Location: [facility] OR;  Service: Orthopedics;  Laterality: Left;   SHOULDER SURGERY Bilateral    WISDOM TOOTH EXTRACTION  05/23/2023   Social History   Occupational History   Not on file  Tobacco Use   Smoking status: Former    Types: Cigarettes    Passive exposure: Past   Smokeless tobacco: Former  Building services engineer status: Never Used  Substance and Sexual Activity   Alcohol use: Yes    Comment: Not since 06/2022   Drug use: Never   Sexual activity: Not on file

## 2023-12-07 NOTE — Progress Notes (Deleted)
 Office Visit Note  Patient: Ryan Lynch             Date of Birth: 1981-08-23           MRN: [id number]             PCP: Patient, No Pcp Per Referring: No ref. provider found Visit Date: 12/19/2023   Subjective:  No chief complaint on file.   History of Present Illness: Ryan Lynch is a 43 y.o. male here for follow up for psoriatic arthritis and plaque psoriasis now on Cosentyx  300 mg subcu monthly.    Previous HPI 09/18/2023 Ryan Lynch is a 43 y.o. male here for follow up for psoriatic arthritis and plaque psoriasis now on Cosentyx  300 mg subcu monthly.  He feels the Cosentyx  is working well with complete clearance of his skin rashes joint pain or stiffness doing mostly better.  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  Currently in a sling anticipating he has an appointment with orthopedic surgery scheduled in 2 days.   Previous HPI 06/06/2023 Ryan Lynch is a 43 y.o. male here for follow up for psoriatic arthritis and plaque psoriasis now on Cosentyx  300 mg subcu monthly.  So far he has seen a large improvement in symptoms.  Skin rashes improved no longer with redness and almost all scaling is gone still has residual hyperpigmented patches in the previously inflamed areas.  Joint pain and stiffness is also improved.  Still using a left knee brace just as a precaution but not having too much pain except with very increased use.  He had a wisdom tooth extraction after this cracked and was temporarily on antibiotics.   Previous HPI 03/20/23 Ryan Lynch is a 43 y.o. 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  He has a longstanding history of psoriasis starting in his 44s.  Started out exclusively as skin plaques subsequently developed nail discoloration and pitting and then with joint pain in multiple areas.  He  has joint pain with occasional swelling in the fingers and toes on both sides sometimes hip and shoulder pain and not sure if these are swollen.  He was never on any systemic treatment for this.  He suffered a fall down a flight of steps last year with multiple injuries including T7 anterior vertebral body compression fracture, comminuted left tibial plateau fracture and scalp hematoma.  Subsequent had surgery for repair of the left knee fracture.  After all his events he experienced a severe worsening of his psoriasis throughout his body and increasing joint pain.  At first the left knee pain was most severe but later had the inflammatory joint pain in multiple areas including the right knee has actually become equal to the original injured site.  His increase symptoms are ongoing continuously since last September which has never happened before for this duration or intensity.  He was taking oxycodone  initially around type injury currently just using ibuprofen, Tylenol , and gabapentin . He has a previous history of alcohol abuse associated with anxiety disorder.  Had negative screening for hepatitis last year.  No history of frequent recurrent infections.   HCV neg 2023   No Rheumatology ROS completed.   PMFS History:  Patient Active Problem List   Diagnosis Date Noted   Psoriatic arthritis ([name]) 03/20/2023   High risk medication use 03/20/2023  Vitamin D  deficiency 03/20/2023   Generalized anxiety disorder    Closed nondisplaced fracture of left tibial plateau 07/16/2022   Multiple rib fractures 07/11/2022   Compression fracture of body of thoracic vertebra ([name]) 07/11/2022   Sepsis ([name]) 07/09/2022   Acute urinary retention 07/09/2022   Prolonged QT interval 07/09/2022   AKI (acute kidney injury) ([name]) 07/07/2022   Alcohol withdrawal ([name]) 07/05/2022   Acute anemia 07/05/2022   Psoriasis 07/05/2022   Ileus ([name]) 07/04/2022   Acute metabolic encephalopathy 07/04/2022   Traumatic  rhabdomyolysis ([name]) 07/03/2022   Observation after surgery 07/02/2022   Tibial plateau fracture, left 07/02/2022   Alcohol use 07/02/2022   Transaminitis 07/02/2022   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[name]) 07/02/2022   Adrenal nodule ([name]) 07/02/2022   Compression fracture of T7 vertebra ([name]) 07/02/2022   Thrombocytopenia ([name]) 07/02/2022   Anxiety 07/02/2022    Past Medical History:  Diagnosis Date   Acute anemia 06/2022   Acute metabolic encephalopathy 06/2022   Acute urinary retention 06/2022   ADD (attention deficit disorder)    Adrenal nodule ([name]) 06/2022   AKI (acute kidney injury) ([name]) 06/2022   Broken humerus    Closed nondisplaced fracture of left tibial plateau 07/2022   Multiple rib fractures 06/2022   Psoriasis    Psoriatic arthritis ([name])    Thrombocytopenia ([name]) 06/2022   Traumatic rhabdomyolysis ([name]) 06/2022    Family History  Problem Relation Age of Onset   Osteoporosis Mother    Arthritis Father    Past Surgical History:  Procedure Laterality Date   ANTERIOR CRUCIATE LIGAMENT REPAIR Right    ANTERIOR CRUCIATE LIGAMENT REPAIR Right    APPLICATION OF WOUND VAC Left 07/02/2022   Procedure: APPLICATION OF WOUND VAC;  Surgeon: Celena Sharper, MD;  Location: [facility] OR;  Service: Orthopedics;  Laterality: Left;   APPLICATION OF WOUND VAC Left 07/05/2022   Procedure: APPLICATION OF WOUND VAC;  Surgeon: Celena Sharper, MD;  Location: [facility] OR;  Service: Orthopedics;  Laterality: Left;   CLOSED REDUCTION TIBIA Left 07/02/2022   Procedure: CLOSED REDUCTION TIBIA;  Surgeon: Celena Sharper, MD;  Location: [facility] OR;  Service: Orthopedics;  Laterality: Left;   EXTERNAL FIXATION LEG Left 07/02/2022   Procedure: EXTERNAL FIXATION LEG;  Surgeon: Celena Sharper, MD;  Location: [facility] OR;  Service: Orthopedics;  Laterality: Left;   FASCIOTOMY Left 07/02/2022   Procedure: FASCIOTOMY;  Surgeon: Celena Sharper, MD;  Location: [facility] OR;  Service: Orthopedics;  Laterality: Left;   I & D  EXTREMITY Left 07/07/2022   Procedure: IRRIGATION AND DEBRIDEMENT EXTREMITY;  Surgeon: Celena Sharper, MD;  Location[facility] OR;  Service: Orthopedics;  Laterality: Left;   ORIF TIBIA PLATEAU Left 07/05/2022   Procedure: OPEN REDUCTION INTERNAL FIXATION (ORIF) TIBIAL PLATEAU;  Surgeon: Celena Sharper, MD;  Location: [facility] OR;  Service: Orthopedics;  Laterality: Left;   SECONDARY CLOSURE OF WOUND Left 07/05/2022   Procedure: SECONDARY CLOSURE OF WOUND;  Surgeon: Celena Sharper, MD;  Location: [facility] OR;  Service: Orthopedics;  Laterality: Left;   SECONDARY CLOSURE OF WOUND Left 07/07/2022   Procedure: SECONDARY CLOSURE OF WOUND;  Surgeon: Celena Sharper, MD;  Location: [facility] OR;  Service: Orthopedics;  Laterality: Left;   SHOULDER SURGERY Bilateral    WISDOM TOOTH EXTRACTION  05/23/2023   Social History   Social History Narrative   Not on file    There is no immunization history on file for this patient.   Objective: Vital Signs: There were no vitals taken for this visit.  Physical Exam   Musculoskeletal Exam: ***  CDAI Exam: CDAI Score: -- Patient Global: --; Provider Global: -- Swollen: --; Tender: -- Joint Exam 12/19/2023   No joint exam has been documented for this visit   There is currently no information documented on the homunculus. Go to the Rheumatology activity and complete the homunculus joint exam.  Investigation: No additional findings.  Imaging: CT SHOULDER RIGHT WO CONTRAST Result Date: 12/01/2023 CLINICAL DATA:  Right humeral fracture sustained on 09/08/2023 EXAM: CT OF THE UPPER RIGHT EXTREMITY WITHOUT CONTRAST TECHNIQUE: Multidetector CT imaging of the upper right extremity was performed according to the standard protocol. RADIATION DOSE REDUCTION: This exam was performed according to the departmental dose-optimization program which includes automated exposure control, adjustment of the mA and/or kV according to patient size and/or use of iterative reconstruction  technique. COMPARISON:  CT 09/21/2019 FINDINGS: Bones/Joint/Cartilage Comminuted fracture of the right humeral head and neck with interval healing changes. There is bridging callus formation most pronounced along the lateral fracture margin at the level of the humeral neck. Osseous union is also seen across the medullary space of the humeral neck with some residual fracture lucency present. Overall, no change in fracture alignment. No new fractures. No dislocation. Moderate osteoarthritic changes of the glenohumeral joint, which have progressed. No suspicious bone lesion. Ligaments Suboptimally assessed by CT. Muscles and Tendons Grossly intact rotator cuff.  No muscle atrophy. Soft tissues No soft tissue swelling or fluid collection. No right axillary lymphadenopathy. Included right lung field is clear. IMPRESSION: 1. Comminuted fracture of the right humeral head and neck with partial osseous union. No change in fracture alignment. 2. Moderate osteoarthritic changes of the glenohumeral joint, which have progressed. Electronically Signed   By: Mabel Converse D.O.   On: 12/01/2023 20:51   XR Shoulder Right Result Date: 11/17/2023 AP, Raynald Cinthia GRADE, axillary views of right shoulder reviewed.  Proximal humerus fracture again noted with no significant callus formation.  No interval fracture or dislocation.  No change in fracture alignment.   Recent Labs: Lab Results  Component Value Date   WBC 9.7 09/08/2023   HGB 15.0 09/08/2023   PLT 193 09/08/2023   NA 140 09/08/2023   K 3.9 09/08/2023   CL 103 09/08/2023   CO2 22 09/08/2023   GLUCOSE 124 (H) 09/08/2023   BUN 11 09/08/2023   CREATININE 0.91 09/08/2023   BILITOT 0.4 06/06/2023   ALKPHOS 407 (H) 07/18/2022   AST 19 06/06/2023   ALT 17 06/06/2023   PROT 7.2 06/06/2023   ALBUMIN  2.7 (L) 07/18/2022   CALCIUM 9.0 09/08/2023   QFTBGOLDPLUS NEGATIVE 03/20/2023    Speciality Comments: No specialty comments available.  Procedures:  No  procedures performed Allergies: Timothy grass pollen allergen   Assessment / Plan:     Visit Diagnoses: No diagnosis found.  ***  Orders: No orders of the defined types were placed in this encounter.  No orders of the defined types were placed in this encounter.    Follow-Up Instructions: No follow-ups on file.   Shelba SHAUNNA Potters, RT  Note - This record has been created using [employer].  Chart creation errors have been sought, but may not always  have been located. Such creation errors do not reflect on  the standard of medical care.

## 2023-12-16 ENCOUNTER — Other Ambulatory Visit: Payer: Self-pay

## 2023-12-16 ENCOUNTER — Encounter (HOSPITAL_BASED_OUTPATIENT_CLINIC_OR_DEPARTMENT_OTHER): Payer: Self-pay

## 2023-12-16 ENCOUNTER — Emergency Department (HOSPITAL_BASED_OUTPATIENT_CLINIC_OR_DEPARTMENT_OTHER)
Admission: EM | Admit: 2023-12-16 | Discharge: 2023-12-16 | Disposition: A | Discharge status: Home / Self Care | Payer: BC Managed Care – PPO | Attending: Emergency Medicine | Admitting: Emergency Medicine

## 2023-12-16 ENCOUNTER — Emergency Department (HOSPITAL_BASED_OUTPATIENT_CLINIC_OR_DEPARTMENT_OTHER): Payer: BC Managed Care – PPO

## 2023-12-16 DIAGNOSIS — S0990XA Unspecified injury of head, initial encounter: Secondary | ICD-10-CM | POA: Diagnosis not present

## 2023-12-16 DIAGNOSIS — R519 Headache, unspecified: Secondary | ICD-10-CM | POA: Diagnosis not present

## 2023-12-16 DIAGNOSIS — Z20822 Contact with and (suspected) exposure to covid-19: Secondary | ICD-10-CM | POA: Diagnosis not present

## 2023-12-16 DIAGNOSIS — R000 Tachycardia, unspecified: Secondary | ICD-10-CM | POA: Diagnosis not present

## 2023-12-16 DIAGNOSIS — J09X2 Influenza due to identified novel influenza A virus with other respiratory manifestations: Secondary | ICD-10-CM | POA: Insufficient documentation

## 2023-12-16 DIAGNOSIS — J101 Influenza due to other identified influenza virus with other respiratory manifestations: Secondary | ICD-10-CM

## 2023-12-16 LAB — BASIC METABOLIC PANEL
Anion gap: 20 — ABNORMAL HIGH (ref 5–15)
BUN: 20 mg/dL (ref 6–20)
CO2: 17 mmol/L — ABNORMAL LOW (ref 22–32)
Calcium: 9.2 mg/dL (ref 8.9–10.3)
Chloride: 98 mmol/L (ref 98–111)
Creatinine, Ser: 0.9 mg/dL (ref 0.61–1.24)
GFR, Estimated: >60 mL/min (ref >60)
Glucose, Bld: 157 mg/dL — ABNORMAL HIGH (ref 70–99)
Potassium: 3.6 mmol/L (ref 3.5–5.1)
Sodium: 135 mmol/L (ref 135–145)

## 2023-12-16 LAB — CBC WITH DIFFERENTIAL/PLATELET
Abs Immature Granulocytes: 0.03 10*3/uL (ref 0.00–0.07)
Basophils Absolute: 0 10*3/uL (ref 0.0–0.1)
Basophils Relative: 0 %
Eosinophils Absolute: 0.1 10*3/uL (ref 0.0–0.5)
Eosinophils Relative: 1 %
HCT: 40.2 % (ref 39.0–52.0)
Hemoglobin: 14.4 g/dL (ref 13.0–17.0)
Immature Granulocytes: 0 %
Lymphocytes Relative: 12 %
Lymphs Abs: 1 10*3/uL (ref 0.7–4.0)
MCH: 29.9 pg (ref 26.0–34.0)
MCHC: 35.8 g/dL (ref 30.0–36.0)
MCV: 83.6 fL (ref 80.0–100.0)
Monocytes Absolute: 0.6 10*3/uL (ref 0.1–1.0)
Monocytes Relative: 7 %
Neutro Abs: 6.9 10*3/uL (ref 1.7–7.7)
Neutrophils Relative %: 80 %
Platelets: 115 10*3/uL — ABNORMAL LOW (ref 150–400)
RBC: 4.81 MIL/uL (ref 4.22–5.81)
RDW: 13.6 % (ref 11.5–15.5)
WBC: 8.6 10*3/uL (ref 4.0–10.5)
nRBC: 0 % (ref 0.0–0.2)

## 2023-12-16 LAB — RESP PANEL BY RT-PCR (RSV, FLU A&B, COVID)  RVPGX2
Influenza A by PCR: POSITIVE — AB
Influenza B by PCR: NEGATIVE
Resp Syncytial Virus by PCR: NEGATIVE
SARS Coronavirus 2 by RT PCR: NEGATIVE

## 2023-12-16 LAB — CBG MONITORING, ED: Glucose-Capillary: 162 mg/dL — ABNORMAL HIGH (ref 70–99)

## 2023-12-16 MED ORDER — DROPERIDOL 2.5 MG/ML IJ SOLN
1.2500 mg | Freq: Once | INTRAMUSCULAR | Status: AC
  Administered 2023-12-16: 1.25 mg via INTRAVENOUS
  Filled 2023-12-16: qty 2

## 2023-12-16 MED ORDER — ONDANSETRON 8 MG PO TBDP: ORAL_TABLET | ORAL | 0 refills | Status: DC

## 2023-12-16 MED ORDER — SODIUM CHLORIDE 0.9 % IV BOLUS
500.0000 mL | Freq: Once | INTRAVENOUS | Status: AC
  Administered 2023-12-16: 500 mL via INTRAVENOUS

## 2023-12-16 MED ORDER — KETOROLAC TROMETHAMINE 30 MG/ML IJ SOLN
30.0000 mg | Freq: Once | INTRAMUSCULAR | Status: DC
  Filled 2023-12-16: qty 1

## 2023-12-16 MED ORDER — MAGNESIUM SULFATE 2 GM/50ML IV SOLN
2.0000 g | Freq: Once | INTRAVENOUS | Status: AC
  Administered 2023-12-16: 2 g via INTRAVENOUS
  Filled 2023-12-16: qty 50

## 2023-12-16 MED ORDER — SODIUM CHLORIDE 0.9 % IV BOLUS
1000.0000 mL | Freq: Once | INTRAVENOUS | Status: AC
Start: 2023-12-16 — End: 2023-12-16
  Administered 2023-12-16: 1000 mL via INTRAVENOUS

## 2023-12-16 NOTE — ED Triage Notes (Signed)
Pt POV with parents d/t Headache, nausea, pain in face onset 2 days but worse today.

## 2023-12-16 NOTE — ED Notes (Signed)
Patient to Ct via stretcher.

## 2023-12-16 NOTE — ED Notes (Signed)
 Dc instructions reviewed with patient. Patient voiced understanding. Dc with belongings.

## 2023-12-16 NOTE — ED Provider Notes (Signed)
Patient signed out to me by previous provider. Please refer to their note for full HPI.  Briefly this is a 43 year old male who presented with headache.  Was found to be influenza A positive.  Was tachycardic but fluid responsive.  Blood work reassuring.  CTA of head unremarkable.  Report at signout is for additional IV fluids, if heart rate improving and patient can p.o. plan for outpatient follow-up.  On reevaluation heart rate has decreased down into the 120s.  Patient is sitting up on the edge of the bed.  He is requesting to be discharged, parents at bedside are in agreement with this, no acute concerns brought to my attention at that time.  Patient appears well, pleasant, is ambulating independently.  Patient at this time appears safe and stable for discharge and close outpatient follow up. Discharge plan and strict return to ED precautions discussed, patient verbalizes understanding and agreement.   Rozelle Logan, [place] 12/16/23 0981

## 2023-12-16 NOTE — ED Notes (Signed)
Pt refused vitals.walking out of the unit

## 2023-12-16 NOTE — Discharge Instructions (Signed)
You have been seen and discharged from the emergency department.  You were found to be influenza A positive.  You were given medications for symptom control.  Continue to take nausea medicine as needed.  Follow-up with your primary provider for further evaluation and further care. Take home medications as prescribed. If you have any worsening symptoms or further concerns for your health please return to an emergency department for further evaluation.

## 2023-12-16 NOTE — ED Provider Notes (Addendum)
[facility] AT [facility] Provider Note   CSN: [id number] Arrival date & time: 12/16/23  0559     History  Chief Complaint  Patient presents with   Headache    Ryan Lynch is a 43 y.o. male.  The history is provided by the patient and a parent.  Headache Pain location:  L parietal Quality:  Dull Radiates to:  Does not radiate Onset quality:  Gradual Duration:  2 days Timing:  Constant Chronicity:  New Context: not coughing and not straining   Relieved by:  Nothing Worsened by:  Nothing Ineffective treatments:  None tried Associated symptoms: congestion, cough, facial pain, myalgias, nausea and sore throat   Associated symptoms: no blurred vision, no diarrhea, no dizziness, no ear pain, no fever, no focal weakness, no hearing loss, no neck pain, no neck stiffness, no numbness, no paresthesias, no visual change, no vomiting and no weakness   Risk factors: no anger   Patient with anxiety and depression, and thrombocytopenia  with 2 days of headache facial pain and now nausea.  Father was just diagnosed with influenza.  Thinks the nausea is from a bad TV dinner.  Also congestion, cough and severe sore throat.  No diarrhea.       Home Medications Prior to Admission medications   Medication Sig Start Date End Date Taking? Authorizing Provider  gabapentin (NEURONTIN) 600 MG tablet Take 600 mg by mouth 3 (three) times daily. 03/16/23   [provider]  ibuprofen (ADVIL) 200 MG tablet Take 200 mg by mouth every 6 (six) hours as needed.    [provider]  oxyCODONE (ROXICODONE) 5 MG immediate release tablet Take 1 tablet (5 mg total) by mouth every 6 (six) hours as needed. 09/08/23   Elayne Snare K, DO  Secukinumab (COSENTYX UNOREADY) 300 MG/2ML SOAJ Inject 300 mg into the skin every 28 (twenty-eight) days. On 04/09/23 then every 4 weeks thereafter 09/18/23   Fuller Plan, MD  sertraline (ZOLOFT) 100 MG tablet Take 1 tablet  (100 mg total) by mouth daily. 07/22/22   Angiulli, Mcarthur Rossetti, PA-C      Allergies    Timothy grass pollen allergen    Review of Systems   Review of Systems  Constitutional:  Negative for fever.  HENT:  Positive for congestion and sore throat. Negative for ear pain and hearing loss.   Eyes:  Negative for blurred vision.  Respiratory:  Positive for cough.   Cardiovascular:  Negative for chest pain and leg swelling.  Gastrointestinal:  Positive for nausea. Negative for diarrhea and vomiting.  Musculoskeletal:  Positive for myalgias. Negative for neck pain and neck stiffness.  Neurological:  Positive for headaches. Negative for dizziness, tremors, focal weakness, facial asymmetry, speech difficulty, weakness, numbness and paresthesias.  All other systems reviewed and are negative.   Physical Exam Updated Vital Signs BP (!) 141/75   Pulse (!) 124   Temp 98.3 F (36.8 C) (Oral)   Resp 18   Ht 5\' 10"  (1.778 m)   Wt 83.9 kg   SpO2 94%   BMI 26.54 kg/m  Physical Exam Vitals and nursing note reviewed. Exam conducted with a chaperone present.  Constitutional:      General: He is not in acute distress.    Appearance: Normal appearance. He is well-developed. He is not ill-appearing or diaphoretic.  HENT:     Head: Normocephalic and atraumatic.     Nose: Nose normal.  Eyes:     Conjunctiva/sclera:  Conjunctivae normal.     Pupils: Pupils are equal, round, and reactive to light.  Cardiovascular:     Rate and Rhythm: Regular rhythm. Tachycardia present.     Pulses: Normal pulses.     Heart sounds: Normal heart sounds.  Pulmonary:     Effort: Pulmonary effort is normal.     Breath sounds: Normal breath sounds. No wheezing or rales.  Abdominal:     General: Bowel sounds are normal.     Palpations: Abdomen is soft.     Tenderness: There is no abdominal tenderness. There is no guarding or rebound.  Musculoskeletal:        General: Normal range of motion.     Cervical back: Normal  range of motion and neck supple. No rigidity.  Lymphadenopathy:     Cervical: No cervical adenopathy.  Skin:    General: Skin is warm and dry.     Capillary Refill: Capillary refill takes less than 2 seconds.  Neurological:     General: No focal deficit present.     Mental Status: He is alert and oriented to person, place, and time.     Deep Tendon Reflexes: Reflexes normal.  Psychiatric:        Thought Content: Thought content normal.     ED Results / Procedures / Treatments   Labs (all labs ordered are listed, but only abnormal results are displayed) Results for orders placed or performed during the hospital encounter of 12/16/23  CBG monitoring, ED   Collection Time: 12/16/23  6:06 AM  Result Value Ref Range   Glucose-Capillary 162 (H) 70 - 99 mg/dL  CBC with Differential   Collection Time: 12/16/23  6:27 AM  Result Value Ref Range   WBC 8.6 4.0 - 10.5 K/uL   RBC 4.81 4.22 - 5.81 MIL/uL   Hemoglobin 14.4 13.0 - 17.0 g/dL   HCT 16.1 09.6 - 04.5 %   MCV 83.6 80.0 - 100.0 fL   MCH 29.9 26.0 - 34.0 pg   MCHC 35.8 30.0 - 36.0 g/dL   RDW 40.9 81.1 - 91.4 %   Platelets 115 (L) 150 - 400 K/uL   nRBC 0.0 0.0 - 0.2 %   Neutrophils Relative % 80 %   Neutro Abs 6.9 1.7 - 7.7 K/uL   Lymphocytes Relative 12 %   Lymphs Abs 1.0 0.7 - 4.0 K/uL   Monocytes Relative 7 %   Monocytes Absolute 0.6 0.1 - 1.0 K/uL   Eosinophils Relative 1 %   Eosinophils Absolute 0.1 0.0 - 0.5 K/uL   Basophils Relative 0 %   Basophils Absolute 0.0 0.0 - 0.1 K/uL   Immature Granulocytes 0 %   Abs Immature Granulocytes 0.03 0.00 - 0.07 K/uL   CT SHOULDER RIGHT WO CONTRAST Result Date: 12/01/2023 CLINICAL DATA:  Right humeral fracture sustained on 09/08/2023 EXAM: CT OF THE UPPER RIGHT EXTREMITY WITHOUT CONTRAST TECHNIQUE: Multidetector CT imaging of the upper right extremity was performed according to the standard protocol. RADIATION DOSE REDUCTION: This exam was performed according to the departmental  dose-optimization program which includes automated exposure control, adjustment of the mA and/or kV according to patient size and/or use of iterative reconstruction technique. COMPARISON:  CT 09/21/2019 FINDINGS: Bones/Joint/Cartilage Comminuted fracture of the right humeral head and neck with interval healing changes. There is bridging callus formation most pronounced along the lateral fracture margin at the level of the humeral neck. Osseous union is also seen across the medullary space of the humeral neck with  some residual fracture lucency present. Overall, no change in fracture alignment. No new fractures. No dislocation. Moderate osteoarthritic changes of the glenohumeral joint, which have progressed. No suspicious bone lesion. Ligaments Suboptimally assessed by CT. Muscles and Tendons Grossly intact rotator cuff.  No muscle atrophy. Soft tissues No soft tissue swelling or fluid collection. No right axillary lymphadenopathy. Included right lung field is clear. IMPRESSION: 1. Comminuted fracture of the right humeral head and neck with partial osseous union. No change in fracture alignment. 2. Moderate osteoarthritic changes of the glenohumeral joint, which have progressed. Electronically Signed   By: Duanne Guess D.O.   On: 12/01/2023 20:51   XR Shoulder Right Result Date: 11/17/2023 AP, Bartholome Bill, axillary views of right shoulder reviewed.  Proximal humerus fracture again noted with no significant callus formation.  No interval fracture or dislocation.  No change in fracture alignment.    EKG  EKG Interpretation Date/Time:  Saturday December 16 2023 06:19:12 EST Ventricular Rate:  127 PR Interval:  125 QRS Duration:  95 QT Interval:  334 QTC Calculation: 486 R Axis:   95  Text Interpretation: Sinus tachycardia Confirmed by Yobani Schertzer ([id number]) on 12/16/2023 6:36:37 AM        Radiology No results found.  Procedures Procedures    Medications Ordered in ED Medications   sodium chloride 0.9 % bolus 500 mL (has no administration in time range)  magnesium sulfate IVPB 2 g 50 mL (has no administration in time range)  droperidol (INAPSINE) 2.5 MG/ML injection 1.25 mg (has no administration in time range)    ED Course/ Medical Decision Making/ A&P                                 Medical Decision Making Patient with headache and facial pain sore throat, congestion and now nausea. Father has the flu   Amount and/or Complexity of Data Reviewed Independent Historian: parent    Details: See above  External Data Reviewed: notes.    Details: Previous notes reviewed  Labs: ordered.    Details: Glucose slight elevation 162. White count 8.6, normal hemoglobin 14.4, low platelets 115, influenza A positive  Radiology: ordered and independent interpretation performed.    Details: CT negative  ECG/medicine tests: ordered and independent interpretation performed. Decision-making details documented in ED Course.  Risk Prescription drug management. Risk Details: Well appearing.  I do not believe the patient is septic.     Final Clinical Impression(s) / ED Diagnoses Final diagnoses:  None   Signed out to Dr. Wilkie Aye pending medications reassessed  Rx / DC Orders ED Discharge Orders     None             Tu Bayle, MD 12/16/23 [phone]

## 2023-12-16 NOTE — ED Notes (Signed)
Pt screaming at his parents, entered room ,pt sitting at edge of the stretcher saying he wants to go home.very weak, can't scoot himself up in the bed. Pt's family having  hard time keeping him in bed due to agitation

## 2023-12-19 ENCOUNTER — Ambulatory Visit: Payer: BC Managed Care – PPO | Admitting: Internal Medicine

## 2023-12-19 DIAGNOSIS — L409 Psoriasis, unspecified: Secondary | ICD-10-CM

## 2023-12-19 DIAGNOSIS — L4050 Arthropathic psoriasis, unspecified: Secondary | ICD-10-CM

## 2023-12-19 DIAGNOSIS — Z79899 Other long term (current) drug therapy: Secondary | ICD-10-CM

## 2023-12-22 ENCOUNTER — Other Ambulatory Visit: Payer: Self-pay

## 2024-01-01 ENCOUNTER — Encounter: Payer: Self-pay | Admitting: Orthopedic Surgery

## 2024-01-01 ENCOUNTER — Other Ambulatory Visit (INDEPENDENT_AMBULATORY_CARE_PROVIDER_SITE_OTHER): Payer: Self-pay

## 2024-01-01 ENCOUNTER — Ambulatory Visit: Payer: BC Managed Care – PPO | Admitting: Orthopedic Surgery

## 2024-01-01 DIAGNOSIS — S42291A Other displaced fracture of upper end of right humerus, initial encounter for closed fracture: Secondary | ICD-10-CM

## 2024-01-01 NOTE — Progress Notes (Signed)
Post-Op Visit Note   Patient: Ryan Lynch           Date of Birth: 02/03/1981           MRN: [id number] Visit Date: 01/01/2024 PCP: Patient, No Pcp Per   Assessment & Plan:  Chief Complaint:  Chief Complaint  Patient presents with   Right Shoulder - Follow-up, Fracture    DOI: 09/08/23   Visit Diagnoses:  1. Other closed displaced fracture of proximal end of right humerus, initial encounter     Plan: Alecxander is now 3 and half months out right proximal humerus fracture.  Overall he is doing well.  No popping grinding and not having any pain.  He has been taking vitamin D.  On exam he has range of motion of 35/85/150.  Still little weak to internal/external rotation.  Functionally he can get overhead.  Would like him to continue stretching exercises with no lifting more than 15 pounds for the next 3 months.  Continue with vitamin D during that time as well.  We will see him back as needed if he has any problems but he looks good at this point and should continue to progress with range of motion.  Follow-Up Instructions: No follow-ups on file.   Orders:  Orders Placed This Encounter  Procedures   XR Shoulder Right   No orders of the defined types were placed in this encounter.   Imaging: No results found.  PMFS History: Patient Active Problem List   Diagnosis Date Noted   Psoriatic arthritis ([name]) 03/20/2023   High risk medication use 03/20/2023   Vitamin D deficiency 03/20/2023   Generalized anxiety disorder    Closed nondisplaced fracture of left tibial plateau 07/16/2022   Multiple rib fractures 07/11/2022   Compression fracture of body of thoracic vertebra ([name]) 07/11/2022   Sepsis ([name]) 07/09/2022   Acute urinary retention 07/09/2022   Prolonged QT interval 07/09/2022   AKI (acute kidney injury) ([name]) 07/07/2022   Alcohol withdrawal ([name]) 07/05/2022   Acute anemia 07/05/2022   Psoriasis 07/05/2022   Ileus ([name]) 07/04/2022   Acute metabolic encephalopathy  07/04/2022   Traumatic rhabdomyolysis ([name]) 07/03/2022   Observation after surgery 07/02/2022   Tibial plateau fracture, left 07/02/2022   Alcohol use 07/02/2022   Transaminitis 07/02/2022   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[name]) 07/02/2022   Adrenal nodule ([name]) 07/02/2022   Compression fracture of T7 vertebra ([name]) 07/02/2022   Thrombocytopenia ([name]) 07/02/2022   Anxiety 07/02/2022   Past Medical History:  Diagnosis Date   Acute anemia 06/2022   Acute metabolic encephalopathy 06/2022   Acute urinary retention 06/2022   ADD (attention deficit disorder)    Adrenal nodule ([name]) 06/2022   AKI (acute kidney injury) ([name]) 06/2022   Broken humerus    Closed nondisplaced fracture of left tibial plateau 07/2022   Multiple rib fractures 06/2022   Psoriasis    Psoriatic arthritis ([name])    Thrombocytopenia ([name]) 06/2022   Traumatic rhabdomyolysis ([name]) 06/2022    Family History  Problem Relation Age of Onset   Osteoporosis Mother    Arthritis Father     Past Surgical History:  Procedure Laterality Date   ANTERIOR CRUCIATE LIGAMENT REPAIR Right    ANTERIOR CRUCIATE LIGAMENT REPAIR Right    APPLICATION OF WOUND VAC Left 07/02/2022   Procedure: APPLICATION OF WOUND VAC;  Surgeon: Myrene Galas, MD;  Location: [facility] OR;  Service: Orthopedics;  Laterality: Left;   APPLICATION OF WOUND VAC Left 07/05/2022  Procedure: APPLICATION OF WOUND VAC;  Surgeon: Myrene Galas, MD;  Location: [facility] OR;  Service: Orthopedics;  Laterality: Left;   CLOSED REDUCTION TIBIA Left 07/02/2022   Procedure: CLOSED REDUCTION TIBIA;  Surgeon: Myrene Galas, MD;  Location: [facility] OR;  Service: Orthopedics;  Laterality: Left;   EXTERNAL FIXATION LEG Left 07/02/2022   Procedure: EXTERNAL FIXATION LEG;  Surgeon: Myrene Galas, MD;  Location[facility] OR;  Service: Orthopedics;  Laterality: Left;   FASCIOTOMY Left 07/02/2022   Procedure: FASCIOTOMY;  Surgeon: Myrene Galas, MD;  Location: [facility] OR;  Service: Orthopedics;   Laterality: Left;   I & D EXTREMITY Left 07/07/2022   Procedure: IRRIGATION AND DEBRIDEMENT EXTREMITY;  Surgeon: Myrene Galas, MD;  Location: [facility] OR;  Service: Orthopedics;  Laterality: Left;   ORIF TIBIA PLATEAU Left 07/05/2022   Procedure: OPEN REDUCTION INTERNAL FIXATION (ORIF) TIBIAL PLATEAU;  Surgeon: Myrene Galas, MD;  Location: [facility] OR;  Service: Orthopedics;  Laterality: Left;   SECONDARY CLOSURE OF WOUND Left 07/05/2022   Procedure: SECONDARY CLOSURE OF WOUND;  Surgeon: Myrene Galas, MD;  Location: [facility] OR;  Service: Orthopedics;  Laterality: Left;   SECONDARY CLOSURE OF WOUND Left 07/07/2022   Procedure: SECONDARY CLOSURE OF WOUND;  Surgeon: Myrene Galas, MD;  Location: [facility] OR;  Service: Orthopedics;  Laterality: Left;   SHOULDER SURGERY Bilateral    WISDOM TOOTH EXTRACTION  05/23/2023   Social History   Occupational History   Not on file  Tobacco Use   Smoking status: Former    Types: Cigarettes    Passive exposure: Past   Smokeless tobacco: Former  Building services engineer status: Never Used  Substance and Sexual Activity   Alcohol use: Yes    Comment: Not since 06/2022   Drug use: Never   Sexual activity: Not on file

## 2024-02-01 ENCOUNTER — Other Ambulatory Visit: Payer: Self-pay

## 2024-02-01 ENCOUNTER — Other Ambulatory Visit: Payer: Self-pay | Admitting: Pharmacy Technician

## 2024-02-01 NOTE — Progress Notes (Signed)
 Office Visit Note  Patient: Ryan Lynch             Date of Birth: 1981-07-21           MRN: [id number]             PCP: Patient, No Pcp Per Referring: No ref. provider found Visit Date: 02/13/2024   Subjective:  Follow-up (Patient states he broke his right arm in November and has been off the Cosentyx to allow for healing. Patient states he plans to start back with the Cosentyx soon. )   History of Present Illness: Ryan Lynch is a 43 y.o. male here for follow up for psoriatic arthritis and plaque psoriasis now on Cosentyx 300 mg subcu monthly.  He has been off treatment since in January due to concern this was delaying arm healing after humeral fracture from November. Subsequently progress has gone well, still with some limitations in in mobility but not much pain. He was also sick with influenza A in February without major complications. He has not experienced any severe flare of symptoms with pausing medication 2 months.  Previous HPI 09/18/2023 Ryan Lynch is a 43 y.o. male here for follow up for psoriatic arthritis and plaque psoriasis now on Cosentyx 300 mg subcu monthly.  He feels the Cosentyx is working well with complete clearance of his skin rashes joint pain or stiffness doing mostly better.  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  Currently in a sling anticipating he has an appointment with orthopedic surgery scheduled in 2 days.   Previous HPI 06/06/2023 Ryan Lynch is a 43 y.o. male here for follow up for psoriatic arthritis and plaque psoriasis now on Cosentyx 300 mg subcu monthly.  So far he has seen a large improvement in symptoms.  Skin rashes improved no longer with redness and almost all scaling is gone still has residual hyperpigmented patches in the previously inflamed areas.  Joint pain and stiffness is also improved.  Still using a left knee brace just as a precaution but not  having too much pain except with very increased use.  He had a wisdom tooth extraction after this cracked and was temporarily on antibiotics.   Previous HPI 03/20/23 Ryan Lynch is a 43 y.o. 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  He has a longstanding history of psoriasis starting in his 22s.  Started out exclusively as skin plaques subsequently developed nail discoloration and pitting and then with joint pain in multiple areas.  He has joint pain with occasional swelling in the fingers and toes on both sides sometimes hip and shoulder pain and not sure if these are swollen.  He was never on any systemic treatment for this.  He suffered a fall down a flight of steps last year with multiple injuries including T7 anterior vertebral body compression fracture, comminuted left tibial plateau fracture and scalp hematoma.  Subsequent had surgery for repair of the left knee fracture.  After all his events he experienced a severe worsening of his psoriasis throughout his body and increasing joint pain.  At first the left knee pain was most severe but later had the inflammatory joint pain in multiple areas including the right knee has actually become equal to the original injured site.  His increase symptoms are ongoing continuously since last September which has never  happened before for this duration or intensity.  He was taking oxycodone initially around type injury currently just using ibuprofen, Tylenol, and gabapentin. He has a previous history of alcohol abuse associated with anxiety disorder.  Had negative screening for hepatitis last year.  No history of frequent recurrent infections.   HCV neg 2023   Review of Systems  Constitutional:  Negative for fatigue.  HENT:  Negative for mouth sores and mouth dryness.   Eyes:  Negative for dryness.  Respiratory:  Negative for shortness of breath.   Cardiovascular:   Negative for chest pain and palpitations.  Gastrointestinal:  Negative for blood in stool, constipation and diarrhea.  Endocrine: Negative for increased urination.  Genitourinary:  Negative for involuntary urination.  Musculoskeletal:  Positive for joint pain, joint pain, myalgias, morning stiffness and myalgias. Negative for gait problem, joint swelling, muscle weakness and muscle tenderness.  Skin:  Negative for color change, rash, hair loss and sensitivity to sunlight.  Allergic/Immunologic: Negative for susceptible to infections.  Neurological:  Negative for dizziness and headaches.  Hematological:  Negative for swollen glands.  Psychiatric/Behavioral:  Negative for depressed mood and sleep disturbance. The patient is not nervous/anxious.     PMFS History:  Patient Active Problem List   Diagnosis Date Noted   Psoriatic arthritis ([name]) 03/20/2023   High risk medication use 03/20/2023   Vitamin D deficiency 03/20/2023   Generalized anxiety disorder    Closed nondisplaced fracture of left tibial plateau 07/16/2022   Multiple rib fractures 07/11/2022   Compression fracture of body of thoracic vertebra ([name]) 07/11/2022   Sepsis ([name]) 07/09/2022   Acute urinary retention 07/09/2022   Prolonged QT interval 07/09/2022   AKI (acute kidney injury) ([name]) 07/07/2022   Alcohol withdrawal ([name]) 07/05/2022   Acute anemia 07/05/2022   Psoriasis 07/05/2022   Ileus ([name]) 07/04/2022   Acute metabolic encephalopathy 07/04/2022   Traumatic rhabdomyolysis ([name]) 07/03/2022   Observation after surgery 07/02/2022   Tibial plateau fracture, left 07/02/2022   Alcohol use 07/02/2022   Transaminitis 07/02/2022   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[name]) 07/02/2022   Adrenal nodule ([name]) 07/02/2022   Compression fracture of T7 vertebra ([name]) 07/02/2022   Thrombocytopenia ([name]) 07/02/2022   Anxiety 07/02/2022    Past Medical History:  Diagnosis Date   Acute anemia 06/2022   Acute metabolic  encephalopathy 06/2022   Acute urinary retention 06/2022   ADD (attention deficit disorder)    Adrenal nodule ([name]) 06/2022   AKI (acute kidney injury) ([name]) 06/2022   Broken arm 09/2023   Broken humerus    Closed nondisplaced fracture of left tibial plateau 07/2022   Multiple rib fractures 06/2022   Psoriasis    Psoriatic arthritis ([name])    Thrombocytopenia ([name]) 06/2022   Traumatic rhabdomyolysis ([name]) 06/2022    Family History  Problem Relation Age of Onset   Osteoporosis Mother    Arthritis Father    Past Surgical History:  Procedure Laterality Date   ANTERIOR CRUCIATE LIGAMENT REPAIR Right    ANTERIOR CRUCIATE LIGAMENT REPAIR Right    APPLICATION OF WOUND VAC Left 07/02/2022   Procedure: APPLICATION OF WOUND VAC;  Surgeon: Myrene Galas, MD;  Location: [facility] OR;  Service: Orthopedics;  Laterality: Left;   APPLICATION OF WOUND VAC Left 07/05/2022   Procedure: APPLICATION OF WOUND VAC;  Surgeon: Myrene Galas, MD;  Location: [facility] OR;  Service: Orthopedics;  Laterality: Left;   CLOSED REDUCTION TIBIA Left 07/02/2022   Procedure: CLOSED REDUCTION TIBIA;  Surgeon: Myrene Galas, MD;  Location: [facility] OR;  Service: Orthopedics;  Laterality: Left;   EXTERNAL FIXATION LEG Left 07/02/2022   Procedure: EXTERNAL FIXATION LEG;  Surgeon: Myrene Galas, MD;  Location: [facility] OR;  Service: Orthopedics;  Laterality: Left;   FASCIOTOMY Left 07/02/2022   Procedure: FASCIOTOMY;  Surgeon: Myrene Galas, MD;  Location: [facility] OR;  Service: Orthopedics;  Laterality: Left;   I & D EXTREMITY Left 07/07/2022   Procedure: IRRIGATION AND DEBRIDEMENT EXTREMITY;  Surgeon: Myrene Galas, MD;  Location: [facility] OR;  Service: Orthopedics;  Laterality: Left;   ORIF TIBIA PLATEAU Left 07/05/2022   Procedure: OPEN REDUCTION INTERNAL FIXATION (ORIF) TIBIAL PLATEAU;  Surgeon: Myrene Galas, MD;  Location: [facility] OR;  Service: Orthopedics;  Laterality: Left;   SECONDARY CLOSURE OF WOUND Left 07/05/2022   Procedure: SECONDARY  CLOSURE OF WOUND;  Surgeon: Myrene Galas, MD;  Location: [facility] OR;  Service: Orthopedics;  Laterality: Left;   SECONDARY CLOSURE OF WOUND Left 07/07/2022   Procedure: SECONDARY CLOSURE OF WOUND;  Surgeon: Myrene Galas, MD;  Location: [facility] OR;  Service: Orthopedics;  Laterality: Left;   SHOULDER SURGERY Bilateral    WISDOM TOOTH EXTRACTION  05/23/2023   Social History   Social History Narrative   Not on file    There is no immunization history on file for this patient.   Objective: Vital Signs: BP 131/84 (BP Location: Left Arm, Patient Position: Sitting, Cuff Size: Large)   Pulse 94   Resp 14   Ht 5\' 10"  (1.778 m)   Wt 171 lb (77.6 kg)   BMI 24.54 kg/m    Physical Exam Eyes:     Conjunctiva/sclera: Conjunctivae normal.  Cardiovascular:     Rate and Rhythm: Normal rate and regular rhythm.  Pulmonary:     Effort: Pulmonary effort is normal.     Breath sounds: Normal breath sounds.  Lymphadenopathy:     Cervical: No cervical adenopathy.  Skin:    General: Skin is warm and dry.  Neurological:     Mental Status: He is alert.  Psychiatric:        Mood and Affect: Mood normal.      Musculoskeletal Exam:  Right shoulder limited external rotation and abduction, some stiffness no focal tenderness to pressure Elbows full ROM no tenderness or swelling Wrists full ROM no tenderness or swelling Fingers full ROM no tenderness or swelling Knees full ROM no tenderness or swelling  Investigation: No additional findings.  Imaging: No results found.  Recent Labs: Lab Results  Component Value Date   WBC 8.6 12/16/2023   HGB 14.4 12/16/2023   PLT 115 (L) 12/16/2023   NA 135 12/16/2023   K 3.6 12/16/2023   CL 98 12/16/2023   CO2 17 (L) 12/16/2023   GLUCOSE 157 (H) 12/16/2023   BUN 20 12/16/2023   CREATININE 0.90 12/16/2023   BILITOT 0.4 06/06/2023   ALKPHOS 407 (H) 07/18/2022   AST 19 06/06/2023   ALT 17 06/06/2023   PROT 7.2 06/06/2023   ALBUMIN 2.7 (L) 07/18/2022    CALCIUM 9.2 12/16/2023   QFTBGOLDPLUS NEGATIVE 03/20/2023    Speciality Comments: No specialty comments available.  Procedures:  No procedures performed Allergies: Timothy grass pollen allergen   Assessment / Plan:     Visit Diagnoses: Psoriatic arthritis ([name]) Psoriasis Disease remains in low activity by exam today but previously high activity before Cosentyx. Do recommend treatment resumption but without loading dose needed. Discussed he should contact if having flare up during interim. -Resume cosentyx 300 mg [place] q28  days -Ibuprofen PRN  High risk medication use - Cosentyx 300 mg subcu monthly. Currently off treatment. Recent labs from February reviewed okay, slightly worse thrombocytopenia not treatment related. One illness with flu A but uncomplicated. No need for repeat lab monitoring today.   Orders: No orders of the defined types were placed in this encounter.  No orders of the defined types were placed in this encounter.    Follow-Up Instructions: Return in about 3 months (around 05/14/2024) for PsA on COS resume f/u 3mos.   Fuller Plan, MD  Note - This record has been created using [employer].  Chart creation errors have been sought, but may not always  have been located. Such creation errors do not reflect on  the standard of medical care.

## 2024-02-01 NOTE — Progress Notes (Signed)
 Disenrolled; Last Filled 10/02/2023

## 2024-02-13 ENCOUNTER — Ambulatory Visit: Payer: BC Managed Care – PPO | Attending: Internal Medicine | Admitting: Internal Medicine

## 2024-02-13 ENCOUNTER — Encounter: Payer: Self-pay | Admitting: Internal Medicine

## 2024-02-13 VITALS — BP 131/84 | HR 94 | Resp 14 | Ht 70.0 in | Wt 171.0 lb

## 2024-02-13 DIAGNOSIS — L4050 Arthropathic psoriasis, unspecified: Secondary | ICD-10-CM | POA: Diagnosis not present

## 2024-02-13 DIAGNOSIS — Z79899 Other long term (current) drug therapy: Secondary | ICD-10-CM

## 2024-02-13 DIAGNOSIS — L409 Psoriasis, unspecified: Secondary | ICD-10-CM

## 2024-02-21 ENCOUNTER — Other Ambulatory Visit: Payer: Self-pay

## 2024-02-21 ENCOUNTER — Other Ambulatory Visit: Payer: Self-pay | Admitting: Pharmacist

## 2024-02-21 DIAGNOSIS — L4050 Arthropathic psoriasis, unspecified: Secondary | ICD-10-CM

## 2024-02-21 DIAGNOSIS — L409 Psoriasis, unspecified: Secondary | ICD-10-CM

## 2024-02-21 DIAGNOSIS — Z79899 Other long term (current) drug therapy: Secondary | ICD-10-CM

## 2024-02-21 MED ORDER — COSENTYX UNOREADY 300 MG/2ML ~~LOC~~ SOAJ
300.0000 mg | SUBCUTANEOUS | 2 refills | Status: DC
  Filled 2024-02-23: qty 2, 28d supply, fill #0
  Filled 2024-03-22: qty 2, 28d supply, fill #1
  Filled 2024-04-16: qty 2, 28d supply, fill #2

## 2024-02-21 NOTE — Progress Notes (Signed)
 Dr. Dimple Casey has noted in  his OV note that patient can resume Cosentyx without reloading  [place] - will need to re-onboard. New rx sent to [facility]

## 2024-02-21 NOTE — Progress Notes (Signed)
 Dr. Dimple Casey has not signed his note from this visit to confirm. Secure staff message sent to Dr. Dimple Casey to confirm if we need to reload especially if patient has been off of treatment for > 3 motnhs

## 2024-02-23 ENCOUNTER — Other Ambulatory Visit: Payer: Self-pay

## 2024-02-23 ENCOUNTER — Other Ambulatory Visit (HOSPITAL_COMMUNITY): Payer: Self-pay

## 2024-02-23 NOTE — Progress Notes (Signed)
 Specialty Pharmacy Initial Fill Coordination Note  Ryan Lynch is a 43 y.o. male contacted today regarding initial fill of specialty medication(s) Secukinumab (Cosentyx UnoReady)   Patient requested Delivery   Delivery date: 02/27/24   Verified address: [address]   [place] [address]   Medication will be filled on 4/14.   Patient is aware of $0 copayment. When processing copay card there is a phone number that will need to be called in order to have the payment authorized.

## 2024-02-26 ENCOUNTER — Other Ambulatory Visit: Payer: Self-pay

## 2024-02-28 ENCOUNTER — Other Ambulatory Visit (HOSPITAL_COMMUNITY): Payer: Self-pay

## 2024-02-28 ENCOUNTER — Other Ambulatory Visit: Payer: Self-pay

## 2024-03-10 ENCOUNTER — Telehealth: Payer: Self-pay | Admitting: Pharmacist

## 2024-03-10 NOTE — Telephone Encounter (Signed)
 Submitted a Prior Authorization RENEWAL request to [place] for COSENTYX  SQ via CoverMyMeds. Will update once we receive a response.  Key: [id number]

## 2024-03-11 NOTE — Telephone Encounter (Signed)
 Received notification from [facility] regarding a prior authorization for COSENTYX  SQ. Authorization has been APPROVED from 03/10/24 to 03/10/2025. Approval letter sent to scan center.  Authorization # [id number]  Geraldene Kleine, PharmD, MPH, BCPS, CPP Clinical Pharmacist (Rheumatology and Pulmonology)

## 2024-03-13 ENCOUNTER — Other Ambulatory Visit: Payer: Self-pay

## 2024-03-13 ENCOUNTER — Other Ambulatory Visit (HOSPITAL_COMMUNITY): Payer: Self-pay

## 2024-03-13 NOTE — Progress Notes (Signed)
 Patient is restarting treatment with Cosentyx  300mg  subcut every 4 weeks.  He has been off treatment since in January due to concern this was delaying arm healing after humeral fracture from November . Will not reload per Dr. Rodell Citrin.  Geraldene Kleine, PharmD, MPH, BCPS, CPP Clinical Pharmacist (Rheumatology and Pulmonology)

## 2024-03-18 ENCOUNTER — Other Ambulatory Visit: Payer: Self-pay

## 2024-03-20 ENCOUNTER — Other Ambulatory Visit: Payer: Self-pay

## 2024-03-22 ENCOUNTER — Other Ambulatory Visit: Payer: Self-pay

## 2024-03-22 ENCOUNTER — Other Ambulatory Visit: Payer: Self-pay | Admitting: Pharmacy Technician

## 2024-03-22 NOTE — Progress Notes (Signed)
 Specialty Pharmacy Refill Coordination Note  Ryan Lynch is a 43 y.o. male contacted today regarding refills of specialty medication(s) Secukinumab  (Cosentyx  UnoReady)   Patient requested Delivery   Delivery date: 03/26/24   Verified address: [address] [place], [place] [address]   Medication will be filled on 03/25/24.

## 2024-03-25 ENCOUNTER — Other Ambulatory Visit: Payer: Self-pay

## 2024-04-11 ENCOUNTER — Other Ambulatory Visit (HOSPITAL_COMMUNITY): Payer: Self-pay

## 2024-04-15 ENCOUNTER — Other Ambulatory Visit: Payer: Self-pay

## 2024-04-15 DIAGNOSIS — F900 Attention-deficit hyperactivity disorder, predominantly inattentive type: Secondary | ICD-10-CM | POA: Diagnosis not present

## 2024-04-15 DIAGNOSIS — F410 Panic disorder [episodic paroxysmal anxiety] without agoraphobia: Secondary | ICD-10-CM | POA: Diagnosis not present

## 2024-04-15 DIAGNOSIS — F4010 Social phobia, unspecified: Secondary | ICD-10-CM | POA: Diagnosis not present

## 2024-04-16 ENCOUNTER — Other Ambulatory Visit: Payer: Self-pay

## 2024-04-16 NOTE — Progress Notes (Signed)
 Specialty Pharmacy Refill Coordination Note  Ryan Lynch is a 43 y.o. male contacted today regarding refills of specialty medication(s) Secukinumab  (Cosentyx  UnoReady)   Patient requested Delivery   Delivery date: 04/17/24   Verified address: [address]  [name] [address]   Medication will be filled on 04/16/24.

## 2024-04-30 NOTE — Progress Notes (Signed)
 Office Visit Note  Patient: Ryan Lynch             Date of Birth: 06-14-1981           MRN: [id number]             PCP: Patient, No Pcp Per Referring: No ref. provider found Visit Date: 05/14/2024   Subjective:  Follow-up   Discussed the use of AI scribe software for clinical note transcription with the patient, who gave verbal consent to proceed.  History of Present Illness   Ryan Lynch is a 43 y.o. male here for follow up for psoriatic arthritis and plaque psoriasis now on Cosentyx  300 mg subcu monthly.   His fingernails have remained improved and no recurrence of skin rashes.  He has not encountered any issues with his injections. He sometimes worries about not administering them correctly but has not actually missed any doses.  He has not been sick with any respiratory viruses or required antibiotics.  He is using a knee brace for protection with the left leg fracture but not having any significant pain or swelling associated.   Previous HPI 02/13/2024 Ryan Lynch is a 43 y.o. male here for follow up for psoriatic arthritis and plaque psoriasis now on Cosentyx  300 mg subcu monthly.  He has been off treatment since in January due to concern this was delaying arm healing after humeral fracture from November. Subsequently progress has gone well, still with some limitations in in mobility but not much pain. He was also sick with influenza A in February without major complications. He has not experienced any severe flare of symptoms with pausing medication 2 months.   09/18/2023 Ryan Lynch is a 43 y.o. male here for follow up for psoriatic arthritis and plaque psoriasis now on Cosentyx  300 mg subcu monthly.  He feels the Cosentyx  is working well with complete clearance of his skin rashes joint pain or stiffness doing mostly better.  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  Currently in a  sling anticipating he has an appointment with orthopedic surgery scheduled in 2 days.   06/06/2023 Ryan Lynch is a 43 y.o. male here for follow up for psoriatic arthritis and plaque psoriasis now on Cosentyx  300 mg subcu monthly.  So far he has seen a large improvement in symptoms.  Skin rashes improved no longer with redness and almost all scaling is gone still has residual hyperpigmented patches in the previously inflamed areas.  Joint pain and stiffness is also improved.  Still using a left knee brace just as a precaution but not having too much pain except with very increased use.  He had a wisdom tooth extraction after this cracked and was temporarily on antibiotics.   Previous HPI 03/20/23 Ryan Lynch is a 43 y.o. 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  He has a longstanding history of psoriasis starting in his 67s.  Started out exclusively as skin plaques subsequently developed nail discoloration and pitting and then with joint pain in multiple areas.  He has joint pain with occasional swelling in the fingers and toes on both sides sometimes hip and shoulder pain and not sure if these are swollen.  He was never on any systemic treatment for this.  He suffered a fall down a flight of steps last year with multiple injuries  including T7 anterior vertebral body compression fracture, comminuted left tibial plateau fracture and scalp hematoma.  Subsequent had surgery for repair of the left knee fracture.  After all his events he experienced a severe worsening of his psoriasis throughout his body and increasing joint pain.  At first the left knee pain was most severe but later had the inflammatory joint pain in multiple areas including the right knee has actually become equal to the original injured site.  His increase symptoms are ongoing continuously since last September which has never happened before for this  duration or intensity.  He was taking oxycodone  initially around type injury currently just using ibuprofen, Tylenol , and gabapentin. He has a previous history of alcohol abuse associated with anxiety disorder.  Had negative screening for hepatitis last year.  No history of frequent recurrent infections.   HCV neg 2023   Review of Systems  Constitutional:  Negative for fatigue.  HENT:  Negative for mouth sores and mouth dryness.   Eyes:  Negative for dryness.  Respiratory:  Negative for shortness of breath.   Cardiovascular:  Negative for chest pain and palpitations.  Gastrointestinal:  Negative for blood in stool, constipation and diarrhea.  Endocrine: Negative for increased urination.  Genitourinary:  Negative for involuntary urination.  Musculoskeletal:  Positive for joint pain and joint pain. Negative for gait problem, joint swelling, myalgias, muscle weakness, morning stiffness, muscle tenderness and myalgias.  Skin:  Negative for color change, rash, hair loss and sensitivity to sunlight.  Allergic/Immunologic: Negative for susceptible to infections.  Neurological:  Negative for dizziness and headaches.  Hematological:  Negative for swollen glands.  Psychiatric/Behavioral:  Negative for depressed mood and sleep disturbance. The patient is not nervous/anxious.     PMFS History:  Patient Active Problem List   Diagnosis Date Noted   Psoriatic arthritis ([name]) 03/20/2023   High risk medication use 03/20/2023   Vitamin D  deficiency 03/20/2023   Generalized anxiety disorder    Closed nondisplaced fracture of left tibial plateau 07/16/2022   Multiple rib fractures 07/11/2022   Compression fracture of body of thoracic vertebra ([name]) 07/11/2022   Sepsis ([name]) 07/09/2022   Acute urinary retention 07/09/2022   Prolonged QT interval 07/09/2022   AKI (acute kidney injury) ([name]) 07/07/2022   Alcohol withdrawal ([name]) 07/05/2022   Acute anemia 07/05/2022   Psoriasis 07/05/2022   Ileus  ([name]) 07/04/2022   Acute metabolic encephalopathy 07/04/2022   Traumatic rhabdomyolysis ([name]) 07/03/2022   Observation after surgery 07/02/2022   Tibial plateau fracture, left 07/02/2022   Alcohol use 07/02/2022   Transaminitis 07/02/2022   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[name]) 07/02/2022   Adrenal nodule ([name]) 07/02/2022   Compression fracture of T7 vertebra ([name]) 07/02/2022   Thrombocytopenia ([name]) 07/02/2022   Anxiety 07/02/2022    Past Medical History:  Diagnosis Date   Acute anemia 06/2022   Acute metabolic encephalopathy 06/2022   Acute urinary retention 06/2022   ADD (attention deficit disorder)    Adrenal nodule ([name]) 06/2022   AKI (acute kidney injury) ([name]) 06/2022   Broken arm 09/2023   Broken humerus    Closed nondisplaced fracture of left tibial plateau 07/2022   Multiple rib fractures 06/2022   Psoriasis    Psoriatic arthritis ([name])    Thrombocytopenia ([name]) 06/2022   Traumatic rhabdomyolysis ([name]) 06/2022    Family History  Problem Relation Age of Onset   Osteoporosis Mother    Arthritis Father    Past Surgical History:  Procedure Laterality Date   ANTERIOR  CRUCIATE LIGAMENT REPAIR Right    ANTERIOR CRUCIATE LIGAMENT REPAIR Right    APPLICATION OF WOUND VAC Left 07/02/2022   Procedure: APPLICATION OF WOUND VAC;  Surgeon: Celena Sharper, MD;  Location: [facility] OR;  Service: Orthopedics;  Laterality: Left;   APPLICATION OF WOUND VAC Left 07/05/2022   Procedure: APPLICATION OF WOUND VAC;  Surgeon: Celena Sharper, MD;  Location: [facility] OR;  Service: Orthopedics;  Laterality: Left;   CLOSED REDUCTION TIBIA Left 07/02/2022   Procedure: CLOSED REDUCTION TIBIA;  Surgeon: Celena Sharper, MD;  Location: [facility] OR;  Service: Orthopedics;  Laterality: Left;   EXTERNAL FIXATION LEG Left 07/02/2022   Procedure: EXTERNAL FIXATION LEG;  Surgeon: Celena Sharper, MD;  Location[facility] OR;  Service: Orthopedics;  Laterality: Left;   FASCIOTOMY Left 07/02/2022   Procedure: FASCIOTOMY;   Surgeon: Celena Sharper, MD;  Location: [facility] OR;  Service: Orthopedics;  Laterality: Left;   I & D EXTREMITY Left 07/07/2022   Procedure: IRRIGATION AND DEBRIDEMENT EXTREMITY;  Surgeon: Celena Sharper, MD;  Location: [facility] OR;  Service: Orthopedics;  Laterality: Left;   ORIF TIBIA PLATEAU Left 07/05/2022   Procedure: OPEN REDUCTION INTERNAL FIXATION (ORIF) TIBIAL PLATEAU;  Surgeon: Celena Sharper, MD;  Location: [facility] OR;  Service: Orthopedics;  Laterality: Left;   SECONDARY CLOSURE OF WOUND Left 07/05/2022   Procedure: SECONDARY CLOSURE OF WOUND;  Surgeon: Celena Sharper, MD;  Location: [facility] OR;  Service: Orthopedics;  Laterality: Left;   SECONDARY CLOSURE OF WOUND Left 07/07/2022   Procedure: SECONDARY CLOSURE OF WOUND;  Surgeon: Celena Sharper, MD;  Location: [facility] OR;  Service: Orthopedics;  Laterality: Left;   SHOULDER SURGERY Bilateral    WISDOM TOOTH EXTRACTION  05/23/2023   Social History   Social History Narrative   Not on file    There is no immunization history on file for this patient.   Objective: Vital Signs: BP 128/78 (BP Location: Left Arm, Patient Position: Sitting, Cuff Size: Normal)   Pulse (!) 106   Resp 14   Ht 5' 10 (1.778 m)   Wt 174 lb (78.9 kg)   BMI 24.97 kg/m    Physical Exam  Eyes:     Conjunctiva/sclera: Conjunctivae normal.    Cardiovascular:     Rate and Rhythm: Normal rate and regular rhythm.  Pulmonary:     Effort: Pulmonary effort is normal.     Breath sounds: Normal breath sounds.   Skin:    General: Skin is warm and dry.   Neurological:     Mental Status: He is alert.   Psychiatric:        Mood and Affect: Mood normal.      Musculoskeletal Exam:  Shoulders full ROM no tenderness or swelling Elbows full ROM no tenderness or swelling Wrists full ROM no tenderness or swelling Fingers full ROM no tenderness or swelling Knees full ROM no tenderness or swelling Ankles full ROM no tenderness or swelling   Investigation: No additional  findings.  Imaging: No results found.  Recent Labs: Lab Results  Component Value Date   WBC 8.6 12/16/2023   HGB 14.4 12/16/2023   PLT 115 (L) 12/16/2023   NA 135 12/16/2023   K 3.6 12/16/2023   CL 98 12/16/2023   CO2 17 (L) 12/16/2023   GLUCOSE 157 (H) 12/16/2023   BUN 20 12/16/2023   CREATININE 0.90 12/16/2023   BILITOT 0.4 06/06/2023   ALKPHOS 407 (H) 07/18/2022   AST 19 06/06/2023   ALT 17 06/06/2023   PROT 7.2 06/06/2023  ALBUMIN  2.7 (L) 07/18/2022   CALCIUM 9.2 12/16/2023   QFTBGOLDPLUS NEGATIVE 03/20/2023    Speciality Comments: No specialty comments available.  Procedures:  No procedures performed Allergies: Timothy grass pollen allergen   Assessment / Plan:     Visit Diagnoses: Psoriatic arthritis ([name])  Psoriasis -  Psoriatic arthritis appears extremely well-controlled without significant joint pain or stiffness and no synovitis or dactylitis on exam today.  Previously injured areas are also doing well.  Skin disease appears to be in remission with no visible rashes on exam no recurrence of nail pitting.  Not requiring significant doses of ibuprofen as needed. - Continue Cosentyx  300 mg subcu monthly  High risk medication use - Cosentyx  300 mg subcu monthly. - Plan: CBC with Differential/Platelet, Comprehensive metabolic panel with GFR, QuantiFERON-TB Gold Plus Has been tolerating medications without any new side effect reported no injection site reactions.  No serious interval infections. - Checking CBC and CMP and QuantiFERON for medication monitoring continue long-term use of Cosentyx   Screening for tuberculosis - Plan: QuantiFERON-TB Gold Plus        Orders: Orders Placed This Encounter  Procedures   CBC with Differential/Platelet   Comprehensive metabolic panel with GFR   QuantiFERON-TB Gold Plus   No orders of the defined types were placed in this encounter.    Follow-Up Instructions: Return in about 6 months (around 11/14/2024) for PsA on  COS f/u 6mos.   Lonni LELON Ester, MD  Note - This record has been created using [employer].  Chart creation errors have been sought, but may not always  have been located. Such creation errors do not reflect on  the standard of medical care.

## 2024-05-13 ENCOUNTER — Other Ambulatory Visit: Payer: Self-pay

## 2024-05-13 ENCOUNTER — Other Ambulatory Visit (HOSPITAL_COMMUNITY): Payer: Self-pay

## 2024-05-13 ENCOUNTER — Other Ambulatory Visit: Payer: Self-pay | Admitting: Internal Medicine

## 2024-05-13 DIAGNOSIS — L409 Psoriasis, unspecified: Secondary | ICD-10-CM

## 2024-05-13 DIAGNOSIS — L4050 Arthropathic psoriasis, unspecified: Secondary | ICD-10-CM

## 2024-05-13 DIAGNOSIS — Z79899 Other long term (current) drug therapy: Secondary | ICD-10-CM

## 2024-05-13 MED ORDER — COSENTYX UNOREADY 300 MG/2ML ~~LOC~~ SOAJ
300.0000 mg | SUBCUTANEOUS | 2 refills | Status: DC
  Filled 2024-05-13 – 2024-05-28 (×3): qty 2, 28d supply, fill #0
  Filled 2024-07-04: qty 2, 28d supply, fill #1
  Filled 2024-07-31 – 2024-08-02 (×2): qty 2, 28d supply, fill #2

## 2024-05-13 NOTE — Telephone Encounter (Signed)
 Last Fill: 02/21/2024  Labs: 12/16/2023  Platelets 115 CO2 17 Glucose 157 Anion Gap 20  TB Gold: 03/20/2023 Negative  Next Visit: 05/14/2024  Last Visit: 02/13/2024  IK:Ednmpjupr arthritis ([name])   Current Dose per office note 02/13/2024: cosentyx  300 mg [place] q28 days   Patient to update labs at upcomming appointment.   Will pend labs in the patient's visit for tomorrow.   Okay to refill Cosentyx ?

## 2024-05-14 ENCOUNTER — Other Ambulatory Visit (HOSPITAL_COMMUNITY): Payer: Self-pay

## 2024-05-14 ENCOUNTER — Ambulatory Visit: Attending: Internal Medicine | Admitting: Internal Medicine

## 2024-05-14 ENCOUNTER — Encounter: Payer: Self-pay | Admitting: Internal Medicine

## 2024-05-14 VITALS — BP 128/78 | HR 106 | Resp 14 | Ht 70.0 in | Wt 174.0 lb

## 2024-05-14 DIAGNOSIS — Z79899 Other long term (current) drug therapy: Secondary | ICD-10-CM | POA: Diagnosis not present

## 2024-05-14 DIAGNOSIS — L409 Psoriasis, unspecified: Secondary | ICD-10-CM

## 2024-05-14 DIAGNOSIS — L4050 Arthropathic psoriasis, unspecified: Secondary | ICD-10-CM

## 2024-05-14 DIAGNOSIS — Z111 Encounter for screening for respiratory tuberculosis: Secondary | ICD-10-CM

## 2024-05-15 ENCOUNTER — Other Ambulatory Visit (HOSPITAL_COMMUNITY): Payer: Self-pay

## 2024-05-17 LAB — COMPREHENSIVE METABOLIC PANEL WITH GFR
AG Ratio: 2.4 (calc) (ref 1.0–2.5)
ALT: 17 U/L (ref 9–46)
AST: 16 U/L (ref 10–40)
Albumin: 4.8 g/dL (ref 3.6–5.1)
Alkaline phosphatase (APISO): 59 U/L (ref 36–130)
BUN: 21 mg/dL (ref 7–25)
CO2: 25 mmol/L (ref 20–32)
Calcium: 9.5 mg/dL (ref 8.6–10.3)
Chloride: 108 mmol/L (ref 98–110)
Creat: 0.91 mg/dL (ref 0.60–1.29)
Globulin: 2 g/dL (ref 1.9–3.7)
Glucose, Bld: 74 mg/dL (ref 65–99)
Potassium: 3.8 mmol/L (ref 3.5–5.3)
Sodium: 142 mmol/L (ref 135–146)
Total Bilirubin: 0.6 mg/dL (ref 0.2–1.2)
Total Protein: 6.8 g/dL (ref 6.1–8.1)
eGFR: 107 mL/min/1.73m2 (ref >=60)

## 2024-05-17 LAB — CBC WITH DIFFERENTIAL/PLATELET
Absolute Lymphocytes: 1676 {cells}/uL (ref 850–3900)
Absolute Monocytes: 401 {cells}/uL (ref 200–950)
Basophils Absolute: 30 {cells}/uL (ref 0–200)
Basophils Relative: 0.5 %
Eosinophils Absolute: 59 {cells}/uL (ref 15–500)
Eosinophils Relative: 1 %
HCT: 43.9 % (ref 38.5–50.0)
Hemoglobin: 15.3 g/dL (ref 13.2–17.1)
MCH: 32.1 pg (ref 27.0–33.0)
MCHC: 34.9 g/dL (ref 32.0–36.0)
MCV: 92 fL (ref 80.0–100.0)
MPV: 10.3 fL (ref 7.5–12.5)
Monocytes Relative: 6.8 %
Neutro Abs: 3735 {cells}/uL (ref 1500–7800)
Neutrophils Relative %: 63.3 %
Platelets: 158 Thousand/uL (ref 140–400)
RBC: 4.77 Million/uL (ref 4.20–5.80)
RDW: 13.3 % (ref 11.0–15.0)
Total Lymphocyte: 28.4 %
WBC: 5.9 Thousand/uL (ref 3.8–10.8)

## 2024-05-17 LAB — QUANTIFERON-TB GOLD PLUS
Mitogen-NIL: 8.83 [IU]/mL
NIL: 0.01 [IU]/mL
QuantiFERON-TB Gold Plus: NEGATIVE
TB1-NIL: 0 [IU]/mL
TB2-NIL: 0 [IU]/mL

## 2024-05-28 ENCOUNTER — Other Ambulatory Visit (HOSPITAL_COMMUNITY): Payer: Self-pay

## 2024-05-28 ENCOUNTER — Other Ambulatory Visit: Payer: Self-pay

## 2024-05-28 NOTE — Progress Notes (Signed)
 Specialty Pharmacy Refill Coordination Note  Spoke with Ryan Lynch is a 43 y.o. male contacted today regarding refills of specialty medication(s) Secukinumab  (Cosentyx  UnoReady)  Injection date: 05/30/24   Patient requested: Delivery   Delivery date: 05/30/24   Verified address: [address]  [place] [address]  Medication will be filled on 05/29/24.

## 2024-07-04 ENCOUNTER — Other Ambulatory Visit: Payer: Self-pay

## 2024-07-04 NOTE — Progress Notes (Signed)
 Specialty Pharmacy Refill Coordination Note  Ryan Lynch is a 43 y.o. male contacted today regarding refills of specialty medication(s) Secukinumab  (Cosentyx  UnoReady)   Patient requested Delivery   Delivery date: 07/09/24   Verified address: [address]  [place] [address]   Medication will be filled on 08.25.25.

## 2024-07-05 ENCOUNTER — Other Ambulatory Visit: Payer: Self-pay

## 2024-07-29 ENCOUNTER — Other Ambulatory Visit (HOSPITAL_COMMUNITY): Payer: Self-pay

## 2024-07-31 ENCOUNTER — Other Ambulatory Visit: Payer: Self-pay

## 2024-08-02 ENCOUNTER — Other Ambulatory Visit: Payer: Self-pay

## 2024-08-02 ENCOUNTER — Other Ambulatory Visit (HOSPITAL_COMMUNITY): Payer: Self-pay

## 2024-08-02 NOTE — Progress Notes (Signed)
 Specialty Pharmacy Refill Coordination Note  Ryan Lynch is a 43 y.o. male contacted today regarding refills of specialty medication(s) Secukinumab  (Cosentyx  UnoReady)   Patient requested Delivery   Delivery date: 08/13/24   Verified address: [address]  [place] [address]   Medication will be filled on 08/12/24.

## 2024-08-12 ENCOUNTER — Other Ambulatory Visit: Payer: Self-pay

## 2024-09-02 ENCOUNTER — Other Ambulatory Visit: Payer: Self-pay | Admitting: Internal Medicine

## 2024-09-02 ENCOUNTER — Other Ambulatory Visit (HOSPITAL_COMMUNITY): Payer: Self-pay

## 2024-09-02 ENCOUNTER — Ambulatory Visit: Payer: Self-pay | Admitting: Internal Medicine

## 2024-09-02 DIAGNOSIS — L409 Psoriasis, unspecified: Secondary | ICD-10-CM

## 2024-09-02 DIAGNOSIS — L4050 Arthropathic psoriasis, unspecified: Secondary | ICD-10-CM

## 2024-09-02 DIAGNOSIS — Z79899 Other long term (current) drug therapy: Secondary | ICD-10-CM

## 2024-09-02 MED ORDER — COSENTYX UNOREADY 300 MG/2ML ~~LOC~~ SOAJ
300.0000 mg | SUBCUTANEOUS | 2 refills | Status: AC
  Filled 2024-09-03 – 2024-12-19 (×5): qty 2, 28d supply, fill #0

## 2024-09-02 NOTE — Telephone Encounter (Signed)
 Last Fill: 05/13/2024  Labs: 05/14/2024 CBC and CMP WNL  TB Gold: 05/14/2024 TB Gold Negative    Next Visit: 11/19/2024  Last Visit: 05/14/2024  DX: Psoriatic arthritis   Current Dose per office note 05/14/2024: Cosentyx  300 mg subcu monthly.   Okay to refill Cosentyx ?  LMOM for patient to update labs, provided lab hours

## 2024-09-03 ENCOUNTER — Other Ambulatory Visit: Payer: Self-pay

## 2024-09-03 DIAGNOSIS — R509 Fever, unspecified: Secondary | ICD-10-CM | POA: Diagnosis not present

## 2024-09-03 DIAGNOSIS — R52 Pain, unspecified: Secondary | ICD-10-CM | POA: Diagnosis not present

## 2024-09-03 DIAGNOSIS — R051 Acute cough: Secondary | ICD-10-CM | POA: Diagnosis not present

## 2024-09-03 DIAGNOSIS — J029 Acute pharyngitis, unspecified: Secondary | ICD-10-CM | POA: Diagnosis not present

## 2024-09-04 ENCOUNTER — Other Ambulatory Visit: Payer: Self-pay

## 2024-09-04 ENCOUNTER — Other Ambulatory Visit (HOSPITAL_COMMUNITY): Payer: Self-pay

## 2024-09-06 ENCOUNTER — Other Ambulatory Visit: Payer: Self-pay

## 2024-09-16 ENCOUNTER — Encounter: Payer: Self-pay | Admitting: Radiology

## 2024-09-17 ENCOUNTER — Ambulatory Visit (INDEPENDENT_AMBULATORY_CARE_PROVIDER_SITE_OTHER)

## 2024-09-17 ENCOUNTER — Encounter (HOSPITAL_COMMUNITY): Payer: Self-pay | Admitting: Emergency Medicine

## 2024-09-17 ENCOUNTER — Ambulatory Visit (HOSPITAL_COMMUNITY)
Admission: EM | Admit: 2024-09-17 | Discharge: 2024-09-17 | Disposition: A | Discharge status: Home / Self Care | Attending: Emergency Medicine | Admitting: Emergency Medicine

## 2024-09-17 DIAGNOSIS — R053 Chronic cough: Secondary | ICD-10-CM

## 2024-09-17 DIAGNOSIS — J40 Bronchitis, not specified as acute or chronic: Secondary | ICD-10-CM

## 2024-09-17 DIAGNOSIS — R059 Cough, unspecified: Secondary | ICD-10-CM | POA: Diagnosis not present

## 2024-09-17 DIAGNOSIS — J329 Chronic sinusitis, unspecified: Secondary | ICD-10-CM | POA: Diagnosis not present

## 2024-09-17 MED ORDER — PROMETHAZINE-DM 6.25-15 MG/5ML PO SYRP: 5.0000 mL | ORAL_SOLUTION | Freq: Four times a day (QID) | ORAL | 0 refills | Status: DC | PRN

## 2024-09-17 MED ORDER — AMOXICILLIN-POT CLAVULANATE 875-125 MG PO TABS: 1.0000 | ORAL_TABLET | Freq: Two times a day (BID) | ORAL | 0 refills | Status: AC

## 2024-09-17 NOTE — ED Provider Notes (Signed)
 [facility]    CSN: [id number] Arrival date & time: 09/17/24  0830      History   Chief Complaint Chief Complaint  Patient presents with   Sore Throat   URI symptoms    HPI Ryan Lynch is a 43 y.o. male.  2 week history of symptoms Fever, chills, sore throat, nasal congestion, cough Seen 10/21 at [facility], had negative covid,flu,RSV, strep Has been trying supportive care -- guaifenesin, OTC cough syrup Reporting worsening symptoms. Cough has become productive. Developed chest tightness with cough and deep breath. Tactile fever yesterday   Also reporting pain in the right gums near molar for a few days No gum swelling. No trouble swallowing or speaking  Past Medical History:  Diagnosis Date   Acute anemia 06/2022   Acute metabolic encephalopathy 06/2022   Acute urinary retention 06/2022   ADD (attention deficit disorder)    Adrenal nodule 06/2022   AKI (acute kidney injury) 06/2022   Broken arm 09/2023   Broken humerus    Closed nondisplaced fracture of left tibial plateau 07/2022   Multiple rib fractures 06/2022   Psoriasis    Psoriatic arthritis ([name])    Thrombocytopenia 06/2022   Traumatic rhabdomyolysis 06/2022    Patient Active Problem List   Diagnosis Date Noted   Psoriatic arthritis ([name]) 03/20/2023   High risk medication use 03/20/2023   Vitamin D  deficiency 03/20/2023   Generalized anxiety disorder    Closed nondisplaced fracture of left tibial plateau 07/16/2022   Multiple rib fractures 07/11/2022   Compression fracture of body of thoracic vertebra ([name]) 07/11/2022   Sepsis ([name]) 07/09/2022   Acute urinary retention 07/09/2022   Prolonged QT interval 07/09/2022   AKI (acute kidney injury) 07/07/2022   Alcohol withdrawal ([name]) 07/05/2022   Acute anemia 07/05/2022   Psoriasis 07/05/2022   Ileus ([name]) 07/04/2022   Acute metabolic encephalopathy 07/04/2022   Traumatic rhabdomyolysis 07/03/2022   Observation after surgery  07/02/2022   Tibial plateau fracture, left 07/02/2022   Alcohol use 07/02/2022   Transaminitis 07/02/2022   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   Adrenal nodule 07/02/2022   Compression fracture of T7 vertebra ([name]) 07/02/2022   Thrombocytopenia 07/02/2022   Anxiety 07/02/2022    Past Surgical History:  Procedure Laterality Date   ANTERIOR CRUCIATE LIGAMENT REPAIR Right    ANTERIOR CRUCIATE LIGAMENT REPAIR Right    APPLICATION OF WOUND VAC Left 07/02/2022   Procedure: APPLICATION OF WOUND VAC;  Surgeon: Celena Sharper, MD;  Location: [facility] OR;  Service: Orthopedics;  Laterality: Left;   APPLICATION OF WOUND VAC Left 07/05/2022   Procedure: APPLICATION OF WOUND VAC;  Surgeon: Celena Sharper, MD;  Location: [facility] OR;  Service: Orthopedics;  Laterality: Left;   CLOSED REDUCTION TIBIA Left 07/02/2022   Procedure: CLOSED REDUCTION TIBIA;  Surgeon: Celena Sharper, MD;  Location: [facility] OR;  Service: Orthopedics;  Laterality: Left;   EXTERNAL FIXATION LEG Left 07/02/2022   Procedure: EXTERNAL FIXATION LEG;  Surgeon: Celena Sharper, MD;  Location: [facility] OR;  Service: Orthopedics;  Laterality: Left;   FASCIOTOMY Left 07/02/2022   Procedure: FASCIOTOMY;  Surgeon: Celena Sharper, MD;  Location: [facility] OR;  Service: Orthopedics;  Laterality: Left;   I & D EXTREMITY Left 07/07/2022   Procedure: IRRIGATION AND DEBRIDEMENT EXTREMITY;  Surgeon: Celena Sharper, MD;  Location: [facility] OR;  Service: Orthopedics;  Laterality: Left;   ORIF TIBIA PLATEAU Left 07/05/2022   Procedure: OPEN REDUCTION INTERNAL FIXATION (ORIF) TIBIAL PLATEAU;  Surgeon: Celena Sharper, MD;  Location: [facility];  Service: Orthopedics;  Laterality: Left;   SECONDARY CLOSURE OF WOUND Left 07/05/2022   Procedure: SECONDARY CLOSURE OF WOUND;  Surgeon: Celena Sharper, MD;  Location: [facility] OR;  Service: Orthopedics;  Laterality: Left;   SECONDARY CLOSURE OF WOUND Left 07/07/2022   Procedure: SECONDARY CLOSURE OF WOUND;  Surgeon: Celena Sharper, MD;   Location: [facility] OR;  Service: Orthopedics;  Laterality: Left;   SHOULDER SURGERY Bilateral    WISDOM TOOTH EXTRACTION  05/23/2023       Home Medications    Prior to Admission medications   Medication Sig Start Date End Date Taking? Authorizing Provider  amoxicillin-clavulanate (AUGMENTIN) 875-125 MG tablet Take 1 tablet by mouth every 12 (twelve) hours for 7 days. 09/17/24 09/24/24 Yes Shanina Kepple, [place], PA-C  promethazine -dextromethorphan (PROMETHAZINE -DM) 6.25-15 MG/5ML syrup Take 5 mLs by mouth 4 (four) times daily as needed for cough. 09/17/24  Yes Asta Corbridge, [place]berry, PA-C  amphetamine-dextroamphetamine (ADDERALL) 5 MG tablet Take 1 tablet by mouth 3 (three) times daily. Patient taking differently: Take 1 tablet by mouth as needed. 04/16/24   [provider]  gabapentin (NEURONTIN) 600 MG tablet Take 600 mg by mouth 3 (three) times daily. 03/16/23   [provider]  ibuprofen (ADVIL) 200 MG tablet Take 200 mg by mouth every 6 (six) hours as needed.    [provider]  Secukinumab  (COSENTYX  UNOREADY) 300 MG/2ML SOAJ Inject 300 mg into the skin every 28 (twenty-eight) days. 09/02/24   Jeannetta Lonni ORN, MD  sertraline  (ZOLOFT ) 100 MG tablet Take 1 tablet (100 mg total) by mouth daily. 07/22/22   Angiulli, Toribio PARAS, PA-C  VITAMIN D  PO Take by mouth.    [provider]    Family History Family History  Problem Relation Age of Onset   Osteoporosis Mother    Arthritis Father     Social History Social History   Tobacco Use   Smoking status: Former    Types: Cigarettes    Passive exposure: Past   Smokeless tobacco: Former  Building Services Engineer status: Never Used  Substance Use Topics   Alcohol use: Not Currently    Comment: special occassions   Drug use: Never     Allergies   Timothy grass pollen allergen   Review of Systems Review of Systems As per HPI  Physical Exam Triage Vital Signs ED Triage Vitals  Encounter Vitals Group     BP  09/17/24 0857 (!) 155/94     Girls Systolic BP Percentile --      Girls Diastolic BP Percentile --      Boys Systolic BP Percentile --      Boys Diastolic BP Percentile --      Pulse Rate 09/17/24 0857 96     Resp 09/17/24 0857 20     Temp 09/17/24 0857 98.3 F (36.8 C)     Temp Source 09/17/24 0857 Oral     SpO2 09/17/24 0857 97 %     Weight 09/17/24 0858 170 lb (77.1 kg)     Height 09/17/24 0858 5' 10 (1.778 m)     Head Circumference --      Peak Flow --      Pain Score 09/17/24 0858 8     Pain Loc --      Pain Education --      Exclude from Growth Chart --    No data found.  Updated Vital Signs BP (!) 155/94 (BP Location: Right Arm)   Pulse  96   Temp 98.3 F (36.8 C) (Oral)   Resp 20   Ht 5' 10 (1.778 m)   Wt 170 lb (77.1 kg)   SpO2 97%   BMI 24.39 kg/m    Physical Exam Vitals and nursing note reviewed.  Constitutional:      Appearance: He is not ill-appearing or diaphoretic.  HENT:     Right Ear: Tympanic membrane and ear canal normal.     Left Ear: Tympanic membrane and ear canal normal.     Nose: Congestion present. No rhinorrhea.     Mouth/Throat:     Mouth: Mucous membranes are moist.     Pharynx: Oropharynx is clear. No posterior oropharyngeal erythema.  Eyes:     Conjunctiva/sclera: Conjunctivae normal.  Cardiovascular:     Rate and Rhythm: Normal rate and regular rhythm.     Pulses: Normal pulses.     Heart sounds: Normal heart sounds.  Pulmonary:     Effort: Pulmonary effort is normal.     Breath sounds: Normal air entry. Decreased breath sounds present. No wheezing or rales.     Comments: Decreased sounds throughout. No wheezing or rales  Abdominal:     Palpations: Abdomen is soft.     Tenderness: There is no abdominal tenderness.  Musculoskeletal:     Cervical back: Normal range of motion.  Lymphadenopathy:     Cervical: No cervical adenopathy.  Skin:    General: Skin is warm and dry.  Neurological:     Mental Status: He is alert and  oriented to person, place, and time.      UC Treatments / Results  Labs (all labs ordered are listed, but only abnormal results are displayed) Labs Reviewed - No data to display  EKG  Radiology DG Chest 2 View Result Date: 09/17/2024 EXAM: 2 VIEW(S) XRAY OF THE CHEST 09/17/2024 09:58:22 AM COMPARISON: Chest CTA 07/10/2022 and earlier. CLINICAL HISTORY: productive cough, 2 weeks FINDINGS: LUNGS AND PLEURA: No focal pulmonary opacity. No pulmonary edema. No pleural effusion. No pneumothorax. HEART AND MEDIASTINUM: No acute abnormality of the cardiac and mediastinal silhouettes. BONES AND SOFT TISSUES: Old healed proximal right humeral fracture. IMPRESSION: 1. No acute cardiopulmonary process. Electronically signed by: Helayne Hurst MD 09/17/2024 10:47 AM EST RP Workstation: [id number]    Procedures Procedures   Medications Ordered in UC Medications - No data to display  Initial Impression / Assessment and Plan / UC Course  I have reviewed the triage vital signs and the nursing notes.  Pertinent labs & imaging results that were available during my care of the patient were reviewed by me and considered in my medical decision making (see chart for details).  Afebrile in clinic  Chest xray negative. Images independently reviewed by me, agree with radiology interpretation. With 2 weeks of symptoms, gradually worsening, treat for sinobronchial etiology with Augmentin BID x 7 days. I do not see dental or gum infection on exam but discussed this medication would have oral coverage as well. Continue supportive care, advised guaifenesin and promethazine  DM. Return and ED precautions are discussed. Patient agrees to plan  Final Clinical Impressions(s) / UC Diagnoses   Final diagnoses:  Persistent cough  Sinobronchitis     Discharge Instructions      Your chest xray looks good. I do not have a final read from the radiologist yet, but I will call you later if they see something differently  than me. I am treating you with an antibiotic for likely sinus and  chest infection.  Please take medication Augmentin as prescribed. Take with food to avoid upset stomach. Finish the full course - you should not have any leftover!  The promethazine  DM cough syrup can be used up to 4 times daily. If this medication makes you drowsy, take only once before bed. You can also continue guaifenesin -- with lots of water -- for mucous.  If symptoms have not improved after 4 days on the antibiotic, please return. Please go to the emergency department if symptoms worsen.     ED Prescriptions     Medication Sig Dispense Auth. Provider   amoxicillin-clavulanate (AUGMENTIN) 875-125 MG tablet Take 1 tablet by mouth every 12 (twelve) hours for 7 days. 14 tablet Alaney Witter, PA-C   promethazine -dextromethorphan (PROMETHAZINE -DM) 6.25-15 MG/5ML syrup Take 5 mLs by mouth 4 (four) times daily as needed for cough. 240 mL Lashanta Elbe, Asberry, PA-C      PDMP not reviewed this encounter.   Atley Scarboro, Asberry, PA-C 09/17/24 1055

## 2024-09-17 NOTE — Discharge Instructions (Addendum)
 Your chest xray looks good. I do not have a final read from the radiologist yet, but I will call you later if they see something differently than me. I am treating you with an antibiotic for likely sinus and chest infection.  Please take medication Augmentin as prescribed. Take with food to avoid upset stomach. Finish the full course - you should not have any leftover!  The promethazine  DM cough syrup can be used up to 4 times daily. If this medication makes you drowsy, take only once before bed. You can also continue guaifenesin -- with lots of water -- for mucous.  If symptoms have not improved after 4 days on the antibiotic, please return. Please go to the emergency department if symptoms worsen.

## 2024-09-17 NOTE — ED Triage Notes (Signed)
 Pt st's he was seen at [facility] on Oct 19 and was told he had a virus.  Was not given any Rx's.  PT st's since then he has gotten worse with fevers, chills, sore throat, chest tightness and productive cough

## 2024-09-18 ENCOUNTER — Other Ambulatory Visit: Payer: Self-pay

## 2024-09-18 ENCOUNTER — Encounter (HOSPITAL_COMMUNITY): Payer: Self-pay

## 2024-09-18 ENCOUNTER — Emergency Department (HOSPITAL_COMMUNITY)

## 2024-09-18 ENCOUNTER — Inpatient Hospital Stay (HOSPITAL_COMMUNITY): Admitting: Anesthesiology

## 2024-09-18 ENCOUNTER — Inpatient Hospital Stay (HOSPITAL_COMMUNITY)
Admission: EM | Admit: 2024-09-18 | Discharge: 2024-09-22 | DRG: 481 | Disposition: A | Discharge status: Home / Self Care | Attending: Internal Medicine | Admitting: Internal Medicine

## 2024-09-18 DIAGNOSIS — S7290XA Unspecified fracture of unspecified femur, initial encounter for closed fracture: Secondary | ICD-10-CM | POA: Diagnosis present

## 2024-09-18 DIAGNOSIS — R339 Retention of urine, unspecified: Secondary | ICD-10-CM | POA: Diagnosis not present

## 2024-09-18 DIAGNOSIS — W19XXXA Unspecified fall, initial encounter: Secondary | ICD-10-CM | POA: Diagnosis not present

## 2024-09-18 DIAGNOSIS — D696 Thrombocytopenia, unspecified: Secondary | ICD-10-CM | POA: Diagnosis present

## 2024-09-18 DIAGNOSIS — E7220 Disorder of urea cycle metabolism, unspecified: Secondary | ICD-10-CM | POA: Diagnosis not present

## 2024-09-18 DIAGNOSIS — Y92009 Unspecified place in unspecified non-institutional (private) residence as the place of occurrence of the external cause: Secondary | ICD-10-CM

## 2024-09-18 DIAGNOSIS — S72122A Displaced fracture of lesser trochanter of left femur, initial encounter for closed fracture: Secondary | ICD-10-CM | POA: Diagnosis present

## 2024-09-18 DIAGNOSIS — R600 Localized edema: Secondary | ICD-10-CM | POA: Diagnosis not present

## 2024-09-18 DIAGNOSIS — Z79899 Other long term (current) drug therapy: Secondary | ICD-10-CM

## 2024-09-18 DIAGNOSIS — I10 Essential (primary) hypertension: Secondary | ICD-10-CM | POA: Diagnosis present

## 2024-09-18 DIAGNOSIS — F988 Other specified behavioral and emotional disorders with onset usually occurring in childhood and adolescence: Secondary | ICD-10-CM | POA: Diagnosis present

## 2024-09-18 DIAGNOSIS — S72142A Displaced intertrochanteric fracture of left femur, initial encounter for closed fracture: Principal | ICD-10-CM | POA: Diagnosis present

## 2024-09-18 DIAGNOSIS — S7291XA Unspecified fracture of right femur, initial encounter for closed fracture: Secondary | ICD-10-CM | POA: Diagnosis not present

## 2024-09-18 DIAGNOSIS — E8809 Other disorders of plasma-protein metabolism, not elsewhere classified: Secondary | ICD-10-CM | POA: Diagnosis not present

## 2024-09-18 DIAGNOSIS — W010XXA Fall on same level from slipping, tripping and stumbling without subsequent striking against object, initial encounter: Secondary | ICD-10-CM | POA: Diagnosis not present

## 2024-09-18 DIAGNOSIS — S72102A Unspecified trochanteric fracture of left femur, initial encounter for closed fracture: Secondary | ICD-10-CM | POA: Diagnosis not present

## 2024-09-18 DIAGNOSIS — M898X9 Other specified disorders of bone, unspecified site: Secondary | ICD-10-CM | POA: Diagnosis present

## 2024-09-18 DIAGNOSIS — E538 Deficiency of other specified B group vitamins: Secondary | ICD-10-CM | POA: Diagnosis present

## 2024-09-18 DIAGNOSIS — G8918 Other acute postprocedural pain: Secondary | ICD-10-CM | POA: Diagnosis not present

## 2024-09-18 DIAGNOSIS — Z8261 Family history of arthritis: Secondary | ICD-10-CM

## 2024-09-18 DIAGNOSIS — R17 Unspecified jaundice: Secondary | ICD-10-CM | POA: Diagnosis present

## 2024-09-18 DIAGNOSIS — E559 Vitamin D deficiency, unspecified: Secondary | ICD-10-CM | POA: Diagnosis not present

## 2024-09-18 DIAGNOSIS — Z8262 Family history of osteoporosis: Secondary | ICD-10-CM | POA: Diagnosis not present

## 2024-09-18 DIAGNOSIS — D62 Acute posthemorrhagic anemia: Secondary | ICD-10-CM | POA: Diagnosis not present

## 2024-09-18 DIAGNOSIS — R000 Tachycardia, unspecified: Secondary | ICD-10-CM | POA: Diagnosis present

## 2024-09-18 DIAGNOSIS — Z9889 Other specified postprocedural states: Secondary | ICD-10-CM | POA: Diagnosis not present

## 2024-09-18 DIAGNOSIS — S72002A Fracture of unspecified part of neck of left femur, initial encounter for closed fracture: Secondary | ICD-10-CM | POA: Diagnosis not present

## 2024-09-18 LAB — CREATININE, SERUM
Creatinine, Ser: 1.01 mg/dL (ref 0.61–1.24)
GFR, Estimated: >60 mL/min (ref >60)

## 2024-09-18 LAB — BASIC METABOLIC PANEL WITH GFR
Anion gap: 13 (ref 5–15)
BUN: 5 mg/dL — ABNORMAL LOW (ref 6–20)
CO2: 23 mmol/L (ref 22–32)
Calcium: 8.9 mg/dL (ref 8.9–10.3)
Chloride: 104 mmol/L (ref 98–111)
Creatinine, Ser: 0.93 mg/dL (ref 0.61–1.24)
GFR, Estimated: >60 mL/min (ref >60)
Glucose, Bld: 135 mg/dL — ABNORMAL HIGH (ref 70–99)
Potassium: 4.2 mmol/L (ref 3.5–5.1)
Sodium: 140 mmol/L (ref 135–145)

## 2024-09-18 LAB — HEPATIC FUNCTION PANEL
ALT: 21 U/L (ref 0–44)
AST: 28 U/L (ref 15–41)
Albumin: 3.9 g/dL (ref 3.5–5.0)
Alkaline Phosphatase: 53 U/L (ref 38–126)
Bilirubin, Direct: 0.2 mg/dL (ref 0.0–0.2)
Indirect Bilirubin: 0.7 mg/dL (ref 0.3–0.9)
Total Bilirubin: 0.9 mg/dL (ref 0.0–1.2)
Total Protein: 6.4 g/dL — ABNORMAL LOW (ref 6.5–8.1)

## 2024-09-18 LAB — TYPE AND SCREEN
ABO/RH(D): O POS
Antibody Screen: NEGATIVE

## 2024-09-18 LAB — CBC WITH DIFFERENTIAL/PLATELET
Abs Immature Granulocytes: 0.01 K/uL (ref 0.00–0.07)
Basophils Absolute: 0 K/uL (ref 0.0–0.1)
Basophils Relative: 1 %
Eosinophils Absolute: 0.1 K/uL (ref 0.0–0.5)
Eosinophils Relative: 1 %
HCT: 37.8 % — ABNORMAL LOW (ref 39.0–52.0)
Hemoglobin: 13.3 g/dL (ref 13.0–17.0)
Immature Granulocytes: 0 %
Lymphocytes Relative: 13 %
Lymphs Abs: 0.7 K/uL (ref 0.7–4.0)
MCH: 32.3 pg (ref 26.0–34.0)
MCHC: 35.2 g/dL (ref 30.0–36.0)
MCV: 91.7 fL (ref 80.0–100.0)
Monocytes Absolute: 0.3 K/uL (ref 0.1–1.0)
Monocytes Relative: 5 %
Neutro Abs: 3.9 K/uL (ref 1.7–7.7)
Neutrophils Relative %: 80 %
Platelets: 129 K/uL — ABNORMAL LOW (ref 150–400)
RBC: 4.12 MIL/uL — ABNORMAL LOW (ref 4.22–5.81)
RDW: 13.6 % (ref 11.5–15.5)
WBC: 4.9 K/uL (ref 4.0–10.5)
nRBC: 0 % (ref 0.0–0.2)

## 2024-09-18 LAB — CBC
HCT: 39.9 % (ref 39.0–52.0)
Hemoglobin: 13.5 g/dL (ref 13.0–17.0)
MCH: 31.3 pg (ref 26.0–34.0)
MCHC: 33.8 g/dL (ref 30.0–36.0)
MCV: 92.4 fL (ref 80.0–100.0)
Platelets: 148 K/uL — ABNORMAL LOW (ref 150–400)
RBC: 4.32 MIL/uL (ref 4.22–5.81)
RDW: 13.7 % (ref 11.5–15.5)
WBC: 7.6 K/uL (ref 4.0–10.5)
nRBC: 0 % (ref 0.0–0.2)

## 2024-09-18 LAB — ETHANOL: Alcohol, Ethyl (B): <15 mg/dL (ref <15)

## 2024-09-18 LAB — PROTIME-INR
INR: 1.1 (ref 0.8–1.2)
Prothrombin Time: 14.6 s (ref 11.4–15.2)

## 2024-09-18 LAB — HIV ANTIBODY (ROUTINE TESTING W REFLEX): HIV Screen 4th Generation wRfx: NONREACTIVE

## 2024-09-18 MED ORDER — GABAPENTIN 300 MG PO CAPS
600.0000 mg | ORAL_CAPSULE | Freq: Three times a day (TID) | ORAL | Status: DC
  Administered 2024-09-18 – 2024-09-22 (×11): 600 mg via ORAL
  Filled 2024-09-18 (×12): qty 2

## 2024-09-18 MED ORDER — FENTANYL CITRATE (PF) 100 MCG/2ML IJ SOLN
INTRAMUSCULAR | Status: AC
  Filled 2024-09-18: qty 2

## 2024-09-18 MED ORDER — HYDRALAZINE HCL 20 MG/ML IJ SOLN: 10.0000 mg | Freq: Four times a day (QID) | INTRAMUSCULAR | Status: DC | PRN

## 2024-09-18 MED ORDER — FENTANYL CITRATE (PF) 50 MCG/ML IJ SOSY
50.0000 ug | PREFILLED_SYRINGE | Freq: Once | INTRAMUSCULAR | Status: AC
  Administered 2024-09-18: 50 ug via INTRAVENOUS

## 2024-09-18 MED ORDER — MIDAZOLAM HCL 2 MG/2ML IJ SOLN
INTRAMUSCULAR | Status: AC
Start: 2024-09-18 — End: 2024-09-18
  Filled 2024-09-18: qty 2

## 2024-09-18 MED ORDER — MIDAZOLAM HCL (PF) 2 MG/2ML IJ SOLN
1.0000 mg | Freq: Once | INTRAMUSCULAR | Status: AC
  Administered 2024-09-18: 1 mg via INTRAVENOUS

## 2024-09-18 MED ORDER — MORPHINE SULFATE (PF) 4 MG/ML IV SOLN
4.0000 mg | Freq: Once | INTRAVENOUS | Status: AC
  Administered 2024-09-18: 4 mg via INTRAVENOUS
  Filled 2024-09-18: qty 1

## 2024-09-18 MED ORDER — METOPROLOL TARTRATE 5 MG/5ML IV SOLN
5.0000 mg | Freq: Three times a day (TID) | INTRAVENOUS | Status: DC | PRN
Start: 2024-09-18 — End: 2024-09-22

## 2024-09-18 MED ORDER — SERTRALINE HCL 100 MG PO TABS
100.0000 mg | ORAL_TABLET | Freq: Every day | ORAL | Status: DC
  Administered 2024-09-18 – 2024-09-22 (×5): 100 mg via ORAL
  Filled 2024-09-18 (×6): qty 1

## 2024-09-18 MED ORDER — LACTATED RINGERS IV SOLN: INTRAVENOUS | Status: AC

## 2024-09-18 MED ORDER — THIAMINE MONONITRATE 100 MG PO TABS
100.0000 mg | ORAL_TABLET | Freq: Every day | ORAL | Status: DC
  Administered 2024-09-18 – 2024-09-22 (×5): 100 mg via ORAL
  Filled 2024-09-18 (×5): qty 1

## 2024-09-18 MED ORDER — LORAZEPAM 2 MG/ML IJ SOLN
1.0000 mg | INTRAMUSCULAR | Status: AC | PRN
  Administered 2024-09-18: 2 mg via INTRAVENOUS
  Administered 2024-09-19: 1 mg via INTRAVENOUS
  Administered 2024-09-19: 2 mg via INTRAVENOUS
  Filled 2024-09-18 (×3): qty 1

## 2024-09-18 MED ORDER — ADULT MULTIVITAMIN W/MINERALS CH
1.0000 | ORAL_TABLET | Freq: Every day | ORAL | Status: DC
  Administered 2024-09-18 – 2024-09-22 (×5): 1 via ORAL
  Filled 2024-09-18 (×5): qty 1

## 2024-09-18 MED ORDER — MORPHINE SULFATE (PF) 2 MG/ML IV SOLN: 0.5000 mg | INTRAVENOUS | Status: DC | PRN

## 2024-09-18 MED ORDER — AMOXICILLIN-POT CLAVULANATE 875-125 MG PO TABS
1.0000 | ORAL_TABLET | Freq: Two times a day (BID) | ORAL | Status: DC
  Administered 2024-09-18 – 2024-09-22 (×8): 1 via ORAL
  Filled 2024-09-18 (×8): qty 1

## 2024-09-18 MED ORDER — BUPIVACAINE-EPINEPHRINE (PF) 0.5% -1:200000 IJ SOLN
INTRAMUSCULAR | Status: DC | PRN
  Administered 2024-09-18: 30 mL via PERINEURAL

## 2024-09-18 MED ORDER — HYDROCODONE-ACETAMINOPHEN 5-325 MG PO TABS: 1.0000 | ORAL_TABLET | Freq: Four times a day (QID) | ORAL | Status: DC | PRN

## 2024-09-18 MED ORDER — CHLORDIAZEPOXIDE HCL 5 MG PO CAPS
20.0000 mg | ORAL_CAPSULE | Freq: Three times a day (TID) | ORAL | Status: DC
  Administered 2024-09-18 – 2024-09-22 (×10): 20 mg via ORAL
  Filled 2024-09-18 (×13): qty 4

## 2024-09-18 MED ORDER — HYDROCODONE-ACETAMINOPHEN 5-325 MG PO TABS
1.0000 | ORAL_TABLET | ORAL | Status: DC | PRN
  Administered 2024-09-18: 2 via ORAL
  Administered 2024-09-19: 1 via ORAL
  Administered 2024-09-20 – 2024-09-22 (×8): 2 via ORAL
  Filled 2024-09-18 (×8): qty 2
  Filled 2024-09-18: qty 1
  Filled 2024-09-18: qty 2

## 2024-09-18 MED ORDER — ONDANSETRON HCL 4 MG/2ML IJ SOLN: 4.0000 mg | Freq: Four times a day (QID) | INTRAMUSCULAR | Status: DC | PRN

## 2024-09-18 MED ORDER — LORAZEPAM 1 MG PO TABS
1.0000 mg | ORAL_TABLET | ORAL | Status: AC | PRN
  Administered 2024-09-18: 2 mg via ORAL
  Filled 2024-09-18: qty 2

## 2024-09-18 MED ORDER — SENNOSIDES-DOCUSATE SODIUM 8.6-50 MG PO TABS: 1.0000 | ORAL_TABLET | Freq: Every evening | ORAL | Status: DC | PRN

## 2024-09-18 MED ORDER — ACETAMINOPHEN 325 MG PO TABS: 650.0000 mg | ORAL_TABLET | Freq: Four times a day (QID) | ORAL | Status: DC | PRN

## 2024-09-18 MED ORDER — MORPHINE SULFATE (PF) 2 MG/ML IV SOLN
2.0000 mg | INTRAVENOUS | Status: DC | PRN
  Administered 2024-09-19 – 2024-09-21 (×3): 2 mg via INTRAVENOUS
  Filled 2024-09-18 (×3): qty 1

## 2024-09-18 MED ORDER — CHOLECALCIFEROL 10 MCG (400 UNIT) PO TABS
400.0000 [IU] | ORAL_TABLET | Freq: Every day | ORAL | Status: DC
  Administered 2024-09-18 – 2024-09-20 (×3): 400 [IU] via ORAL
  Filled 2024-09-18 (×3): qty 1

## 2024-09-18 MED ORDER — HEPARIN SODIUM (PORCINE) 5000 UNIT/ML IJ SOLN
5000.0000 [IU] | Freq: Three times a day (TID) | INTRAMUSCULAR | Status: DC
  Administered 2024-09-18 – 2024-09-22 (×10): 5000 [IU] via SUBCUTANEOUS
  Filled 2024-09-18 (×11): qty 1

## 2024-09-18 MED ORDER — FOLIC ACID 1 MG PO TABS
1.0000 mg | ORAL_TABLET | Freq: Every day | ORAL | Status: DC
  Administered 2024-09-18 – 2024-09-22 (×5): 1 mg via ORAL
  Filled 2024-09-18 (×5): qty 1

## 2024-09-18 MED ORDER — THIAMINE HCL 100 MG/ML IJ SOLN
100.0000 mg | Freq: Every day | INTRAMUSCULAR | Status: DC
  Filled 2024-09-18 (×2): qty 2

## 2024-09-18 NOTE — ED Provider Notes (Signed)
 [place] EMERGENCY DEPARTMENT AT [facility] Provider Note   CSN: [id number] Arrival date & time: 09/18/24  1302     Patient presents with: Ryan Lynch is a 43 y.o. male.   Fall  Patient is a 43 year old male presenting ED today for concerns for left-sided hip/left thigh pain that happened acutely after mechanical fall where he slipped on fluid on the ground in the kitchen, pivoting to left side where he felt an audible pop in left hip and left leg falling onto left side.  Did not hit head, did not lose consciousness, not on blood thinners.  Has had decreased ROM to left leg at hip secondary to pain.  Has not been able to ambulate since.  Reports being diaphoretic secondary to left leg pain.  Last p.o. status was at 11 AM this morning, when he last ate breakfast.  Notably treated for sinobronchitis yesterday currently taking Augmentin with improvement of URI symptoms.  Denies alcohol use, drug use.   Denies numbness, weakness, tingling, headache, vision changes, chest pain, shortness breath, abdominal pain, nausea, vomiting, diarrhea, dysuria.  Prior to Admission medications   Medication Sig Start Date End Date Taking? Authorizing Provider  amoxicillin-clavulanate (AUGMENTIN) 875-125 MG tablet Take 1 tablet by mouth every 12 (twelve) hours for 7 days. 09/17/24 09/24/24  Rising, Rebecca, PA-C  amphetamine-dextroamphetamine (ADDERALL) 5 MG tablet Take 1 tablet by mouth 3 (three) times daily. Patient taking differently: Take 1 tablet by mouth as needed. 04/16/24   [provider]  gabapentin (NEURONTIN) 600 MG tablet Take 600 mg by mouth 3 (three) times daily. 03/16/23   [provider]  ibuprofen (ADVIL) 200 MG tablet Take 200 mg by mouth every 6 (six) hours as needed.    [provider]  promethazine -dextromethorphan (PROMETHAZINE -DM) 6.25-15 MG/5ML syrup Take 5 mLs by mouth 4 (four) times daily as needed for cough. 09/17/24   Rising, [place],  PA-C  Secukinumab  (COSENTYX  UNOREADY) 300 MG/2ML SOAJ Inject 300 mg into the skin every 28 (twenty-eight) days. 09/02/24   Jeannetta Lonni ORN, MD  sertraline  (ZOLOFT ) 100 MG tablet Take 1 tablet (100 mg total) by mouth daily. 07/22/22   Angiulli, Toribio PARAS, PA-C  VITAMIN D  PO Take by mouth.    [provider]    Allergies: Timothy grass pollen allergen    Review of Systems  Musculoskeletal:  Positive for arthralgias.  All other systems reviewed and are negative.   Updated Vital Signs BP (!) 145/105 (BP Location: Left Arm)   Pulse (!) 107   Temp 98.2 F (36.8 C) (Oral)   Resp 18   Ht 5' 10 (1.778 m)   Wt 77.1 kg   SpO2 99%   BMI 24.39 kg/m   Physical Exam Vitals and nursing note reviewed.  Constitutional:      General: He is in acute distress.     Appearance: He is diaphoretic. He is not ill-appearing.  HENT:     Head: Normocephalic and atraumatic.  Eyes:     General: No scleral icterus.       Right eye: No discharge.        Left eye: No discharge.     Extraocular Movements: Extraocular movements intact.     Conjunctiva/sclera: Conjunctivae normal.     Pupils: Pupils are equal, round, and reactive to light.  Cardiovascular:     Rate and Rhythm: Regular rhythm. Tachycardia present.     Pulses: Normal pulses.     Heart sounds: Normal  heart sounds. No murmur heard.    No friction rub. No gallop.  Pulmonary:     Effort: Pulmonary effort is normal. No respiratory distress.     Breath sounds: No stridor. No wheezing, rhonchi or rales.  Chest:     Chest wall: No tenderness.  Abdominal:     General: Abdomen is flat. There is no distension.     Palpations: Abdomen is soft.     Tenderness: There is no abdominal tenderness. There is no right CVA tenderness, left CVA tenderness, guarding or rebound.  Musculoskeletal:        General: Tenderness (Notably has tenderness and deformity to left lateral thigh.) and deformity present. No swelling or signs of injury.      Cervical back: Normal range of motion. No rigidity.     Right lower leg: No edema.     Left lower leg: No edema.     Comments: Full range of motion at knee, ankle, feet.  Skin:    General: Skin is warm.     Capillary Refill: Capillary refill takes less than 2 seconds.     Findings: No bruising, erythema or lesion.     Comments: DP pulse 2+ bilaterally  Neurological:     General: No focal deficit present.     Mental Status: He is alert and oriented to person, place, and time. Mental status is at baseline.     Sensory: No sensory deficit.     Motor: No weakness.  Psychiatric:        Mood and Affect: Mood normal.     (all labs ordered are listed, but only abnormal results are displayed) Labs Reviewed  CBC WITH DIFFERENTIAL/PLATELET - Abnormal; Notable for the following components:      Result Value   RBC 4.12 (*)    HCT 37.8 (*)    Platelets 129 (*)    All other components within normal limits  BASIC METABOLIC PANEL WITH GFR - Abnormal; Notable for the following components:   Glucose, Bld 135 (*)    BUN 5 (*)    All other components within normal limits  URINALYSIS, ROUTINE W REFLEX MICROSCOPIC  RAPID URINE DRUG SCREEN, HOSP PERFORMED  ETHANOL  PROTIME-INR  TYPE AND SCREEN    EKG: None  Radiology: DG Pelvis 1-2 Views Result Date: 09/18/2024 EXAM: 1 or 2 VIEW(S) XRAY OF THE PELVIS 09/18/2024 02:23:00 PM COMPARISON: CT 07/10/2022. CLINICAL HISTORY: 809823 Fall 190176. FINDINGS: BONES AND JOINTS: Acute comminuted left femoral intertrochanteric fracture with varus angulation. Mildly displaced lesser trochanter fracture fragment. No joint dislocation. SOFT TISSUES: The soft tissues are unremarkable. IMPRESSION: 1. Acute comminuted left femoral intertrochanteric fracture with varus angulation and a mildly displaced lesser trochanter fracture fragment. Electronically signed by: Dayne Hassell MD 09/18/2024 02:50 PM EST RP Workstation: [id number]   DG Femur Min 2 Views Left Result  Date: 09/18/2024 EXAM: 2 VIEW(S) XRAY OF THE LEFT FEMUR 09/18/2024 02:23:00 PM COMPARISON: CT 07/10/2022. CLINICAL HISTORY: Mechanical fall with pivot to L side, heard an audible pop, severe L sided Hip pain. FINDINGS: BONES AND JOINTS: Acute comminuted displaced and impacted intertrochanteric fracture of the proximal femur. Laterally displaced distal fragment. Displaced lesser trochanter fracture fragment. Remote hardware defects in the mid femoral shaft. Partially visualized plate and screw fixation of the lateral tibial plateau in place. No joint dislocation. SOFT TISSUES: The soft tissues are unremarkable. IMPRESSION: 1. Acute comminuted displaced and impacted intertrochanteric fracture of the proximal femur with laterally displaced distal fragment and displaced lesser  trochanter fracture fragment. Electronically signed by: Dayne Hassell MD 09/18/2024 02:49 PM EST RP Workstation: [id number]   DG Chest 2 View Result Date: 09/17/2024 EXAM: 2 VIEW(S) XRAY OF THE CHEST 09/17/2024 09:58:22 AM COMPARISON: Chest CTA 07/10/2022 and earlier. CLINICAL HISTORY: productive cough, 2 weeks FINDINGS: LUNGS AND PLEURA: No focal pulmonary opacity. No pulmonary edema. No pleural effusion. No pneumothorax. HEART AND MEDIASTINUM: No acute abnormality of the cardiac and mediastinal silhouettes. BONES AND SOFT TISSUES: Old healed proximal right humeral fracture. IMPRESSION: 1. No acute cardiopulmonary process. Electronically signed by: Helayne Hurst MD 09/17/2024 10:47 AM EST RP Workstation: [id number]   Procedures   Medications Ordered in the ED  morphine  (PF) 4 MG/ML injection 4 mg (4 mg Intravenous Given 09/18/24 1350)  morphine  (PF) 4 MG/ML injection 4 mg (4 mg Intravenous Given 09/18/24 1530)    Clinical Course as of 09/18/24 1553  Wed Sep 18, 2024  1517 Attending, Dr. Ginger spoke with Ozell Purchase, PA-C with orthopedics, concerning this patient reporting that he was to be admitted to medicine.  Did not provide  information as whether he should be n.p.o. at this time. [CB]  1547 Spoke with Dr. Dennise with hospitalist, who said patient care this time, requesting UA, EKG, ethanol and UDS. [CB]    Clinical Course User Index [CB] Beola Terrall RAMAN, PA-C   Medical Decision Making Amount and/or Complexity of Data Reviewed Labs: ordered. Radiology: ordered. ECG/medicine tests: ordered.  Risk Prescription drug management. Decision regarding hospitalization.   This patient is a 43 year old male who presents to the ED for concern of left leg pain after mechanical fall after slipping on fluid in the kitchen landing on left side, twisting and hearing an audible pop.  On physical exam, patient is in no acute distress, afebrile, alert and orient x 4, speaking in full sentences, nontachypneic.  Notably is diaphoretic which he reports secondary to the pain.  Notably has mild tachycardia of low 100s as well as visible deformity to left lateral thigh, with tenderness locally.  No injuries noted to left knee, left ankle with him being neurovascular intact with no decrease sensation and DP pulse 2+.  No other injuries noted otherwise to back, head.  Patient is denying chest pain, shortness breath, abdominal pain, nausea, vomiting, diarrhea, dysuria.  Lab work is near patient's baseline, x-ray showing intertrochanteric and lesser trochanter fractures that are both displaced.  Spoke with Michael Jeffrey with orthopedics who recommended patient be admitted to medicine and made n.p.o. at tonight with surgery likely scheduled for tomorrow.  Spoke with Dr. Dennise with hospitalist, who requested UDS, ethanol, UA and ECG with his abnormal fracture.  Suspecting that he may still be drinking.  Patient care admitted Dr. Dennise at this time.  Differential diagnoses prior to evaluation: The emergent differential diagnosis includes, but is not limited to, fracture, ligamentous injury, neurovascular injury, dislocation,  malalignment, compartment syndrome, limb ischemia. This is not an exhaustive differential.   Past Medical History / Co-morbidities / Social History: Psoriasis, anemia, Psoriatec arthritis, AKI, alcohol withdrawal  Additional history: Chart reviewed. Pertinent results include:   Seen on 09/17/2024 for URI symptoms, with additionally being seen on 09/03/2024 by [facility] urgent care.  Prescribed Augmentin, promethazine  DM.  Diagnosed with sinobronchitis  Lab Tests/Imaging studies: I personally interpreted labs/imaging and the pertinent results include:    CBC shows thrombocytopenia of 129 With him being previously lower 9 months ago but had normal platelets 4 months ago BMP notes a decreased BUN of 5 but otherwise  unremarkable X-ray of left hip and left femur shows acute comminuted left moral intertrochanteric fracture with varus angulation and mildly displaced left trochanteric fracture fragment  I agree with the radiologist interpretation.    Medications: I ordered medication including morphine .  I have reviewed the patients home medicines and have made adjustments as needed.  Critical Interventions: None  Consultations: Orthopedics, spoke with Ozell Purchase, PA-C who requested that he be admitted to medicine.  Social Determinants of Health: history of alcohol abuse.  Disposition: After consideration of the diagnostic results and the patients response to treatment, I feel that the patient would benefit from admission, patient care admitted to Dr. Dennise, with patient likely to undergo orthopedic surgery tomorrow.   Final diagnoses:  Closed displaced intertrochanteric fracture of left femur, initial encounter ([name])  Closed displaced fracture of lesser trochanter of left femur, initial encounter [facility])  Fall, initial encounter    ED Discharge Orders     None          Beola Terrall RAMAN, [place] 09/18/24 1554    Tegeler, Lonni PARAS, MD 09/18/24 1655

## 2024-09-18 NOTE — Consult Note (Signed)
 Reason for Consult:Hip fx Referring Physician: Medford Tegeler Time called: 1511 Time at bedside: 1522   Ryan Lynch is an 43 y.o. male.  HPI: Karis slipped on some water at home and fell. He had immediate left hip pain and could not get up. He was brought to the ED where x-rays showed a left hip fx and orthopedic surgery was consulted. He works for the [employer] and does some other computer based work in [employer].  Past Medical History:  Diagnosis Date   Acute anemia 06/2022   Acute metabolic encephalopathy 06/2022   Acute urinary retention 06/2022   ADD (attention deficit disorder)    Adrenal nodule 06/2022   AKI (acute kidney injury) 06/2022   Broken arm 09/2023   Broken humerus    Closed nondisplaced fracture of left tibial plateau 07/2022   Multiple rib fractures 06/2022   Psoriasis    Psoriatic arthritis ([name])    Thrombocytopenia 06/2022   Traumatic rhabdomyolysis 06/2022    Past Surgical History:  Procedure Laterality Date   ANTERIOR CRUCIATE LIGAMENT REPAIR Right    ANTERIOR CRUCIATE LIGAMENT REPAIR Right    APPLICATION OF WOUND VAC Left 07/02/2022   Procedure: APPLICATION OF WOUND VAC;  Surgeon: Celena Sharper, MD;  Location: [facility] OR;  Service: Orthopedics;  Laterality: Left;   APPLICATION OF WOUND VAC Left 07/05/2022   Procedure: APPLICATION OF WOUND VAC;  Surgeon: Celena Sharper, MD;  Location: [facility] OR;  Service: Orthopedics;  Laterality: Left;   CLOSED REDUCTION TIBIA Left 07/02/2022   Procedure: CLOSED REDUCTION TIBIA;  Surgeon: Celena Sharper, MD;  Location: [facility] OR;  Service: Orthopedics;  Laterality: Left;   EXTERNAL FIXATION LEG Left 07/02/2022   Procedure: EXTERNAL FIXATION LEG;  Surgeon: Celena Sharper, MD;  Location: [facility] OR;  Service: Orthopedics;  Laterality: Left;   FASCIOTOMY Left 07/02/2022   Procedure: FASCIOTOMY;  Surgeon: Celena Sharper, MD;  Location: [facility] OR;  Service: Orthopedics;  Laterality: Left;   I & D EXTREMITY Left 07/07/2022    Procedure: IRRIGATION AND DEBRIDEMENT EXTREMITY;  Surgeon: Celena Sharper, MD;  Location: [facility] OR;  Service: Orthopedics;  Laterality: Left;   ORIF TIBIA PLATEAU Left 07/05/2022   Procedure: OPEN REDUCTION INTERNAL FIXATION (ORIF) TIBIAL PLATEAU;  Surgeon: Celena Sharper, MD;  Location: [facility] OR;  Service: Orthopedics;  Laterality: Left;   SECONDARY CLOSURE OF WOUND Left 07/05/2022   Procedure: SECONDARY CLOSURE OF WOUND;  Surgeon: Celena Sharper, MD;  Location: [facility] OR;  Service: Orthopedics;  Laterality: Left;   SECONDARY CLOSURE OF WOUND Left 07/07/2022   Procedure: SECONDARY CLOSURE OF WOUND;  Surgeon: Celena Sharper, MD;  Location: [facility] OR;  Service: Orthopedics;  Laterality: Left;   SHOULDER SURGERY Bilateral    WISDOM TOOTH EXTRACTION  05/23/2023    Family History  Problem Relation Age of Onset   Osteoporosis Mother    Arthritis Father     Social History:  reports that he has quit smoking. His smoking use included cigarettes. He has been exposed to tobacco smoke. He has never used smokeless tobacco. He reports that he does not currently use alcohol. He reports that he does not use drugs.  Allergies:  Allergies  Allergen Reactions   Timothy Grass Pollen Allergen     Medications: I have reviewed the patient's current medications.  Results for orders placed or performed during the hospital encounter of 09/18/24 (from the past 48 hours)  CBC with Differential     Status: Abnormal   Collection Time: 09/18/24  1:50 PM  Result Value Ref Range   WBC 4.9 4.0 - 10.5 K/uL   RBC 4.12 (L) 4.22 - 5.81 MIL/uL   Hemoglobin 13.3 13.0 - 17.0 g/dL   HCT 62.1 (L) 60.9 - 47.9 %   MCV 91.7 80.0 - 100.0 fL   MCH 32.3 26.0 - 34.0 pg   MCHC 35.2 30.0 - 36.0 g/dL   RDW 86.3 88.4 - 84.4 %   Platelets 129 (L) 150 - 400 K/uL   nRBC 0.0 0.0 - 0.2 %   Neutrophils Relative % 80 %   Neutro Abs 3.9 1.7 - 7.7 K/uL   Lymphocytes Relative 13 %   Lymphs Abs 0.7 0.7 - 4.0 K/uL   Monocytes Relative 5 %    Monocytes Absolute 0.3 0.1 - 1.0 K/uL   Eosinophils Relative 1 %   Eosinophils Absolute 0.1 0.0 - 0.5 K/uL   Basophils Relative 1 %   Basophils Absolute 0.0 0.0 - 0.1 K/uL   Immature Granulocytes 0 %   Abs Immature Granulocytes 0.01 0.00 - 0.07 K/uL    Comment: Performed at [facility], [address], [place], [place] [address]  Basic metabolic panel     Status: Abnormal   Collection Time: 09/18/24  1:50 PM  Result Value Ref Range   Sodium 140 135 - 145 mmol/L   Potassium 4.2 3.5 - 5.1 mmol/L   Chloride 104 98 - 111 mmol/L   CO2 23 22 - 32 mmol/L   Glucose, Bld 135 (H) 70 - 99 mg/dL    Comment: Glucose reference range applies only to samples taken after fasting for at least 8 hours.   BUN 5 (L) 6 - 20 mg/dL   Creatinine, Ser 9.06 0.61 - 1.24 mg/dL   Calcium 8.9 8.9 - 89.6 mg/dL   GFR, Estimated >39 >39 mL/min    Comment: (NOTE) Calculated using the CKD-EPI Creatinine Equation (2021)    Anion gap 13 5 - 15    Comment: Performed at [facility], [address] [place], [place] [address]    DG Pelvis 1-2 Views Result Date: 09/18/2024 EXAM: 1 or 2 VIEW(S) XRAY OF THE PELVIS 09/18/2024 02:23:00 PM COMPARISON: CT 07/10/2022. CLINICAL HISTORY: 809823 Fall 190176. FINDINGS: BONES AND JOINTS: Acute comminuted left femoral intertrochanteric fracture with varus angulation. Mildly displaced lesser trochanter fracture fragment. No joint dislocation. SOFT TISSUES: The soft tissues are unremarkable. IMPRESSION: 1. Acute comminuted left femoral intertrochanteric fracture with varus angulation and a mildly displaced lesser trochanter fracture fragment. Electronically signed by: Dayne Hassell MD 09/18/2024 02:50 PM EST RP Workstation: [id number]   DG Femur Min 2 Views Left Result Date: 09/18/2024 EXAM: 2 VIEW(S) XRAY OF THE LEFT FEMUR 09/18/2024 02:23:00 PM COMPARISON: CT 07/10/2022. CLINICAL HISTORY: Mechanical fall with pivot to L side, heard an audible pop, severe L sided Hip pain.  FINDINGS: BONES AND JOINTS: Acute comminuted displaced and impacted intertrochanteric fracture of the proximal femur. Laterally displaced distal fragment. Displaced lesser trochanter fracture fragment. Remote hardware defects in the mid femoral shaft. Partially visualized plate and screw fixation of the lateral tibial plateau in place. No joint dislocation. SOFT TISSUES: The soft tissues are unremarkable. IMPRESSION: 1. Acute comminuted displaced and impacted intertrochanteric fracture of the proximal femur with laterally displaced distal fragment and displaced lesser trochanter fracture fragment. Electronically signed by: Dayne Hassell MD 09/18/2024 02:49 PM EST RP Workstation: [id number]   DG Chest 2 View Result Date: 09/17/2024 EXAM: 2 VIEW(S) XRAY OF THE CHEST 09/17/2024 09:58:22 AM COMPARISON: Chest  CTA 07/10/2022 and earlier. CLINICAL HISTORY: productive cough, 2 weeks FINDINGS: LUNGS AND PLEURA: No focal pulmonary opacity. No pulmonary edema. No pleural effusion. No pneumothorax. HEART AND MEDIASTINUM: No acute abnormality of the cardiac and mediastinal silhouettes. BONES AND SOFT TISSUES: Old healed proximal right humeral fracture. IMPRESSION: 1. No acute cardiopulmonary process. Electronically signed by: Helayne Hurst MD 09/17/2024 10:47 AM EST RP Workstation: [id number]    Review of Systems  HENT:  Negative for ear discharge, ear pain, hearing loss and tinnitus.   Eyes:  Negative for photophobia and pain.  Respiratory:  Negative for cough and shortness of breath.   Cardiovascular:  Negative for chest pain.  Gastrointestinal:  Negative for abdominal pain, nausea and vomiting.  Genitourinary:  Negative for dysuria, flank pain, frequency and urgency.  Musculoskeletal:  Positive for arthralgias (Left hip). Negative for back pain, myalgias and neck pain.  Neurological:  Negative for dizziness and headaches.  Hematological:  Does not bruise/bleed easily.  Psychiatric/Behavioral:  The patient is not  nervous/anxious.    Blood pressure (!) 145/105, pulse (!) 107, temperature 98.2 F (36.8 C), temperature source Oral, resp. rate 18, height 5' 10 (1.778 m), weight 77.1 kg, SpO2 99%. Physical Exam Constitutional:      General: He is not in acute distress.    Appearance: He is well-developed. He is diaphoretic.  HENT:     Head: Normocephalic and atraumatic.  Eyes:     General: No scleral icterus.       Right eye: No discharge.        Left eye: No discharge.     Conjunctiva/sclera: Conjunctivae normal.  Cardiovascular:     Rate and Rhythm: Normal rate and regular rhythm.  Pulmonary:     Effort: Pulmonary effort is normal. No respiratory distress.  Musculoskeletal:     Cervical back: Normal range of motion.     Comments: LLE No traumatic wounds, ecchymosis, or rash  Mod TTP hip  No knee or ankle effusion  Knee stable to varus/ valgus and anterior/posterior stress  Sens DPN, SPN, TN intact  Motor EHL, ext, flex, evers 5/5  DP 2+, PT 2+, No significant edema  Skin:    General: Skin is warm.  Neurological:     Mental Status: He is alert.  Psychiatric:        Mood and Affect: Mood normal.        Behavior: Behavior normal.     Assessment/Plan: Left hip fx -- Plan IMN, likely tomorrow. Please keep NPO after MN.    Ozell DOROTHA Ned, PA-C Orthopedic Surgery [phone] 09/18/2024, 3:34 PM

## 2024-09-18 NOTE — Anesthesia Preprocedure Evaluation (Addendum)
 Anesthesia Evaluation  Patient identified by MRN, date of birth, ID band Patient awake    Reviewed: Allergy & Precautions, NPO status , Patient's Chart, lab work & pertinent test results  History of Anesthesia Complications Negative for: history of anesthetic complications  Airway Mallampati: II  TM Distance: >3 FB Neck ROM: Full    Dental  (+) Dental Advisory Given   Pulmonary Recent URI , Resolved, former smoker   breath sounds clear to auscultation       Cardiovascular negative cardio ROS  Rhythm:Regular Rate:Tachycardia     Neuro/Psych  PSYCHIATRIC DISORDERS (ADHD) Anxiety     negative neurological ROS     GI/Hepatic negative GI ROS,,,(+)     substance abuse (remote hx ETOH abuse)    Endo/Other  negative endocrine ROS    Renal/GU negative Renal ROS     Musculoskeletal  (+) Arthritis ,    Abdominal   Peds  Hematology Hb 13.3, plt 129k   Anesthesia Other Findings mechanical fall at home where he claims that he slipped on wet floor and hit his left hip on the floor: intertrochanteric hip fracture  Reproductive/Obstetrics                              Anesthesia Physical Anesthesia Plan  ASA: 2  Anesthesia Plan:    Post-op Pain Management: Regional block*   Induction:   PONV Risk Score and Plan:   Airway Management Planned:   Additional Equipment:   Intra-op Plan:   Post-operative Plan:   Informed Consent: I have reviewed the patients History and Physical, chart, labs and discussed the procedure including the risks, benefits and alternatives for the proposed anesthesia with the patient or authorized representative who has indicated his/her understanding and acceptance.     Dental advisory given  Plan Discussed with:   Anesthesia Plan Comments: (Plan routine monitors, femoral nerve block for analgesia, pending IM nail tomorrow)        Anesthesia Quick  Evaluation

## 2024-09-18 NOTE — ED Notes (Signed)
 Pt in bed, pt L leg is slightly shorter, pt has deformity at his hip, pt reports tenderness to palpation, pt has strong pedal pulses, resps even and unlabored, less than three sec cap refill, positive sensation to touch.

## 2024-09-18 NOTE — ED Triage Notes (Signed)
 Pt to er via ems, per ems pt slipped in the living room and fell onto his L hip, states that he heard a snap and a pop.  Pt now c/o L hip pain.  States that they started and iv in his L ac and gave 100mcg of fentanyl .

## 2024-09-18 NOTE — Progress Notes (Signed)
 Transition of Care [facility]) - CAGE-AID Screening   Patient Details  Name: Ryan Lynch MRN: [id number] Date of Birth: [birth year]  Transition of Care [employer] [employer]) CM/SW Contact:    Bernardino Mayotte, RN Phone Number: 09/18/2024, 10:17 PM   Clinical Narrative:  Patient endorses occasional alcohol use, denies illicit drugs. Resources not given at this time.  CAGE-AID Screening:    Have You Ever Felt You Ought to Cut Down on Your Drinking or Drug Use?: No Have People Annoyed You By Critizing Your Drinking Or Drug Use?: No Have You Felt Bad Or Guilty About Your Drinking Or Drug Use?: No Have You Ever Had a Drink or Used Drugs First Thing In The Morning to Steady Your Nerves or to Get Rid of a Hangover?: No CAGE-AID Score: 0  Substance Abuse Education Offered: No

## 2024-09-18 NOTE — H&P (Signed)
 [facility] H&P   Patient Demographics:    Ryan Lynch, is a 43 y.o. male  MRN: [id number]   DOB - May 03, 1981  Admit Date - 09/18/2024  Outpatient Primary MD for the patient is Patient, No Pcp Per  Patient coming from: Home  Chief Complaint  Patient presents with   Fall      HPI:    Ryan Lynch  is a 43 y.o. male, with remote history of alcohol abuse, ADD, chronic thrombocytopenia, psoriasis, who presents to the hospital after a mechanical fall at home where he claims that he slipped on wet floor and hit his left hip on the floor, subsequently had hip pain came to the ER where he was found to have left hip fracture.  Orthopedics was consulted who requested morning surgery and admission by [facility].   Patient is currently accompanied by his dad who sitting bedside, he knows the patient very well, he denies any headache, no fever or chills, he did have a URI due to a sore throat which was diagnosed 10 days ago upon no improvement he was started on Augmentin yesterday after which patient is feeling a whole lot better, no cough or shortness of breath, no chest pain or palpitations, no abdominal pain or discomfort, no blood in stool or urine.  No focal weakness  Patient was specifically asked about his alcohol intake, him and his father about adamant that he drinks once or twice a month only.  He denies any dizziness during his fall, denies being under the effect of any alcohol or drugs, says he slipped on wet floor.  Besides ongoing sharp left hip pain he has no other subjective complaints, pain is worse with movement better with rest and pain medications.  No radiation.  No other associated symptoms.    Review of systems:    A full  10 point Review of Systems was done, except as stated above, all other Review of Systems were negative.   With Past History of the following :    Past Medical History:  Diagnosis Date   Acute anemia 06/2022   Acute metabolic encephalopathy 06/2022   Acute urinary retention 06/2022   ADD (attention deficit disorder)    Adrenal nodule 06/2022   AKI (acute kidney injury) 06/2022   Broken arm 09/2023   Broken humerus  Closed nondisplaced fracture of left tibial plateau 07/2022   Multiple rib fractures 06/2022   Psoriasis    Psoriatic arthritis ([name])    Thrombocytopenia 06/2022   Traumatic rhabdomyolysis 06/2022      Past Surgical History:  Procedure Laterality Date   ANTERIOR CRUCIATE LIGAMENT REPAIR Right    ANTERIOR CRUCIATE LIGAMENT REPAIR Right    APPLICATION OF WOUND VAC Left 07/02/2022   Procedure: APPLICATION OF WOUND VAC;  Surgeon: Celena Sharper, MD;  Location: [facility] OR;  Service: Orthopedics;  Laterality: Left;   APPLICATION OF WOUND VAC Left 07/05/2022   Procedure: APPLICATION OF WOUND VAC;  Surgeon: Celena Sharper, MD;  Location: [facility] OR;  Service: Orthopedics;  Laterality: Left;   CLOSED REDUCTION TIBIA Left 07/02/2022   Procedure: CLOSED REDUCTION TIBIA;  Surgeon: Celena Sharper, MD;  Location: [facility] OR;  Service: Orthopedics;  Laterality: Left;   EXTERNAL FIXATION LEG Left 07/02/2022   Procedure: EXTERNAL FIXATION LEG;  Surgeon: Celena Sharper, MD;  Location: [facility] OR;  Service: Orthopedics;  Laterality: Left;   FASCIOTOMY Left 07/02/2022   Procedure: FASCIOTOMY;  Surgeon: Celena Sharper, MD;  Location: [facility] OR;  Service: Orthopedics;  Laterality: Left;   I & D EXTREMITY Left 07/07/2022   Procedure: IRRIGATION AND DEBRIDEMENT EXTREMITY;  Surgeon: Celena Sharper, MD;  Location: [facility] OR;  Service: Orthopedics;  Laterality: Left;   ORIF TIBIA PLATEAU Left 07/05/2022   Procedure: OPEN REDUCTION INTERNAL FIXATION (ORIF) TIBIAL PLATEAU;  Surgeon: Celena Sharper, MD;  Location: [facility] OR;   Service: Orthopedics;  Laterality: Left;   SECONDARY CLOSURE OF WOUND Left 07/05/2022   Procedure: SECONDARY CLOSURE OF WOUND;  Surgeon: Celena Sharper, MD;  Location: [facility] OR;  Service: Orthopedics;  Laterality: Left;   SECONDARY CLOSURE OF WOUND Left 07/07/2022   Procedure: SECONDARY CLOSURE OF WOUND;  Surgeon: Celena Sharper, MD;  Location: [facility] OR;  Service: Orthopedics;  Laterality: Left;   SHOULDER SURGERY Bilateral    WISDOM TOOTH EXTRACTION  05/23/2023      Social History:     Social History   Tobacco Use   Smoking status: Former    Types: Cigarettes    Passive exposure: Past   Smokeless tobacco: Never  Substance Use Topics   Alcohol use: Not Currently    Comment: special occassions        Family History :     Family History  Problem Relation Age of Onset   Osteoporosis Mother    Arthritis Father       Home Medications:   Prior to Admission medications   Medication Sig Start Date End Date Taking? Authorizing Provider  amoxicillin-clavulanate (AUGMENTIN) 875-125 MG tablet Take 1 tablet by mouth every 12 (twelve) hours for 7 days. 09/17/24 09/24/24  Rising, Rebecca, PA-C  amphetamine-dextroamphetamine (ADDERALL) 5 MG tablet Take 1 tablet by mouth 3 (three) times daily. Patient taking differently: Take 1 tablet by mouth as needed. 04/16/24   [provider]  gabapentin (NEURONTIN) 600 MG tablet Take 600 mg by mouth 3 (three) times daily. 03/16/23   [provider]  ibuprofen (ADVIL) 200 MG tablet Take 200 mg by mouth every 6 (six) hours as needed.    [provider]  promethazine -dextromethorphan (PROMETHAZINE -DM) 6.25-15 MG/5ML syrup Take 5 mLs by mouth 4 (four) times daily as needed for cough. 09/17/24   Rising, Asberry, PA-C  Secukinumab  (COSENTYX  UNOREADY) 300 MG/2ML SOAJ Inject 300 mg into the skin every 28 (twenty-eight) days. 09/02/24   Jeannetta Lonni ORN, MD  sertraline  (ZOLOFT ) 100 MG  tablet Take 1 tablet (100 mg total) by mouth daily.  07/22/22   Angiulli, Toribio PARAS, PA-C  VITAMIN D  PO Take by mouth.    [provider]     Allergies:     Allergies  Allergen Reactions   Timothy Grass Pollen Allergen      Physical Exam:   Vitals  Blood pressure (!) 145/105, pulse (!) 107, temperature 98.2 F (36.8 C), temperature source Oral, resp. rate 18, height 5' 10 (1.778 m), weight 77.1 kg, SpO2 99%.   1. General Middle-aged Caucasian male lying in hospital bed in mild distress due to hip pain,  2. Normal affect and insight, Not Suicidal or Homicidal, Awake Alert,   3. No F.N deficits, ALL C.Nerves Intact, Strength 5/5 all 4 extremities, Sensation intact all 4 extremities, Plantars down going.  4. Ears and Eyes appear Normal, Conjunctivae clear, PERRLA. Moist Oral Mucosa.  5. Supple Neck, No JVD, No cervical lymphadenopathy appriciated, No Carotid Bruits.  6. Symmetrical Chest wall movement, Good air movement bilaterally, CTAB.  7. RRR, No Gallops, Rubs or Murmurs, No Parasternal Heave.  8. Positive Bowel Sounds, Abdomen Soft, No tenderness, No organomegaly appriciated,No rebound -guarding or rigidity.  9.  No Cyanosis, Normal Skin Turgor, No Skin Rash or Bruise.  10. Good muscle tone,  joints appear normal , no effusions, Normal ROM.  Left leg is internally rotated and shortened, left hip tender to palpate  11. No Palpable Lymph Nodes in Neck or Axillae      Data Review:   Recent Labs  Lab 09/18/24 1350  WBC 4.9  HGB 13.3  HCT 37.8*  PLT 129*  MCV 91.7  MCH 32.3  MCHC 35.2  RDW 13.6  LYMPHSABS 0.7  MONOABS 0.3  EOSABS 0.1  BASOSABS 0.0    Recent Labs  Lab 09/18/24 1350  NA 140  K 4.2  CL 104  CO2 23  ANIONGAP 13  GLUCOSE 135*  BUN 5*  CREATININE 0.93  CALCIUM 8.9    Lab Results  Component Value Date   TRIG 151 (H) 07/14/2022    Recent Labs  Lab 09/18/24 1350  CALCIUM 8.9    Recent Labs  Lab 09/18/24 1350  WBC 4.9  PLT 129*  CREATININE 0.93    Urinalysis     Component Value Date/Time   COLORURINE AMBER (A) 07/07/2022 1756   APPEARANCEUR CLEAR 07/07/2022 1756   LABSPEC 1.016 07/07/2022 1756   PHURINE 7.0 07/07/2022 1756   GLUCOSEU NEGATIVE 07/07/2022 1756   HGBUR SMALL (A) 07/07/2022 1756   BILIRUBINUR NEGATIVE 07/07/2022 1756   KETONESUR 20 (A) 07/07/2022 1756   PROTEINUR NEGATIVE 07/07/2022 1756   NITRITE NEGATIVE 07/07/2022 1756   LEUKOCYTESUR NEGATIVE 07/07/2022 1756      Imaging Results:    DG Pelvis 1-2 Views Result Date: 09/18/2024 EXAM: 1 or 2 VIEW(S) XRAY OF THE PELVIS 09/18/2024 02:23:00 PM COMPARISON: CT 07/10/2022. CLINICAL HISTORY: 809823 Fall 190176. FINDINGS: BONES AND JOINTS: Acute comminuted left femoral intertrochanteric fracture with varus angulation. Mildly displaced lesser trochanter fracture fragment. No joint dislocation. SOFT TISSUES: The soft tissues are unremarkable. IMPRESSION: 1. Acute comminuted left femoral intertrochanteric fracture with varus angulation and a mildly displaced lesser trochanter fracture fragment. Electronically signed by: Dayne Hassell MD 09/18/2024 02:50 PM EST RP Workstation: [id number]   DG Femur Min 2 Views Left Result Date: 09/18/2024 EXAM: 2 VIEW(S) XRAY OF THE LEFT FEMUR 09/18/2024 02:23:00 PM COMPARISON: CT 07/10/2022. CLINICAL HISTORY: Mechanical fall with pivot to L side, heard  an audible pop, severe L sided Hip pain. FINDINGS: BONES AND JOINTS: Acute comminuted displaced and impacted intertrochanteric fracture of the proximal femur. Laterally displaced distal fragment. Displaced lesser trochanter fracture fragment. Remote hardware defects in the mid femoral shaft. Partially visualized plate and screw fixation of the lateral tibial plateau in place. No joint dislocation. SOFT TISSUES: The soft tissues are unremarkable. IMPRESSION: 1. Acute comminuted displaced and impacted intertrochanteric fracture of the proximal femur with laterally displaced distal fragment and displaced lesser  trochanter fracture fragment. Electronically signed by: Dayne Hassell MD 09/18/2024 02:49 PM EST RP Workstation: [id number]   DG Chest 2 View Result Date: 09/17/2024 EXAM: 2 VIEW(S) XRAY OF THE CHEST 09/17/2024 09:58:22 AM COMPARISON: Chest CTA 07/10/2022 and earlier. CLINICAL HISTORY: productive cough, 2 weeks FINDINGS: LUNGS AND PLEURA: No focal pulmonary opacity. No pulmonary edema. No pleural effusion. No pneumothorax. HEART AND MEDIASTINUM: No acute abnormality of the cardiac and mediastinal silhouettes. BONES AND SOFT TISSUES: Old healed proximal right humeral fracture. IMPRESSION: 1. No acute cardiopulmonary process. Electronically signed by: Helayne Hurst MD 09/17/2024 10:47 AM EST RP Workstation: [id number]    Baseline EKG ordered   Assessment & Plan:   Mechanical fall causing  Acute comminuted displaced and impacted intertrochanteric fracture of the proximal femur with laterally displaced distal fragment and displaced lesser trochanter fracture fragment - he confirms that he fell due to stepping on wet floor, no known heart issues, can climb 3-4 flights of stairs without any problems on a good day, did have a mild URI with sore throat which is much better after Augmentin was started yesterday which will be continued for a total of 5 more days.  Chest x-ray stable, baseline EKG being obtained.  Patient will proceed with surgery tomorrow pending no overnight complications, kindly follow-up on EKG which has been ordered, INR, type screen, agreeable for blood transfusion if needed during surgery, PT OT and weightbearing postsurgery will be deferred to orthopedics.  Pain control per orthopedics.  N.p.o. after midnight with gentle IV fluids.   Recent URI/sore throat diagnosed 10 days ago.  Was started on Augmentin yesterday complete 5-day course, chest x-ray clear, no cough or shortness of breath.  Afebrile.  Remote history of alcohol abuse, thrombocytopenia.  Patient and father both claimed that  he does not drink more than 2-3 times in a month, last drink 4 days ago, they again and again deny that he drinks regularly, they were told that in case they are not providing adequate history he might go in severe DTs but they claim that he does not drink more than 2-3 times a month.  At this time we will check LFTs, check a urine drug screen and serum alcohol level.  Monitor on telemetry overnight.  Postdischarge PCP to workup his thrombocytopenia.  History of ADD and anxiety.  Continue Zoloft  and Neurontin which are his only home medications at this time does not take Adderall anymore or any other medications  Hypertension with tachycardia.  Likely due to pain.  Pain control, as needed hydralazine and Lopressor.  Monitor on telemetry   DVT Prophylaxis Heparin    AM Labs Ordered, also please review Full Orders  Family Communication: Admission, patients condition and plan of care including tests being ordered have been discussed with the patient and father who indicate understanding and agree with the plan and Code Status.  Code Status full code  Likely DC to  Home  Condition Fair  Consults called: Ortho    Admission status: inpt  Time spent in minutes : 35  Signature  -    Lavada Stank M.D on 09/18/2024 at 4:09 PM   -  To page go to [email or web address]

## 2024-09-18 NOTE — Anesthesia Procedure Notes (Signed)
 Anesthesia Regional Block: Femoral nerve block   Pre-Anesthetic Checklist: , timeout performed,  Correct Patient, Correct Site, Correct Laterality,  Correct Procedure, Correct Position, site marked,  Risks and benefits discussed,  Surgical consent,  Pre-op evaluation,  At surgeon's request and post-op pain management  Laterality: Left and Lower  Prep: chloraprep       Needles:  Injection technique: Single-shot  Needle Type: Echogenic Needle     Needle Length: 9cm  Needle Gauge: 21     Additional Needles:   Procedures:,,,, ultrasound used (permanent image in chart),,    Narrative:  Start time: 09/18/2024 5:45 PM End time: 09/18/2024 5:58 PM Injection made incrementally with aspirations every 5 mL.  Performed by: Personally  Anesthesiologist: Leonce Athens, MD  Additional Notes: Pt identified in Holding room.  Monitors applied. Working IV access confirmed. Timeout, Sterile prep L inguinal crease.  #21ga ECHOgenic Arrow block needle to femoral nerve with US  guidance.  30cc 0.5% Bupivacaine 1:200k epi injected incrementally after negative test dose.  Patient asymptomatic, VSS, no heme aspirated, tolerated well.   JAYSON Leonce, MD

## 2024-09-19 ENCOUNTER — Inpatient Hospital Stay (HOSPITAL_COMMUNITY)

## 2024-09-19 ENCOUNTER — Inpatient Hospital Stay (HOSPITAL_COMMUNITY): Admitting: Anesthesiology

## 2024-09-19 ENCOUNTER — Encounter (HOSPITAL_COMMUNITY): Admission: EM | Disposition: A | Discharge status: Home / Self Care | Payer: Self-pay | Source: Home / Self Care

## 2024-09-19 ENCOUNTER — Encounter (HOSPITAL_COMMUNITY): Payer: Self-pay | Admitting: Internal Medicine

## 2024-09-19 DIAGNOSIS — S7291XA Unspecified fracture of right femur, initial encounter for closed fracture: Secondary | ICD-10-CM | POA: Diagnosis not present

## 2024-09-19 HISTORY — PX: INTRAMEDULLARY (IM) NAIL INTERTROCHANTERIC: SHX5875

## 2024-09-19 LAB — URINALYSIS, ROUTINE W REFLEX MICROSCOPIC
Bilirubin Urine: NEGATIVE
Glucose, UA: NEGATIVE mg/dL
Ketones, ur: 5 mg/dL — AB
Leukocytes,Ua: NEGATIVE
Nitrite: NEGATIVE
Protein, ur: NEGATIVE mg/dL
Specific Gravity, Urine: 1.026 (ref 1.005–1.030)
pH: 5 (ref 5.0–8.0)

## 2024-09-19 LAB — COMPREHENSIVE METABOLIC PANEL WITH GFR
ALT: 20 U/L (ref 0–44)
AST: 22 U/L (ref 15–41)
Albumin: 3.3 g/dL — ABNORMAL LOW (ref 3.5–5.0)
Alkaline Phosphatase: 47 U/L (ref 38–126)
Anion gap: 12 (ref 5–15)
BUN: 8 mg/dL (ref 6–20)
CO2: 21 mmol/L — ABNORMAL LOW (ref 22–32)
Calcium: 8.4 mg/dL — ABNORMAL LOW (ref 8.9–10.3)
Chloride: 109 mmol/L (ref 98–111)
Creatinine, Ser: 0.98 mg/dL (ref 0.61–1.24)
GFR, Estimated: >60 mL/min (ref >60)
Glucose, Bld: 102 mg/dL — ABNORMAL HIGH (ref 70–99)
Potassium: 4 mmol/L (ref 3.5–5.1)
Sodium: 142 mmol/L (ref 135–145)
Total Bilirubin: 1.4 mg/dL — ABNORMAL HIGH (ref 0.0–1.2)
Total Protein: 5.6 g/dL — ABNORMAL LOW (ref 6.5–8.1)

## 2024-09-19 LAB — CBC
HCT: 34.6 % — ABNORMAL LOW (ref 39.0–52.0)
Hemoglobin: 11.6 g/dL — ABNORMAL LOW (ref 13.0–17.0)
MCH: 31.8 pg (ref 26.0–34.0)
MCHC: 33.5 g/dL (ref 30.0–36.0)
MCV: 94.8 fL (ref 80.0–100.0)
Platelets: 111 K/uL — ABNORMAL LOW (ref 150–400)
RBC: 3.65 MIL/uL — ABNORMAL LOW (ref 4.22–5.81)
RDW: 13.7 % (ref 11.5–15.5)
WBC: 4.8 K/uL (ref 4.0–10.5)
nRBC: 0 % (ref 0.0–0.2)

## 2024-09-19 LAB — RAPID URINE DRUG SCREEN, HOSP PERFORMED
Amphetamines: NOT DETECTED
Barbiturates: NOT DETECTED
Benzodiazepines: POSITIVE — AB
Cocaine: NOT DETECTED
Opiates: POSITIVE — AB
Tetrahydrocannabinol: POSITIVE — AB

## 2024-09-19 LAB — CK: Total CK: 110 U/L (ref 49–397)

## 2024-09-19 LAB — SURGICAL PCR SCREEN
MRSA, PCR: NEGATIVE
Staphylococcus aureus: POSITIVE — AB

## 2024-09-19 SURGERY — FIXATION, FRACTURE, INTERTROCHANTERIC, WITH INTRAMEDULLARY ROD
Anesthesia: General | Site: Hip | Laterality: Left

## 2024-09-19 MED ORDER — CHLORHEXIDINE GLUCONATE 0.12 % MT SOLN
OROMUCOSAL | Status: AC
  Filled 2024-09-19: qty 15

## 2024-09-19 MED ORDER — AMISULPRIDE (ANTIEMETIC) 5 MG/2ML IV SOLN
10.0000 mg | Freq: Once | INTRAVENOUS | Status: DC | PRN
Start: 2024-09-19 — End: 2024-09-19

## 2024-09-19 MED ORDER — FENTANYL CITRATE (PF) 250 MCG/5ML IJ SOLN
INTRAMUSCULAR | Status: AC
  Filled 2024-09-19: qty 5

## 2024-09-19 MED ORDER — OXYCODONE HCL 5 MG/5ML PO SOLN: 5.0000 mg | Freq: Once | ORAL | Status: DC | PRN

## 2024-09-19 MED ORDER — CHLORHEXIDINE GLUCONATE CLOTH 2 % EX PADS
6.0000 | MEDICATED_PAD | Freq: Every day | CUTANEOUS | Status: DC
  Administered 2024-09-19 – 2024-09-22 (×4): 6 via TOPICAL

## 2024-09-19 MED ORDER — ACETAMINOPHEN 500 MG PO TABS: 1000.0000 mg | ORAL_TABLET | Freq: Once | ORAL | Status: AC

## 2024-09-19 MED ORDER — GLYCOPYRROLATE PF 0.2 MG/ML IJ SOSY
PREFILLED_SYRINGE | INTRAMUSCULAR | Status: DC | PRN
  Administered 2024-09-19 (×2): .1 mg via INTRAVENOUS

## 2024-09-19 MED ORDER — PHENYLEPHRINE 80 MCG/ML (10ML) SYRINGE FOR IV PUSH (FOR BLOOD PRESSURE SUPPORT)
PREFILLED_SYRINGE | INTRAVENOUS | Status: AC
Start: 2024-09-19 — End: 2024-09-19
  Filled 2024-09-19: qty 10

## 2024-09-19 MED ORDER — CHLORHEXIDINE GLUCONATE CLOTH 2 % EX PADS
6.0000 | MEDICATED_PAD | Freq: Every day | CUTANEOUS | Status: DC
  Administered 2024-09-21 – 2024-09-22 (×2): 6 via TOPICAL

## 2024-09-19 MED ORDER — DEXAMETHASONE SOD PHOSPHATE PF 10 MG/ML IJ SOLN
INTRAMUSCULAR | Status: DC | PRN
  Administered 2024-09-19: 10 mg via INTRAVENOUS

## 2024-09-19 MED ORDER — GLYCOPYRROLATE PF 0.2 MG/ML IJ SOSY
PREFILLED_SYRINGE | INTRAMUSCULAR | Status: AC
Start: 2024-09-19 — End: 2024-09-19
  Filled 2024-09-19: qty 1

## 2024-09-19 MED ORDER — CEFAZOLIN SODIUM-DEXTROSE 2-4 GM/100ML-% IV SOLN
2.0000 g | Freq: Four times a day (QID) | INTRAVENOUS | Status: AC
  Administered 2024-09-19 – 2024-09-20 (×2): 2 g via INTRAVENOUS
  Filled 2024-09-19 (×2): qty 100

## 2024-09-19 MED ORDER — MENTHOL 3 MG MT LOZG: 1.0000 | LOZENGE | OROMUCOSAL | Status: DC | PRN

## 2024-09-19 MED ORDER — CHLORHEXIDINE GLUCONATE 0.12 % MT SOLN
15.0000 mL | Freq: Once | OROMUCOSAL | Status: AC
  Administered 2024-09-19: 15 mL via OROMUCOSAL
  Filled 2024-09-19: qty 15

## 2024-09-19 MED ORDER — CHLORHEXIDINE GLUCONATE 4 % EX SOLN: 1.0000 | CUTANEOUS | 1 refills | Status: DC

## 2024-09-19 MED ORDER — ONDANSETRON HCL 4 MG/2ML IJ SOLN
INTRAMUSCULAR | Status: DC | PRN
  Administered 2024-09-19: 4 mg via INTRAVENOUS

## 2024-09-19 MED ORDER — PHENOL 1.4 % MT LIQD: 1.0000 | OROMUCOSAL | Status: DC | PRN

## 2024-09-19 MED ORDER — LIDOCAINE 2% (20 MG/ML) 5 ML SYRINGE
INTRAMUSCULAR | Status: AC
Start: 2024-09-19 — End: 2024-09-19
  Filled 2024-09-19: qty 5

## 2024-09-19 MED ORDER — ORAL CARE MOUTH RINSE: 15.0000 mL | Freq: Once | OROMUCOSAL | Status: AC

## 2024-09-19 MED ORDER — KETAMINE HCL 10 MG/ML IJ SOLN
INTRAMUSCULAR | Status: DC | PRN
  Administered 2024-09-19: 30 mg via INTRAVENOUS

## 2024-09-19 MED ORDER — METOCLOPRAMIDE HCL 5 MG PO TABS: 5.0000 mg | ORAL_TABLET | Freq: Three times a day (TID) | ORAL | Status: DC | PRN

## 2024-09-19 MED ORDER — ONDANSETRON HCL 4 MG/2ML IJ SOLN
INTRAMUSCULAR | Status: AC
  Filled 2024-09-19: qty 2

## 2024-09-19 MED ORDER — MIDAZOLAM HCL (PF) 2 MG/2ML IJ SOLN
INTRAMUSCULAR | Status: DC | PRN
Start: 2024-09-19 — End: 2024-09-19
  Administered 2024-09-19: 2 mg via INTRAVENOUS

## 2024-09-19 MED ORDER — TRANEXAMIC ACID-NACL 1000-0.7 MG/100ML-% IV SOLN
1000.0000 mg | Freq: Once | INTRAVENOUS | Status: AC
  Administered 2024-09-19: 1000 mg via INTRAVENOUS
  Filled 2024-09-19: qty 100

## 2024-09-19 MED ORDER — FENTANYL CITRATE (PF) 100 MCG/2ML IJ SOLN: 25.0000 ug | INTRAMUSCULAR | Status: DC | PRN

## 2024-09-19 MED ORDER — METOCLOPRAMIDE HCL 5 MG/ML IJ SOLN
5.0000 mg | Freq: Three times a day (TID) | INTRAMUSCULAR | Status: DC | PRN
  Administered 2024-09-20: 10 mg via INTRAVENOUS
  Filled 2024-09-19: qty 2

## 2024-09-19 MED ORDER — FENTANYL CITRATE (PF) 250 MCG/5ML IJ SOLN
INTRAMUSCULAR | Status: DC | PRN
  Administered 2024-09-19: 150 ug via INTRAVENOUS
  Administered 2024-09-19 (×2): 50 ug via INTRAVENOUS

## 2024-09-19 MED ORDER — OXYCODONE HCL 5 MG PO TABS: 5.0000 mg | ORAL_TABLET | Freq: Once | ORAL | Status: DC | PRN

## 2024-09-19 MED ORDER — CEFAZOLIN SODIUM-DEXTROSE 2-4 GM/100ML-% IV SOLN
2.0000 g | INTRAVENOUS | Status: AC
  Administered 2024-09-19: 2 g via INTRAVENOUS
  Filled 2024-09-19: qty 100

## 2024-09-19 MED ORDER — LIDOCAINE 2% (20 MG/ML) 5 ML SYRINGE
INTRAMUSCULAR | Status: DC | PRN
  Administered 2024-09-19: 40 mg via INTRAVENOUS

## 2024-09-19 MED ORDER — DOCUSATE SODIUM 100 MG PO CAPS
100.0000 mg | ORAL_CAPSULE | Freq: Two times a day (BID) | ORAL | Status: DC
  Administered 2024-09-19 – 2024-09-22 (×6): 100 mg via ORAL
  Filled 2024-09-19 (×6): qty 1

## 2024-09-19 MED ORDER — 0.9 % SODIUM CHLORIDE (POUR BTL) OPTIME
TOPICAL | Status: DC | PRN
  Administered 2024-09-19: 1000 mL

## 2024-09-19 MED ORDER — LACTATED RINGERS IV SOLN: INTRAVENOUS | Status: DC

## 2024-09-19 MED ORDER — KETOROLAC TROMETHAMINE 30 MG/ML IJ SOLN
INTRAMUSCULAR | Status: AC
Start: 2024-09-19 — End: 2024-09-19
  Filled 2024-09-19: qty 1

## 2024-09-19 MED ORDER — MUPIROCIN 2 % EX OINT: 1.0000 | TOPICAL_OINTMENT | Freq: Two times a day (BID) | CUTANEOUS | 0 refills | Status: DC

## 2024-09-19 MED ORDER — PROPOFOL 10 MG/ML IV BOLUS
INTRAVENOUS | Status: DC | PRN
  Administered 2024-09-19: 50 mg via INTRAVENOUS
  Administered 2024-09-19: 150 mg via INTRAVENOUS

## 2024-09-19 MED ORDER — ROCURONIUM BROMIDE 10 MG/ML (PF) SYRINGE
PREFILLED_SYRINGE | INTRAVENOUS | Status: DC | PRN
  Administered 2024-09-19: 60 mg via INTRAVENOUS

## 2024-09-19 MED ORDER — ACETAMINOPHEN 500 MG PO TABS
ORAL_TABLET | ORAL | Status: AC
  Administered 2024-09-19: 1000 mg via ORAL
  Filled 2024-09-19: qty 2

## 2024-09-19 MED ORDER — MIDAZOLAM HCL 2 MG/2ML IJ SOLN
INTRAMUSCULAR | Status: AC
Start: 2024-09-19 — End: 2024-09-19
  Filled 2024-09-19: qty 2

## 2024-09-19 MED ORDER — KETAMINE HCL 50 MG/5ML IJ SOSY
PREFILLED_SYRINGE | INTRAMUSCULAR | Status: AC
Start: 2024-09-19 — End: 2024-09-19
  Filled 2024-09-19: qty 5

## 2024-09-19 MED ORDER — SUGAMMADEX SODIUM 200 MG/2ML IV SOLN
INTRAVENOUS | Status: DC | PRN
  Administered 2024-09-19: 200 mg via INTRAVENOUS

## 2024-09-19 MED ORDER — ROCURONIUM BROMIDE 10 MG/ML (PF) SYRINGE
PREFILLED_SYRINGE | INTRAVENOUS | Status: AC
Start: 2024-09-19 — End: 2024-09-19
  Filled 2024-09-19: qty 10

## 2024-09-19 MED ORDER — MUPIROCIN 2 % EX OINT
1.0000 | TOPICAL_OINTMENT | Freq: Two times a day (BID) | CUTANEOUS | Status: DC
  Administered 2024-09-19 – 2024-09-22 (×7): 1 via NASAL
  Filled 2024-09-19 (×2): qty 22

## 2024-09-19 MED ORDER — METHOCARBAMOL 750 MG PO TABS: 750.0000 mg | ORAL_TABLET | Freq: Three times a day (TID) | ORAL | Status: DC | PRN

## 2024-09-19 MED ORDER — DEXMEDETOMIDINE HCL IN NACL 80 MCG/20ML IV SOLN
INTRAVENOUS | Status: DC | PRN
  Administered 2024-09-19: 4 ug via INTRAVENOUS
  Administered 2024-09-19: 8 ug via INTRAVENOUS

## 2024-09-19 MED ORDER — PHENYLEPHRINE 80 MCG/ML (10ML) SYRINGE FOR IV PUSH (FOR BLOOD PRESSURE SUPPORT)
PREFILLED_SYRINGE | INTRAVENOUS | Status: DC | PRN
  Administered 2024-09-19: 160 ug via INTRAVENOUS

## 2024-09-19 MED ORDER — KETOROLAC TROMETHAMINE 30 MG/ML IJ SOLN
INTRAMUSCULAR | Status: DC | PRN
Start: 2024-09-19 — End: 2024-09-19
  Administered 2024-09-19: 15 mg via INTRAVENOUS

## 2024-09-19 SURGICAL SUPPLY — 37 items
BAG COUNTER SPONGE SURGICOUNT (BAG) ×1 IMPLANT
BIT DRILL CANN LG 4.3MM (BIT) IMPLANT
BNDG COHESIVE 6X5 TAN ST LF (GAUZE/BANDAGES/DRESSINGS) ×1 IMPLANT
BRUSH SCRUB EZ 4% CHG (MISCELLANEOUS) ×2 IMPLANT
BRUSH SCRUB EZ PLAIN DRY (MISCELLANEOUS) ×1 IMPLANT
COVER PERINEAL POST (MISCELLANEOUS) ×1 IMPLANT
COVER SURGICAL LIGHT HANDLE (MISCELLANEOUS) ×1 IMPLANT
DRAPE C-ARM 42X72 X-RAY (DRAPES) ×1 IMPLANT
DRAPE C-ARMOR (DRAPES) ×1 IMPLANT
DRAPE INCISE IOBAN 66X45 STRL (DRAPES) ×1 IMPLANT
DRESSING MEPILEX FLEX 4X4 (GAUZE/BANDAGES/DRESSINGS) ×1 IMPLANT
DRSG EMULSION OIL 3X3 NADH (GAUZE/BANDAGES/DRESSINGS) ×1 IMPLANT
DRSG MEPILEX POST OP 4X8 (GAUZE/BANDAGES/DRESSINGS) ×1 IMPLANT
DRSG MEPITEL 4X7.2 (GAUZE/BANDAGES/DRESSINGS) IMPLANT
GLOVE BIO SURGEON STRL SZ8 (GLOVE) ×1 IMPLANT
GLOVE BIOGEL PI IND STRL 7.5 (GLOVE) ×1 IMPLANT
GLOVE BIOGEL PI IND STRL 8 (GLOVE) ×1 IMPLANT
GLOVE ORTHO TXT STRL SZ7.5 (GLOVE) ×1 IMPLANT
GOWN STRL REUS W/ TWL LRG LVL3 (GOWN DISPOSABLE) ×2 IMPLANT
GOWN STRL REUS W/ TWL XL LVL3 (GOWN DISPOSABLE) ×1 IMPLANT
GUIDEPIN VERSANAIL DSP 3.2X444 (ORTHOPEDIC DISPOSABLE SUPPLIES) IMPLANT
KIT BASIN OR (CUSTOM PROCEDURE TRAY) ×1 IMPLANT
KIT TURNOVER KIT B (KITS) ×1 IMPLANT
NAIL HIP FRACT 130D 9X180 (Orthopedic Implant) IMPLANT
PACK GENERAL/GYN (CUSTOM PROCEDURE TRAY) ×1 IMPLANT
PAD ARMBOARD POSITIONER FOAM (MISCELLANEOUS) ×2 IMPLANT
SCREW BONE CORTICAL 5.0X32 (Screw) IMPLANT
SCREW LAG HIP NAIL 10.5X95 (Screw) IMPLANT
SOLN 0.9% NACL POUR BTL 1000ML (IV SOLUTION) ×1 IMPLANT
SOLN STERILE WATER BTL 1000 ML (IV SOLUTION) ×1 IMPLANT
STAPLER SKIN PROX 35W (STAPLE) ×1 IMPLANT
SUT ETHILON 2 0 PSLX (SUTURE) ×1 IMPLANT
SUT VIC AB 0 CT1 27XBRD ANBCTR (SUTURE) ×1 IMPLANT
SUT VIC AB 1 CT1 27XBRD ANBCTR (SUTURE) ×1 IMPLANT
SUT VIC AB 2-0 CT1 TAPERPNT 27 (SUTURE) ×1 IMPLANT
TOWEL GREEN STERILE (TOWEL DISPOSABLE) ×2 IMPLANT
TOWEL GREEN STERILE FF (TOWEL DISPOSABLE) ×1 IMPLANT

## 2024-09-19 NOTE — Progress Notes (Signed)
 Pt. No void in bladder scan MD notified

## 2024-09-19 NOTE — Progress Notes (Signed)
 Pt. Void coude cath not placed MD notified

## 2024-09-19 NOTE — Transfer of Care (Signed)
 Immediate Anesthesia Transfer of Care Note  Patient: Ryan Lynch  Procedure(s) Performed: FIXATION, FRACTURE, INTERTROCHANTERIC, WITH INTRAMEDULLARY ROD (Left: Hip)  Patient Location: PACU  Anesthesia Type:General  Level of Consciousness: drowsy, patient cooperative, and responds to stimulation  Airway & Oxygen Therapy: Patient Spontanous Breathing and Patient connected to face mask oxygen  Post-op Assessment: Report given to RN and Post -op Vital signs reviewed and stable  Post vital signs: Reviewed and stable  Last Vitals:  Vitals Value Taken Time  BP 109/74 09/19/24 14:09  Temp    Pulse 86 09/19/24 14:10  Resp 16 09/19/24 14:10  SpO2 100 % 09/19/24 14:10  Vitals shown include unfiled device data.  Last Pain:  Vitals:   09/19/24 1007  TempSrc:   PainSc: 9          Complications: No notable events documented.

## 2024-09-19 NOTE — Hospital Course (Addendum)
 43 year old male with PMHx remote alcohol abuse, psoriasis, chronic thrombocytopenia, and ADHD who presented to [name] ED on 09/18/2024 after mechanical fall at home.  Patient reportedly slipped on wet floor and fell onto his left hip.  In the ED, he was afebrile, tachycardic to 107, RR 18, BP 145/105, SpO2 199% on RA.  CBC, BMP, and HFP were largely unremarkable.  Ethanol negative.  X-ray of left femur showed acute comminuted displaced and impacted intertrochanteric fracture of the proximal femur with a laterally displaced distal fragment and displaced lesser trochanter fracture fragment.  Orthopedic surgery evaluated the patient and recommended admission for intramedullary nailing.  Assessment and Plan:  Ground-level mechanical fall #Left intertrochanteric fracture s/p IMN (11/6) - Underwent IMN on 11/6 - WBAT w/ walker or crutches - PT/OT recommending home with home health PT  - Post op abx per ortho - Post op anticoagulation per Ortho   # Acute post-operative blood loss anemia - Hgb 8.3, down from 13.5 pre-op - Iron studies altered by acute inflammation (Fe 25, TIBC 256, Fe sat 10%, ferritin 213) - No obvious bleeding at surgical site - FOBT ordered to rule out occult GI bleed, though unlikely - Transfuse for Hgb < 7 - Continue to monitor Hgb and s/s bleeding   # Mild indirect hyperbilirubinemia  - T. bili 1.6, indirect 1.2, normal AST/ALT/ALP, normal LDH - Likely secondary to postop hematoma resorption versus transient stress response - Monitoring for now   #Mild B12 deficiency - B12 175 - IM cyanocobalamin 1000 mcg daily while hospitalized - Transition to p.o. at discharge - Will need to recheck vitamin B12 levels in 4 to 6 weeks   # Sinus tachycardia - Patient noted to be tachycardic to the low 100s during the day - Denies episodes of tachycardia and diaphoresis on ambulation today - Improved with rest   #Urinary retention - Status postplacement of coude catheter on  11/7 - Discontinued catheter today, voiding trial after 6 hours   # Concern for osteoporosis  - 43 year old with history of 3 low trauma fractures in 3 years most likely consistent with fragility fractures concerning for secondary osteoporosis/metabolic bone disease - Reports a history of vitamin D  deficiency but not on supplementation recently - Calcium corrected to 9.0, alk phos 47, AST 22, ALT 20, Phos 3.2 - Vitamin D  low normal at 35 - PTH, free testosterone pending - Vitamin D3 2000 units twice daily   #Chronic thrombocytopenia - Relatively stable, slight decrease likely consumptive postoperatively - Follow-up with PCP for further workup   # Anxiety - Continue home Zoloft  and Neurontin  *** ABLA:  Recent Labs  Lab 09/18/24 1350 09/18/24 1648 09/19/24 0910 09/20/24 0320 09/21/24 0612 09/22/24 0833  HGB 13.3 13.5 11.6* 9.8* 8.3* 8.8*  HCT 37.8* 39.9 34.6* 28.8* 24.1* 25.6*  MCV 91.7 92.4 94.8 93.8 92.7 92.4   Hyperammonemia: Improved. Ammonia Lvl went from 42 -> 22. CTM and Trend and repeat CMP in the AM   Hyperbilirubinemia: T Bili Trend fluctuating but improved. T Bili went from 0.9 -> 1.4 -> 0.9 -> 1.6 -> 1.0. CTM & Trend  Hypoalbuminemia: Patient's Albumin  Lvl went from 3.9 -> 3.3 -> 3.0 -> 3.1 -> 2.6. CTM & Trend & repeat CMP in the AM

## 2024-09-19 NOTE — Anesthesia Procedure Notes (Signed)
 Procedure Name: Intubation Date/Time: 09/19/2024 12:36 PM  Performed by: Myrna Homer, CRNAPre-anesthesia Checklist: Patient identified, Emergency Drugs available, Suction available and Patient being monitored Patient Re-evaluated:Patient Re-evaluated prior to induction Oxygen Delivery Method: Circle System Utilized Preoxygenation: Pre-oxygenation with 100% oxygen Induction Type: IV induction Ventilation: Mask ventilation without difficulty Laryngoscope Size: Mac and 4 Grade View: Grade I Tube type: Oral Tube size: 7.5 mm Number of attempts: 1 Airway Equipment and Method: Stylet and Oral airway Placement Confirmation: ETT inserted through vocal cords under direct vision, positive ETCO2 and breath sounds checked- equal and bilateral Secured at: 23 cm Tube secured with: Tape Dental Injury: Teeth and Oropharynx as per pre-operative assessment

## 2024-09-19 NOTE — Op Note (Addendum)
 PATIENT:  Ryan Lynch  15-Apr-1981 male   MEDICAL RECORD NUMBER: [id number]  PRE-OPERATIVE DIAGNOSIS:  LEFT COMMINUTED INTERTROCHANTERIC HIP FRACTURE  POST-OPERATIVE DIAGNOSIS:  LEFT COMMINUTED INTERTROCHANTERIC HIP FRACTURE  PROCEDURE:  INTRAMEDULLARY NAILING OF THE LEFT HIP using a Biomet Affixus nail 9 mm diameter locked short.  SURGEON:  Ozell DEL. Celena, M.D.  ASSISTANT:  Francis Mt, PA-C.  ANESTHESIA:  General.  COMPLICATIONS:  None.  ESTIMATED BLOOD LOSS:  150 mL.  DISPOSITION:  To PACU.  CONDITION:  Stable.  DELAY START OF DVT PROPHYLAXIS BECAUSE OF BLEEDING RISK: NO  BRIEF SUMMARY AND INDICATION OF PROCEDURE:  [name] is a 43 y.o. year- old with multiple medical problems.  I discussed with the patient the risks and benefits of surgical treatment including the potential for malunion, nonunion, symptomatic hardware, heart attack, stroke, neurovascular injury, bleeding, and others.  After acknowledgement of these risks, consent was provided to proceed.  BRIEF SUMMARY OF PROCEDURE:  The patient was taken to the operating room where general anesthesia was induced.  He was positioned supine on the Hana fracture table.  A closed reduction maneuver was performed of the fractured proximal femur and this was confirmed on both AP and lateral xray views. A thorough scrub and wash with chlorhexidine  and then Betadine scrub and paint was performed.  After sterile drapes and time-out, a long instrument was used to identify the appropriate starting position under C-arm on both AP and lateral images.  A 3 cm incision was made proximal to the greater trochanter.  The curved cannulated awl was inserted just medial to the tip of the lateral trochanter and then the starting guidewire advanced into the proximal femur.  This was checked on AP and lateral views.  The starting reamer was engaged with the soft tissue protected by a sleeve.  The 11 mm short nail was then inserted to the appropriate  depth.  The guidewire for the lag screw was then inserted with the appropriate anteversion to make sure it was in a center-center position.  After placement of an antirotation pin, this was measured and the lag screw placed with excellent purchase and position checked on both views. The set screw was then engaged within the groove of the lag screw, which was allowed to telescope. The antirotation pin was removed. Traction was released and compression achieved with the Compression device over the lag screw.  This was followed by placement of one distal locking screw using the jig.  This was confirmed on AP and lateral images. Wounds were irrigated thoroughly, closed in a standard layered fashion. Sterile gently compressive dressings were applied.  Francis Mt PA-C assisted throughout.  The patient was awakened from anesthesia and transported to the PACU in stable condition.  PROGNOSIS:  The patient will be weightbearing as tolerated with physical therapy beginning DVT prophylaxis as soon as deemed stable by the Primary Care Service.  He has no range of motion precautions.  We will continue to follow while in the hospital.  Anticipate follow up in the office in 2 weeks for removal of sutures and further evaluation.     Ozell DEL. Celena, M.D.

## 2024-09-19 NOTE — Anesthesia Preprocedure Evaluation (Addendum)
 Anesthesia Evaluation  Patient identified by MRN, date of birth, ID band Patient awake    Reviewed: Allergy & Precautions, NPO status , Patient's Chart, lab work & pertinent test results  History of Anesthesia Complications Negative for: history of anesthetic complications  Airway Mallampati: II  TM Distance: >3 FB Neck ROM: Full   Comment: Previous grade I view with glidescope 3 Dental  (+) Dental Advisory Given   Pulmonary neg shortness of breath, neg sleep apnea, neg COPD, neg recent URI, former smoker   Pulmonary exam normal breath sounds clear to auscultation       Cardiovascular (-) hypertension(-) angina (-) Past MI, (-) Cardiac Stents and (-) CABG + dysrhythmias (prolonged QT)  Rhythm:Regular Rate:Normal     Neuro/Psych neg Seizures PSYCHIATRIC DISORDERS (ADD) Anxiety     T7 compression fracture    GI/Hepatic negative GI ROS,,,(+)     substance abuse (remote history)  alcohol use  Endo/Other  negative endocrine ROS    Renal/GU negative Renal ROS     Musculoskeletal  (+) Arthritis  (psoriatic),    Abdominal   Peds  Hematology  (+) Blood dyscrasia (thrombocytopenia) Lab Results      Component                Value               Date                      WBC                      7.6                 09/18/2024                HGB                      13.5                09/18/2024                HCT                      39.9                09/18/2024                MCV                      92.4                09/18/2024                PLT                      148 (L)             09/18/2024              Anesthesia Other Findings Tachycardic to 112, diaphoretic. On CIWA protocol.   Reproductive/Obstetrics                              Anesthesia Physical Anesthesia Plan  ASA: 2  Anesthesia Plan: General   Post-op Pain Management: Tylenol  PO (pre-op)*   Induction:  Intravenous  PONV Risk Score and Plan: 2 and Ondansetron  and  Dexamethasone   Airway Management Planned: Oral ETT  Additional Equipment:   Intra-op Plan:   Post-operative Plan: Extubation in OR  Informed Consent: I have reviewed the patients History and Physical, chart, labs and discussed the procedure including the risks, benefits and alternatives for the proposed anesthesia with the patient or authorized representative who has indicated his/her understanding and acceptance.     Dental advisory given  Plan Discussed with: CRNA and Anesthesiologist  Anesthesia Plan Comments: (Risks of general anesthesia discussed including, but not limited to, sore throat, hoarse voice, chipped/damaged teeth, injury to vocal cords, nausea and vomiting, allergic reactions, lung infection, heart attack, stroke, and death. All questions answered. )         Anesthesia Quick Evaluation

## 2024-09-19 NOTE — Progress Notes (Signed)
 PROGRESS NOTE    Ryan Lynch  FMW:[id number] DOB: Dec 04, 1980 DOA: 09/18/2024 PCP: Patient, No Pcp Per   Brief Narrative:  43 year old male with PMHx remote alcohol abuse, psoriasis, chronic thrombocytopenia, and ADHD who presented to [name], ED on 09/18/2024 after mechanical fall at home.  Patient reportedly slipped on wet floor and fell onto his left hip.  In the ED, he was afebrile, tachycardic to 107, RR 18, BP 145/105, SpO2 199% on RA.  CBC, BMP, and HFP were largely unremarkable.  Ethanol negative.  X-ray of left femur showed acute comminuted displaced and impacted intertrochanteric fracture of the proximal femur with a laterally displaced distal fragment and displaced lesser trochanter fracture fragment.  Orthopedic surgery evaluated the patient and recommended admission for intramedullary nailing.     Assessment and Plan:  #Ground-level mechanical fall #Left intertrochanteric fracture s/p IMN (11/6) - Underwent IMN on 11/6 - WBAT w/ walker or crutches - PT/OT postop eval - Post op abx per ortho - Post op anticoagulation per Ortho  # Concern for osteoporosis  - 43 year old with history of 3 low trauma fractures in 3 years most likely consistent with fragility fractures concerning for secondary osteoporosis/metabolic bone disease - Reports a history of vitamin D  deficiency but not on supplementation recently - Calcium corrected to 9.0, alk phos 47, AST 22, ALT 20 - Ordered PTH, phosphorus, free testosterone, vitamin D  level  #Chronic thrombocytopenia - Appears stable - Follow-up with PCP for further workup  # Anxiety - Continue home Zoloft  and Neurontin  # Sinus tachycardia - Likely secondary to pain  DVT prophylaxis: SCDs Start: 09/19/24 1516 heparin injection 5,000 Units Start: 09/18/24 1615   Code Status:   Code Status: Full Code  Family Communication: None  Disposition Plan: Pending PT/OT eval. Patient had indicated he would return to his parent's home. PT  Follow up Recs:   Level of care: Telemetry  Consultants:  Orthopedic surgery  Procedures:  Intramedullary nailing of left intertrochanteric hip fracture  Antimicrobials: None    Subjective: Patient seen after surgery.  Still groggy from anesthesia.  Pain is currently well-tolerated, anesthesia has not worn off yet.  No other complaints at this time.  Objective: Vitals:   09/19/24 1430 09/19/24 1445 09/19/24 1500 09/19/24 1520  BP: 107/79 107/74 118/76 118/83  Pulse: 92 95 97 99  Resp: 17 18 19 17   Temp:   98.4 F (36.9 C) 98.3 F (36.8 C)  TempSrc:    Oral  SpO2: 100% 96% 97% 97%  Weight:      Height:        Intake/Output Summary (Last 24 hours) at 09/19/2024 1719 Last data filed at 09/19/2024 1506 Gross per 24 hour  Intake 1120.27 ml  Output 190 ml  Net 930.27 ml   Filed Weights   09/18/24 1307 09/19/24 1005  Weight: 77.1 kg 77.1 kg    Examination:  General exam: Appears calm and comfortable, still sleepy post anesthesia Respiratory system: Clear to auscultation. Respiratory effort normal. Cardiovascular system: S1 & S2 heard, RRR. No JVD, murmurs. No pedal edema. Gastrointestinal system: Abdomen is nondistended, soft and nontender. No organomegaly or masses felt. Normal bowel sounds heard. Central nervous system: Alert and oriented. No focal neurological deficits. Extremities: Symmetric 5 x 5 power. LLE hip dressing in place with ice pack overlaying it. Neurovascularly intact distally Skin: No rashes, lesions or ulcers Psychiatry: Judgement and insight appear normal. Mood & affect appropriate.     Data Reviewed: I have personally reviewed following labs  and imaging studies  CBC: Recent Labs  Lab 09/18/24 1350 09/18/24 1648 09/19/24 0910  WBC 4.9 7.6 4.8  NEUTROABS 3.9  --   --   HGB 13.3 13.5 11.6*  HCT 37.8* 39.9 34.6*  MCV 91.7 92.4 94.8  PLT 129* 148* 111*   Basic Metabolic Panel: Recent Labs  Lab 09/18/24 1350 09/18/24 1648  09/19/24 0910  NA 140  --  142  K 4.2  --  4.0  CL 104  --  109  CO2 23  --  21*  GLUCOSE 135*  --  102*  BUN 5*  --  8  CREATININE 0.93 1.01 0.98  CALCIUM 8.9  --  8.4*   GFR: Estimated Creatinine Clearance: 98.3 mL/min (by C-G formula based on SCr of 0.98 mg/dL). Liver Function Tests: Recent Labs  Lab 09/18/24 1648 09/19/24 0910  AST 28 22  ALT 21 20  ALKPHOS 53 47  BILITOT 0.9 1.4*  PROT 6.4* 5.6*  ALBUMIN  3.9 3.3*   No results for input(s): LIPASE, AMYLASE in the last 168 hours. No results for input(s): AMMONIA in the last 168 hours. Coagulation Profile: Recent Labs  Lab 09/18/24 1648  INR 1.1   Cardiac Enzymes: Recent Labs  Lab 09/19/24 0910  CKTOTAL 110   BNP (last 3 results) No results for input(s): PROBNP in the last 8760 hours. HbA1C: No results for input(s): HGBA1C in the last 72 hours. CBG: No results for input(s): GLUCAP in the last 168 hours. Lipid Profile: No results for input(s): CHOL, HDL, LDLCALC, TRIG, CHOLHDL, LDLDIRECT in the last 72 hours. Thyroid Function Tests: No results for input(s): TSH, T4TOTAL, FREET4, T3FREE, THYROIDAB in the last 72 hours. Anemia Panel: No results for input(s): VITAMINB12, FOLATE, FERRITIN, TIBC, IRON, RETICCTPCT in the last 72 hours. Sepsis Labs: No results for input(s): PROCALCITON, LATICACIDVEN in the last 168 hours.  Recent Results (from the past 240 hours)  Surgical pcr screen     Status: Abnormal   Collection Time: 09/19/24  4:03 AM   Specimen: Nasal Mucosa; Nasal Swab  Result Value Ref Range Status   MRSA, PCR NEGATIVE NEGATIVE Final   Staphylococcus aureus POSITIVE (A) NEGATIVE Final    Comment: (NOTE) The Xpert SA Assay (FDA approved for NASAL specimens in patients 68 years of age and older), is one component of a comprehensive surveillance program. It is not intended to diagnose infection nor to guide or monitor treatment. Performed at [facility], [address], [place], [place] [address]      Radiology Studies: DG FEMUR PORT MIN 2 VIEWS LEFT Result Date: 09/19/2024 CLINICAL DATA:  Postop. EXAM: LEFT FEMUR PORTABLE 2 VIEWS COMPARISON:  Preoperative imaging FINDINGS: Femoral intramedullary nail with trans trochanteric and distal locking screw fixation traverse proximal femur fracture. Improved fracture alignment from preoperative imaging. Recent postsurgical change includes air and edema in the soft tissues. Ghost tracks in the femoral shaft from prior external fixator. IMPRESSION: ORIF of proximal femur fracture, without immediate postoperative complication. Electronically Signed   By: Andrea Gasman M.D.   On: 09/19/2024 15:40   DG HIP UNILAT WITH PELVIS 2-3 VIEWS LEFT Result Date: 09/19/2024 CLINICAL DATA:  461500 Elective surgery 461500 EXAM: DG HIP (WITH OR WITHOUT PELVIS) 2-3V LEFT COMPARISON:  Preoperative imaging FINDINGS: Seven fluoroscopic spot views of the left hip submitted from the operating room. Femoral intramedullary nail with trans trochanteric and distal locking screw fixation traverse proximal femur fracture. Fluoroscopy time 1 minutes 3 seconds. Dose 14.08 mGy.  IMPRESSION: Intraoperative fluoroscopy during left proximal femur fracture ORIF. Electronically Signed   By: Andrea Gasman M.D.   On: 09/19/2024 15:39   DG C-Arm 1-60 Min-No Report Result Date: 09/19/2024 Fluoroscopy was utilized by the requesting physician.  No radiographic interpretation.   DG C-Arm 1-60 Min-No Report Result Date: 09/19/2024 Fluoroscopy was utilized by the requesting physician.  No radiographic interpretation.   DG Pelvis 1-2 Views Result Date: 09/18/2024 EXAM: 1 or 2 VIEW(S) XRAY OF THE PELVIS 09/18/2024 02:23:00 PM COMPARISON: CT 07/10/2022. CLINICAL HISTORY: 809823 Fall 190176. FINDINGS: BONES AND JOINTS: Acute comminuted left femoral intertrochanteric fracture with varus angulation. Mildly displaced lesser trochanter fracture  fragment. No joint dislocation. SOFT TISSUES: The soft tissues are unremarkable. IMPRESSION: 1. Acute comminuted left femoral intertrochanteric fracture with varus angulation and a mildly displaced lesser trochanter fracture fragment. Electronically signed by: Dayne Hassell MD 09/18/2024 02:50 PM EST RP Workstation: [id number]   DG Femur Min 2 Views Left Result Date: 09/18/2024 EXAM: 2 VIEW(S) XRAY OF THE LEFT FEMUR 09/18/2024 02:23:00 PM COMPARISON: CT 07/10/2022. CLINICAL HISTORY: Mechanical fall with pivot to L side, heard an audible pop, severe L sided Hip pain. FINDINGS: BONES AND JOINTS: Acute comminuted displaced and impacted intertrochanteric fracture of the proximal femur. Laterally displaced distal fragment. Displaced lesser trochanter fracture fragment. Remote hardware defects in the mid femoral shaft. Partially visualized plate and screw fixation of the lateral tibial plateau in place. No joint dislocation. SOFT TISSUES: The soft tissues are unremarkable. IMPRESSION: 1. Acute comminuted displaced and impacted intertrochanteric fracture of the proximal femur with laterally displaced distal fragment and displaced lesser trochanter fracture fragment. Electronically signed by: Dayne Hassell MD 09/18/2024 02:49 PM EST RP Workstation: [id number]    Scheduled Meds:  amoxicillin-clavulanate  1 tablet Oral Q12H   chlordiazePOXIDE   20 mg Oral TID   chlorhexidine        Chlorhexidine  Gluconate Cloth  6 each Topical Daily   Chlorhexidine  Gluconate Cloth  6 each Topical Daily   cholecalciferol   400 Units Oral Daily   docusate sodium   100 mg Oral BID   folic acid   1 mg Oral Daily   gabapentin  600 mg Oral TID   heparin  5,000 Units Subcutaneous Q8H   multivitamin with minerals  1 tablet Oral Daily   mupirocin ointment  1 Application Nasal BID   sertraline   100 mg Oral Daily   thiamine   100 mg Oral Daily   Or   thiamine   100 mg Intravenous Daily   Continuous Infusions:   ceFAZolin  (ANCEF ) IV        LOS:  LOS: 1 day   Time Spent: 35 minutes  Unresulted Labs (From admission, onward)     Start     Ordered   09/20/24 0500  VITAMIN D  25 Hydroxy (Vit-D Deficiency, Fractures)  Tomorrow morning,   R        09/19/24 0855   09/20/24 0500  CBC  Tomorrow morning,   R        09/19/24 1515   09/20/24 0500  Comprehensive metabolic panel  Tomorrow morning,   R        09/19/24 1515   09/20/24 0500  Ammonia  Tomorrow morning,   R        09/19/24 1515             Tabbitha Janvrin Al-Sultani, MD [facility]  If 7PM-7AM, please contact night-coverage  09/19/2024, 5:19 PM

## 2024-09-19 NOTE — Consult Note (Signed)
 Orthopaedic Trauma Service (OTS) Consult   Patient ID: Ryan Lynch MRN: [id number] DOB/AGE: 01-15-1981 43 y.o.   Reason for Consult: Left hip fracture Referring Physician: Rolan Higashi, MD (ortho)   HPI: Ryan Lynch is an 43 y.o. male well-known to the orthopedic trauma service after sustaining a complex left tibial plateau fracture back in August 2023 that was treated with surgical intervention.  Patient also had a compartment syndrome at that time which was also treated with fasciotomies.  Patient has a history of alcohol abuse as well as polysubstance use.  Unfortunately he presented to [facility] emergency department yesterday evening after sustaining a ground-level fall.  Per report he slipped on a wet floor falling directly on his left hip resulting in a fracture.  Patient had immediate onset of pain and inability to bear weight.  Brought to [facility] for evaluation.  He was found to have a three-part left intertrochanteric hip fracture.  Patient was admitted to the medical service with orthopedic consultation.  Due to's injury as well as to expedite his care with orthopedic trauma service was consulted for surgical management.  Patient seen and evaluated on 5 N.  He did receive fascia iliaca block yesterday evening and feels like it has helped his pain.  It does feel like it is wearing off.  Denies any other pain elsewhere.  Denies any numbness or tingling in his left lower extremity  States that he does not drink regularly anymore and only does so when he is anxious or stressed out  Denies other illicit substance abuse.  We discussed positive THC on his toxicology screen and he states that these are from delta 8 Gummies.  He also uses these infrequently.  Still intermittent nicotine use as well.  Patient with in [place]  as well as here in [place].  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 Addie  History of vitamin D  deficiency also noted  Past Medical History:  Diagnosis Date   Acute anemia 06/2022   Acute metabolic encephalopathy 06/2022   Acute urinary retention 06/2022   ADD (attention deficit disorder)    Adrenal nodule 06/2022   AKI (acute kidney injury) 06/2022   Broken arm 09/2023   Broken humerus    Closed nondisplaced fracture of left tibial plateau 07/2022   Multiple rib fractures 06/2022   Psoriasis    Psoriatic arthritis ([name])    Thrombocytopenia 06/2022   Traumatic rhabdomyolysis 06/2022    Past Surgical History:  Procedure Laterality Date   ANTERIOR CRUCIATE LIGAMENT REPAIR Right    ANTERIOR CRUCIATE LIGAMENT REPAIR Right    APPLICATION OF WOUND VAC Left 07/02/2022   Procedure: APPLICATION OF WOUND VAC;  Surgeon: Celena Sharper, MD;  Location: [facility] OR;  Service: Orthopedics;  Laterality: Left;   APPLICATION OF WOUND VAC Left 07/05/2022   Procedure: APPLICATION OF WOUND VAC;  Surgeon: Celena Sharper, MD;  Location: [facility] OR;  Service: Orthopedics;  Laterality: Left;   CLOSED REDUCTION TIBIA Left 07/02/2022   Procedure: CLOSED REDUCTION TIBIA;  Surgeon: Celena Sharper, MD;  Location: [facility] OR;  Service: Orthopedics;  Laterality: Left;   EXTERNAL FIXATION LEG Left 07/02/2022   Procedure: EXTERNAL FIXATION LEG;  Surgeon: Celena Sharper, MD;  Location: [facility] OR;  Service: Orthopedics;  Laterality: Left;  FASCIOTOMY Left 07/02/2022   Procedure: FASCIOTOMY;  Surgeon: Celena Sharper, MD;  Location: [facility] OR;  Service: Orthopedics;  Laterality: Left;   I & D EXTREMITY Left 07/07/2022   Procedure: IRRIGATION AND DEBRIDEMENT EXTREMITY;  Surgeon: Celena Sharper, MD;  Location: [facility] OR;  Service: Orthopedics;  Laterality: Left;   ORIF TIBIA PLATEAU Left 07/05/2022   Procedure: OPEN REDUCTION INTERNAL FIXATION (ORIF) TIBIAL PLATEAU;  Surgeon: Celena Sharper, MD;  Location: [facility] OR;   Service: Orthopedics;  Laterality: Left;   SECONDARY CLOSURE OF WOUND Left 07/05/2022   Procedure: SECONDARY CLOSURE OF WOUND;  Surgeon: Celena Sharper, MD;  Location: [facility] OR;  Service: Orthopedics;  Laterality: Left;   SECONDARY CLOSURE OF WOUND Left 07/07/2022   Procedure: SECONDARY CLOSURE OF WOUND;  Surgeon: Celena Sharper, MD;  Location: [facility] OR;  Service: Orthopedics;  Laterality: Left;   SHOULDER SURGERY Bilateral    WISDOM TOOTH EXTRACTION  05/23/2023    Family History  Problem Relation Age of Onset   Osteoporosis Mother    Arthritis Father     Social History:  reports that he has quit smoking. His smoking use included cigarettes. He has been exposed to tobacco smoke. He has never used smokeless tobacco. He reports that he does not currently use alcohol. He reports that he does not use drugs.  Allergies:  Allergies  Allergen Reactions   [name]    Itching eyes     Medications: I have reviewed the patient's current medications. Current Outpatient Medications  Medication Instructions   amoxicillin-clavulanate (AUGMENTIN) 875-125 MG tablet 1 tablet, Oral, Every 12 hours   amphetamine-dextroamphetamine (ADDERALL) 5 MG tablet 1 tablet, 3 times daily   Cosentyx  UnoReady 300 mg, Subcutaneous, Every 28 days   gabapentin (NEURONTIN) 600 mg, Oral, 3 times daily   ibuprofen (ADVIL) 600 mg, Oral, Daily PRN   promethazine -dextromethorphan (PROMETHAZINE -DM) 6.25-15 MG/5ML syrup 5 mLs, Oral, 4 times daily PRN   sertraline  (ZOLOFT ) 100 mg, Oral, Daily   VITAMIN D  PO 2 capsules, Oral, Daily     Results for orders placed or performed during the hospital encounter of 09/18/24 (from the past 48 hours)  CBC with Differential     Status: Abnormal   Collection Time: 09/18/24  1:50 PM  Result Value Ref Range   WBC 4.9 4.0 - 10.5 K/uL   RBC 4.12 (L) 4.22 - 5.81 MIL/uL   Hemoglobin 13.3 13.0 - 17.0 g/dL   HCT 62.1 (L) 60.9 - 47.9 %   MCV 91.7 80.0 -  100.0 fL   MCH 32.3 26.0 - 34.0 pg   MCHC 35.2 30.0 - 36.0 g/dL   RDW 86.3 88.4 - 84.4 %   Platelets 129 (L) 150 - 400 K/uL   nRBC 0.0 0.0 - 0.2 %   Neutrophils Relative % 80 %   Neutro Abs 3.9 1.7 - 7.7 K/uL   Lymphocytes Relative 13 %   Lymphs Abs 0.7 0.7 - 4.0 K/uL   Monocytes Relative 5 %   Monocytes Absolute 0.3 0.1 - 1.0 K/uL   Eosinophils Relative 1 %   Eosinophils Absolute 0.1 0.0 - 0.5 K/uL   Basophils Relative 1 %   Basophils Absolute 0.0 0.0 - 0.1 K/uL   Immature Granulocytes 0 %   Abs Immature Granulocytes 0.01 0.00 - 0.07 K/uL    Comment: Performed at [facility], [address] [place], [place] [address]  Basic metabolic panel     Status: Abnormal   Collection  Time: 09/18/24  1:50 PM  Result Value Ref Range   Sodium 140 135 - 145 mmol/L   Potassium 4.2 3.5 - 5.1 mmol/L   Chloride 104 98 - 111 mmol/L   CO2 23 22 - 32 mmol/L   Glucose, Bld 135 (H) 70 - 99 mg/dL    Comment: Glucose reference range applies only to samples taken after fasting for at least 8 hours.   BUN 5 (L) 6 - 20 mg/dL   Creatinine, Ser 9.06 0.61 - 1.24 mg/dL   Calcium 8.9 8.9 - 89.6 mg/dL   GFR, Estimated >39 >39 mL/min    Comment: (NOTE) Calculated using the CKD-EPI Creatinine Equation (2021)    Anion gap 13 5 - 15    Comment: Performed at [facility], [address], [place], [place] [address]  Type and screen     Status: None   Collection Time: 09/18/24  4:45 PM  Result Value Ref Range   ABO/RH(D) O POS    Antibody Screen NEG    Sample Expiration      09/21/2024,2359 Performed at [facility], [address] [place], [place] [address]   Ethanol     Status: None   Collection Time: 09/18/24  4:48 PM  Result Value Ref Range   Alcohol, Ethyl (B) <15 <15 mg/dL    Comment: (NOTE) For medical purposes only. Performed at [facility], [address] [place], [place] [address]   Protime-INR     Status: None   Collection Time: 09/18/24  4:48 PM  Result Value  Ref Range   Prothrombin Time 14.6 11.4 - 15.2 seconds   INR 1.1 0.8 - 1.2    Comment: (NOTE) INR goal varies based on device and disease states. Performed at [facility], [address] [place], [place] [address]   Hepatic function panel     Status: Abnormal   Collection Time: 09/18/24  4:48 PM  Result Value Ref Range   Total Protein 6.4 (L) 6.5 - 8.1 g/dL   Albumin  3.9 3.5 - 5.0 g/dL   AST 28 15 - 41 U/L   ALT 21 0 - 44 U/L   Alkaline Phosphatase 53 38 - 126 U/L   Total Bilirubin 0.9 0.0 - 1.2 mg/dL   Bilirubin, Direct 0.2 0.0 - 0.2 mg/dL   Indirect Bilirubin 0.7 0.3 - 0.9 mg/dL    Comment: Performed at [facility], [address], [place], [place] [address]  HIV Antibody (routine testing w rflx)     Status: None   Collection Time: 09/18/24  4:48 PM  Result Value Ref Range   HIV Screen 4th Generation wRfx Non Reactive Non Reactive    Comment: Performed at [facility], [address] [place], [place] [address]  CBC     Status: Abnormal   Collection Time: 09/18/24  4:48 PM  Result Value Ref Range   WBC 7.6 4.0 - 10.5 K/uL   RBC 4.32 4.22 - 5.81 MIL/uL   Hemoglobin 13.5 13.0 - 17.0 g/dL   HCT 60.0 60.9 - 47.9 %   MCV 92.4 80.0 - 100.0 fL   MCH 31.3 26.0 - 34.0 pg   MCHC 33.8 30.0 - 36.0 g/dL   RDW 86.2 88.4 - 84.4 %   Platelets 148 (L) 150 - 400 K/uL   nRBC 0.0 0.0 - 0.2 %    Comment: Performed at [facility], [address] [place],  [place] [address]  Creatinine, serum     Status: None   Collection Time: 09/18/24  4:48 PM  Result Value Ref Range   Creatinine, Ser 1.01 0.61 - 1.24 mg/dL   GFR, Estimated >39 >39 mL/min    Comment: (NOTE) Calculated using the CKD-EPI Creatinine Equation (2021) Performed at [facility], [address] [place], [place] [address]   Urinalysis, Routine w reflex microscopic -Urine, Clean Catch     Status: Abnormal   Collection Time: 09/19/24  4:03 AM  Result Value Ref Range   Color, Urine AMBER (A)  YELLOW    Comment: BIOCHEMICALS MAY BE AFFECTED BY COLOR   APPearance CLEAR CLEAR   Specific Gravity, Urine 1.026 1.005 - 1.030   pH 5.0 5.0 - 8.0   Glucose, UA NEGATIVE NEGATIVE mg/dL   Hgb urine dipstick SMALL (A) NEGATIVE   Bilirubin Urine NEGATIVE NEGATIVE   Ketones, ur 5 (A) NEGATIVE mg/dL   Protein, ur NEGATIVE NEGATIVE mg/dL   Nitrite NEGATIVE NEGATIVE   Leukocytes,Ua NEGATIVE NEGATIVE   RBC / HPF 0-5 0 - 5 RBC/hpf   WBC, UA 0-5 0 - 5 WBC/hpf   Bacteria, UA MANY (A) NONE SEEN   Squamous Epithelial / HPF 0-5 0 - 5 /HPF   Mucus PRESENT    Uric Acid Crys, UA PRESENT     Comment: Performed at [facility], [address], [place], [place] [address]  Rapid urine drug screen (hospital performed)     Status: Abnormal   Collection Time: 09/19/24  4:03 AM  Result Value Ref Range   Opiates POSITIVE (A) NONE DETECTED   Cocaine NONE DETECTED NONE DETECTED   Benzodiazepines POSITIVE (A) NONE DETECTED   Amphetamines NONE DETECTED NONE DETECTED   Tetrahydrocannabinol POSITIVE (A) NONE DETECTED   Barbiturates NONE DETECTED NONE DETECTED    Comment: (NOTE) DRUG SCREEN FOR MEDICAL PURPOSES ONLY.  IF CONFIRMATION IS NEEDED FOR ANY PURPOSE, NOTIFY LAB WITHIN 5 DAYS.  LOWEST DETECTABLE LIMITS FOR URINE DRUG SCREEN Drug Class                     Cutoff (ng/mL) Amphetamine and metabolites    1000 Barbiturate and metabolites    200 Benzodiazepine                 200 Opiates and metabolites        300 Cocaine and metabolites        300 THC                            50 Performed at [facility], [address], [place], [place] [address]   Surgical pcr screen     Status: Abnormal   Collection Time: 09/19/24  4:03 AM   Specimen: Nasal Mucosa; Nasal Swab  Result Value Ref Range   MRSA, PCR NEGATIVE NEGATIVE   Staphylococcus aureus POSITIVE (A) NEGATIVE    Comment: (NOTE) The Xpert SA Assay (FDA approved for NASAL specimens in patients 59 years of age and older), is one  component of a comprehensive surveillance program. It is not intended to diagnose infection nor to guide or monitor treatment. Performed at [facility], [address] [place], [place] [address]     DG Pelvis 1-2 Views Result Date: 09/18/2024 EXAM: 1 or 2 VIEW(S) XRAY OF THE PELVIS 09/18/2024 02:23:00 PM COMPARISON: CT 07/10/2022. CLINICAL HISTORY: 809823 Fall 190176. FINDINGS:  BONES AND JOINTS: Acute comminuted left femoral intertrochanteric fracture with varus angulation. Mildly displaced lesser trochanter fracture fragment. No joint dislocation. SOFT TISSUES: The soft tissues are unremarkable. IMPRESSION: 1. Acute comminuted left femoral intertrochanteric fracture with varus angulation and a mildly displaced lesser trochanter fracture fragment. Electronically signed by: Dayne Hassell MD 09/18/2024 02:50 PM EST RP Workstation: [id number]   DG Femur Min 2 Views Left Result Date: 09/18/2024 EXAM: 2 VIEW(S) XRAY OF THE LEFT FEMUR 09/18/2024 02:23:00 PM COMPARISON: CT 07/10/2022. CLINICAL HISTORY: Mechanical fall with pivot to L side, heard an audible pop, severe L sided Hip pain. FINDINGS: BONES AND JOINTS: Acute comminuted displaced and impacted intertrochanteric fracture of the proximal femur. Laterally displaced distal fragment. Displaced lesser trochanter fracture fragment. Remote hardware defects in the mid femoral shaft. Partially visualized plate and screw fixation of the lateral tibial plateau in place. No joint dislocation. SOFT TISSUES: The soft tissues are unremarkable. IMPRESSION: 1. Acute comminuted displaced and impacted intertrochanteric fracture of the proximal femur with laterally displaced distal fragment and displaced lesser trochanter fracture fragment. Electronically signed by: Katheleen Faes MD 09/18/2024 02:49 PM EST RP Workstation: [id number]    Intake/Output      11/05 0701 11/06 0700 11/06 0701 11/07 0700   P.O. 120    I.V. (mL/kg) 0.3 (0)    Total Intake(mL/kg)  120.3 (1.6)    Urine (mL/kg/hr) 150    Total Output 150    Net -29.7            Review of Systems  Constitutional:  Positive for diaphoresis.  Respiratory:  Negative for shortness of breath.   Cardiovascular:  Negative for chest pain and palpitations.  Gastrointestinal:  Negative for nausea and vomiting.  Musculoskeletal:  Positive for joint pain (left hip pain).    Blood pressure 129/89, pulse 99, temperature 98.6 F (37 C), temperature source Oral, resp. rate 17, height 5' 10 (1.778 m), weight 77.1 kg, SpO2 98%. Physical Exam  Gen: Sitting up in bed appears appropriate from what I remember from previous encounters.  He is a little diaphoretic Cardiac: S1 and S2, regular rate and rhythm Lungs: unlabored, no increased WOB Abd: NTND, + BS  Left lower extremity  Left leg is shortened and externally rotated through the hip  Mild swelling noted to the left hip  No ecchymosis or traumatic wounds appreciated  Lower leg is unremarkable, fasciotomy wounds are well-healed  He does have some psoriatic plaques but these appear to be stable  Extremity is warm  + DP pulse  Compartments are soft  No pain out of proportion for stressing of his toes or his ankle.  Did not manipulate his leg above his knee due to acute fracture to his left hip  DPN, SPN, TN sensory functions are intact  EHL, FHL, lesser toe motor functions are intact  Ankle flexion and extension are intact, inversion and eversion also intact at the ankle     Assessment/Plan:  43 year old male ground-level fall with left hip fracture  -Ground-level fall  -Left three-part intertrochanteric hip fracture  Plan for intramedullary nailing later this afternoon  He will be weightbearing as tolerated postoperatively with walker or crutches  He will have no motion restrictions to his left hip or his knee  Therapy evaluations postoperatively   Patient states that he will likely go stay with his parents at discharge   This  is now patient's third fracture in 3 years.  Left tibial plateau fracture 2023 resulted after he fell down some  stairs, right proximal humerus fracture from last year occurred after he tripped over a building sign and this most recent left hip fracture was for him slipping on a wet floor.  This is very odd for someone of his age.  In addition to vitamin D  panel and also obtain TSH, PTH and testosterone panel.   - Pain management:  Multimodal   - ABL anemia/Hemodynamics  Monitor   Cbc this am   - Medical issues   Per primary   - DVT/PE prophylaxis:  Lovenox  post op  DOAC x 30 days at DC   - ID:   Periop abx  - Metabolic Bone Disease:  Labs as noted above  Would argue that all of his fractures are fragility fracture   At this rate I think he needs a DEXA given history of 3 fractures from falls in 3 years  - Activity:  As above  - FEN/GI prophylaxis/Foley/Lines:  NPO, advance post op   - Impediments to fracture healing:  Serial low energy fractures   Polysubstance use   - Dispo:  OR later today to address L hip fracture     Francis MICAEL Mt, PA-C [phone] (C) 09/19/2024, 10:03 AM  [facility] [address] [place] [address] [phone] [phone] (F)    After 5pm and on the weekends please log on to Amion, go to orthopaedics and the look under the [facility] Call for the provider(s) on call. You can also call our office at [phone] and then follow the prompts to be connected to the call team.

## 2024-09-19 NOTE — Progress Notes (Signed)
 In and out cath unsuccessful due to enlarge prostate MD notified

## 2024-09-20 ENCOUNTER — Encounter (HOSPITAL_COMMUNITY): Payer: Self-pay | Admitting: Orthopedic Surgery

## 2024-09-20 ENCOUNTER — Telehealth: Payer: Self-pay

## 2024-09-20 ENCOUNTER — Other Ambulatory Visit: Payer: Self-pay

## 2024-09-20 DIAGNOSIS — S7291XA Unspecified fracture of right femur, initial encounter for closed fracture: Secondary | ICD-10-CM | POA: Diagnosis not present

## 2024-09-20 LAB — RETICULOCYTES
Immature Retic Fract: 17.4 % — ABNORMAL HIGH (ref 2.3–15.9)
RBC.: 3.1 MIL/uL — ABNORMAL LOW (ref 4.22–5.81)
Retic Count, Absolute: 102.3 K/uL (ref 19.0–186.0)
Retic Ct Pct: 3.3 % — ABNORMAL HIGH (ref 0.4–3.1)

## 2024-09-20 LAB — IRON AND TIBC
Iron: 25 ug/dL — ABNORMAL LOW (ref 45–182)
Saturation Ratios: 10 % — ABNORMAL LOW (ref 17.9–39.5)
TIBC: 256 ug/dL (ref 250–450)
UIBC: 231 ug/dL

## 2024-09-20 LAB — PHOSPHORUS: Phosphorus: 3.2 mg/dL (ref 2.5–4.6)

## 2024-09-20 LAB — CBC
HCT: 28.8 % — ABNORMAL LOW (ref 39.0–52.0)
Hemoglobin: 9.8 g/dL — ABNORMAL LOW (ref 13.0–17.0)
MCH: 31.9 pg (ref 26.0–34.0)
MCHC: 34 g/dL (ref 30.0–36.0)
MCV: 93.8 fL (ref 80.0–100.0)
Platelets: 112 K/uL — ABNORMAL LOW (ref 150–400)
RBC: 3.07 MIL/uL — ABNORMAL LOW (ref 4.22–5.81)
RDW: 14.2 % (ref 11.5–15.5)
WBC: 5.4 K/uL (ref 4.0–10.5)
nRBC: 0 % (ref 0.0–0.2)

## 2024-09-20 LAB — COMPREHENSIVE METABOLIC PANEL WITH GFR
ALT: 16 U/L (ref 0–44)
AST: 21 U/L (ref 15–41)
Albumin: 3 g/dL — ABNORMAL LOW (ref 3.5–5.0)
Alkaline Phosphatase: 44 U/L (ref 38–126)
Anion gap: 12 (ref 5–15)
BUN: 11 mg/dL (ref 6–20)
CO2: 26 mmol/L (ref 22–32)
Calcium: 8.3 mg/dL — ABNORMAL LOW (ref 8.9–10.3)
Chloride: 100 mmol/L (ref 98–111)
Creatinine, Ser: 0.95 mg/dL (ref 0.61–1.24)
GFR, Estimated: >60 mL/min (ref >60)
Glucose, Bld: 106 mg/dL — ABNORMAL HIGH (ref 70–99)
Potassium: 3.8 mmol/L (ref 3.5–5.1)
Sodium: 138 mmol/L (ref 135–145)
Total Bilirubin: 0.9 mg/dL (ref 0.0–1.2)
Total Protein: 5.4 g/dL — ABNORMAL LOW (ref 6.5–8.1)

## 2024-09-20 LAB — FOLATE: Folate: 9.2 ng/mL (ref >5.9)

## 2024-09-20 LAB — FERRITIN: Ferritin: 213 ng/mL (ref 24–336)

## 2024-09-20 LAB — VITAMIN D 25 HYDROXY (VIT D DEFICIENCY, FRACTURES): Vit D, 25-Hydroxy: 35.76 ng/mL (ref 30–100)

## 2024-09-20 LAB — VITAMIN B12: Vitamin B-12: 175 pg/mL — ABNORMAL LOW (ref 180–914)

## 2024-09-20 LAB — AMMONIA: Ammonia: 42 umol/L — ABNORMAL HIGH (ref 9–35)

## 2024-09-20 MED ORDER — LIDOCAINE HCL URETHRAL/MUCOSAL 2 % EX GEL
1.0000 | Freq: Once | CUTANEOUS | Status: AC
  Administered 2024-09-20: 1 via URETHRAL
  Filled 2024-09-20: qty 6

## 2024-09-20 MED ORDER — VITAMIN D 25 MCG (1000 UNIT) PO TABS
2000.0000 [IU] | ORAL_TABLET | Freq: Two times a day (BID) | ORAL | Status: DC
  Administered 2024-09-20 – 2024-09-22 (×4): 2000 [IU] via ORAL
  Filled 2024-09-20 (×5): qty 2

## 2024-09-20 NOTE — Anesthesia Postprocedure Evaluation (Signed)
 Anesthesia Post Note  Patient: Ryan Lynch  Procedure(s) Performed: FIXATION, FRACTURE, INTERTROCHANTERIC, WITH INTRAMEDULLARY ROD (Left: Hip)     Patient location during evaluation: PACU Anesthesia Type: General Level of consciousness: sedated and patient cooperative Pain management: pain level controlled Vital Signs Assessment: post-procedure vital signs reviewed and stable Respiratory status: spontaneous breathing Cardiovascular status: stable Anesthetic complications: no   No notable events documented.  Last Vitals:  Vitals:   09/20/24 0729 09/20/24 1442  BP: (!) 143/95 130/88  Pulse: (!) 102 (!) 108  Resp: 17 17  Temp: 36.9 C 36.9 C  SpO2: 99% 94%    Last Pain:  Vitals:   09/20/24 1847  TempSrc:   PainSc: 6                  Norleen Pope

## 2024-09-20 NOTE — Evaluation (Signed)
 Physical Therapy Evaluation Patient Details Name: Ryan Lynch MRN: [id number] DOB: 06-Apr-1981 Today's Date: 09/20/2024  History of Present Illness  Pt is a 43 y.o. male admitted 09/18/24 after mechanical fall at home, sustaining a left acute comminuted displaced and impacted intertrochanteric fracture of the proximal femur with a laterally displaced distal fragment and displaced lesser trochanter fracture fragment. Pt s/p IM nail 11/6. PMHx: remote alcohol abuse, psoriasis, chronic thrombocytopenia, and ADHD.   Clinical Impression  Pt admitted with above diagnosis. PTA, pt was independent with functional mobility, ADLs/IADLs, and driving. He is planning to recover at his parent's house where he can reside on the main level and will have 3 STE. Pt currently with functional limitations due to the deficits listed below (see PT Problem List). He performed supine>sit with supervision and required CGA for sit<>stand using RW and CGA x2 for gait using RW. Pt ambulated a short-distance with a hopping technique opting to maintain LLE NWB. He is currently limited by L hip pain, decreased balance, and impaired activity tolerance. Pt was tachycardic throughout session with HR resting at 122bpm increasing to 130-140s during gait with a max of 155bpm on the first gait bout and 170bpm on the second gait bout. Pt was able to recover to the mid-130s with ~37min seated rest break. Pt will benefit from acute skilled PT to increase his independence and safety with mobility to allow discharge. Recommend HHPT to increase ROM/strength, improve balance, decrease fall risk, advance cardiopulmonary endurance, and optimize safety within the home environment.      If plan is discharge home, recommend the following: A little help with walking and/or transfers;A little help with bathing/dressing/bathroom;Assistance with cooking/housework;Assist for transportation;Help with stairs or ramp for entrance   Can travel by private vehicle         Equipment Recommendations None recommended by PT  Recommendations for Other Services       Functional Status Assessment Patient has had a recent decline in their functional status and demonstrates the ability to make significant improvements in function in a reasonable and predictable amount of time.     Precautions / Restrictions Precautions Precautions: Fall Recall of Precautions/Restrictions: Intact Precaution/Restrictions Comments: Watch HR Restrictions Weight Bearing Restrictions Per Provider Order: Yes LLE Weight Bearing Per Provider Order: Weight bearing as tolerated Other Position/Activity Restrictions: Unrestricted range of motion left hip and knee      Mobility  Bed Mobility Overal bed mobility: Needs Assistance Bed Mobility: Supine to Sit     Supine to sit: Supervision, HOB elevated     General bed mobility comments: Pt sat up on L side of bed with increased time. He supported LLE with BUE. Cued pt on use of R foot to hook and support L leg.    Transfers Overall transfer level: Needs assistance Equipment used: Rolling walker (2 wheels) Transfers: Sit to/from Stand Sit to Stand: Contact guard assist           General transfer comment: Introduced RW, educated pt on proper and safe use of AD. Cued hand/foot placement and sequencing. Powered up with light assist. Good eccentric control.    Ambulation/Gait Ambulation/Gait assistance: Contact guard assist, +2 safety/equipment (Chair Follow) Gait Distance (Feet): 30 Feet (x2, seated rest break between bouts) Assistive device: Rolling walker (2 wheels) Gait Pattern/deviations: Step-to pattern Gait velocity: decreased     General Gait Details: Pt ambulated using a hopping technique. He opted to maintain LLE NWB at all times d/t pain. Heavy reliance on BUE support on RW.  Pt maintained upright posture and good proximity to RW. Facilitated seated rest break d/t fatigue, onset of heavy sweat, and pt c/o  nausea.  Stairs            Wheelchair Mobility     Tilt Bed    Modified Rankin (Stroke Patients Only)       Balance Overall balance assessment: Needs assistance Sitting-balance support: No upper extremity supported, Feet supported Sitting balance-Leahy Scale: Good     Standing balance support: Bilateral upper extremity supported, During functional activity Standing balance-Leahy Scale: Poor Standing balance comment: Pt dependent on RW                             Pertinent Vitals/Pain Pain Assessment Pain Assessment: Faces Faces Pain Scale: Hurts a little bit Pain Location: L hip Pain Descriptors / Indicators: Operative site guarding, Discomfort, Aching, Sore Pain Intervention(s): Monitored during session, Limited activity within patient's tolerance, Repositioned    Home Living Family/patient expects to be discharged to:: Private residence Living Arrangements: Parent Available Help at Discharge: Family;Available 24 hours/day Type of Home: House Home Access: Stairs to enter Entrance Stairs-Rails: Right;Left;Can reach both Entrance Stairs-Number of Steps: 3   Home Layout: Two level;Able to live on main level with bedroom/bathroom Home Equipment: Shower seat;Rolling Walker (2 wheels);Crutches;Cane - single point;BSC/3in1 Additional Comments: Pt intends to stay at his parent's house at d/c. The above information is for their place.    Prior Function Prior Level of Function : Independent/Modified Independent;Driving             Mobility Comments: Ambualtes without AD. 1 fall leading to admit, slipped on wet floor ADLs Comments: Indep     Extremity/Trunk Assessment   Upper Extremity Assessment Upper Extremity Assessment: Defer to OT evaluation    Lower Extremity Assessment Lower Extremity Assessment: LLE deficits/detail LLE Deficits / Details: Pt POD 1 s/p femur IM nail. Decreased hip and knee AROM. Grossly 3-/5 strength. LLE: Unable to  fully assess due to pain LLE Sensation: WNL LLE Coordination: decreased gross motor    Cervical / Trunk Assessment Cervical / Trunk Assessment: Normal  Communication   Communication Communication: No apparent difficulties    Cognition Arousal: Alert Behavior During Therapy: WFL for tasks assessed/performed   PT - Cognitive impairments: No apparent impairments                       PT - Cognition Comments: Pt A,Ox4 Following commands: Intact       Cueing Cueing Techniques: Verbal cues     General Comments General comments (skin integrity, edema, etc.): Pt tachycardic throughout session. HR at rest 122bpm, upon standing up 130s, during gait 130-140s with max of 155bpm on the first bout and 170bpm on second bout. MD entered room, notified of pt's HR response to activity.    Exercises     Assessment/Plan    PT Assessment Patient needs continued PT services  PT Problem List Decreased strength;Decreased range of motion;Decreased activity tolerance;Decreased balance;Decreased mobility;Decreased knowledge of use of DME;Decreased safety awareness       PT Treatment Interventions DME instruction;Gait training;Stair training;Functional mobility training;Therapeutic activities;Therapeutic exercise;Patient/family education    PT Goals (Current goals can be found in the Care Plan section)  Acute Rehab PT Goals Patient Stated Goal: Return Home and feel better PT Goal Formulation: With patient Time For Goal Achievement: 10/04/24 Potential to Achieve Goals: Good    Frequency Min 2X/week  Co-evaluation               AM-PAC PT 6 Clicks Mobility  Outcome Measure Help needed turning from your back to your side while in a flat bed without using bedrails?: A Little Help needed moving from lying on your back to sitting on the side of a flat bed without using bedrails?: A Little Help needed moving to and from a bed to a chair (including a wheelchair)?: A  Little Help needed standing up from a chair using your arms (e.g., wheelchair or bedside chair)?: A Little Help needed to walk in hospital room?: A Little Help needed climbing 3-5 steps with a railing? : A Lot 6 Click Score: 17    End of Session Equipment Utilized During Treatment: Gait belt Activity Tolerance: Patient tolerated treatment well;Patient limited by pain;Patient limited by fatigue;Other (comment) (Treatment limited secondary to tachycardia) Patient left: in bed;with call bell/phone within reach;with bed alarm set Nurse Communication: Mobility status;Other (comment) (HR response) PT Visit Diagnosis: Difficulty in walking, not elsewhere classified (R26.2);Other abnormalities of gait and mobility (R26.89);Unsteadiness on feet (R26.81)    Time: 8493-8472 PT Time Calculation (min) (ACUTE ONLY): 21 min   Charges:   PT Evaluation $PT Eval Moderate Complexity: 1 Mod   PT General Charges $$ ACUTE PT VISIT: 1 Visit         Randall SAUNDERS, PT, DPT Acute Rehabilitation Services Office: [phone] Secure Chat Preferred  Delon CHRISTELLA Callander 09/20/2024, 4:13 PM

## 2024-09-20 NOTE — Progress Notes (Addendum)
 Orthopaedic Trauma Service Progress Note  Patient ID: Ryan Lynch MRN: [id number] DOB/AGE: Nov 20, 1980 43 y.o.  Subjective:  Looks good this am  States his pain is well controlled  No specific complaints noted   Parents in the room   Ammonia slightly elevated   Wonder if his psychomotor agitation is caused by his Delta-8 usage and contributing to lab abnormalities   Urine output is marginal, continue to monitor   ROS As above  Today's  total administered Morphine  Milligram Equivalents: 16 Yesterday's total administered Morphine  Milligram Equivalents: 86  Objective:   VITALS:   Vitals:   09/19/24 1520 09/19/24 1925 09/20/24 0343 09/20/24 0729  BP: 118/83 127/80 128/88 (!) 143/95  Pulse: 99 (!) 103 (!) 104 (!) 102  Resp: 17 18 18 17   Temp: 98.3 F (36.8 C) 98 F (36.7 C) 98.7 F (37.1 C) 98.5 F (36.9 C)  TempSrc: Oral     SpO2: 97% 97% 96% 99%  Weight:      Height:        Estimated body mass index is 24.39 kg/m as calculated from the following:   Height as of this encounter: 5' 10 (1.778 m).   Weight as of this encounter: 77.1 kg.   Intake/Output      11/06 0701 11/07 0700 11/07 0701 11/08 0700   P.O.     I.V. (mL/kg) 900 (11.7)    IV Piggyback 117.2    Total Intake(mL/kg) 1017.2 (13.2)    Urine (mL/kg/hr) 725 (0.4)    Blood 40    Total Output 765    Net +252.2           LABS  Results for orders placed or performed during the hospital encounter of 09/18/24 (from the past 24 hours)  VITAMIN D  25 Hydroxy (Vit-D Deficiency, Fractures)     Status: None   Collection Time: 09/20/24  3:20 AM  Result Value Ref Range   Vit D, 25-Hydroxy 35.76 30 - 100 ng/mL  CBC     Status: Abnormal   Collection Time: 09/20/24  3:20 AM  Result Value Ref Range   WBC 5.4 4.0 - 10.5 K/uL   RBC 3.07 (L) 4.22 - 5.81 MIL/uL   Hemoglobin 9.8 (L) 13.0 - 17.0 g/dL   HCT 71.1 (L) 60.9 - 47.9 %   MCV  93.8 80.0 - 100.0 fL   MCH 31.9 26.0 - 34.0 pg   MCHC 34.0 30.0 - 36.0 g/dL   RDW 85.7 88.4 - 84.4 %   Platelets 112 (L) 150 - 400 K/uL   nRBC 0.0 0.0 - 0.2 %  Comprehensive metabolic panel     Status: Abnormal   Collection Time: 09/20/24  3:20 AM  Result Value Ref Range   Sodium 138 135 - 145 mmol/L   Potassium 3.8 3.5 - 5.1 mmol/L   Chloride 100 98 - 111 mmol/L   CO2 26 22 - 32 mmol/L   Glucose, Bld 106 (H) 70 - 99 mg/dL   BUN 11 6 - 20 mg/dL   Creatinine, Ser 9.04 0.61 - 1.24 mg/dL   Calcium 8.3 (L) 8.9 - 10.3 mg/dL   Total Protein 5.4 (L) 6.5 - 8.1 g/dL   Albumin  3.0 (L) 3.5 - 5.0 g/dL   AST 21 15 - 41 U/L   ALT 16 0 -  44 U/L   Alkaline Phosphatase 44 38 - 126 U/L   Total Bilirubin 0.9 0.0 - 1.2 mg/dL   GFR, Estimated >39 >39 mL/min   Anion gap 12 5 - 15  Ammonia     Status: Abnormal   Collection Time: 09/20/24  3:20 AM  Result Value Ref Range   Ammonia 42 (H) 9 - 35 umol/L  Phosphorus     Status: None   Collection Time: 09/20/24  3:20 AM  Result Value Ref Range   Phosphorus 3.2 2.5 - 4.6 mg/dL     PHYSICAL EXAM:   Hzw:obpwh comfortably in bed, NAD, appears less agitated today  Lungs: unlabored  Cardiac: reg Ext:       Left Lower Extremity  Length and rotation symmetric to contralateral side  Dressing is intact, fair amount of dried bloody drainage noted mid-thigh  Extremity is warm  No DCT  Compartments are soft  No pain out of proportion with passive stretching of toes or ankle  DPN, SPN, TN sensory functions are intact  EHL, FHL, lesser toe motor functions intact  Ankle flexion, extension, inversion eversion intact  + DP pulse    Assessment/Plan: 1 Day Post-Op   Principal Problem:   Femur fracture ([name])   Anti-infectives (From admission, onward)    Start     Dose/Rate Route Frequency Ordered Stop   09/19/24 1830  ceFAZolin  (ANCEF ) IVPB 2g/100 mL premix        2 g 200 mL/hr over 30 Minutes Intravenous Every 6 hours 09/19/24 1515 09/20/24 0700    09/19/24 1100  ceFAZolin  (ANCEF ) IVPB 2g/100 mL premix        2 g 200 mL/hr over 30 Minutes Intravenous On call to O.R. 09/19/24 0859 09/19/24 1238   09/18/24 2200  amoxicillin-clavulanate (AUGMENTIN) 875-125 MG per tablet 1 tablet        1 tablet Oral Every 12 hours 09/18/24 1602 09/23/24 2159     .  POD/HD#: 64  43 year old male ground-level fall with left hip fracture   -Ground-level fall   -Left three-part intertrochanteric hip fracture s/p IMN             Weight-bear as tolerated left leg with assistance  No range of motion restrictions left hip or knee  Therapy evaluations  Dressing changes as needed starting tomorrow    Ice and elevate                 This is now patient's third fracture in 3 years.  Left tibial plateau fracture 2023 resulted after he fell down some stairs, right proximal humerus fracture from last year occurred after he tripped over a building sign and this most recent left hip fracture was for him slipping on a wet floor.  This is very odd for someone of his age.  In addition to vitamin D  panel and also obtain TSH, PTH and testosterone panel.    - Pain management:               Multimodal    - ABL anemia/Hemodynamics               Monitor                Cbc in am   - Medical issues                Per primary    - DVT/PE prophylaxis:  Sq heparin                DOAC x 30 days at DC    - ID:                Periop abx   - Metabolic Bone Disease:               Labs as noted above               Would argue that all of his fractures are fragility fracture                At this rate I think he needs a DEXA given history of 3 fractures from falls in 3 years    Testosterone pending      Vitamin d  within normal limits would still supplement 5000 IUs D3 daily (ideally with vitamin k2 in it 100-200 mcg)   - Activity:               As above   - FEN/GI prophylaxis/Foley/Lines:               Reg diet   - Impediments to fracture  healing:               Serial low energy fractures                Polysubstance use    - Dispo:               Ortho issues stable  Likely ready for dc in 24-48 hours  Follow up with OTS in 10-14 days     Francis MICAEL Mt, PA-C [phone] (C) 09/20/2024, 10:53 AM  [facility] [address] [place] [address] [phone] (F)    After 5pm and on the weekends please log on to Amion, go to orthopaedics and the look under the [facility] Call for the provider(s) on call. You can also call our office at [phone] and then follow the prompts to be connected to the call team.  Patient ID: Ryan Lynch, male   DOB: 1981/01/09, 43 y.o.   MRN: [id number]

## 2024-09-20 NOTE — Telephone Encounter (Signed)
 Patient's father Ozell called to report that the patient is currently hospitalized due to a fall and hip fracture. He had surgery yesterday. Ozell states the patient has recent labs in his chart. His last dose of cosentyx  was on 07/11/2024 and he is aware the patient will need clearance from the surgeon prior to resuming cosentyx .

## 2024-09-20 NOTE — Progress Notes (Addendum)
 PROGRESS NOTE    Ryan Lynch  FMW:[id number] DOB: 11-Feb-1981 DOA: 09/18/2024 PCP: Patient, No Pcp Per   Brief Narrative:  43 year old male with PMHx remote alcohol abuse, psoriasis, chronic thrombocytopenia, and ADHD who presented to [name], ED on 09/18/2024 after mechanical fall at home.  Patient reportedly slipped on wet floor and fell onto his left hip.  In the ED, he was afebrile, tachycardic to 107, RR 18, BP 145/105, SpO2 199% on RA.  CBC, BMP, and HFP were largely unremarkable.  Ethanol negative.  X-ray of left femur showed acute comminuted displaced and impacted intertrochanteric fracture of the proximal femur with a laterally displaced distal fragment and displaced lesser trochanter fracture fragment.  Orthopedic surgery evaluated the patient and recommended admission for intramedullary nailing.   Assessment and Plan:  #Ground-level mechanical fall #Left intertrochanteric fracture s/p IMN (11/6) - Underwent IMN on 11/6 - WBAT w/ walker or crutches - PT/OT recommending home with home health PT 6 - Post op abx per ortho - Post op anticoagulation per Ortho  # Sinus tachycardia - Patient noted to be tachycardic to the 100s, low during the day - Noted to be tachycardic to 150s-160s while ambulating with PT, with associated nausea and diaphoresis - Improved with rest  #Urinary retention - Status postplacement of coude catheter  # Concern for osteoporosis  - 43 year old with history of 3 low trauma fractures in 3 years most likely consistent with fragility fractures concerning for secondary osteoporosis/metabolic bone disease - Reports a history of vitamin D  deficiency but not on supplementation recently - Calcium corrected to 9.0, alk phos 47, AST 22, ALT 20, Phos 3.2 - Vitamin D  low normal at 35 - PTH, free testosterone pending - Vitamin D3 2000 units twice daily  #Chronic thrombocytopenia - Appears stable - Follow-up with PCP for further workup  # Anxiety - Continue  home Zoloft  and Neurontin  DVT prophylaxis: SCDs Start: 09/19/24 1516 heparin injection 5,000 Units Start: 09/18/24 1615   Code Status:   Code Status: Full Code  Family Communication: None  Disposition Plan: PT OT recommending home with home health.  Could be possibly discharge tomorrow. PT Follow up Recs: Home Health Pt11/05/2024 1500  Level of care: Telemetry  Consultants:  Orthopedic surgery  Procedures:  Intramedullary nailing of left intertrochanteric hip fracture  Antimicrobials: None    Subjective: Patient examined at the end of his PT session, where he was noted to have become very tachycardic with heart rates in the 150s to 160s, diaphoretic, nauseous.  Still has some pain but has been manageable.  Eating and drinking well.  Objective: Vitals:   09/20/24 0343 09/20/24 0729 09/20/24 1442 09/20/24 1955  BP: 128/88 (!) 143/95 130/88 (!) 129/90  Pulse: (!) 104 (!) 102 (!) 108 (!) 108  Resp: 18 17 17 18   Temp: 98.7 F (37.1 C) 98.5 F (36.9 C) 98.4 F (36.9 C) 100.2 F (37.9 C)  TempSrc:   Rectal Rectal  SpO2: 96% 99% 94% 97%  Weight:      Height:        Intake/Output Summary (Last 24 hours) at 09/20/2024 2210 Last data filed at 09/20/2024 1955 Gross per 24 hour  Intake 480 ml  Output 1520 ml  Net -1040 ml   Filed Weights   09/18/24 1307 09/19/24 1005  Weight: 77.1 kg 77.1 kg    Examination:  General exam: Appeared anxious, diaphoretic, and winded after ambulating with PT Respiratory system: Clear to auscultation. Respiratory effort normal. Cardiovascular system: S1 &  S2 heard, tachycardic, regular rhythm. No JVD, murmurs. No pedal edema. Gastrointestinal system: Abdomen is nondistended, soft and nontender. No organomegaly or masses felt. Normal bowel sounds heard. Central nervous system: Alert and oriented. No focal neurological deficits. Extremities: Symmetric 5 x 5 power. LLE hip dressing in place. Neurovascularly intact distally Skin: No rashes,  lesions or ulcers Psychiatry: Judgement and insight appear normal. Mood & affect appropriate.    Data Reviewed: I have personally reviewed following labs and imaging studies  CBC: Recent Labs  Lab 09/18/24 1350 09/18/24 1648 09/19/24 0910 09/20/24 0320  WBC 4.9 7.6 4.8 5.4  NEUTROABS 3.9  --   --   --   HGB 13.3 13.5 11.6* 9.8*  HCT 37.8* 39.9 34.6* 28.8*  MCV 91.7 92.4 94.8 93.8  PLT 129* 148* 111* 112*   Basic Metabolic Panel: Recent Labs  Lab 09/18/24 1350 09/18/24 1648 09/19/24 0910 09/20/24 0320  NA 140  --  142 138  K 4.2  --  4.0 3.8  CL 104  --  109 100  CO2 23  --  21* 26  GLUCOSE 135*  --  102* 106*  BUN 5*  --  8 11  CREATININE 0.93 1.01 0.98 0.95  CALCIUM 8.9  --  8.4* 8.3*  PHOS  --   --   --  3.2   GFR: Estimated Creatinine Clearance: 101.4 mL/min (by C-G formula based on SCr of 0.95 mg/dL). Liver Function Tests: Recent Labs  Lab 09/18/24 1648 09/19/24 0910 09/20/24 0320  AST 28 22 21   ALT 21 20 16   ALKPHOS 53 47 44  BILITOT 0.9 1.4* 0.9  PROT 6.4* 5.6* 5.4*  ALBUMIN  3.9 3.3* 3.0*   No results for input(s): LIPASE, AMYLASE in the last 168 hours. Recent Labs  Lab 09/20/24 0320  AMMONIA 42*   Coagulation Profile: Recent Labs  Lab 09/18/24 1648  INR 1.1   Cardiac Enzymes: Recent Labs  Lab 09/19/24 0910  CKTOTAL 110   BNP (last 3 results) No results for input(s): PROBNP in the last 8760 hours. HbA1C: No results for input(s): HGBA1C in the last 72 hours. CBG: No results for input(s): GLUCAP in the last 168 hours. Lipid Profile: No results for input(s): CHOL, HDL, LDLCALC, TRIG, CHOLHDL, LDLDIRECT in the last 72 hours. Thyroid Function Tests: No results for input(s): TSH, T4TOTAL, FREET4, T3FREE, THYROIDAB in the last 72 hours. Anemia Panel: Recent Labs    09/20/24 0319  VITAMINB12 175*  FOLATE 9.2  FERRITIN 213  TIBC 256  IRON 25*  RETICCTPCT 3.3*   Sepsis Labs: No results for  input(s): PROCALCITON, LATICACIDVEN in the last 168 hours.  Recent Results (from the past 240 hours)  Surgical pcr screen     Status: Abnormal   Collection Time: 09/19/24  4:03 AM   Specimen: Nasal Mucosa; Nasal Swab  Result Value Ref Range Status   MRSA, PCR NEGATIVE NEGATIVE Final   Staphylococcus aureus POSITIVE (A) NEGATIVE Final    Comment: (NOTE) The Xpert SA Assay (FDA approved for NASAL specimens in patients 38 years of age and older), is one component of a comprehensive surveillance program. It is not intended to diagnose infection nor to guide or monitor treatment. Performed at [facility], [address], [place], [place] [address]      Radiology Studies: DG FEMUR PORT MIN 2 VIEWS LEFT Result Date: 09/19/2024 CLINICAL DATA:  Postop. EXAM: LEFT FEMUR PORTABLE 2 VIEWS COMPARISON:  Preoperative imaging FINDINGS: Femoral intramedullary nail with trans  trochanteric and distal locking screw fixation traverse proximal femur fracture. Improved fracture alignment from preoperative imaging. Recent postsurgical change includes air and edema in the soft tissues. Ghost tracks in the femoral shaft from prior external fixator. IMPRESSION: ORIF of proximal femur fracture, without immediate postoperative complication. Electronically Signed   By: Andrea Gasman M.D.   On: 09/19/2024 15:40   DG HIP UNILAT WITH PELVIS 2-3 VIEWS LEFT Result Date: 09/19/2024 CLINICAL DATA:  461500 Elective surgery 461500 EXAM: DG HIP (WITH OR WITHOUT PELVIS) 2-3V LEFT COMPARISON:  Preoperative imaging FINDINGS: Seven fluoroscopic spot views of the left hip submitted from the operating room. Femoral intramedullary nail with trans trochanteric and distal locking screw fixation traverse proximal femur fracture. Fluoroscopy time 1 minutes 3 seconds. Dose 14.08 mGy. IMPRESSION: Intraoperative fluoroscopy during left proximal femur fracture ORIF. Electronically Signed   By: Andrea Gasman M.D.   On: 09/19/2024  15:39   DG C-Arm 1-60 Min-No Report Result Date: 09/19/2024 Fluoroscopy was utilized by the requesting physician.  No radiographic interpretation.   DG C-Arm 1-60 Min-No Report Result Date: 09/19/2024 Fluoroscopy was utilized by the requesting physician.  No radiographic interpretation.    Scheduled Meds:  amoxicillin-clavulanate  1 tablet Oral Q12H   chlordiazePOXIDE   20 mg Oral TID   Chlorhexidine  Gluconate Cloth  6 each Topical Daily   Chlorhexidine  Gluconate Cloth  6 each Topical Daily   cholecalciferol   2,000 Units Oral BID   docusate sodium   100 mg Oral BID   folic acid   1 mg Oral Daily   gabapentin  600 mg Oral TID   heparin  5,000 Units Subcutaneous Q8H   multivitamin with minerals  1 tablet Oral Daily   mupirocin ointment  1 Application Nasal BID   sertraline   100 mg Oral Daily   thiamine   100 mg Oral Daily   Or   thiamine   100 mg Intravenous Daily   Continuous Infusions:     LOS:  LOS: 2 days   Time Spent: 35 minutes  Unresulted Labs (From admission, onward)     Start     Ordered   09/21/24 0500  CBC  Tomorrow morning,   R        09/20/24 1115   09/20/24 0500  Parathyroid hormone, intact (no Ca)  Tomorrow morning,   R        09/19/24 2301   09/20/24 0500  Testosterone, free  Tomorrow morning,   R        09/19/24 2301             Alzena Gerber Al-Sultani, MD [facility]  If 7PM-7AM, please contact night-coverage  09/20/2024, 10:10 PM

## 2024-09-20 NOTE — Evaluation (Signed)
 Occupational Therapy Evaluation Patient Details Name: Ryan Lynch MRN: [id number] DOB: 1981-06-04 Today's Date: 09/20/2024   History of Present Illness   Pt is a 43 y.o. male admitted 09/18/24 after mechanical fall at home, sustaining a left acute comminuted displaced and impacted intertrochanteric fracture of the proximal femur with a laterally displaced distal fragment and displaced lesser trochanter fracture fragment. Pt s/p IM nail 11/6. PMHx: remote alcohol abuse, psoriasis, chronic thrombocytopenia, and ADHD.     Clinical Impressions Pt resting in bed comfortably, c/o L hip pain and fatigue with exertion. Pt staying at parents during recovery, 3 STE, parents available 24/7 for support. PLOF independent. Pt currently requires min A for LB ADLs, set up/CGA for all other ADLs and mobility with RW. Pt HR quickly increases with minimal activity, Pt sweating and fatigued after short ambulation with RW, CGA, and HR did not improve after several minutes of rest, BP 145/80 after activity in supine. Pt would benefit from continued acute OT to maximize activity tolerance, no OT follow up expected.      If plan is discharge home, recommend the following:   A little help with walking and/or transfers;A little help with bathing/dressing/bathroom;Assistance with cooking/housework;Help with stairs or ramp for entrance;Assist for transportation     Functional Status Assessment   Patient has had a recent decline in their functional status and demonstrates the ability to make significant improvements in function in a reasonable and predictable amount of time.     Equipment Recommendations   None recommended by OT     Recommendations for Other Services         Precautions/Restrictions   Precautions Precautions: Fall Recall of Precautions/Restrictions: Intact Restrictions Weight Bearing Restrictions Per Provider Order: Yes LLE Weight Bearing Per Provider Order: Non weight bearing      Mobility Bed Mobility Overal bed mobility: Needs Assistance Bed Mobility: Supine to Sit, Sit to Supine     Supine to sit: Supervision Sit to supine: Min assist   General bed mobility comments: able to sit EOB with increased effort/time, min A for return to bed for LLE    Transfers Overall transfer level: Needs assistance Equipment used: Rolling walker (2 wheels) Transfers: Sit to/from Stand Sit to Stand: Contact guard assist           General transfer comment: CGA with RW for safety      Balance Overall balance assessment: Needs assistance Sitting-balance support: No upper extremity supported, Feet supported Sitting balance-Leahy Scale: Good     Standing balance support: Bilateral upper extremity supported, During functional activity Standing balance-Leahy Scale: Poor Standing balance comment: reliant on RW for support                           ADL either performed or assessed with clinical judgement   ADL Overall ADL's : Needs assistance/impaired Eating/Feeding: Independent   Grooming: Set up;Contact guard assist;Sitting;Standing   Upper Body Bathing: Set up;Sitting   Lower Body Bathing: Minimal assistance;Sitting/lateral leans;Sit to/from stand   Upper Body Dressing : Set up;Sitting   Lower Body Dressing: Minimal assistance;Sitting/lateral leans;Sit to/from stand   Toilet Transfer: Contact guard assist;Rolling walker (2 wheels)   Toileting- Clothing Manipulation and Hygiene: Set up;Sit to/from stand;Sitting/lateral lean       Functional mobility during ADLs: Contact guard assist;Rolling walker (2 wheels) General ADL Comments: Pt doing well, min A for LB dressing/bathing, assist for LLE. Pt CGA for mobility with RW, due to tachycardia Pt  limited with OOB activity tolerance     Vision Baseline Vision/History: 0 No visual deficits Ability to See in Adequate Light: 0 Adequate Patient Visual Report: No change from baseline       Perception          Praxis         Pertinent Vitals/Pain Pain Assessment Pain Assessment: 0-10 Pain Score: 4  Pain Location: L hip Pain Descriptors / Indicators: Aching, Constant, Discomfort Pain Intervention(s): Monitored during session     Extremity/Trunk Assessment Upper Extremity Assessment Upper Extremity Assessment: Overall WFL for tasks assessed           Communication Communication Communication: No apparent difficulties   Cognition Arousal: Alert Behavior During Therapy: WFL for tasks assessed/performed Cognition: No apparent impairments                               Following commands: Intact       Cueing  General Comments   Cueing Techniques: Verbal cues  Pt HR quickly up to 140 when attempted to get to EOB, ambulated 40 feet, returned to bed and after several minutes till ~120, Pt sweating, BP 145/80.   Exercises     Shoulder Instructions      Home Living Family/patient expects to be discharged to:: Private residence Living Arrangements: Parent Available Help at Discharge: Family;Available 24 hours/day Type of Home: House Home Access: Stairs to enter [employer] of Steps: 3 Entrance Stairs-Rails: Can reach both Home Layout: Two level;Able to live on main level with bedroom/bathroom     Bathroom Shower/Tub: Walk-in shower         Home Equipment: Pharmacist, Hospital (2 wheels);Crutches;Cane - single point;BSC/3in1   Additional Comments: Pt going to stay with parents who are available 24/7      Prior Functioning/Environment Prior Level of Function : Independent/Modified Independent                    OT Problem List: Decreased strength;Decreased range of motion;Decreased activity tolerance;Impaired balance (sitting and/or standing);Pain   OT Treatment/Interventions: Self-care/ADL training;Therapeutic exercise;Energy conservation;DME and/or AE instruction;Therapeutic activities;Balance training;Patient/family  education      OT Goals(Current goals can be found in the care plan section)   Acute Rehab OT Goals Patient Stated Goal: to manage pain OT Goal Formulation: With patient Time For Goal Achievement: 10/04/24 Potential to Achieve Goals: Good   OT Frequency:  Min 2X/week    Co-evaluation              AM-PAC OT 6 Clicks Daily Activity     Outcome Measure Help from another person eating meals?: None Help from another person taking care of personal grooming?: A Little Help from another person toileting, which includes using toliet, bedpan, or urinal?: A Little Help from another person bathing (including washing, rinsing, drying)?: A Little Help from another person to put on and taking off regular upper body clothing?: A Little Help from another person to put on and taking off regular lower body clothing?: A Little 6 Click Score: 19   End of Session Equipment Utilized During Treatment: Gait belt;Rolling walker (2 wheels) Nurse Communication: Mobility status  Activity Tolerance: Patient limited by fatigue Patient left: in bed;with call bell/phone within reach;with bed alarm set  OT Visit Diagnosis: Unsteadiness on feet (R26.81);Other abnormalities of gait and mobility (R26.89);Muscle weakness (generalized) (M62.81);Pain Pain - Right/Left: Left Pain - part of body: Hip  Time: 8878-8854 OT Time Calculation (min): 24 min Charges:  OT General Charges $OT Visit: 1 Visit OT Evaluation $OT Eval Low Complexity: 1 Low OT Treatments $Self Care/Home Management : 8-22 mins  Ashley, OTR/L   Elouise JONELLE Bott 09/20/2024, 12:32 PM

## 2024-09-21 ENCOUNTER — Other Ambulatory Visit (HOSPITAL_COMMUNITY): Payer: Self-pay

## 2024-09-21 LAB — HEPATIC FUNCTION PANEL
ALT: 18 U/L (ref 0–44)
AST: 32 U/L (ref 15–41)
Albumin: 3.1 g/dL — ABNORMAL LOW (ref 3.5–5.0)
Alkaline Phosphatase: 65 U/L (ref 38–126)
Bilirubin, Direct: 0.4 mg/dL — ABNORMAL HIGH (ref 0.0–0.2)
Indirect Bilirubin: 1.2 mg/dL — ABNORMAL HIGH (ref 0.3–0.9)
Total Bilirubin: 1.6 mg/dL — ABNORMAL HIGH (ref 0.0–1.2)
Total Protein: 6.2 g/dL — ABNORMAL LOW (ref 6.5–8.1)

## 2024-09-21 LAB — RETICULOCYTES
Immature Retic Fract: 18.8 % — ABNORMAL HIGH (ref 2.3–15.9)
RBC.: 3.28 MIL/uL — ABNORMAL LOW (ref 4.22–5.81)
Retic Count, Absolute: 155.5 K/uL (ref 19.0–186.0)
Retic Ct Pct: 4.7 % — ABNORMAL HIGH (ref 0.4–3.1)

## 2024-09-21 LAB — CBC
HCT: 24.1 % — ABNORMAL LOW (ref 39.0–52.0)
Hemoglobin: 8.3 g/dL — ABNORMAL LOW (ref 13.0–17.0)
MCH: 31.9 pg (ref 26.0–34.0)
MCHC: 34.4 g/dL (ref 30.0–36.0)
MCV: 92.7 fL (ref 80.0–100.0)
Platelets: 102 K/uL — ABNORMAL LOW (ref 150–400)
RBC: 2.6 MIL/uL — ABNORMAL LOW (ref 4.22–5.81)
RDW: 14.6 % (ref 11.5–15.5)
WBC: 4.1 K/uL (ref 4.0–10.5)
nRBC: 0 % (ref 0.0–0.2)

## 2024-09-21 LAB — LACTATE DEHYDROGENASE: LDH: 155 U/L (ref 98–192)

## 2024-09-21 LAB — PARATHYROID HORMONE, INTACT (NO CA): PTH: 28 pg/mL (ref 15–65)

## 2024-09-21 LAB — TESTOSTERONE, FREE: Testosterone, Free: 16 pg/mL (ref 6.8–21.5)

## 2024-09-21 MED ORDER — VITAMIN D3 125 MCG (5000 UT) PO TABS
ORAL_TABLET | Freq: Every day | ORAL | 6 refills | Status: AC
  Filled 2024-09-21: qty 30, 30d supply, fill #0

## 2024-09-21 MED ORDER — METHOCARBAMOL 750 MG PO TABS
750.0000 mg | ORAL_TABLET | Freq: Three times a day (TID) | ORAL | 0 refills | Status: AC | PRN
  Filled 2024-09-21: qty 40, 14d supply, fill #0

## 2024-09-21 MED ORDER — MUPIROCIN 2 % EX OINT
1.0000 | TOPICAL_OINTMENT | Freq: Two times a day (BID) | CUTANEOUS | 0 refills | Status: AC
  Filled 2024-09-21: qty 44, 22d supply, fill #0
  Filled 2024-09-21: qty 60, 30d supply, fill #0

## 2024-09-21 MED ORDER — APIXABAN 2.5 MG PO TABS
2.5000 mg | ORAL_TABLET | Freq: Two times a day (BID) | ORAL | 0 refills | Status: AC
  Filled 2024-09-21: qty 60, 30d supply, fill #0

## 2024-09-21 MED ORDER — CYANOCOBALAMIN 1000 MCG/ML IJ SOLN
1000.0000 ug | Freq: Every day | INTRAMUSCULAR | Status: AC
  Administered 2024-09-21 – 2024-09-22 (×2): 1000 ug via INTRAMUSCULAR
  Filled 2024-09-21 (×3): qty 1

## 2024-09-21 MED ORDER — HYDROCODONE-ACETAMINOPHEN 5-325 MG PO TABS
1.0000 | ORAL_TABLET | Freq: Three times a day (TID) | ORAL | 0 refills | Status: AC | PRN
  Filled 2024-09-21: qty 40, 7d supply, fill #0

## 2024-09-21 MED ORDER — CHLORHEXIDINE GLUCONATE 4 % EX SOLN
1.0000 | CUTANEOUS | 1 refills | Status: AC
  Filled 2024-09-21: qty 946, 42d supply, fill #0

## 2024-09-21 NOTE — Progress Notes (Signed)
 PROGRESS NOTE    Ryan Lynch  FMW:[id number] DOB: 1981/03/19 DOA: 09/18/2024 PCP: Patient, No Pcp Per   Brief Narrative:  43 year old male with PMHx remote alcohol abuse, psoriasis, chronic thrombocytopenia, and ADHD who presented to [name] ED on 09/18/2024 after mechanical fall at home.  Patient reportedly slipped on wet floor and fell onto his left hip.  In the ED, he was afebrile, tachycardic to 107, RR 18, BP 145/105, SpO2 199% on RA.  CBC, BMP, and HFP were largely unremarkable.  Ethanol negative.  X-ray of left femur showed acute comminuted displaced and impacted intertrochanteric fracture of the proximal femur with a laterally displaced distal fragment and displaced lesser trochanter fracture fragment.  Orthopedic surgery evaluated the patient and recommended admission for intramedullary nailing.   Assessment and Plan:  #Ground-level mechanical fall #Left intertrochanteric fracture s/p IMN (11/6) - Underwent IMN on 11/6 - WBAT w/ walker or crutches - PT/OT recommending home with home health PT  - Post op abx per ortho - Post op anticoagulation per Ortho  # Acute post-operative blood loss anemia - Hgb 8.3, down from 13.5 pre-op - Iron studies altered by acute inflammation (Fe 25, TIBC 256, Fe sat 10%, ferritin 213) - No obvious bleeding at surgical site - FOBT ordered to rule out occult GI bleed, though unlikely - Transfuse for Hgb < 7 - Continue to monitor Hgb and s/s bleeding  # Mild indirect hyperbilirubinemia  - T. bili 1.6, indirect 1.2, normal AST/ALT/ALP, normal LDH - Likely secondary to postop hematoma resorption versus transient stress response - Monitoring for now  #Mild B12 deficiency - B12 175 - IM cyanocobalamin 1000 mcg daily while hospitalized - Transition to p.o. at discharge - Will need to recheck vitamin B12 levels in 4 to 6 weeks  # Sinus tachycardia - Patient noted to be tachycardic to the low 100s during the day - Denies episodes of tachycardia  and diaphoresis on ambulation today - Improved with rest  #Urinary retention - Status postplacement of coude catheter on 11/7 - Discontinued catheter today, voiding trial after 6 hours  # Concern for osteoporosis  - 43 year old with history of 3 low trauma fractures in 3 years most likely consistent with fragility fractures concerning for secondary osteoporosis/metabolic bone disease - Reports a history of vitamin D  deficiency but not on supplementation recently - Calcium corrected to 9.0, alk phos 47, AST 22, ALT 20, Phos 3.2 - Vitamin D  low normal at 35 - PTH, free testosterone pending - Vitamin D3 2000 units twice daily  #Chronic thrombocytopenia - Relatively stable, slight decrease likely consumptive postoperatively - Follow-up with PCP for further workup  # Anxiety - Continue home Zoloft  and Neurontin  DVT prophylaxis: SCDs Start: 09/19/24 1516 heparin injection 5,000 Units Start: 09/18/24 1615   Code Status:   Code Status: Full Code  Family Communication: None  Disposition Plan: PT OT recommending home with home health.  Could be possibly discharge tomorrow. PT Follow up Recs: Home Health Pt11/05/2024 1500  Level of care: Telemetry  Consultants:  Orthopedic surgery  Procedures:  Intramedullary nailing of left intertrochanteric hip fracture  Antimicrobials: None    Subjective: Patient examined at bedside.  Doing much better today.  Good p.o. intake and bowel movements.  Ready for removal of catheter and voiding trial.  No episodes of tachycardia or diaphoresis on ambulation today.  Objective: Vitals:   09/20/24 1442 09/20/24 1955 09/21/24 0442 09/21/24 0718  BP: 130/88 (!) 129/90 128/87 (!) 137/90  Pulse: (!) 108 ROLLEN)  108 98 (!) 104  Resp: 17 18 18 18   Temp: 98.4 F (36.9 C) 100.2 F (37.9 C) 98.7 F (37.1 C) 98.8 F (37.1 C)  TempSrc: Rectal Rectal    SpO2: 94% 97% 92% 100%  Weight:      Height:        Intake/Output Summary (Last 24 hours) at  09/21/2024 1122 Last data filed at 09/21/2024 0500 Gross per 24 hour  Intake 240 ml  Output 1895 ml  Net -1655 ml   Filed Weights   09/18/24 1307 09/19/24 1005  Weight: 77.1 kg 77.1 kg    Examination:  General exam: Appeared anxious, diaphoretic, and winded after ambulating with PT Respiratory system: Clear to auscultation. Respiratory effort normal. Cardiovascular system: S1 & S2 heard, tachycardic, regular rhythm. No JVD, murmurs. No pedal edema. Gastrointestinal system: Abdomen is nondistended, soft and nontender. No organomegaly or masses felt. Normal bowel sounds heard. Central nervous system: Alert and oriented. No focal neurological deficits. Extremities: Symmetric 5 x 5 power. LLE hip dressing in place, c/d/i, no obvious signs of bleeding/hematoma. Neurovascularly intact distally Skin: No rashes, lesions or ulcers Psychiatry: Judgement and insight appear normal. Mood & affect appropriate.    Data Reviewed: I have personally reviewed following labs and imaging studies  CBC: Recent Labs  Lab 09/18/24 1350 09/18/24 1648 09/19/24 0910 09/20/24 0320 09/21/24 0612  WBC 4.9 7.6 4.8 5.4 4.1  NEUTROABS 3.9  --   --   --   --   HGB 13.3 13.5 11.6* 9.8* 8.3*  HCT 37.8* 39.9 34.6* 28.8* 24.1*  MCV 91.7 92.4 94.8 93.8 92.7  PLT 129* 148* 111* 112* 102*   Basic Metabolic Panel: Recent Labs  Lab 09/18/24 1350 09/18/24 1648 09/19/24 0910 09/20/24 0320  NA 140  --  142 138  K 4.2  --  4.0 3.8  CL 104  --  109 100  CO2 23  --  21* 26  GLUCOSE 135*  --  102* 106*  BUN 5*  --  8 11  CREATININE 0.93 1.01 0.98 0.95  CALCIUM 8.9  --  8.4* 8.3*  PHOS  --   --   --  3.2   GFR: Estimated Creatinine Clearance: 101.4 mL/min (by C-G formula based on SCr of 0.95 mg/dL). Liver Function Tests: Recent Labs  Lab 09/18/24 1648 09/19/24 0910 09/20/24 0320  AST 28 22 21   ALT 21 20 16   ALKPHOS 53 47 44  BILITOT 0.9 1.4* 0.9  PROT 6.4* 5.6* 5.4*  ALBUMIN  3.9 3.3* 3.0*   No  results for input(s): LIPASE, AMYLASE in the last 168 hours. Recent Labs  Lab 09/20/24 0320  AMMONIA 42*   Coagulation Profile: Recent Labs  Lab 09/18/24 1648  INR 1.1   Cardiac Enzymes: Recent Labs  Lab 09/19/24 0910  CKTOTAL 110   BNP (last 3 results) No results for input(s): PROBNP in the last 8760 hours. HbA1C: No results for input(s): HGBA1C in the last 72 hours. CBG: No results for input(s): GLUCAP in the last 168 hours. Lipid Profile: No results for input(s): CHOL, HDL, LDLCALC, TRIG, CHOLHDL, LDLDIRECT in the last 72 hours. Thyroid Function Tests: No results for input(s): TSH, T4TOTAL, FREET4, T3FREE, THYROIDAB in the last 72 hours. Anemia Panel: Recent Labs    09/20/24 0319  VITAMINB12 175*  FOLATE 9.2  FERRITIN 213  TIBC 256  IRON 25*  RETICCTPCT 3.3*   Sepsis Labs: No results for input(s): PROCALCITON, LATICACIDVEN in the last 168 hours.  Recent  Results (from the past 240 hours)  Surgical pcr screen     Status: Abnormal   Collection Time: 09/19/24  4:03 AM   Specimen: Nasal Mucosa; Nasal Swab  Result Value Ref Range Status   MRSA, PCR NEGATIVE NEGATIVE Final   Staphylococcus aureus POSITIVE (A) NEGATIVE Final    Comment: (NOTE) The Xpert SA Assay (FDA approved for NASAL specimens in patients 65 years of age and older), is one component of a comprehensive surveillance program. It is not intended to diagnose infection nor to guide or monitor treatment. Performed at [facility], [address], [place], [place] [address]      Radiology Studies: DG FEMUR PORT MIN 2 VIEWS LEFT Result Date: 09/19/2024 CLINICAL DATA:  Postop. EXAM: LEFT FEMUR PORTABLE 2 VIEWS COMPARISON:  Preoperative imaging FINDINGS: Femoral intramedullary nail with trans trochanteric and distal locking screw fixation traverse proximal femur fracture. Improved fracture alignment from preoperative imaging. Recent postsurgical change includes  air and edema in the soft tissues. Ghost tracks in the femoral shaft from prior external fixator. IMPRESSION: ORIF of proximal femur fracture, without immediate postoperative complication. Electronically Signed   By: Andrea Gasman M.D.   On: 09/19/2024 15:40   DG HIP UNILAT WITH PELVIS 2-3 VIEWS LEFT Result Date: 09/19/2024 CLINICAL DATA:  461500 Elective surgery 461500 EXAM: DG HIP (WITH OR WITHOUT PELVIS) 2-3V LEFT COMPARISON:  Preoperative imaging FINDINGS: Seven fluoroscopic spot views of the left hip submitted from the operating room. Femoral intramedullary nail with trans trochanteric and distal locking screw fixation traverse proximal femur fracture. Fluoroscopy time 1 minutes 3 seconds. Dose 14.08 mGy. IMPRESSION: Intraoperative fluoroscopy during left proximal femur fracture ORIF. Electronically Signed   By: Andrea Gasman M.D.   On: 09/19/2024 15:39   DG C-Arm 1-60 Min-No Report Result Date: 09/19/2024 Fluoroscopy was utilized by the requesting physician.  No radiographic interpretation.   DG C-Arm 1-60 Min-No Report Result Date: 09/19/2024 Fluoroscopy was utilized by the requesting physician.  No radiographic interpretation.    Scheduled Meds:  amoxicillin-clavulanate  1 tablet Oral Q12H   chlordiazePOXIDE   20 mg Oral TID   Chlorhexidine  Gluconate Cloth  6 each Topical Daily   Chlorhexidine  Gluconate Cloth  6 each Topical Daily   cholecalciferol   2,000 Units Oral BID   docusate sodium   100 mg Oral BID   folic acid   1 mg Oral Daily   gabapentin  600 mg Oral TID   heparin  5,000 Units Subcutaneous Q8H   multivitamin with minerals  1 tablet Oral Daily   mupirocin ointment  1 Application Nasal BID   sertraline   100 mg Oral Daily   thiamine   100 mg Oral Daily   Or   thiamine   100 mg Intravenous Daily   Continuous Infusions:     LOS:  LOS: 3 days   Time Spent: 40 minutes  Unresulted Labs (From admission, onward)     Start     Ordered   09/21/24 1120  Occult blood  card to lab, stool  Once,   R        09/21/24 1119   09/20/24 0500  Parathyroid hormone, intact (no Ca)  Tomorrow morning,   R        09/19/24 2301   09/20/24 0500  Testosterone, free  Tomorrow morning,   R        09/19/24 2301             Kymberlee Viger Al-Sultani, MD [facility]  If 7PM-7AM, please  contact night-coverage  09/21/2024, 11:22 AM

## 2024-09-21 NOTE — Discharge Instructions (Addendum)
 Orthopaedic Trauma Service Discharge Instructions   General Discharge Instructions  Orthopaedic Injuries:  Left hip fracture treated with intramedullary nailing  WEIGHT BEARING STATUS: Weightbearing as tolerated left leg with assistance  RANGE OF MOTION/ACTIVITY: Unrestricted range of motion of left hip and knee  Bone health: Vitamin D  levels look good.  Recommend taking 5000 IUs of vitamin D3 daily  Review the following resource for additional information regarding bone health  [email or web address]  Wound Care: Daily dressing changes starting when you return home.  Please see below  Discharge Wound Care Instructions  Do NOT apply any ointments, solutions or lotions to pin sites or surgical wounds.  These prevent needed drainage and even though solutions like hydrogen peroxide kill bacteria, they also damage cells lining the pin sites that help fight infection.  Applying lotions or ointments can keep the wounds moist and can cause them to breakdown and open up as well. This can increase the risk for infection. When in doubt call the office.  Surgical incisions should be dressed daily.  If any drainage is noted, use one layer of adaptic or Mepitel, then gauze, and tape.  Alternatively you can use a silicone foam dressing such as a Mepilex which is what you currently have on  [email or web address] [email or web address]  [email or web address]  These dressing supplies should be available at local medical supply stores ([employer], [employer] [facility], etc). They are not usually carried at places like [facility], [facility], [facility], etc  Once the incision is completely dry and without drainage, it may be left open to air out.  Showering may begin 36-48 hours  later.  Cleaning gently with soap and water.  Traumatic wounds should be dressed daily as well.    One layer of adaptic, gauze, Kerlix, then ace wrap.  The adaptic can be discontinued once the draining has ceased    If you have a wet to dry dressing: wet the gauze with saline the squeeze as much saline out so the gauze is moist (not soaking wet), place moistened gauze over wound, then place a dry gauze over the moist one, followed by Kerlix wrap, then ace wrap.  DVT/PE prophylaxis: Eliquis 2.5 mg every 12 hours for 30 days for blood clot prevention  Diet: as you were eating previously.  Can use over the counter stool softeners and bowel preparations, such as Miralax , to help with bowel movements.  Narcotics can be constipating.  Be sure to drink plenty of fluids  PAIN MEDICATION USE AND EXPECTATIONS  You have likely been given narcotic medications to help control your pain.  After a traumatic event that results in an fracture (broken bone) with or without surgery, it is ok to use narcotic pain medications to help control one's pain.  We understand that everyone responds to pain differently and each individual patient will be evaluated on a regular basis for the continued need for narcotic medications. Ideally, narcotic medication use should last no more than 6-8 weeks (coinciding with fracture healing).   As a patient it is your responsibility as well to monitor narcotic medication use and report the amount and frequency you use these medications when you come to your office visit.   We would also advise that if you are using narcotic medications, you should take a dose prior to therapy to maximize you participation.  IF YOU ARE ON NARCOTIC MEDICATIONS IT IS NOT PERMISSIBLE TO OPERATE A MOTOR VEHICLE (MOTORCYCLE/CAR/TRUCK/MOPED) OR HEAVY MACHINERY DO NOT MIX NARCOTICS WITH OTHER CNS (CENTRAL NERVOUS SYSTEM) DEPRESSANTS SUCH  AS ALCOHOL   POST-OPERATIVE OPIOID TAPER INSTRUCTIONS: It is important  to wean off of your opioid medication as soon as possible. If you do not need pain medication after your surgery it is ok to stop day one. Opioids include: Codeine, Hydrocodone (Norco, Vicodin), Oxycodone (Percocet, oxycontin ) and hydromorphone  amongst others.  Long term and even short term use of opiods can cause: Increased pain response Dependence Constipation Depression Respiratory depression And more.  Withdrawal symptoms can include Flu like symptoms Nausea, vomiting And more Techniques to manage these symptoms Hydrate well Eat regular healthy meals Stay active Use relaxation techniques(deep breathing, meditating, yoga) Do Not substitute Alcohol to help with tapering If you have been on opioids for less than two weeks and do not have pain than it is ok to stop all together.  Plan to wean off of opioids This plan should start within one week post op of your fracture surgery  Maintain the same interval or time between taking each dose and first decrease the dose.  Cut the total daily intake of opioids by one tablet each day Next start to increase the time between doses. The last dose that should be eliminated is the evening dose.    STOP SMOKING OR USING NICOTINE PRODUCTS!!!!  As discussed nicotine severely impairs your body's ability to heal surgical and traumatic wounds but also impairs bone healing.  Wounds and bone heal by forming microscopic blood vessels (angiogenesis) and nicotine is a vasoconstrictor (essentially, shrinks blood vessels).  Therefore, if vasoconstriction occurs to these microscopic blood vessels they essentially disappear and are unable to deliver necessary nutrients to the healing tissue.  This is one modifiable factor that you can do to dramatically increase your chances of healing your injury.    (This means no smoking, no nicotine gum, patches, etc)  DO NOT USE NONSTEROIDAL ANTI-INFLAMMATORY DRUGS (NSAID'S)  Using products such as Advil (ibuprofen), Aleve  (naproxen), Motrin (ibuprofen) for additional pain control during fracture healing can delay and/or prevent the healing response.  If you would like to take over the counter (OTC) medication, Tylenol  (acetaminophen ) is ok.  However, some narcotic medications that are given for pain control contain acetaminophen  as well. Therefore, you should not exceed more than 4000 mg of tylenol  in a day if you do not have liver disease.  Also note that there are may OTC medicines, such as cold medicines and allergy medicines that my contain tylenol  as well.  If you have any questions about medications and/or interactions please ask your doctor/PA or your pharmacist.      ICE AND ELEVATE INJURED/OPERATIVE EXTREMITY  Using ice and elevating the injured extremity above your heart can help with swelling and pain control.  Icing in a pulsatile fashion, such as 20 minutes on and 20 minutes off, can be followed.    Do not place ice directly on skin. Make sure there is a barrier between to skin and the ice pack.    Using frozen items such as frozen peas works well as the conform nicely to the are that needs to be iced.  USE AN ACE WRAP OR TED HOSE FOR SWELLING CONTROL  In addition to icing and elevation, Ace wraps or TED hose are used to help limit and resolve swelling.  It is recommended to use Ace wraps or TED hose until you are informed to stop.    When using Ace Wraps start the wrapping distally (farthest away from the body) and wrap proximally (closer to the body)   Example:  If you had surgery on your leg and you do not have a splint on, start the ace wrap at the toes and work your way up to the thigh        If you had surgery on your upper extremity and do not have a splint on, start the ace wrap at your fingers and work your way up to the upper arm  IF YOU ARE IN A SPLINT OR CAST DO NOT REMOVE IT FOR ANY REASON   If your splint gets wet for any reason please contact the office immediately. You may shower in your  splint or cast as long as you keep it dry.  This can be done by wrapping in a cast cover or garbage back (or similar)  Do Not stick any thing down your splint or cast such as pencils, money, or hangers to try and scratch yourself with.  If you feel itchy take benadryl as prescribed on the bottle for itching  IF YOU ARE IN A CAM BOOT (BLACK BOOT)  You may remove boot periodically. Perform daily dressing changes as noted below.  Wash the liner of the boot regularly and wear a sock when wearing the boot. It is recommended that you sleep in the boot until told otherwise    Call office for the following: Temperature greater than 101F Persistent nausea and vomiting Severe uncontrolled pain Redness, tenderness, or signs of infection (pain, swelling, redness, odor or green/yellow discharge around the site) Difficulty breathing, headache or visual disturbances Hives Persistent dizziness or light-headedness Extreme fatigue Any other questions or concerns you may have after discharge  In an emergency, call 911 or go to an Emergency Department at a nearby hospital  HELPFUL INFORMATION  If you had a block, it will wear off between 8-24 hrs postop typically.  This is period when your pain may go from nearly zero to the pain you would have had postop without the block.  This is an abrupt transition but nothing dangerous is happening.  You may take an extra dose of narcotic when this happens.  You should wean off your narcotic medicines as soon as you are able.  Most patients will be off or using minimal narcotics before their first postop appointment.   We suggest you use the pain medication the first night prior to going to bed, in order to ease any pain when the anesthesia wears off. You should avoid taking pain medications on an empty stomach as it will make you nauseous.  Do not drink alcoholic beverages or take illicit drugs when taking pain medications.  In most states it is against the law to  drive while you are in a splint or sling.  And certainly against the law to drive while taking narcotics.  You may return to work/school in the next couple of days when you feel up to it.   Pain medication may make you constipated.  Below are a few solutions to try in this order: Decrease the amount of pain medication if you aren't having pain. Drink lots of decaffeinated fluids. Drink prune juice and/or each dried prunes  If the first 3 don't work start with additional solutions Take Colace - an over-the-counter stool softener Take Senokot - an over-the-counter laxative Take Miralax  - a stronger over-the-counter laxative     CALL THE OFFICE WITH ANY QUESTIONS OR CONCERNS: [phone]   VISIT OUR WEBSITE FOR ADDITIONAL INFORMATION: [email or web address]

## 2024-09-21 NOTE — Progress Notes (Signed)
 Orthopaedic Trauma Service Progress Note  Patient ID: Ryan Lynch MRN: [id number] DOB/AGE: [birth year] 45 y.o.  Subjective:  No complaints with respect to his L hip  Ambulated in hall and did well   Foley place yesterday for retention   Minimal narcotic usage thus far    ROS As above  Today's  total administered Morphine  Milligram Equivalents: 16 Yesterday's total administered Morphine  Milligram Equivalents: 36  Objective:   VITALS:   Vitals:   09/20/24 1442 09/20/24 1955 09/21/24 0442 09/21/24 0718  BP: 130/88 (!) 129/90 128/87 (!) 137/90  Pulse: (!) 108 (!) 108 98 (!) 104  Resp: 17 18 18 18   Temp: 98.4 F (36.9 C) 100.2 F (37.9 C) 98.7 F (37.1 C) 98.8 F (37.1 C)  TempSrc: Rectal Rectal    SpO2: 94% 97% 92% 100%  Weight:      Height:        Estimated body mass index is 24.39 kg/m as calculated from the following:   Height as of this encounter: 5' 10 (1.778 m).   Weight as of this encounter: 77.1 kg.   Intake/Output      11/07 0701 11/08 0700 11/08 0701 11/09 0700   P.O. 480    I.V. (mL/kg)     IV Piggyback     Total Intake(mL/kg) 480 (6.2)    Urine (mL/kg/hr) 1895 (1) 350 (0.8)   Stool  0   Blood     Total Output 1895 350   Net -1415 -350        Stool Occurrence  1 x     LABS  Results for orders placed or performed during the hospital encounter of 09/18/24 (from the past 24 hours)  CBC     Status: Abnormal   Collection Time: 09/21/24  6:12 AM  Result Value Ref Range   WBC 4.1 4.0 - 10.5 K/uL   RBC 2.60 (L) 4.22 - 5.81 MIL/uL   Hemoglobin 8.3 (L) 13.0 - 17.0 g/dL   HCT 75.8 (L) 60.9 - 47.9 %   MCV 92.7 80.0 - 100.0 fL   MCH 31.9 26.0 - 34.0 pg   MCHC 34.4 30.0 - 36.0 g/dL   RDW 85.3 88.4 - 84.4 %   Platelets 102 (L) 150 - 400 K/uL   nRBC 0.0 0.0 - 0.2 %  Reticulocytes     Status: Abnormal   Collection Time: 09/21/24 11:53 AM  Result Value Ref Range   Retic Ct  Pct 4.7 (H) 0.4 - 3.1 %   RBC. 3.28 (L) 4.22 - 5.81 MIL/uL   Retic Count, Absolute 155.5 19.0 - 186.0 K/uL   Immature Retic Fract 18.8 (H) 2.3 - 15.9 %     PHYSICAL EXAM:   Hzw:obpwh comfortably in bed, NAD Lungs: unlabored  Cardiac: reg Ext:       Left Lower Extremity             Length and rotation symmetric to contralateral side  Dressings are clean  Changed this am              Extremity is warm             No DCT             Compartments are soft  No pain out of proportion with passive stretching of toes or ankle             DPN, SPN, TN sensory functions are intact             EHL, FHL, lesser toe motor functions intact             Ankle flexion, extension, inversion eversion intact             + DP pulse    Assessment/Plan: 2 Days Post-Op   Principal Problem:   Femur fracture ([name])   Anti-infectives (From admission, onward)    Start     Dose/Rate Route Frequency Ordered Stop   09/19/24 1830  ceFAZolin  (ANCEF ) IVPB 2g/100 mL premix        2 g 200 mL/hr over 30 Minutes Intravenous Every 6 hours 09/19/24 1515 09/20/24 0700   09/19/24 1100  ceFAZolin  (ANCEF ) IVPB 2g/100 mL premix        2 g 200 mL/hr over 30 Minutes Intravenous On call to O.R. 09/19/24 0859 09/19/24 1238   09/18/24 2200  amoxicillin-clavulanate (AUGMENTIN) 875-125 MG per tablet 1 tablet        1 tablet Oral Every 12 hours 09/18/24 1602 09/23/24 2159     .  POD/HD#: 83  43 year old male ground-level fall with left hip fracture   -Ground-level fall   -Left three-part intertrochanteric hip fracture s/p IMN             Weight-bear as tolerated left leg with assistance             No range of motion restrictions left hip or knee             Therapy evaluations             Dressing changes as needed                          Ice and elevate                 This is now patient's third fracture in 3 years.  Left tibial plateau fracture 2023 resulted after he fell down some stairs,  right proximal humerus fracture from last year occurred after he tripped over a building sign and this most recent left hip fracture was for him slipping on a wet floor.  This is very odd for someone of his age.  In addition to vitamin D  panel and also obtain TSH, PTH and testosterone panel.    - Pain management:               Multimodal    - ABL anemia/Hemodynamics               Monitor                Asymptomatic    - Medical issues                Per primary    - DVT/PE prophylaxis:               Sq heparin                DOAC x 30 days at DC    - ID:                Periop abx   - Metabolic Bone Disease:  Labs as noted above               Would argue that all of his fractures are fragility fracture                At this rate I think he needs a DEXA given history of 3 fractures from falls in 3 years               Testosterone pending                 Vitamin d  within normal limits would still supplement 5000 IUs D3 daily (ideally with vitamin k2 in it 100-200 mcg)   - Activity:               As above   - FEN/GI prophylaxis/Foley/Lines:               Reg diet   - Impediments to fracture healing:               Serial low energy fractures                Polysubstance use    - Dispo:               Needs voiding trial before he can dc    Francis MICAEL Mt, PA-C [phone] (C) 09/21/2024, 12:43 PM  [facility] [address] [place] [address] [phone] (F)    After 5pm and on the weekends please log on to Amion, go to orthopaedics and the look under the [facility] Call for the provider(s) on call. You can also call our office at [phone] and then follow the prompts to be connected to the call team.  Patient ID: Ryan Lynch, male   DOB: Mar 30, 1981, 43 y.o.   MRN: [id number]

## 2024-09-21 NOTE — Plan of Care (Signed)

## 2024-09-22 DIAGNOSIS — D62 Acute posthemorrhagic anemia: Secondary | ICD-10-CM

## 2024-09-22 DIAGNOSIS — S7291XA Unspecified fracture of right femur, initial encounter for closed fracture: Secondary | ICD-10-CM | POA: Diagnosis not present

## 2024-09-22 LAB — CBC WITH DIFFERENTIAL/PLATELET
Abs Immature Granulocytes: 0.02 K/uL (ref 0.00–0.07)
Basophils Absolute: 0 K/uL (ref 0.0–0.1)
Basophils Relative: 0 %
Eosinophils Absolute: 0.1 K/uL (ref 0.0–0.5)
Eosinophils Relative: 2 %
HCT: 25.6 % — ABNORMAL LOW (ref 39.0–52.0)
Hemoglobin: 8.8 g/dL — ABNORMAL LOW (ref 13.0–17.0)
Immature Granulocytes: 1 %
Lymphocytes Relative: 21 %
Lymphs Abs: 0.8 K/uL (ref 0.7–4.0)
MCH: 31.8 pg (ref 26.0–34.0)
MCHC: 34.4 g/dL (ref 30.0–36.0)
MCV: 92.4 fL (ref 80.0–100.0)
Monocytes Absolute: 0.3 K/uL (ref 0.1–1.0)
Monocytes Relative: 8 %
Neutro Abs: 2.4 K/uL (ref 1.7–7.7)
Neutrophils Relative %: 68 %
Platelets: 111 K/uL — ABNORMAL LOW (ref 150–400)
RBC: 2.77 MIL/uL — ABNORMAL LOW (ref 4.22–5.81)
RDW: 14.6 % (ref 11.5–15.5)
WBC: 3.5 K/uL — ABNORMAL LOW (ref 4.0–10.5)
nRBC: 0 % (ref 0.0–0.2)

## 2024-09-22 LAB — COMPREHENSIVE METABOLIC PANEL WITH GFR
ALT: 16 U/L (ref 0–44)
AST: 20 U/L (ref 15–41)
Albumin: 2.6 g/dL — ABNORMAL LOW (ref 3.5–5.0)
Alkaline Phosphatase: 62 U/L (ref 38–126)
Anion gap: 13 (ref 5–15)
BUN: 8 mg/dL (ref 6–20)
CO2: 25 mmol/L (ref 22–32)
Calcium: 8.3 mg/dL — ABNORMAL LOW (ref 8.9–10.3)
Chloride: 100 mmol/L (ref 98–111)
Creatinine, Ser: 0.87 mg/dL (ref 0.61–1.24)
GFR, Estimated: >60 mL/min (ref >60)
Glucose, Bld: 93 mg/dL (ref 70–99)
Potassium: 3.3 mmol/L — ABNORMAL LOW (ref 3.5–5.1)
Sodium: 138 mmol/L (ref 135–145)
Total Bilirubin: 1 mg/dL (ref 0.0–1.2)
Total Protein: 5.4 g/dL — ABNORMAL LOW (ref 6.5–8.1)

## 2024-09-22 LAB — MAGNESIUM: Magnesium: 2 mg/dL (ref 1.7–2.4)

## 2024-09-22 LAB — AMMONIA: Ammonia: 22 umol/L (ref 9–35)

## 2024-09-22 LAB — PHOSPHORUS: Phosphorus: 3.3 mg/dL (ref 2.5–4.6)

## 2024-09-22 MED ORDER — VITAMIN B-1 100 MG PO TABS: 100.0000 mg | ORAL_TABLET | Freq: Every day | ORAL | 0 refills | Status: AC

## 2024-09-22 MED ORDER — SENNOSIDES-DOCUSATE SODIUM 8.6-50 MG PO TABS: 1.0000 | ORAL_TABLET | Freq: Every evening | ORAL | 0 refills | Status: AC | PRN

## 2024-09-22 MED ORDER — POTASSIUM CHLORIDE CRYS ER 20 MEQ PO TBCR
40.0000 meq | EXTENDED_RELEASE_TABLET | Freq: Two times a day (BID) | ORAL | Status: DC
  Administered 2024-09-22: 40 meq via ORAL
  Filled 2024-09-22: qty 2

## 2024-09-22 MED ORDER — ADULT MULTIVITAMIN W/MINERALS CH: 1.0000 | ORAL_TABLET | Freq: Every day | ORAL | 0 refills | Status: AC

## 2024-09-22 MED ORDER — ACETAMINOPHEN 325 MG PO TABS: 650.0000 mg | ORAL_TABLET | Freq: Four times a day (QID) | ORAL | 0 refills | Status: AC | PRN

## 2024-09-22 MED ORDER — DOCUSATE SODIUM 100 MG PO CAPS: 100.0000 mg | ORAL_CAPSULE | Freq: Two times a day (BID) | ORAL | 0 refills | Status: AC

## 2024-09-22 MED ORDER — FOLIC ACID 1 MG PO TABS: 1.0000 mg | ORAL_TABLET | Freq: Every day | ORAL | 0 refills | Status: AC

## 2024-09-22 NOTE — Plan of Care (Signed)

## 2024-09-22 NOTE — Progress Notes (Signed)
 Discharge Instructions reviewed with patient and patient's father.  Medication from [facility] pharmacy provided.  No questions or concerns voiced.  Pt escorted out to private vehicle via staff.

## 2024-09-22 NOTE — TOC Transition Note (Signed)
 Transition of Care [facility]) - Discharge Note   Patient Details  Name: Ryan Lynch MRN: [id number] Date of Birth: [birth year]  Transition of Care [facility]) CM/SW Contact:  Robynn Eileen Hoose, RN Phone Number: 09/22/2024, 12:21 PM   Clinical Narrative:   Patient is being discharged today. Patient insurance out of network with area home health agencies. Per Ortho provider, ok for patient to go to outpatient physical therapy.  Referral # [id number] placed for [facility], closest to patient address on file. Contact information placed on AVS.    Final next level of care: Home/Self Care Barriers to Discharge: No Barriers Identified   Patient Goals and CMS Choice            Discharge Placement                       Discharge Plan and Services Additional resources added to the After Visit Summary for                                       Social Drivers of Health (SDOH) Interventions SDOH Screenings   Food Insecurity: No Food Insecurity (09/18/2024)  Housing: Low Risk  (09/18/2024)  Transportation Needs: No Transportation Needs (09/18/2024)  Utilities: Not At Risk (09/18/2024)  Tobacco Use: Medium Risk (09/19/2024)     Readmission Risk Interventions     No data to display

## 2024-09-22 NOTE — Discharge Summary (Signed)
 Physician Discharge Summary   Patient: Ryan Lynch MRN: [id number] DOB: 07/08/1981  Admit date:     09/18/2024  Discharge date: 09/22/2024  Discharge Physician: Alejandro Marker, DO   PCP: Patient, No Pcp Per   Recommendations at discharge:  {Tip this will not be part of the note when signed- Example include specific recommendations for outpatient follow-up, pending tests to follow-up on. (Optional):26781}  ***  Discharge Diagnoses: Principal Problem:   Femur fracture ([name])  Resolved Problems:   * No resolved hospital problems. *  Hospital Course: 43 year old male with PMHx remote alcohol abuse, psoriasis, chronic thrombocytopenia, and ADHD who presented to [name] ED on 09/18/2024 after mechanical fall at home.  Patient reportedly slipped on wet floor and fell onto his left hip.  In the ED, he was afebrile, tachycardic to 107, RR 18, BP 145/105, SpO2 199% on RA.  CBC, BMP, and HFP were largely unremarkable.  Ethanol negative.  X-ray of left femur showed acute comminuted displaced and impacted intertrochanteric fracture of the proximal femur with a laterally displaced distal fragment and displaced lesser trochanter fracture fragment.  Orthopedic surgery evaluated the patient and recommended admission for intramedullary nailing.  Assessment and Plan:  Ground-level mechanical fall #Left intertrochanteric fracture s/p IMN (11/6) - Underwent IMN on 11/6 - WBAT w/ walker or crutches - PT/OT recommending home with home health PT  - Post op abx per ortho - Post op anticoagulation per Ortho   # Acute post-operative blood loss anemia - Hgb 8.3, down from 13.5 pre-op - Iron studies altered by acute inflammation (Fe 25, TIBC 256, Fe sat 10%, ferritin 213) - No obvious bleeding at surgical site - FOBT ordered to rule out occult GI bleed, though unlikely - Transfuse for Hgb < 7 - Continue to monitor Hgb and s/s bleeding   # Mild indirect hyperbilirubinemia  - T. bili 1.6, indirect 1.2,  normal AST/ALT/ALP, normal LDH - Likely secondary to postop hematoma resorption versus transient stress response - Monitoring for now   #Mild B12 deficiency - B12 175 - IM cyanocobalamin 1000 mcg daily while hospitalized - Transition to p.o. at discharge - Will need to recheck vitamin B12 levels in 4 to 6 weeks   # Sinus tachycardia - Patient noted to be tachycardic to the low 100s during the day - Denies episodes of tachycardia and diaphoresis on ambulation today - Improved with rest   #Urinary retention - Status postplacement of coude catheter on 11/7 - Discontinued catheter today, voiding trial after 6 hours   # Concern for osteoporosis  - 43 year old with history of 3 low trauma fractures in 3 years most likely consistent with fragility fractures concerning for secondary osteoporosis/metabolic bone disease - Reports a history of vitamin D  deficiency but not on supplementation recently - Calcium corrected to 9.0, alk phos 47, AST 22, ALT 20, Phos 3.2 - Vitamin D  low normal at 35 - PTH, free testosterone pending - Vitamin D3 2000 units twice daily   #Chronic thrombocytopenia - Relatively stable, slight decrease likely consumptive postoperatively - Follow-up with PCP for further workup   # Anxiety - Continue home Zoloft  and Neurontin  *** ABLA:  Recent Labs  Lab 09/18/24 1350 09/18/24 1648 09/19/24 0910 09/20/24 0320 09/21/24 0612 09/22/24 0833  HGB 13.3 13.5 11.6* 9.8* 8.3* 8.8*  HCT 37.8* 39.9 34.6* 28.8* 24.1* 25.6*  MCV 91.7 92.4 94.8 93.8 92.7 92.4   Hyperammonemia: Improved. Ammonia Lvl went from 42 -> 22. CTM and Trend and repeat CMP in the  AM   Hyperbilirubinemia: T Bili Trend fluctuating but improved. T Bili went from 0.9 -> 1.4 -> 0.9 -> 1.6 -> 1.0. CTM & Trend  Hypoalbuminemia: Patient's Albumin  Lvl went from 3.9 -> 3.3 -> 3.0 -> 3.1 -> 2.6. CTM & Trend & repeat CMP in the AM    Assessment and Plan: No notes have been filed under this hospital  service. Service: Hospitalist     {Tip this will not be part of the note when signed Body mass index is 24.39 kg/m. , ,  (Optional):26781}  {(NOTE) Pain control PDMP Statment (Optional):26782} Consultants: *** Procedures performed: ***  Disposition: {Plan; Disposition:26390} Diet recommendation:  Discharge Diet Orders (From admission, onward)     Start     Ordered   09/22/24 0000  Diet - low sodium heart healthy        09/22/24 1326           {Diet_Plan:26776} DISCHARGE MEDICATION: Allergies as of 09/22/2024       Reactions   [name]   Itching eyes         Medication List     STOP taking these medications    promethazine -dextromethorphan 6.25-15 MG/5ML syrup Commonly known as: PROMETHAZINE -DM       TAKE these medications    acetaminophen  325 MG tablet Commonly known as: TYLENOL  Take 2 tablets (650 mg total) by mouth every 6 (six) hours as needed for moderate pain (pain score 4-6) (headache).   amoxicillin-clavulanate 875-125 MG tablet Commonly known as: AUGMENTIN Take 1 tablet by mouth every 12 (twelve) hours for 7 days.   amphetamine-dextroamphetamine 5 MG tablet Commonly known as: ADDERALL Take 1 tablet by mouth 3 (three) times daily. What changed:  when to take this reasons to take this   apixaban 2.5 MG Tabs tablet Commonly known as: Eliquis Take 1 tablet (2.5 mg total) by mouth 2 (two) times daily.   chlorhexidine  4 % external liquid Commonly known as: HIBICLENS  Apply 15 mLs (1 Application total) topically as directed for 30 doses. Use as directed daily for 5 days every other week for 6 weeks.   Cosentyx  UnoReady 300 MG/2ML Soaj Generic drug: Secukinumab  Inject 300 mg into the skin every 28 (twenty-eight) days.   docusate sodium  100 MG capsule Commonly known as: COLACE Take 1 capsule (100 mg total) by mouth 2 (two) times daily.   folic acid  1 MG tablet Commonly known as: FOLVITE  Take 1  tablet (1 mg total) by mouth daily. Start taking on: September 23, 2024   gabapentin 600 MG tablet Commonly known as: NEURONTIN Take 600 mg by mouth 3 (three) times daily.   HYDROcodone -acetaminophen  5-325 MG tablet Commonly known as: NORCO/VICODIN Take 1-2 tablets by mouth every 8 (eight) hours as needed for moderate pain (pain score 4-6).   ibuprofen 200 MG tablet Commonly known as: ADVIL Take 600 mg by mouth daily as needed for mild pain (pain score 1-3) or moderate pain (pain score 4-6).   methocarbamol  750 MG tablet Commonly known as: ROBAXIN  Take 1 tablet (750 mg total) by mouth every 8 (eight) hours as needed for muscle spasms.   multivitamin with minerals Tabs tablet Take 1 tablet by mouth daily. Start taking on: September 23, 2024   mupirocin ointment 2 % Commonly known as: BACTROBAN Place 1 Application into the nose 2 (two) times daily for 60 doses. Use as directed 2 times daily for 5 days every other week for 6 weeks.   senna-docusate  8.6-50 MG tablet Commonly known as: Senokot-S Take 1 tablet by mouth at bedtime as needed for mild constipation.   sertraline  100 MG tablet Commonly known as: ZOLOFT  Take 1 tablet (100 mg total) by mouth daily. What changed: when to take this   thiamine  100 MG tablet Commonly known as: Vitamin B-1 Take 1 tablet (100 mg total) by mouth daily. Start taking on: September 23, 2024   Vitamin D3 125 MCG (5000 UT) Tabs Take by mouth daily. What changed:  medication strength how much to take               Discharge Care Instructions  (From admission, onward)           Start     Ordered   09/22/24 0000  Discharge wound care:       Comments: Reinforce Dressing until discontinued by Orthopedic Surgery   09/22/24 1326            Follow-up Information     Celena Sharper, MD. Schedule an appointment as soon as possible for a visit in 2 week(s).   Specialty: Orthopedic Surgery Contact information: [address] [place] [address] [phone]         [facility] Follow up.   Specialty: Rehabilitation Why: Physical and Occupational therapy. Office will call to arrange follow up after hosptial discharge. Contact information: [address] [place]  [address] [phone]               Discharge Exam: Fredricka Weights   09/18/24 1307 09/19/24 1005  Weight: 77.1 kg 77.1 kg   ***  Condition at discharge: {DC Condition:26389}  The results of significant diagnostics from this hospitalization (including imaging, microbiology, ancillary and laboratory) are listed below for reference.   Imaging Studies: DG FEMUR PORT MIN 2 VIEWS LEFT Result Date: 09/19/2024 CLINICAL DATA:  Postop. EXAM: LEFT FEMUR PORTABLE 2 VIEWS COMPARISON:  Preoperative imaging FINDINGS: Femoral intramedullary nail with trans trochanteric and distal locking screw fixation traverse proximal femur fracture. Improved fracture alignment from preoperative imaging. Recent postsurgical change includes air and edema in the soft tissues. Ghost tracks in the femoral shaft from prior external fixator. IMPRESSION: ORIF of proximal femur fracture, without immediate postoperative complication. Electronically Signed   By: Andrea Gasman M.D.   On: 09/19/2024 15:40   DG HIP UNILAT WITH PELVIS 2-3 VIEWS LEFT Result Date: 09/19/2024 CLINICAL DATA:  461500 Elective surgery 461500 EXAM: DG HIP (WITH OR WITHOUT PELVIS) 2-3V LEFT COMPARISON:  Preoperative imaging FINDINGS: Seven fluoroscopic spot views of the left hip submitted from the operating room. Femoral intramedullary nail with trans trochanteric and distal locking screw fixation traverse proximal femur fracture. Fluoroscopy time 1 minutes 3 seconds. Dose 14.08 mGy. IMPRESSION: Intraoperative fluoroscopy during left proximal femur fracture ORIF. Electronically Signed   By: Andrea Gasman M.D.   On: 09/19/2024 15:39   DG C-Arm  1-60 Min-No Report Result Date: 09/19/2024 Fluoroscopy was utilized by the requesting physician.  No radiographic interpretation.   DG C-Arm 1-60 Min-No Report Result Date: 09/19/2024 Fluoroscopy was utilized by the requesting physician.  No radiographic interpretation.   DG Pelvis 1-2 Views Result Date: 09/18/2024 EXAM: 1 or 2 VIEW(S) XRAY OF THE PELVIS 09/18/2024 02:23:00 PM COMPARISON: CT 07/10/2022. CLINICAL HISTORY: 809823 Fall 190176. FINDINGS: BONES AND JOINTS: Acute comminuted left femoral intertrochanteric fracture with varus angulation. Mildly displaced lesser trochanter fracture fragment. No joint dislocation. SOFT TISSUES: The soft tissues are unremarkable. IMPRESSION:  1. Acute comminuted left femoral intertrochanteric fracture with varus angulation and a mildly displaced lesser trochanter fracture fragment. Electronically signed by: Dayne Hassell MD 09/18/2024 02:50 PM EST RP Workstation: [id number]   DG Femur Min 2 Views Left Result Date: 09/18/2024 EXAM: 2 VIEW(S) XRAY OF THE LEFT FEMUR 09/18/2024 02:23:00 PM COMPARISON: CT 07/10/2022. CLINICAL HISTORY: Mechanical fall with pivot to L side, heard an audible pop, severe L sided Hip pain. FINDINGS: BONES AND JOINTS: Acute comminuted displaced and impacted intertrochanteric fracture of the proximal femur. Laterally displaced distal fragment. Displaced lesser trochanter fracture fragment. Remote hardware defects in the mid femoral shaft. Partially visualized plate and screw fixation of the lateral tibial plateau in place. No joint dislocation. SOFT TISSUES: The soft tissues are unremarkable. IMPRESSION: 1. Acute comminuted displaced and impacted intertrochanteric fracture of the proximal femur with laterally displaced distal fragment and displaced lesser trochanter fracture fragment. Electronically signed by: Dayne Hassell MD 09/18/2024 02:49 PM EST RP Workstation: [id number]   DG Chest 2 View Result Date: 09/17/2024 EXAM: 2 VIEW(S) XRAY OF  THE CHEST 09/17/2024 09:58:22 AM COMPARISON: Chest CTA 07/10/2022 and earlier. CLINICAL HISTORY: productive cough, 2 weeks FINDINGS: LUNGS AND PLEURA: No focal pulmonary opacity. No pulmonary edema. No pleural effusion. No pneumothorax. HEART AND MEDIASTINUM: No acute abnormality of the cardiac and mediastinal silhouettes. BONES AND SOFT TISSUES: Old healed proximal right humeral fracture. IMPRESSION: 1. No acute cardiopulmonary process. Electronically signed by: Helayne Hurst MD 09/17/2024 10:47 AM EST RP Workstation: [id number]    Microbiology: Results for orders placed or performed during the hospital encounter of 09/18/24  Surgical pcr screen     Status: Abnormal   Collection Time: 09/19/24  4:03 AM   Specimen: Nasal Mucosa; Nasal Swab  Result Value Ref Range Status   MRSA, PCR NEGATIVE NEGATIVE Final   Staphylococcus aureus POSITIVE (A) NEGATIVE Final    Comment: (NOTE) The Xpert SA Assay (FDA approved for NASAL specimens in patients 57 years of age and older), is one component of a comprehensive surveillance program. It is not intended to diagnose infection nor to guide or monitor treatment. Performed at [facility], [address], [place], [place] [address]     Labs: CBC: Recent Labs  Lab 09/18/24 1350 09/18/24 1648 09/19/24 0910 09/20/24 0320 09/21/24 0612 09/22/24 0833  WBC 4.9 7.6 4.8 5.4 4.1 3.5*  NEUTROABS 3.9  --   --   --   --  2.4  HGB 13.3 13.5 11.6* 9.8* 8.3* 8.8*  HCT 37.8* 39.9 34.6* 28.8* 24.1* 25.6*  MCV 91.7 92.4 94.8 93.8 92.7 92.4  PLT 129* 148* 111* 112* 102* 111*   Basic Metabolic Panel: Recent Labs  Lab 09/18/24 1350 09/18/24 1648 09/19/24 0910 09/20/24 0320 09/22/24 0833  NA 140  --  142 138 138  K 4.2  --  4.0 3.8 3.3*  CL 104  --  109 100 100  CO2 23  --  21* 26 25  GLUCOSE 135*  --  102* 106* 93  BUN 5*  --  8 11 8   CREATININE 0.93 1.01 0.98 0.95 0.87  CALCIUM 8.9  --  8.4* 8.3* 8.3*  MG  --   --   --   --  2.0  PHOS  --   --    --  3.2 3.3   Liver Function Tests: Recent Labs  Lab 09/18/24 1648 09/19/24 0910 09/20/24 0320 09/21/24 1153 09/22/24 0833  AST 28 22 21  32 20  ALT 21 20 16  18 16  ALKPHOS 53 47 44 65 62  BILITOT 0.9 1.4* 0.9 1.6* 1.0  PROT 6.4* 5.6* 5.4* 6.2* 5.4*  ALBUMIN  3.9 3.3* 3.0* 3.1* 2.6*   CBG: No results for input(s): GLUCAP in the last 168 hours.  Discharge time spent: {LESS THAN/GREATER UYJW:73611} 30 minutes.  Signed: Alejandro Lazarus Marker, DO [facility] 09/22/2024

## 2024-09-22 NOTE — Plan of Care (Signed)
 Problem: Education: Goal: Knowledge of General Education information will improve Description: Including pain rating scale, medication(s)/side effects and non-pharmacologic comfort measures 09/22/2024 1437 by German Adrien BRAVO, RN Outcome: Completed/Met 09/22/2024 1437 by German Adrien BRAVO, RN Outcome: Progressing   Problem: Health Behavior/Discharge Planning: Goal: Ability to manage health-related needs will improve 09/22/2024 1437 by German Adrien BRAVO, RN Outcome: Completed/Met 09/22/2024 1437 by German Adrien BRAVO, RN Outcome: Progressing   Problem: Clinical Measurements: Goal: Ability to maintain clinical measurements within normal limits will improve 09/22/2024 1437 by German Adrien BRAVO, RN Outcome: Completed/Met 09/22/2024 1437 by German Adrien BRAVO, RN Outcome: Progressing Goal: Will remain free from infection 09/22/2024 1437 by German Adrien BRAVO, RN Outcome: Completed/Met 09/22/2024 1437 by German Adrien BRAVO, RN Outcome: Progressing Goal: Diagnostic test results will improve 09/22/2024 1437 by German Adrien BRAVO, RN Outcome: Completed/Met 09/22/2024 1437 by German Adrien BRAVO, RN Outcome: Progressing Goal: Respiratory complications will improve 09/22/2024 1437 by German Adrien BRAVO, RN Outcome: Completed/Met 09/22/2024 1437 by German Adrien BRAVO, RN Outcome: Progressing Goal: Cardiovascular complication will be avoided 09/22/2024 1437 by German Adrien BRAVO, RN Outcome: Completed/Met 09/22/2024 1437 by German Adrien BRAVO, RN Outcome: Progressing   Problem: Activity: Goal: Risk for activity intolerance will decrease 09/22/2024 1437 by German Adrien BRAVO, RN Outcome: Completed/Met 09/22/2024 1437 by German Adrien BRAVO, RN Outcome: Progressing   Problem: Nutrition: Goal: Adequate nutrition will be maintained 09/22/2024 1437 by German Adrien BRAVO, RN Outcome: Completed/Met 09/22/2024 1437 by German Adrien BRAVO, RN Outcome: Progressing   Problem: Coping: Goal: Level of anxiety will decrease 09/22/2024 1437 by German Adrien BRAVO, RN Outcome: Completed/Met 09/22/2024 1437 by German Adrien BRAVO, RN Outcome: Progressing   Problem: Elimination: Goal: Will not experience complications related to bowel motility 09/22/2024 1437 by German Adrien BRAVO, RN Outcome: Completed/Met 09/22/2024 1437 by German Adrien BRAVO, RN Outcome: Progressing Goal: Will not experience complications related to urinary retention 09/22/2024 1437 by German Adrien BRAVO, RN Outcome: Completed/Met 09/22/2024 1437 by German Adrien BRAVO, RN Outcome: Progressing   Problem: Pain Managment: Goal: General experience of comfort will improve and/or be controlled 09/22/2024 1437 by German Adrien BRAVO, RN Outcome: Completed/Met 09/22/2024 1437 by German Adrien BRAVO, RN Outcome: Progressing   Problem: Safety: Goal: Ability to remain free from injury will improve 09/22/2024 1437 by German Adrien BRAVO, RN Outcome: Completed/Met 09/22/2024 1437 by German Adrien BRAVO, RN Outcome: Progressing   Problem: Skin Integrity: Goal: Risk for impaired skin integrity will decrease 09/22/2024 1437 by German Adrien BRAVO, RN Outcome: Completed/Met 09/22/2024 1437 by German Adrien BRAVO, RN Outcome: Progressing   Problem: Education: Goal: Verbalization of understanding the information provided (i.e., activity precautions, restrictions, etc) will improve 09/22/2024 1437 by German Adrien BRAVO, RN Outcome: Completed/Met 09/22/2024 1437 by German Adrien BRAVO, RN Outcome: Progressing Goal: Individualized Educational Video(s) 09/22/2024 1437 by German Adrien BRAVO, RN Outcome: Completed/Met 09/22/2024 1437 by German Adrien BRAVO, RN Outcome: Progressing   Problem: Activity: Goal: Ability to ambulate and perform ADLs will improve 09/22/2024 1437 by German Adrien BRAVO, RN Outcome: Completed/Met 09/22/2024 1437 by German Adrien BRAVO, RN Outcome: Progressing   Problem: Clinical Measurements: Goal: Postoperative complications will be avoided or minimized 09/22/2024 1437 by German Adrien BRAVO, RN Outcome:  Completed/Met 09/22/2024 1437 by German Adrien BRAVO, RN Outcome: Progressing   Problem: Self-Concept: Goal: Ability to maintain and perform role responsibilities to the fullest extent possible will improve 09/22/2024 1437 by German Adrien BRAVO, RN Outcome: Completed/Met 09/22/2024 1437 by German Adrien BRAVO, RN Outcome: Progressing   Problem:  Pain Management: Goal: Pain level will decrease 09/22/2024 1437 by German Adrien BRAVO, RN Outcome: Completed/Met 09/22/2024 1437 by German Adrien BRAVO, RN Outcome: Progressing

## 2024-09-23 NOTE — Telephone Encounter (Signed)
Agree, thanks for the update 

## 2024-10-01 ENCOUNTER — Encounter: Payer: Self-pay | Admitting: Rehabilitative and Restorative Service Providers"

## 2024-10-01 ENCOUNTER — Ambulatory Visit: Attending: Orthopedic Surgery | Admitting: Rehabilitative and Restorative Service Providers"

## 2024-10-01 ENCOUNTER — Other Ambulatory Visit: Payer: Self-pay

## 2024-10-01 DIAGNOSIS — M79605 Pain in left leg: Secondary | ICD-10-CM | POA: Insufficient documentation

## 2024-10-01 DIAGNOSIS — M6281 Muscle weakness (generalized): Secondary | ICD-10-CM | POA: Diagnosis not present

## 2024-10-01 DIAGNOSIS — R262 Difficulty in walking, not elsewhere classified: Secondary | ICD-10-CM | POA: Diagnosis not present

## 2024-10-01 NOTE — Therapy (Signed)
 OUTPATIENT PHYSICAL THERAPY LOWER EXTREMITY EVALUATION   Patient Name: Ryan Lynch MRN: [id number] DOB:1981-07-20, 43 y.o., male Today's Date: 10/01/2024  END OF SESSION:  PT End of Session - 10/01/24 1404     Visit Number 1    Date for Recertification  11/22/24    Authorization Type BC/BS    PT Start Time 1400    PT Stop Time 1440    PT Time Calculation (min) 40 min    Activity Tolerance Patient tolerated treatment well    Behavior During Therapy [facility] for tasks assessed/performed          Past Medical History:  Diagnosis Date   Acute anemia 06/2022   Acute metabolic encephalopathy 06/2022   Acute urinary retention 06/2022   ADD (attention deficit disorder)    Adrenal nodule 06/2022   AKI (acute kidney injury) 06/2022   Broken arm 09/2023   Broken humerus    Closed nondisplaced fracture of left tibial plateau 07/2022   Multiple rib fractures 06/2022   Psoriasis    Psoriatic arthritis ([name])    Thrombocytopenia 06/2022   Traumatic rhabdomyolysis 06/2022   Past Surgical History:  Procedure Laterality Date   ANTERIOR CRUCIATE LIGAMENT REPAIR Right    ANTERIOR CRUCIATE LIGAMENT REPAIR Right    APPLICATION OF WOUND VAC Left 07/02/2022   Procedure: APPLICATION OF WOUND VAC;  Surgeon: Celena Sharper, MD;  Location: [facility] OR;  Service: Orthopedics;  Laterality: Left;   APPLICATION OF WOUND VAC Left 07/05/2022   Procedure: APPLICATION OF WOUND VAC;  Surgeon: Celena Sharper, MD;  Location: [facility] OR;  Service: Orthopedics;  Laterality: Left;   CLOSED REDUCTION TIBIA Left 07/02/2022   Procedure: CLOSED REDUCTION TIBIA;  Surgeon: Celena Sharper, MD;  Location: [facility] OR;  Service: Orthopedics;  Laterality: Left;   EXTERNAL FIXATION LEG Left 07/02/2022   Procedure: EXTERNAL FIXATION LEG;  Surgeon: Celena Sharper, MD;  Location: [facility] OR;  Service: Orthopedics;  Laterality: Left;   FASCIOTOMY Left 07/02/2022   Procedure: FASCIOTOMY;  Surgeon: Celena Sharper, MD;  Location: [facility] OR;  Service:  Orthopedics;  Laterality: Left;   I & D EXTREMITY Left 07/07/2022   Procedure: IRRIGATION AND DEBRIDEMENT EXTREMITY;  Surgeon: Celena Sharper, MD;  Location: [facility] OR;  Service: Orthopedics;  Laterality: Left;   INTRAMEDULLARY (IM) NAIL INTERTROCHANTERIC Left 09/19/2024   Procedure: FIXATION, FRACTURE, INTERTROCHANTERIC, WITH INTRAMEDULLARY ROD;  Surgeon: Celena Sharper, MD;  Location: [facility] OR;  Service: Orthopedics;  Laterality: Left;   ORIF TIBIA PLATEAU Left 07/05/2022   Procedure: OPEN REDUCTION INTERNAL FIXATION (ORIF) TIBIAL PLATEAU;  Surgeon: Celena Sharper, MD;  Location: [facility] OR;  Service: Orthopedics;  Laterality: Left;   SECONDARY CLOSURE OF WOUND Left 07/05/2022   Procedure: SECONDARY CLOSURE OF WOUND;  Surgeon: Celena Sharper, MD;  Location: [facility] OR;  Service: Orthopedics;  Laterality: Left;   SECONDARY CLOSURE OF WOUND Left 07/07/2022   Procedure: SECONDARY CLOSURE OF WOUND;  Surgeon: Celena Sharper, MD;  Location: [facility] OR;  Service: Orthopedics;  Laterality: Left;   SHOULDER SURGERY Bilateral    WISDOM TOOTH EXTRACTION  05/23/2023   Patient Active Problem List   Diagnosis Date Noted   Femur fracture ([name]) 09/18/2024   Psoriatic arthritis ([name]) 03/20/2023   High risk medication use 03/20/2023   Vitamin D  deficiency 03/20/2023   Generalized anxiety disorder    Closed nondisplaced fracture of left tibial plateau 07/16/2022   Multiple rib fractures 07/11/2022   Compression fracture of body of thoracic vertebra ([name]) 07/11/2022   Sepsis ([name]) 07/09/2022  Acute urinary retention 07/09/2022   Prolonged QT interval 07/09/2022   AKI (acute kidney injury) 07/07/2022   Alcohol withdrawal ([name]) 07/05/2022   Acute anemia 07/05/2022   Psoriasis 07/05/2022   Ileus ([name]) 07/04/2022   Acute metabolic encephalopathy 07/04/2022   Traumatic rhabdomyolysis 07/03/2022   Observation after surgery 07/02/2022   Tibial plateau fracture, left 07/02/2022   Alcohol use 07/02/2022   Transaminitis  07/02/2022   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   Adrenal nodule 07/02/2022   Compression fracture of T7 vertebra ([name]) 07/02/2022   Thrombocytopenia 07/02/2022   Anxiety 07/02/2022    PCP: Does not currently have a PCP, but seeing other Rheumatologist regularly  REFERRING PROVIDER: Deward Eck, PA-C  REFERRING DIAG: D27.990J (ICD-10-CM) - Hip fracture ([name])  THERAPY DIAG:  Pain in left leg - Plan: PT plan of care cert/re-cert  Difficulty in walking, not elsewhere classified - Plan: PT plan of care cert/re-cert  Muscle weakness (generalized) - Plan: PT plan of care cert/re-cert  Rationale for Evaluation and Treatment: Rehabilitation  ONSET DATE: 09/18/2024  SUBJECTIVE:   SUBJECTIVE STATEMENT: Patient slipped and fell onto his left hip on 09/19/2024, sustaining a hip fracture.  Patient underwent left hip ORIF on 09/19/2024.  PERTINENT HISTORY: Hx of T7 compression Fracture, Hx of left tibial plateau Fx with multiple surgeries in 2023, Psoriatic arthritis, Thrombocytopenia PAIN:  Are you having pain? Yes: NPRS scale: 1-7/10 Pain location: left hip  Pain description: aching on the verge of sharp Aggravating factors: weight bearing Relieving factors: rest  PRECAUTIONS: None  RED FLAGS: None   WEIGHT BEARING RESTRICTIONS: WBAT with walker or crutches  FALLS:  Has patient fallen in last 6 months? Yes. Number of falls 1  LIVING ENVIRONMENT: Lives with: lives with their family Lives in: House/apartment Stairs: two level, but staying on the bottom floor Has following equipment at home: Vannie - 2 wheeled, Crutches, Wheelchair (manual), shower chair, and Grab bars  OCCUPATION: Works for the [employer], Also works as a architectural technologist  PLOF: Independent and Leisure: hiking, working out  PATIENT GOALS: To return back to normal activities and active lifestyle.  NEXT MD VISIT: November 19, 2024 with Rheumatology with Dr Jeannetta  OBJECTIVE:  Note: Objective  measures were completed at Evaluation unless otherwise noted.  DIAGNOSTIC FINDINGS:  Left Femur Radiograph on 09/19/2024: IMPRESSION: ORIF of proximal femur fracture, without immediate postoperative complication.  PATIENT SURVEYS:  LEFS  Extreme difficulty/unable (0), Quite a bit of difficulty (1), Moderate difficulty (2), Little difficulty (3), No difficulty (4) Survey date:  10/01/24  Any of your usual work, housework or school activities 2  2. Usual hobbies, recreational or sporting activities 0  3. Getting into/out of the bath 3  4. Walking between rooms 2  5. Putting on socks/shoes 4  6. Squatting  0  7. Lifting an object, like a bag of groceries from the floor 1  8. Performing light activities around your home 3  9. Performing heavy activities around your home 0  10. Getting into/out of a car 3  11. Walking 2 blocks 0  12. Walking 1 mile 0  13. Going up/down 10 stairs (1 flight) 0  14. Standing for 1 hour 0  15.  sitting for 1 hour 4  16. Running on even ground 0  17. Running on uneven ground 0  18. Making sharp turns while running fast 0  19. Hopping  0  20. Rolling over in bed 2  Score total:  24/80 = 30%  COGNITION: Overall cognitive status: Within functional limits for tasks assessed     SENSATION: Patient denies numbness and tingling  MUSCLE LENGTH: Hamstrings: minimal tightness noted bilaterally  POSTURE: rounded shoulders  PALPATION: Tender to palpation along sight of surgery  LOWER EXTREMITY ROM:  Eval: WFL, but with pain at left hip  LOWER EXTREMITY MMT:  Eval: Right LE strength is WFL Left hip strength is 3/5 Left quad strength is 4-/5 Left hamstring strength is 4-/5   FUNCTIONAL TESTS:  Eval: 5 times sit to stand: 10.53 sec with unilateral hand on the RW Timed up and go (TUG): 13.24 sec with RW  GAIT: Distance walked: >100 ft Assistive device utilized: Environmental Consultant - 2 wheeled Level of assistance: SBA Comments: Decreased weight  bearing through LLE with antalgic gait noted                                                                                                                                TREATMENT DATE:  10/01/2024: Reviewed HEP and discussed role of PT  Nustep level 1 x6 min with PT present to discuss status Seated LAQ 2x10 left LE Seated marching 2x5 bilat Seated hip adduction ball squeeze 2x10   PATIENT EDUCATION:  Education details: Issued HEP Person educated: Patient Education method: Explanation, Facilities Manager, and Handouts Education comprehension: verbalized understanding  HOME EXERCISE PROGRAM: Access Code: [email or web address] URL: [email or web address] Date: 10/01/2024 Prepared by: Jarrell Marlicia Sroka  Exercises - Sit to Stand with Armchair  - 1 x daily - 7 x weekly - 2 sets - 10 reps - Long Sitting Quad Set  - 1 x daily - 7 x weekly - 2 sets - 10 reps - Supine Active Straight Leg Raise  - 1 x daily - 7 x weekly - 2 sets - 10 reps - Supine Bridge  - 1 x daily - 7 x weekly - 2 sets - 10 reps - Clamshell  - 1 x daily - 7 x weekly - 2 sets - 10 reps - Sidelying Hip Abduction  - 1 x daily - 7 x weekly - 2 sets - 10 reps - Standing Heel Raise  - 1 x daily - 7 x weekly - 2 sets - 20 reps - Standing March with Counter Support  - 1 x daily - 7 x weekly - 1-2 sets - 10 reps  ASSESSMENT:  CLINICAL IMPRESSION: Patient is a 43 y.o. male who was seen today for physical therapy evaluation and treatment for s/p left hip ORIF.  Patient is known to this PT from previous admission 2 years ago when he sustained a tibial plateau fracture.  Patient slipped and landed badly on his left hip and he sustained a left femur fracture and underwent ORIF.  Patient presents with difficulty walking, muscle weakness, decreased balance, increased left hip pain, and difficulty performing functional tasks.  Patient would benefit from skilled PT to progress towards goal related activities.   OBJECTIVE IMPAIRMENTS:  Abnormal  gait, decreased activity tolerance, decreased balance, difficulty walking, decreased strength, increased muscle spasms, impaired flexibility, and pain.   ACTIVITY LIMITATIONS: carrying, lifting, bending, standing, squatting, sleeping, stairs, transfers, and locomotion level  PARTICIPATION LIMITATIONS: meal prep, cleaning, driving, community activity, and occupation  PERSONAL FACTORS: Fitness, Past/current experiences, and 3+ comorbidities: Hx of left tibial plateau Fx,  Psoriatic arthritis, Thrombocytopenia are also affecting patient's functional outcome.   REHAB POTENTIAL: Good  CLINICAL DECISION MAKING: Evolving/moderate complexity  EVALUATION COMPLEXITY: Moderate   GOALS: Goals reviewed with patient? Yes  SHORT TERM GOALS: Target date: 10/25/2024 Pt will be independent with initial HEP.  Baseline: Goal status: INITIAL  2.  Patient will participate in a 6 minute walk test to establish a baseline measurement. Baseline:  Goal status: INITIAL  3.  Patient will report at least a 25% improvement in symptoms since starting PT. Baseline:  Goal status: INITIAL   LONG TERM GOALS: Target date: 11/22/2024  Pt will be independent with advanced HEP to allow for self progression after discharge. Baseline:  Goal status: INITIAL  2.  Pt will report no increase in pain with ambulation with LRAD when walking around the grocery store.  Baseline:  Goal status: INITIAL  3.  Patient will improve Lower Extremity Functional Status to at least 55% to demonstrate improved functional mobility. Baseline: 30% Goal status: INITIAL  4.  Patient will report ability to return to hiking without increased pain. Baseline:  Goal status: INITIAL  5.  Patient to increase LE strength to [facility] to allow him to negotiate stairs with reciprocal pattern. Baseline:  Goal status: INITIAL     PLAN:  PT FREQUENCY: 1-2x/week  PT DURATION: 8 weeks  PLANNED INTERVENTIONS: [id number]- PT Re-evaluation,  97750- Physical Performance Testing, 97110-Therapeutic exercises, 97530- Therapeutic activity, 97112- Neuromuscular re-education, 97535- Self Care, [id number]- Manual therapy, [phone]- Gait training, [phone]- Canalith repositioning, [id number]- Aquatic Therapy, [phone]- Electrical stimulation (unattended), [phone]- Electrical stimulation (manual), [id number]- Vasopneumatic device, [id number]- Ultrasound, [id number]- Ionotophoresis 4mg /ml Dexamethasone , [id number] (1-2 muscles), [id number] (3+ muscles)- Dry Needling, Patient/Family education, Balance training, Stair training, Taping, Joint mobilization, Joint manipulation, Scar mobilization, Vestibular training, Cryotherapy, and Moist heat  PLAN FOR NEXT SESSION: Assess and progress HEP as indicated, strengthening, flexibility, gait training    Jarrell Laming, PT, DPT 10/01/24, 3:39 PM  [facility] [address], [address] [place], [place] [address] Phone # [phone] Fax [phone]

## 2024-10-03 ENCOUNTER — Ambulatory Visit

## 2024-10-07 DIAGNOSIS — S72142D Displaced intertrochanteric fracture of left femur, subsequent encounter for closed fracture with routine healing: Secondary | ICD-10-CM | POA: Diagnosis not present

## 2024-10-08 DIAGNOSIS — F4010 Social phobia, unspecified: Secondary | ICD-10-CM | POA: Diagnosis not present

## 2024-10-08 DIAGNOSIS — F410 Panic disorder [episodic paroxysmal anxiety] without agoraphobia: Secondary | ICD-10-CM | POA: Diagnosis not present

## 2024-10-08 DIAGNOSIS — F900 Attention-deficit hyperactivity disorder, predominantly inattentive type: Secondary | ICD-10-CM | POA: Diagnosis not present

## 2024-10-15 ENCOUNTER — Ambulatory Visit

## 2024-10-15 DIAGNOSIS — M79605 Pain in left leg: Secondary | ICD-10-CM | POA: Diagnosis present

## 2024-10-15 DIAGNOSIS — M6281 Muscle weakness (generalized): Secondary | ICD-10-CM | POA: Diagnosis present

## 2024-10-15 DIAGNOSIS — M5459 Other low back pain: Secondary | ICD-10-CM | POA: Diagnosis present

## 2024-10-15 DIAGNOSIS — R262 Difficulty in walking, not elsewhere classified: Secondary | ICD-10-CM | POA: Insufficient documentation

## 2024-10-15 NOTE — Therapy (Signed)
 OUTPATIENT PHYSICAL THERAPY LOWER EXTREMITY EVALUATION   Patient Name: Ryan Lynch MRN: [id number] DOB:Apr 04, 1981, 43 y.o., male Today's Date: 10/15/2024  END OF SESSION:  PT End of Session - 10/15/24 1231     Visit Number 2    Date for Recertification  11/22/24    Authorization Type BC/BS    PT Start Time 1148    PT Stop Time 1232    PT Time Calculation (min) 44 min    Activity Tolerance Patient tolerated treatment well    Behavior During Therapy [facility] for tasks assessed/performed           Past Medical History:  Diagnosis Date   Acute anemia 06/2022   Acute metabolic encephalopathy 06/2022   Acute urinary retention 06/2022   ADD (attention deficit disorder)    Adrenal nodule 06/2022   AKI (acute kidney injury) 06/2022   Broken arm 09/2023   Broken humerus    Closed nondisplaced fracture of left tibial plateau 07/2022   Multiple rib fractures 06/2022   Psoriasis    Psoriatic arthritis ([name])    Thrombocytopenia 06/2022   Traumatic rhabdomyolysis 06/2022   Past Surgical History:  Procedure Laterality Date   ANTERIOR CRUCIATE LIGAMENT REPAIR Right    ANTERIOR CRUCIATE LIGAMENT REPAIR Right    APPLICATION OF WOUND VAC Left 07/02/2022   Procedure: APPLICATION OF WOUND VAC;  Surgeon: Celena Sharper, MD;  Location: [facility] OR;  Service: Orthopedics;  Laterality: Left;   APPLICATION OF WOUND VAC Left 07/05/2022   Procedure: APPLICATION OF WOUND VAC;  Surgeon: Celena Sharper, MD;  Location: [facility] OR;  Service: Orthopedics;  Laterality: Left;   CLOSED REDUCTION TIBIA Left 07/02/2022   Procedure: CLOSED REDUCTION TIBIA;  Surgeon: Celena Sharper, MD;  Location: [facility] OR;  Service: Orthopedics;  Laterality: Left;   EXTERNAL FIXATION LEG Left 07/02/2022   Procedure: EXTERNAL FIXATION LEG;  Surgeon: Celena Sharper, MD;  Location: [facility] OR;  Service: Orthopedics;  Laterality: Left;   FASCIOTOMY Left 07/02/2022   Procedure: FASCIOTOMY;  Surgeon: Celena Sharper, MD;  Location: [facility] OR;  Service:  Orthopedics;  Laterality: Left;   I & D EXTREMITY Left 07/07/2022   Procedure: IRRIGATION AND DEBRIDEMENT EXTREMITY;  Surgeon: Celena Sharper, MD;  Location: [facility] OR;  Service: Orthopedics;  Laterality: Left;   INTRAMEDULLARY (IM) NAIL INTERTROCHANTERIC Left 09/19/2024   Procedure: FIXATION, FRACTURE, INTERTROCHANTERIC, WITH INTRAMEDULLARY ROD;  Surgeon: Celena Sharper, MD;  Location: [facility] OR;  Service: Orthopedics;  Laterality: Left;   ORIF TIBIA PLATEAU Left 07/05/2022   Procedure: OPEN REDUCTION INTERNAL FIXATION (ORIF) TIBIAL PLATEAU;  Surgeon: Celena Sharper, MD;  Location: [facility] OR;  Service: Orthopedics;  Laterality: Left;   SECONDARY CLOSURE OF WOUND Left 07/05/2022   Procedure: SECONDARY CLOSURE OF WOUND;  Surgeon: Celena Sharper, MD;  Location: [facility] OR;  Service: Orthopedics;  Laterality: Left;   SECONDARY CLOSURE OF WOUND Left 07/07/2022   Procedure: SECONDARY CLOSURE OF WOUND;  Surgeon: Celena Sharper, MD;  Location: [facility] OR;  Service: Orthopedics;  Laterality: Left;   SHOULDER SURGERY Bilateral    WISDOM TOOTH EXTRACTION  05/23/2023   Patient Active Problem List   Diagnosis Date Noted   Femur fracture ([name]) 09/18/2024   Psoriatic arthritis ([name]) 03/20/2023   High risk medication use 03/20/2023   Vitamin D  deficiency 03/20/2023   Generalized anxiety disorder    Closed nondisplaced fracture of left tibial plateau 07/16/2022   Multiple rib fractures 07/11/2022   Compression fracture of body of thoracic vertebra ([name]) 07/11/2022   Sepsis ([name]) 07/09/2022  Acute urinary retention 07/09/2022   Prolonged QT interval 07/09/2022   AKI (acute kidney injury) 07/07/2022   Alcohol withdrawal ([name]) 07/05/2022   Acute anemia 07/05/2022   Psoriasis 07/05/2022   Ileus ([name]) 07/04/2022   Acute metabolic encephalopathy 07/04/2022   Traumatic rhabdomyolysis 07/03/2022   Observation after surgery 07/02/2022   Tibial plateau fracture, left 07/02/2022   Alcohol use 07/02/2022   Transaminitis  07/02/2022   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   Adrenal nodule 07/02/2022   Compression fracture of T7 vertebra ([name]) 07/02/2022   Thrombocytopenia 07/02/2022   Anxiety 07/02/2022    PCP: Does not currently have a PCP, but seeing other Rheumatologist regularly  REFERRING PROVIDER: Deward Eck, PA-C  REFERRING DIAG: D27.990J (ICD-10-CM) - Hip fracture ([name])  THERAPY DIAG:  Pain in left leg  Difficulty in walking, not elsewhere classified  Muscle weakness (generalized)  Other low back pain  Rationale for Evaluation and Treatment: Rehabilitation  ONSET DATE: 09/18/2024  SUBJECTIVE:   SUBJECTIVE STATEMENT: Some Lt hip pain with movement and walking.    PERTINENT HISTORY: Hx of T7 compression Fracture, Hx of left tibial plateau Fx with multiple surgeries in 2023, Psoriatic arthritis, Thrombocytopenia PAIN: 10/15/24 Are you having pain? Yes: NPRS scale: 3-6/10 Pain location: left hip  Pain description: aching on the verge of sharp Aggravating factors: weight bearing Relieving factors: rest  PRECAUTIONS: None  RED FLAGS: None   WEIGHT BEARING RESTRICTIONS: WBAT with walker or crutches  FALLS:  Has patient fallen in last 6 months? Yes. Number of falls 1  LIVING ENVIRONMENT: Lives with: lives with their family Lives in: House/apartment Stairs: two level, but staying on the bottom floor Has following equipment at home: Vannie - 2 wheeled, Crutches, Wheelchair (manual), shower chair, and Grab bars  OCCUPATION: Works for the [employer], Also works as a architectural technologist  PLOF: Independent and Leisure: hiking, working out  PATIENT GOALS: To return back to normal activities and active lifestyle.  NEXT MD VISIT: November 19, 2024 with Rheumatology with Dr Jeannetta  OBJECTIVE:  Note: Objective measures were completed at Evaluation unless otherwise noted.  DIAGNOSTIC FINDINGS:  Left Femur Radiograph on 09/19/2024: IMPRESSION: ORIF of proximal femur  fracture, without immediate postoperative complication.  PATIENT SURVEYS:  LEFS  Extreme difficulty/unable (0), Quite a bit of difficulty (1), Moderate difficulty (2), Little difficulty (3), No difficulty (4) Survey date:  10/01/24  Any of your usual work, housework or school activities 2  2. Usual hobbies, recreational or sporting activities 0  3. Getting into/out of the bath 3  4. Walking between rooms 2  5. Putting on socks/shoes 4  6. Squatting  0  7. Lifting an object, like a bag of groceries from the floor 1  8. Performing light activities around your home 3  9. Performing heavy activities around your home 0  10. Getting into/out of a car 3  11. Walking 2 blocks 0  12. Walking 1 mile 0  13. Going up/down 10 stairs (1 flight) 0  14. Standing for 1 hour 0  15.  sitting for 1 hour 4  16. Running on even ground 0  17. Running on uneven ground 0  18. Making sharp turns while running fast 0  19. Hopping  0  20. Rolling over in bed 2  Score total:  24/80 = 30%     COGNITION: Overall cognitive status: Within functional limits for tasks assessed     SENSATION: Patient denies numbness and tingling  MUSCLE LENGTH: Hamstrings:  minimal tightness noted bilaterally  POSTURE: rounded shoulders  PALPATION: Tender to palpation along sight of surgery  LOWER EXTREMITY ROM:  Eval: WFL, but with pain at left hip  LOWER EXTREMITY MMT:  Eval: Right LE strength is WFL Left hip strength is 3/5 Left quad strength is 4-/5 Left hamstring strength is 4-/5   FUNCTIONAL TESTS:  Eval: 5 times sit to stand: 10.53 sec with unilateral hand on the RW Timed up and go (TUG): 13.24 sec with RW  GAIT: Distance walked: >100 ft Assistive device utilized: Environmental Consultant - 2 wheeled Level of assistance: SBA Comments: Decreased weight bearing through LLE with antalgic gait noted                                                                                                                                 TREATMENT DATE:  10/15/2024: Nustep level 5 x7 min with PT present to discuss status Seated LAQ 2x10 left LE- added 2# ankle weight Seated hamstring stretch 2x20 seconds bil  Sit to stand x10 with Rt=Lt  Seated marching 2x10 with 2# ankle weight on Lt  Standing weight shift on balance pad 3 ways- 1 min each- required verbal cues for technique Sidestepping at barre x 3 laps  Standing rockerboard x2 min Standing hip abduction x10 and extension x 10 bil Gait with rolling walker around building with focus on step length, heel strike and Rt=Lt weightbearing.  PT adjusted walker as it was too high.  Verbal cues to reduce scapular elevation with gait.    10/01/2024: Reviewed HEP and discussed role of PT  Nustep level 1 x6 min with PT present to discuss status Seated LAQ 2x10 left LE Seated marching 2x5 bilat Seated hip adduction ball squeeze 2x10   PATIENT EDUCATION:  Education details: Issued HEP Person educated: Patient Education method: Explanation, Facilities Manager, and Handouts Education comprehension: verbalized understanding  HOME EXERCISE PROGRAM: Access Code: [email or web address] URL: [email or web address] Date: 10/01/2024 Prepared by: Jarrell Menke  Exercises - Sit to Stand with Armchair  - 1 x daily - 7 x weekly - 2 sets - 10 reps - Long Sitting Quad Set  - 1 x daily - 7 x weekly - 2 sets - 10 reps - Supine Active Straight Leg Raise  - 1 x daily - 7 x weekly - 2 sets - 10 reps - Supine Bridge  - 1 x daily - 7 x weekly - 2 sets - 10 reps - Clamshell  - 1 x daily - 7 x weekly - 2 sets - 10 reps - Sidelying Hip Abduction  - 1 x daily - 7 x weekly - 2 sets - 10 reps - Standing Heel Raise  - 1 x daily - 7 x weekly - 2 sets - 20 reps - Standing March with Counter Support  - 1 x daily - 7 x weekly - 1-2 sets - 10 reps  ASSESSMENT:  CLINICAL IMPRESSION: First time follow-up after evaluation.  Pt reports compliance with HEP and is working to normalize gait pattern.   PT adjusted walker to appropriate height and provided cueing for gait symmetry and to relax shoulders.  Pt requires verbal cueing for alignment and exercises in clinic He fatigues quickly with standing on Lt LE.   Patient would benefit from skilled PT to progress towards goal related activities.   OBJECTIVE IMPAIRMENTS: Abnormal gait, decreased activity tolerance, decreased balance, difficulty walking, decreased strength, increased muscle spasms, impaired flexibility, and pain.   ACTIVITY LIMITATIONS: carrying, lifting, bending, standing, squatting, sleeping, stairs, transfers, and locomotion level  PARTICIPATION LIMITATIONS: meal prep, cleaning, driving, community activity, and occupation  PERSONAL FACTORS: Fitness, Past/current experiences, and 3+ comorbidities: Hx of left tibial plateau Fx,  Psoriatic arthritis, Thrombocytopenia are also affecting patient's functional outcome.   REHAB POTENTIAL: Good  CLINICAL DECISION MAKING: Evolving/moderate complexity  EVALUATION COMPLEXITY: Moderate   GOALS: Goals reviewed with patient? Yes  SHORT TERM GOALS: Target date: 10/25/2024 Pt will be independent with initial HEP.  Baseline: Goal status: INITIAL  2.  Patient will participate in a 6 minute walk test to establish a baseline measurement. Baseline:  Goal status: INITIAL  3.  Patient will report at least a 25% improvement in symptoms since starting PT. Baseline:  Goal status: INITIAL   LONG TERM GOALS: Target date: 11/22/2024  Pt will be independent with advanced HEP to allow for self progression after discharge. Baseline:  Goal status: INITIAL  2.  Pt will report no increase in pain with ambulation with LRAD when walking around the grocery store.  Baseline:  Goal status: INITIAL  3.  Patient will improve Lower Extremity Functional Status to at least 55% to demonstrate improved functional mobility. Baseline: 30% Goal status: INITIAL  4.  Patient will report ability to return  to hiking without increased pain. Baseline:  Goal status: INITIAL  5.  Patient to increase LE strength to [facility] to allow him to negotiate stairs with reciprocal pattern. Baseline:  Goal status: INITIAL     PLAN:  PT FREQUENCY: 1-2x/week  PT DURATION: 8 weeks  PLANNED INTERVENTIONS: [id number]- PT Re-evaluation, 97750- Physical Performance Testing, 97110-Therapeutic exercises, 97530- Therapeutic activity, [id number]- Neuromuscular re-education, 97535- Self Care, [id number]- Manual therapy, [id number]- Gait training, [phone]- Canalith repositioning, [id number]- Aquatic Therapy, [phone]- Electrical stimulation (unattended), [phone]- Electrical stimulation (manual), [id number]- Vasopneumatic device, [id number]- Ultrasound, [id number]- Ionotophoresis 4mg /ml Dexamethasone , 20560 (1-2 muscles), [id number] (3+ muscles)- Dry Needling, Patient/Family education, Balance training, Stair training, Taping, Joint mobilization, Joint manipulation, Scar mobilization, Vestibular training, Cryotherapy, and Moist heat  PLAN FOR NEXT SESSION: Assess and progress HEP as indicated, strengthening, flexibility, gait training. 6 min walk test    Burnard Joy, PT 10/15/24 12:36 PM   [facility] [address] [place], [place] [address] Phone # [phone] Fax [phone]

## 2024-10-17 ENCOUNTER — Ambulatory Visit

## 2024-10-22 ENCOUNTER — Ambulatory Visit

## 2024-10-22 DIAGNOSIS — M5459 Other low back pain: Secondary | ICD-10-CM

## 2024-10-22 DIAGNOSIS — M79605 Pain in left leg: Secondary | ICD-10-CM | POA: Diagnosis not present

## 2024-10-22 DIAGNOSIS — M6281 Muscle weakness (generalized): Secondary | ICD-10-CM

## 2024-10-22 DIAGNOSIS — R262 Difficulty in walking, not elsewhere classified: Secondary | ICD-10-CM

## 2024-10-22 NOTE — Therapy (Signed)
 OUTPATIENT PHYSICAL THERAPY LOWER EXTREMITY EVALUATION   Patient Name: Ryan Lynch MRN: [id number] DOB:[birth year], 43 y.o., male Today's Date: 10/22/2024  END OF SESSION:  PT End of Session - 10/22/24 1227     Visit Number 3    Date for Recertification  11/22/24    Authorization Type BC/BS    PT Start Time 1149    PT Stop Time 1228    PT Time Calculation (min) 39 min    Activity Tolerance Patient tolerated treatment well    Behavior During Therapy [facility] for tasks assessed/performed            Past Medical History:  Diagnosis Date   Acute anemia 06/2022   Acute metabolic encephalopathy 06/2022   Acute urinary retention 06/2022   ADD (attention deficit disorder)    Adrenal nodule 06/2022   AKI (acute kidney injury) 06/2022   Broken arm 09/2023   Broken humerus    Closed nondisplaced fracture of left tibial plateau 07/2022   Multiple rib fractures 06/2022   Psoriasis    Psoriatic arthritis ([name])    Thrombocytopenia 06/2022   Traumatic rhabdomyolysis 06/2022   Past Surgical History:  Procedure Laterality Date   ANTERIOR CRUCIATE LIGAMENT REPAIR Right    ANTERIOR CRUCIATE LIGAMENT REPAIR Right    APPLICATION OF WOUND VAC Left 07/02/2022   Procedure: APPLICATION OF WOUND VAC;  Surgeon: Celena Sharper, MD;  Location: [facility] OR;  Service: Orthopedics;  Laterality: Left;   APPLICATION OF WOUND VAC Left 07/05/2022   Procedure: APPLICATION OF WOUND VAC;  Surgeon: Celena Sharper, MD;  Location: [facility] OR;  Service: Orthopedics;  Laterality: Left;   CLOSED REDUCTION TIBIA Left 07/02/2022   Procedure: CLOSED REDUCTION TIBIA;  Surgeon: Celena Sharper, MD;  Location: [facility] OR;  Service: Orthopedics;  Laterality: Left;   EXTERNAL FIXATION LEG Left 07/02/2022   Procedure: EXTERNAL FIXATION LEG;  Surgeon: Celena Sharper, MD;  Location: [facility] OR;  Service: Orthopedics;  Laterality: Left;   FASCIOTOMY Left 07/02/2022   Procedure: FASCIOTOMY;  Surgeon: Celena Sharper, MD;  Location: [facility] OR;  Service:  Orthopedics;  Laterality: Left;   I & D EXTREMITY Left 07/07/2022   Procedure: IRRIGATION AND DEBRIDEMENT EXTREMITY;  Surgeon: Celena Sharper, MD;  Location: [facility] OR;  Service: Orthopedics;  Laterality: Left;   INTRAMEDULLARY (IM) NAIL INTERTROCHANTERIC Left 09/19/2024   Procedure: FIXATION, FRACTURE, INTERTROCHANTERIC, WITH INTRAMEDULLARY ROD;  Surgeon: Celena Sharper, MD;  Location: [facility] OR;  Service: Orthopedics;  Laterality: Left;   ORIF TIBIA PLATEAU Left 07/05/2022   Procedure: OPEN REDUCTION INTERNAL FIXATION (ORIF) TIBIAL PLATEAU;  Surgeon: Celena Sharper, MD;  Location: [facility] OR;  Service: Orthopedics;  Laterality: Left;   SECONDARY CLOSURE OF WOUND Left 07/05/2022   Procedure: SECONDARY CLOSURE OF WOUND;  Surgeon: Celena Sharper, MD;  Location: [facility] OR;  Service: Orthopedics;  Laterality: Left;   SECONDARY CLOSURE OF WOUND Left 07/07/2022   Procedure: SECONDARY CLOSURE OF WOUND;  Surgeon: Celena Sharper, MD;  Location: [facility] OR;  Service: Orthopedics;  Laterality: Left;   SHOULDER SURGERY Bilateral    WISDOM TOOTH EXTRACTION  05/23/2023   Patient Active Problem List   Diagnosis Date Noted   Femur fracture ([name]) 09/18/2024   Psoriatic arthritis ([name]) 03/20/2023   High risk medication use 03/20/2023   Vitamin D  deficiency 03/20/2023   Generalized anxiety disorder    Closed nondisplaced fracture of left tibial plateau 07/16/2022   Multiple rib fractures 07/11/2022   Compression fracture of body of thoracic vertebra ([name]) 07/11/2022   Sepsis ([name])  07/09/2022   Acute urinary retention 07/09/2022   Prolonged QT interval 07/09/2022   AKI (acute kidney injury) 07/07/2022   Alcohol withdrawal ([name]) 07/05/2022   Acute anemia 07/05/2022   Psoriasis 07/05/2022   Ileus ([name]) 07/04/2022   Acute metabolic encephalopathy 07/04/2022   Traumatic rhabdomyolysis 07/03/2022   Observation after surgery 07/02/2022   Tibial plateau fracture, left 07/02/2022   Alcohol use 07/02/2022   Transaminitis  07/02/2022   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   Adrenal nodule 07/02/2022   Compression fracture of T7 vertebra ([name]) 07/02/2022   Thrombocytopenia 07/02/2022   Anxiety 07/02/2022    PCP: Does not currently have a PCP, but seeing other Rheumatologist regularly  REFERRING PROVIDER: Deward Eck, PA-C  REFERRING DIAG: D27.990J (ICD-10-CM) - Hip fracture ([name])  THERAPY DIAG:  Pain in left leg  Difficulty in walking, not elsewhere classified  Muscle weakness (generalized)  Other low back pain  Rationale for Evaluation and Treatment: Rehabilitation  ONSET DATE: 09/18/2024  SUBJECTIVE:   SUBJECTIVE STATEMENT: I was a little sore after last session.  I needed extra days to recover.  Now I am walking with a cane.   PERTINENT HISTORY: Hx of T7 compression Fracture, Hx of left tibial plateau Fx with multiple surgeries in 2023, Psoriatic arthritis, Thrombocytopenia PAIN: 10/22/24 Are you having pain? Yes: NPRS scale: 3-4/10 Pain location: left hip  Pain description: aching on the verge of sharp, irritating Aggravating factors: weight bearing Relieving factors: rest  PRECAUTIONS: None  RED FLAGS: None   WEIGHT BEARING RESTRICTIONS: WBAT with walker or crutches  FALLS:  Has patient fallen in last 6 months? Yes. Number of falls 1  LIVING ENVIRONMENT: Lives with: lives with their family Lives in: House/apartment Stairs: two level, but staying on the bottom floor Has following equipment at home: Vannie - 2 wheeled, Crutches, Wheelchair (manual), shower chair, and Grab bars  OCCUPATION: Works for the [employer], Also works as a architectural technologist  PLOF: Independent and Leisure: hiking, working out  PATIENT GOALS: To return back to normal activities and active lifestyle.  NEXT MD VISIT: November 19, 2024 with Rheumatology with Dr Jeannetta  OBJECTIVE:  Note: Objective measures were completed at Evaluation unless otherwise noted.  DIAGNOSTIC FINDINGS:  Left  Femur Radiograph on 09/19/2024: IMPRESSION: ORIF of proximal femur fracture, without immediate postoperative complication.  PATIENT SURVEYS:  LEFS  Extreme difficulty/unable (0), Quite a bit of difficulty (1), Moderate difficulty (2), Little difficulty (3), No difficulty (4) Survey date:  10/01/24  Any of your usual work, housework or school activities 2  2. Usual hobbies, recreational or sporting activities 0  3. Getting into/out of the bath 3  4. Walking between rooms 2  5. Putting on socks/shoes 4  6. Squatting  0  7. Lifting an object, like a bag of groceries from the floor 1  8. Performing light activities around your home 3  9. Performing heavy activities around your home 0  10. Getting into/out of a car 3  11. Walking 2 blocks 0  12. Walking 1 mile 0  13. Going up/down 10 stairs (1 flight) 0  14. Standing for 1 hour 0  15.  sitting for 1 hour 4  16. Running on even ground 0  17. Running on uneven ground 0  18. Making sharp turns while running fast 0  19. Hopping  0  20. Rolling over in bed 2  Score total:  24/80 = 30%     COGNITION: Overall cognitive status: Within functional  limits for tasks assessed     SENSATION: Patient denies numbness and tingling  MUSCLE LENGTH: Hamstrings: minimal tightness noted bilaterally  POSTURE: rounded shoulders  PALPATION: Tender to palpation along sight of surgery  LOWER EXTREMITY ROM:  Eval: WFL, but with pain at left hip  LOWER EXTREMITY MMT:  Eval: Right LE strength is WFL Left hip strength is 3/5 Left quad strength is 4-/5 Left hamstring strength is 4-/5   FUNCTIONAL TESTS:  Eval: 5 times sit to stand: 10.53 sec with unilateral hand on the RW Timed up and go (TUG): 13.24 sec with RW  GAIT: Distance walked: >100 ft Assistive device utilized: Environmental Consultant - 2 wheeled Level of assistance: SBA Comments: Decreased weight bearing through LLE with antalgic gait noted                                                                                                                                 TREATMENT DATE:  10/22/2024: Nustep level 5 x with PT present to discuss status Seated hamstring stretch 2x20 seconds bil  Sit to stand 2x10 with Rt=Lt   Standing weight shift on balance pad 3 ways- 1 min each- required verbal cues for technique- heel drive on Rt with Lt step Sidestepping at barre x 3 laps  Hurdles at barre: forward with step-over-over step and sidestepping with step-to x 3 laps each  Standing rockerboard x2 min Standing hip abduction 2x10 and extension 2x10 bil Gait with cane: focus on symmetry and correct use of cane x 50 feet- use of mirror and PT provided verbal cues   10/15/2024: Nustep level 5 x7 min with PT present to discuss status Seated LAQ 2x10 left LE- added 2# ankle weight Seated hamstring stretch 2x20 seconds bil  Sit to stand x10 with Rt=Lt  Seated marching 2x10 with 2# ankle weight on Lt  Standing weight shift on balance pad 3 ways- 1 min each- required verbal cues for technique Sidestepping at barre x 3 laps  Standing rockerboard x2 min Standing hip abduction x10 and extension x 10 bil Gait with rolling walker around building with focus on step length, heel strike and Rt=Lt weightbearing.  PT adjusted walker as it was too high.  Verbal cues to reduce scapular elevation with gait.    10/01/2024: Reviewed HEP and discussed role of PT  Nustep level 1 x6 min with PT present to discuss status Seated LAQ 2x10 left LE Seated marching 2x5 bilat Seated hip adduction ball squeeze 2x10   PATIENT EDUCATION:  Education details: Issued HEP Person educated: Patient Education method: Explanation, Facilities Manager, and Handouts Education comprehension: verbalized understanding  HOME EXERCISE PROGRAM: Access Code: [email or web address] URL: [email or web address] Date: 10/01/2024 Prepared by: Jarrell Menke  Exercises - Sit to Stand with Armchair  - 1 x daily - 7 x weekly - 2  sets - 10 reps - Long Sitting Quad Set  - 1 x daily - 7 x weekly - 2 sets -  10 reps - Supine Active Straight Leg Raise  - 1 x daily - 7 x weekly - 2 sets - 10 reps - Supine Bridge  - 1 x daily - 7 x weekly - 2 sets - 10 reps - Clamshell  - 1 x daily - 7 x weekly - 2 sets - 10 reps - Sidelying Hip Abduction  - 1 x daily - 7 x weekly - 2 sets - 10 reps - Standing Heel Raise  - 1 x daily - 7 x weekly - 2 sets - 20 reps - Standing March with Counter Support  - 1 x daily - 7 x weekly - 1-2 sets - 10 reps  ASSESSMENT:  CLINICAL IMPRESSION: Pt arrived using cane and demonstrates good form and symmetry.  He was fatigued after last session and needed additional days to recover from session.  He is challenged by current level of exercise and PT monitored for safety, pain and technique.  Weightbearing into the Lt LE is improved since the start of care.  Patient would benefit from skilled PT to progress towards goal related activities.   OBJECTIVE IMPAIRMENTS: Abnormal gait, decreased activity tolerance, decreased balance, difficulty walking, decreased strength, increased muscle spasms, impaired flexibility, and pain.   ACTIVITY LIMITATIONS: carrying, lifting, bending, standing, squatting, sleeping, stairs, transfers, and locomotion level  PARTICIPATION LIMITATIONS: meal prep, cleaning, driving, community activity, and occupation  PERSONAL FACTORS: Fitness, Past/current experiences, and 3+ comorbidities: Hx of left tibial plateau Fx,  Psoriatic arthritis, Thrombocytopenia are also affecting patient's functional outcome.   REHAB POTENTIAL: Good  CLINICAL DECISION MAKING: Evolving/moderate complexity  EVALUATION COMPLEXITY: Moderate   GOALS: Goals reviewed with patient? Yes  SHORT TERM GOALS: Target date: 10/25/2024 Pt will be independent with initial HEP.  Baseline: 10/22/24 Goal status: MET  2.  Patient will participate in a 6 minute walk test to establish a baseline measurement. Baseline:   Goal status: INITIAL  3.  Patient will report at least a 25% improvement in symptoms since starting PT. Baseline: 10/22/24 Goal status: in progress    LONG TERM GOALS: Target date: 11/22/2024  Pt will be independent with advanced HEP to allow for self progression after discharge. Baseline:  Goal status: INITIAL  2.  Pt will report no increase in pain with ambulation with LRAD when walking around the grocery store.  Baseline:  Goal status: INITIAL  3.  Patient will improve Lower Extremity Functional Status to at least 55% to demonstrate improved functional mobility. Baseline: 30% Goal status: INITIAL  4.  Patient will report ability to return to hiking without increased pain. Baseline:  Goal status: INITIAL  5.  Patient to increase LE strength to [facility] to allow him to negotiate stairs with reciprocal pattern. Baseline:  Goal status: INITIAL     PLAN:  PT FREQUENCY: 1-2x/week  PT DURATION: 8 weeks  PLANNED INTERVENTIONS: [id number]- PT Re-evaluation, 97750- Physical Performance Testing, 97110-Therapeutic exercises, 97530- Therapeutic activity, [id number]- Neuromuscular re-education, 97535- Self Care, [id number]- Manual therapy, [id number]- Gait training, [phone]- Canalith repositioning, [id number]- Aquatic Therapy, [phone]- Electrical stimulation (unattended), [phone]- Electrical stimulation (manual), [id number]- Vasopneumatic device, [id number]- Ultrasound, [id number]- Ionotophoresis 4mg /ml Dexamethasone , [id number] (1-2 muscles), [id number] (3+ muscles)- Dry Needling, Patient/Family education, Balance training, Stair training, Taping, Joint mobilization, Joint manipulation, Scar mobilization, Vestibular training, Cryotherapy, and Moist heat  PLAN FOR NEXT SESSION: Assess and progress HEP as indicated, strengthening, flexibility, gait training. 6 min walk test next visit- with cane or walker    Burnard Joy, PT 10/22/24 12:32 PM  [facility] [address] [place], [place] [address] Phone #  [phone] Fax [phone]

## 2024-10-23 ENCOUNTER — Other Ambulatory Visit: Payer: Self-pay

## 2024-10-24 ENCOUNTER — Ambulatory Visit

## 2024-10-24 DIAGNOSIS — M5459 Other low back pain: Secondary | ICD-10-CM

## 2024-10-24 DIAGNOSIS — M79605 Pain in left leg: Secondary | ICD-10-CM | POA: Diagnosis not present

## 2024-10-24 DIAGNOSIS — M6281 Muscle weakness (generalized): Secondary | ICD-10-CM

## 2024-10-24 DIAGNOSIS — R262 Difficulty in walking, not elsewhere classified: Secondary | ICD-10-CM

## 2024-10-24 NOTE — Therapy (Signed)
 OUTPATIENT PHYSICAL THERAPY LOWER EXTREMITY EVALUATION   Patient Name: Ryan Lynch MRN: [id number] DOB:20-Jun-1981, 43 y.o., male Today's Date: 10/24/2024  END OF SESSION:  PT End of Session - 10/24/24 1234     Visit Number 4    Date for Recertification  11/22/24    Authorization Type BC/BS    PT Start Time 1148    PT Stop Time 1230    PT Time Calculation (min) 42 min    Activity Tolerance Patient tolerated treatment well    Behavior During Therapy [facility] for tasks assessed/performed             Past Medical History:  Diagnosis Date   Acute anemia 06/2022   Acute metabolic encephalopathy 06/2022   Acute urinary retention 06/2022   ADD (attention deficit disorder)    Adrenal nodule 06/2022   AKI (acute kidney injury) 06/2022   Broken arm 09/2023   Broken humerus    Closed nondisplaced fracture of left tibial plateau 07/2022   Multiple rib fractures 06/2022   Psoriasis    Psoriatic arthritis ([name])    Thrombocytopenia 06/2022   Traumatic rhabdomyolysis 06/2022   Past Surgical History:  Procedure Laterality Date   ANTERIOR CRUCIATE LIGAMENT REPAIR Right    ANTERIOR CRUCIATE LIGAMENT REPAIR Right    APPLICATION OF WOUND VAC Left 07/02/2022   Procedure: APPLICATION OF WOUND VAC;  Surgeon: Celena Sharper, MD;  Location: [facility] OR;  Service: Orthopedics;  Laterality: Left;   APPLICATION OF WOUND VAC Left 07/05/2022   Procedure: APPLICATION OF WOUND VAC;  Surgeon: Celena Sharper, MD;  Location: [facility] OR;  Service: Orthopedics;  Laterality: Left;   CLOSED REDUCTION TIBIA Left 07/02/2022   Procedure: CLOSED REDUCTION TIBIA;  Surgeon: Celena Sharper, MD;  Location: [facility] OR;  Service: Orthopedics;  Laterality: Left;   EXTERNAL FIXATION LEG Left 07/02/2022   Procedure: EXTERNAL FIXATION LEG;  Surgeon: Celena Sharper, MD;  Location: [facility] OR;  Service: Orthopedics;  Laterality: Left;   FASCIOTOMY Left 07/02/2022   Procedure: FASCIOTOMY;  Surgeon: Celena Sharper, MD;  Location: [facility] OR;   Service: Orthopedics;  Laterality: Left;   I & D EXTREMITY Left 07/07/2022   Procedure: IRRIGATION AND DEBRIDEMENT EXTREMITY;  Surgeon: Celena Sharper, MD;  Location: [facility] OR;  Service: Orthopedics;  Laterality: Left;   INTRAMEDULLARY (IM) NAIL INTERTROCHANTERIC Left 09/19/2024   Procedure: FIXATION, FRACTURE, INTERTROCHANTERIC, WITH INTRAMEDULLARY ROD;  Surgeon: Celena Sharper, MD;  Location: [facility] OR;  Service: Orthopedics;  Laterality: Left;   ORIF TIBIA PLATEAU Left 07/05/2022   Procedure: OPEN REDUCTION INTERNAL FIXATION (ORIF) TIBIAL PLATEAU;  Surgeon: Celena Sharper, MD;  Location: [facility] OR;  Service: Orthopedics;  Laterality: Left;   SECONDARY CLOSURE OF WOUND Left 07/05/2022   Procedure: SECONDARY CLOSURE OF WOUND;  Surgeon: Celena Sharper, MD;  Location: [facility] OR;  Service: Orthopedics;  Laterality: Left;   SECONDARY CLOSURE OF WOUND Left 07/07/2022   Procedure: SECONDARY CLOSURE OF WOUND;  Surgeon: Celena Sharper, MD;  Location: [facility] OR;  Service: Orthopedics;  Laterality: Left;   SHOULDER SURGERY Bilateral    WISDOM TOOTH EXTRACTION  05/23/2023   Patient Active Problem List   Diagnosis Date Noted   Femur fracture ([name]) 09/18/2024   Psoriatic arthritis ([name]) 03/20/2023   High risk medication use 03/20/2023   Vitamin D  deficiency 03/20/2023   Generalized anxiety disorder    Closed nondisplaced fracture of left tibial plateau 07/16/2022   Multiple rib fractures 07/11/2022   Compression fracture of body of thoracic vertebra ([name]) 07/11/2022   Sepsis (  [name]) 07/09/2022   Acute urinary retention 07/09/2022   Prolonged QT interval 07/09/2022   AKI (acute kidney injury) 07/07/2022   Alcohol withdrawal ([name]) 07/05/2022   Acute anemia 07/05/2022   Psoriasis 07/05/2022   Ileus ([name]) 07/04/2022   Acute metabolic encephalopathy 07/04/2022   Traumatic rhabdomyolysis 07/03/2022   Observation after surgery 07/02/2022   Tibial plateau fracture, left 07/02/2022   Alcohol use 07/02/2022   Transaminitis  07/02/2022   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   Adrenal nodule 07/02/2022   Compression fracture of T7 vertebra ([name]) 07/02/2022   Thrombocytopenia 07/02/2022   Anxiety 07/02/2022    PCP: Does not currently have a PCP, but seeing other Rheumatologist regularly  REFERRING PROVIDER: Deward Eck, PA-C  REFERRING DIAG: D27.990J (ICD-10-CM) - Hip fracture ([name])  THERAPY DIAG:  Pain in left leg  Difficulty in walking, not elsewhere classified  Muscle weakness (generalized)  Other low back pain  Rationale for Evaluation and Treatment: Rehabilitation  ONSET DATE: 09/18/2024  SUBJECTIVE:   SUBJECTIVE STATEMENT: I was a little sore after last session.  I needed extra days to recover.  Now I am walking with a cane.   PERTINENT HISTORY: Hx of T7 compression Fracture, Hx of left tibial plateau Fx with multiple surgeries in 2023, Psoriatic arthritis, Thrombocytopenia PAIN: 10/22/24 Are you having pain? Yes: NPRS scale: 3-4/10 Pain location: left hip  Pain description: aching on the verge of sharp, irritating Aggravating factors: weight bearing Relieving factors: rest  PRECAUTIONS: None  RED FLAGS: None   WEIGHT BEARING RESTRICTIONS: WBAT with walker or crutches  FALLS:  Has patient fallen in last 6 months? Yes. Number of falls 1  LIVING ENVIRONMENT: Lives with: lives with their family Lives in: House/apartment Stairs: two level, but staying on the bottom floor Has following equipment at home: Vannie - 2 wheeled, Crutches, Wheelchair (manual), shower chair, and Grab bars  OCCUPATION: Works for the [employer], Also works as a architectural technologist  PLOF: Independent and Leisure: hiking, working out  PATIENT GOALS: To return back to normal activities and active lifestyle.  NEXT MD VISIT: November 19, 2024 with Rheumatology with Dr Jeannetta  OBJECTIVE:  Note: Objective measures were completed at Evaluation unless otherwise noted.  DIAGNOSTIC FINDINGS:  Left  Femur Radiograph on 09/19/2024: IMPRESSION: ORIF of proximal femur fracture, without immediate postoperative complication.  PATIENT SURVEYS:  LEFS  Extreme difficulty/unable (0), Quite a bit of difficulty (1), Moderate difficulty (2), Little difficulty (3), No difficulty (4) Survey date:  10/01/24  Any of your usual work, housework or school activities 2  2. Usual hobbies, recreational or sporting activities 0  3. Getting into/out of the bath 3  4. Walking between rooms 2  5. Putting on socks/shoes 4  6. Squatting  0  7. Lifting an object, like a bag of groceries from the floor 1  8. Performing light activities around your home 3  9. Performing heavy activities around your home 0  10. Getting into/out of a car 3  11. Walking 2 blocks 0  12. Walking 1 mile 0  13. Going up/down 10 stairs (1 flight) 0  14. Standing for 1 hour 0  15.  sitting for 1 hour 4  16. Running on even ground 0  17. Running on uneven ground 0  18. Making sharp turns while running fast 0  19. Hopping  0  20. Rolling over in bed 2  Score total:  24/80 = 30%     COGNITION: Overall cognitive status: Within  functional limits for tasks assessed     SENSATION: Patient denies numbness and tingling  MUSCLE LENGTH: Hamstrings: minimal tightness noted bilaterally  POSTURE: rounded shoulders  PALPATION: Tender to palpation along sight of surgery  LOWER EXTREMITY ROM:  Eval: WFL, but with pain at left hip  LOWER EXTREMITY MMT:  Eval: Right LE strength is WFL Left hip strength is 3/5 Left quad strength is 4-/5 Left hamstring strength is 4-/5   FUNCTIONAL TESTS:  Eval: 5 times sit to stand: 10.53 sec with unilateral hand on the RW Timed up and go (TUG): 13.24 sec with RW  10/24/24: 6 min walk test with cane 1140 ft with 2-3/10 RPE TUG: with cane 7.26 seconds   GAIT: Distance walked: >100 ft Assistive device utilized: Environmental Consultant - 2 wheeled Level of assistance: SBA Comments: Decreased weight  bearing through LLE with antalgic gait noted                                                                                                                                TREATMENT DATE:  10/24/2024: Bike: level 4x 7 minutes (2 miles)-PT monitored for symptoms  Seated hamstring stretch 2x20 seconds bil  Sit to stand 2x10 with Rt=Lt   Standing weight shift on balance pad 3 ways- 1 min each- heel drive on Rt with Lt step Sidestepping at barre x 3 laps- green band around ankles  Hurdles at barre: forward with step-over-over step and sidestepping with step-to x 3 laps each  Standing rockerboard x3 min Standing hip abduction 2x10 and extension 2x10 bil- standing on balance pad 6 min walk test: pt used cane 1140 ft with 2-3/10 RPE  10/22/2024: Nustep level 5 x with PT present to discuss status Seated hamstring stretch 2x20 seconds bil  Sit to stand 2x10 with Rt=Lt   Standing weight shift on balance pad 3 ways- 1 min each- required verbal cues for technique- heel drive on Rt with Lt step Sidestepping at barre x 3 laps  Hurdles at barre: forward with step-over-over step and sidestepping with step-to x 3 laps each  Standing rockerboard x2 min Standing hip abduction 2x10 and extension 2x10 bil Gait with cane: focus on symmetry and correct use of cane x 50 feet- use of mirror and PT provided verbal cues   10/15/2024: Nustep level 5 x7 min with PT present to discuss status Seated LAQ 2x10 left LE- added 2# ankle weight Seated hamstring stretch 2x20 seconds bil  Sit to stand x10 with Rt=Lt  Seated marching 2x10 with 2# ankle weight on Lt  Standing weight shift on balance pad 3 ways- 1 min each- required verbal cues for technique Sidestepping at barre x 3 laps  Standing rockerboard x2 min Standing hip abduction x10 and extension x 10 bil Gait with rolling walker around building with focus on step length, heel strike and Rt=Lt weightbearing.  PT adjusted walker as it was too high.  Verbal  cues  to reduce scapular elevation with gait.     PATIENT EDUCATION:  Education details: Issued HEP Person educated: Patient Education method: Explanation, Facilities Manager, and Handouts Education comprehension: verbalized understanding  HOME EXERCISE PROGRAM: Access Code: [email or web address] URL: [email or web address] Date: 10/01/2024 Prepared by: Jarrell Menke  Exercises - Sit to Stand with Armchair  - 1 x daily - 7 x weekly - 2 sets - 10 reps - Long Sitting Quad Set  - 1 x daily - 7 x weekly - 2 sets - 10 reps - Supine Active Straight Leg Raise  - 1 x daily - 7 x weekly - 2 sets - 10 reps - Supine Bridge  - 1 x daily - 7 x weekly - 2 sets - 10 reps - Clamshell  - 1 x daily - 7 x weekly - 2 sets - 10 reps - Sidelying Hip Abduction  - 1 x daily - 7 x weekly - 2 sets - 10 reps - Standing Heel Raise  - 1 x daily - 7 x weekly - 2 sets - 20 reps - Standing March with Counter Support  - 1 x daily - 7 x weekly - 1-2 sets - 10 reps  ASSESSMENT:  CLINICAL IMPRESSION: Pt tolerated last session well.  He is walking all community distances with a cane and symmetry/form is improving.  Baseline for 6 min walk test obtained today.  He did well with addition of proprioceptive tasks this week.  TUG is significantly improved. PT monitored throughout for technique and cueing.   Patient would benefit from skilled PT to progress towards goal related activities.   OBJECTIVE IMPAIRMENTS: Abnormal gait, decreased activity tolerance, decreased balance, difficulty walking, decreased strength, increased muscle spasms, impaired flexibility, and pain.   ACTIVITY LIMITATIONS: carrying, lifting, bending, standing, squatting, sleeping, stairs, transfers, and locomotion level  PARTICIPATION LIMITATIONS: meal prep, cleaning, driving, community activity, and occupation  PERSONAL FACTORS: Fitness, Past/current experiences, and 3+ comorbidities: Hx of left tibial plateau Fx,  Psoriatic arthritis, Thrombocytopenia  are also affecting patient's functional outcome.   REHAB POTENTIAL: Good  CLINICAL DECISION MAKING: Evolving/moderate complexity  EVALUATION COMPLEXITY: Moderate   GOALS: Goals reviewed with patient? Yes  SHORT TERM GOALS: Target date: 10/25/2024 Pt will be independent with initial HEP.  Baseline: 10/22/24 Goal status: MET  2.  Patient will participate in a 6 minute walk test to establish a baseline measurement. Baseline: 10/24/24 Goal status: MET  3.  Patient will report at least a 25% improvement in symptoms since starting PT. Baseline: 10/22/24 Goal status: in progress    LONG TERM GOALS: Target date: 11/22/2024  Pt will be independent with advanced HEP to allow for self progression after discharge. Baseline:  Goal status: INITIAL  2.  Pt will report no increase in pain with ambulation with LRAD when walking around the grocery store.  Baseline:  Goal status: INITIAL  3.  Patient will improve Lower Extremity Functional Status to at least 55% to demonstrate improved functional mobility. Baseline: 30% Goal status: INITIAL  4.  Patient will report ability to return to hiking without increased pain. Baseline:  Goal status: INITIAL  5.  Patient to increase LE strength to [facility] to allow him to negotiate stairs with reciprocal pattern. Baseline:  Goal status: INITIAL     PLAN:  PT FREQUENCY: 1-2x/week  PT DURATION: 8 weeks  PLANNED INTERVENTIONS: [id number]- PT Re-evaluation, 97750- Physical Performance Testing, 97110-Therapeutic exercises, 97530- Therapeutic activity, [id number]- Neuromuscular re-education, 97535- Self Care, 02859- Manual therapy, [id number]- Gait training, [phone]- Canalith repositioning,  02886- Aquatic Therapy, G0283- Electrical stimulation (unattended), [phone]- Electrical stimulation (manual), 02983- Vasopneumatic device, [id number]- Ultrasound, [id number]- Ionotophoresis 4mg /ml Dexamethasone , 20560 (1-2 muscles), 20561 (3+ muscles)- Dry Needling, Patient/Family education,  Balance training, Stair training, Taping, Joint mobilization, Joint manipulation, Scar mobilization, Vestibular training, Cryotherapy, and Moist heat  PLAN FOR NEXT SESSION: Assess and progress HEP as indicated, strengthening, flexibility, gait training.   Burnard Joy, PT 10/24/2024 12:35 PM   [facility] [address], [address] [place], [place] [address] Phone # [phone] Fax [phone]

## 2024-10-25 ENCOUNTER — Other Ambulatory Visit: Payer: Self-pay

## 2024-10-29 ENCOUNTER — Ambulatory Visit

## 2024-10-29 ENCOUNTER — Other Ambulatory Visit: Payer: Self-pay

## 2024-10-29 DIAGNOSIS — M6281 Muscle weakness (generalized): Secondary | ICD-10-CM

## 2024-10-29 DIAGNOSIS — M79605 Pain in left leg: Secondary | ICD-10-CM

## 2024-10-29 DIAGNOSIS — R262 Difficulty in walking, not elsewhere classified: Secondary | ICD-10-CM

## 2024-10-29 DIAGNOSIS — M5459 Other low back pain: Secondary | ICD-10-CM

## 2024-10-29 NOTE — Therapy (Signed)
 OUTPATIENT PHYSICAL THERAPY LOWER EXTREMITY EVALUATION   Patient Name: Ryan Lynch MRN: [id number] DOB:1980-12-25, 43 y.o., male Today's Date: 10/29/2024  END OF SESSION:  PT End of Session - 10/29/24 1234     Visit Number 5    Date for Recertification  11/22/24    Authorization Type BC/BS    PT Start Time 1147    PT Stop Time 1230    PT Time Calculation (min) 43 min    Activity Tolerance Patient tolerated treatment well    Behavior During Therapy [facility] for tasks assessed/performed              Past Medical History:  Diagnosis Date   Acute anemia 06/2022   Acute metabolic encephalopathy 06/2022   Acute urinary retention 06/2022   ADD (attention deficit disorder)    Adrenal nodule 06/2022   AKI (acute kidney injury) 06/2022   Broken arm 09/2023   Broken humerus    Closed nondisplaced fracture of left tibial plateau 07/2022   Multiple rib fractures 06/2022   Psoriasis    Psoriatic arthritis ([name])    Thrombocytopenia 06/2022   Traumatic rhabdomyolysis 06/2022   Past Surgical History:  Procedure Laterality Date   ANTERIOR CRUCIATE LIGAMENT REPAIR Right    ANTERIOR CRUCIATE LIGAMENT REPAIR Right    APPLICATION OF WOUND VAC Left 07/02/2022   Procedure: APPLICATION OF WOUND VAC;  Surgeon: Celena Sharper, MD;  Location: [facility] OR;  Service: Orthopedics;  Laterality: Left;   APPLICATION OF WOUND VAC Left 07/05/2022   Procedure: APPLICATION OF WOUND VAC;  Surgeon: Celena Sharper, MD;  Location: [facility] OR;  Service: Orthopedics;  Laterality: Left;   CLOSED REDUCTION TIBIA Left 07/02/2022   Procedure: CLOSED REDUCTION TIBIA;  Surgeon: Celena Sharper, MD;  Location: [facility] OR;  Service: Orthopedics;  Laterality: Left;   EXTERNAL FIXATION LEG Left 07/02/2022   Procedure: EXTERNAL FIXATION LEG;  Surgeon: Celena Sharper, MD;  Location: [facility] OR;  Service: Orthopedics;  Laterality: Left;   FASCIOTOMY Left 07/02/2022   Procedure: FASCIOTOMY;  Surgeon: Celena Sharper, MD;  Location: [facility] OR;   Service: Orthopedics;  Laterality: Left;   I & D EXTREMITY Left 07/07/2022   Procedure: IRRIGATION AND DEBRIDEMENT EXTREMITY;  Surgeon: Celena Sharper, MD;  Location: [facility] OR;  Service: Orthopedics;  Laterality: Left;   INTRAMEDULLARY (IM) NAIL INTERTROCHANTERIC Left 09/19/2024   Procedure: FIXATION, FRACTURE, INTERTROCHANTERIC, WITH INTRAMEDULLARY ROD;  Surgeon: Celena Sharper, MD;  Location: [facility] OR;  Service: Orthopedics;  Laterality: Left;   ORIF TIBIA PLATEAU Left 07/05/2022   Procedure: OPEN REDUCTION INTERNAL FIXATION (ORIF) TIBIAL PLATEAU;  Surgeon: Celena Sharper, MD;  Location: [facility] OR;  Service: Orthopedics;  Laterality: Left;   SECONDARY CLOSURE OF WOUND Left 07/05/2022   Procedure: SECONDARY CLOSURE OF WOUND;  Surgeon: Celena Sharper, MD;  Location: [facility] OR;  Service: Orthopedics;  Laterality: Left;   SECONDARY CLOSURE OF WOUND Left 07/07/2022   Procedure: SECONDARY CLOSURE OF WOUND;  Surgeon: Celena Sharper, MD;  Location: [facility] OR;  Service: Orthopedics;  Laterality: Left;   SHOULDER SURGERY Bilateral    WISDOM TOOTH EXTRACTION  05/23/2023   Patient Active Problem List   Diagnosis Date Noted   Femur fracture ([name]) 09/18/2024   Psoriatic arthritis ([name]) 03/20/2023   High risk medication use 03/20/2023   Vitamin D  deficiency 03/20/2023   Generalized anxiety disorder    Closed nondisplaced fracture of left tibial plateau 07/16/2022   Multiple rib fractures 07/11/2022   Compression fracture of body of thoracic vertebra ([name]) 07/11/2022  Sepsis ([name]) 07/09/2022   Acute urinary retention 07/09/2022   Prolonged QT interval 07/09/2022   AKI (acute kidney injury) 07/07/2022   Alcohol withdrawal ([name]) 07/05/2022   Acute anemia 07/05/2022   Psoriasis 07/05/2022   Ileus ([name]) 07/04/2022   Acute metabolic encephalopathy 07/04/2022   Traumatic rhabdomyolysis 07/03/2022   Observation after surgery 07/02/2022   Tibial plateau fracture, left 07/02/2022   Alcohol use 07/02/2022   Transaminitis  07/02/2022   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   Adrenal nodule 07/02/2022   Compression fracture of T7 vertebra ([name]) 07/02/2022   Thrombocytopenia 07/02/2022   Anxiety 07/02/2022    PCP: Does not currently have a PCP, but seeing other Rheumatologist regularly  REFERRING PROVIDER: Deward Eck, PA-C  REFERRING DIAG: D27.990J (ICD-10-CM) - Hip fracture ([name])  THERAPY DIAG:  Pain in left leg  Difficulty in walking, not elsewhere classified  Muscle weakness (generalized)  Other low back pain  Rationale for Evaluation and Treatment: Rehabilitation  ONSET DATE: 09/18/2024  SUBJECTIVE:   SUBJECTIVE STATEMENT: I walked more without the cane at home so I am a little sore from that.   PERTINENT HISTORY: Hx of T7 compression Fracture, Hx of left tibial plateau Fx with multiple surgeries in 2023, Psoriatic arthritis, Thrombocytopenia PAIN: 10/22/24 Are you having pain? Yes: NPRS scale: 3-4/10 Pain location: left hip  Pain description: aching on the verge of sharp, irritating Aggravating factors: weight bearing Relieving factors: rest  PRECAUTIONS: None  RED FLAGS: None   WEIGHT BEARING RESTRICTIONS: WBAT with walker or crutches  FALLS:  Has patient fallen in last 6 months? Yes. Number of falls 1  LIVING ENVIRONMENT: Lives with: lives with their family Lives in: House/apartment Stairs: two level, but staying on the bottom floor Has following equipment at home: Vannie - 2 wheeled, Crutches, Wheelchair (manual), shower chair, and Grab bars  OCCUPATION: Works for the [employer], Also works as a architectural technologist  PLOF: Independent and Leisure: hiking, working out  PATIENT GOALS: To return back to normal activities and active lifestyle.  NEXT MD VISIT: November 19, 2024 with Rheumatology with Dr Jeannetta  OBJECTIVE:  Note: Objective measures were completed at Evaluation unless otherwise noted.  DIAGNOSTIC FINDINGS:  Left Femur Radiograph on  09/19/2024: IMPRESSION: ORIF of proximal femur fracture, without immediate postoperative complication.  PATIENT SURVEYS:  LEFS  Extreme difficulty/unable (0), Quite a bit of difficulty (1), Moderate difficulty (2), Little difficulty (3), No difficulty (4) Survey date:  10/01/24  Any of your usual work, housework or school activities 2  2. Usual hobbies, recreational or sporting activities 0  3. Getting into/out of the bath 3  4. Walking between rooms 2  5. Putting on socks/shoes 4  6. Squatting  0  7. Lifting an object, like a bag of groceries from the floor 1  8. Performing light activities around your home 3  9. Performing heavy activities around your home 0  10. Getting into/out of a car 3  11. Walking 2 blocks 0  12. Walking 1 mile 0  13. Going up/down 10 stairs (1 flight) 0  14. Standing for 1 hour 0  15.  sitting for 1 hour 4  16. Running on even ground 0  17. Running on uneven ground 0  18. Making sharp turns while running fast 0  19. Hopping  0  20. Rolling over in bed 2  Score total:  24/80 = 30%     COGNITION: Overall cognitive status: Within functional limits for tasks assessed  SENSATION: Patient denies numbness and tingling  MUSCLE LENGTH: Hamstrings: minimal tightness noted bilaterally  POSTURE: rounded shoulders  PALPATION: Tender to palpation along sight of surgery  LOWER EXTREMITY ROM:  Eval: WFL, but with pain at left hip  LOWER EXTREMITY MMT:  Eval: Right LE strength is WFL Left hip strength is 3/5 Left quad strength is 4-/5 Left hamstring strength is 4-/5   FUNCTIONAL TESTS:  Eval: 5 times sit to stand: 10.53 sec with unilateral hand on the RW Timed up and go (TUG): 13.24 sec with RW  10/24/24: 6 min walk test with cane 1140 ft with 2-3/10 RPE TUG: with cane 7.26 seconds   GAIT: Distance walked: >100 ft Assistive device utilized: Environmental Consultant - 2 wheeled Level of assistance: SBA Comments: Decreased weight bearing through LLE with  antalgic gait noted                                                                                                                                TREATMENT DATE:  10/29/2024: Bike: level 4x 7 minutes (2 miles)-PT monitored for symptoms  Gait without cane using mirror as feedback, verbal and demo cues by PT for symmetry and pelvic/trunk rotation Seated hamstring stretch 2x20 seconds bil  Farmer's carry: 5# kettle bell -1 lap around the building each hand Sit to stand 2x10 with Rt=Lt  5# first set, 10# second set, staggered stance  Alternating step taps on 6 step with 5# Farmer's carry 2x10 bil each Sidestepping at barre x 3 laps- blue band around ankles  Hurdles at barre: forward with step-over-over step and sidestepping with step-to x 3 laps each  Standing rockerboard x3 min Standing hip abduction 2x10 and extension 2x10 bil- standing on balance pad   10/24/2024: Bike: level 4x 7 minutes (2 miles)-PT monitored for symptoms  Seated hamstring stretch 2x20 seconds bil  Sit to stand 2x10 with Rt=Lt   Standing weight shift on balance pad 3 ways- 1 min each- heel drive on Rt with Lt step Sidestepping at barre x 3 laps- green band around ankles  Hurdles at barre: forward with step-over-over step and sidestepping with step-to x 3 laps each  Standing rockerboard x3 min Standing hip abduction 2x10 and extension 2x10 bil- standing on balance pad 6 min walk test: pt used cane 1140 ft with 2-3/10 RPE  10/22/2024: Nustep level 5 x with PT present to discuss status Seated hamstring stretch 2x20 seconds bil  Sit to stand 2x10 with Rt=Lt   Standing weight shift on balance pad 3 ways- 1 min each- required verbal cues for technique- heel drive on Rt with Lt step Sidestepping at barre x 3 laps  Hurdles at barre: forward with step-over-over step and sidestepping with step-to x 3 laps each  Standing rockerboard x2 min Standing hip abduction 2x10 and extension 2x10 bil Gait with cane: focus on  symmetry and correct use of cane x 50 feet- use of mirror and PT  provided verbal cues   PATIENT EDUCATION:  Education details: Issued HEP Person educated: Patient Education method: Explanation, Demonstration, and Handouts Education comprehension: verbalized understanding  HOME EXERCISE PROGRAM: Access Code: [email or web address] URL: [email or web address] Date: 10/01/2024 Prepared by: Jarrell Menke  Exercises - Sit to Stand with Armchair  - 1 x daily - 7 x weekly - 2 sets - 10 reps - Long Sitting Quad Set  - 1 x daily - 7 x weekly - 2 sets - 10 reps - Supine Active Straight Leg Raise  - 1 x daily - 7 x weekly - 2 sets - 10 reps - Supine Bridge  - 1 x daily - 7 x weekly - 2 sets - 10 reps - Clamshell  - 1 x daily - 7 x weekly - 2 sets - 10 reps - Sidelying Hip Abduction  - 1 x daily - 7 x weekly - 2 sets - 10 reps - Standing Heel Raise  - 1 x daily - 7 x weekly - 2 sets - 20 reps - Standing March with Counter Support  - 1 x daily - 7 x weekly - 1-2 sets - 10 reps  ASSESSMENT:  CLINICAL IMPRESSION: Pt is making steady progress with PT.  He is walking at home without the cane and demonstrated moderate stability and symmetry with this in the clinic today. Pt required verbal cues for pelvic rotation and symmetry.   Pt did well with addition of weights and farmer's carry activities today. PT monitored throughout and provided verbal cues.    Patient would benefit from skilled PT to progress towards goal related activities.   OBJECTIVE IMPAIRMENTS: Abnormal gait, decreased activity tolerance, decreased balance, difficulty walking, decreased strength, increased muscle spasms, impaired flexibility, and pain.   ACTIVITY LIMITATIONS: carrying, lifting, bending, standing, squatting, sleeping, stairs, transfers, and locomotion level  PARTICIPATION LIMITATIONS: meal prep, cleaning, driving, community activity, and occupation  PERSONAL FACTORS: Fitness, Past/current experiences, and 3+  comorbidities: Hx of left tibial plateau Fx,  Psoriatic arthritis, Thrombocytopenia are also affecting patient's functional outcome.   REHAB POTENTIAL: Good  CLINICAL DECISION MAKING: Evolving/moderate complexity  EVALUATION COMPLEXITY: Moderate   GOALS: Goals reviewed with patient? Yes  SHORT TERM GOALS: Target date: 10/25/2024 Pt will be independent with initial HEP.  Baseline: 10/22/24 Goal status: MET  2.  Patient will participate in a 6 minute walk test to establish a baseline measurement. Baseline: 10/24/24 Goal status: MET  3.  Patient will report at least a 25% improvement in symptoms since starting PT. Baseline: 10/22/24 Goal status: in progress    LONG TERM GOALS: Target date: 11/22/2024  Pt will be independent with advanced HEP to allow for self progression after discharge. Baseline:  Goal status: INITIAL  2.  Pt will report no increase in pain with ambulation with LRAD when walking around the grocery store.  Baseline: walking at home without cane, using cane for community distances (10/29/24) Goal status: INITIAL  3.  Patient will improve Lower Extremity Functional Status to at least 55% to demonstrate improved functional mobility. Baseline: 30% Goal status: INITIAL  4.  Patient will report ability to return to hiking without increased pain. Baseline:  Goal status: INITIAL  5.  Patient to increase LE strength to [facility] to allow him to negotiate stairs with reciprocal pattern. Baseline:  Goal status: INITIAL     PLAN:  PT FREQUENCY: 1-2x/week  PT DURATION: 8 weeks  PLANNED INTERVENTIONS: 97164- PT Re-evaluation, 97750- Physical Performance Testing, 97110-Therapeutic exercises, 97530- Therapeutic activity, [id number]- Neuromuscular  re-education, [phone]- Self Care, 02859- Manual therapy, [phone]- Gait training, [phone]- Canalith repositioning, [id number]- Aquatic Therapy, [phone]- Electrical stimulation (unattended), [phone]- Electrical stimulation (manual), [id number]-  Vasopneumatic device, [id number]- Ultrasound, [id number]- Ionotophoresis 4mg /ml Dexamethasone , [id number] (1-2 muscles), [id number] (3+ muscles)- Dry Needling, Patient/Family education, Balance training, Stair training, Taping, Joint mobilization, Joint manipulation, Scar mobilization, Vestibular training, Cryotherapy, and Moist heat  PLAN FOR NEXT SESSION: Assess and progress HEP as indicated, strengthening, flexibility, gait training.   Burnard Joy, PT 10/29/2024 12:35 PM   [facility] [address] [place], [place] [address] Phone # [phone] Fax [phone]

## 2024-10-31 ENCOUNTER — Ambulatory Visit

## 2024-10-31 DIAGNOSIS — M79605 Pain in left leg: Secondary | ICD-10-CM | POA: Diagnosis not present

## 2024-10-31 DIAGNOSIS — M6281 Muscle weakness (generalized): Secondary | ICD-10-CM

## 2024-10-31 DIAGNOSIS — R262 Difficulty in walking, not elsewhere classified: Secondary | ICD-10-CM

## 2024-10-31 DIAGNOSIS — M5459 Other low back pain: Secondary | ICD-10-CM

## 2024-10-31 NOTE — Therapy (Signed)
 OUTPATIENT PHYSICAL THERAPY LOWER EXTREMITY EVALUATION   Patient Name: Ryan Lynch MRN: [id number] DOB:10-17-1981, 43 y.o., male Today's Date: 10/31/2024  END OF SESSION:  PT End of Session - 10/31/24 1230     Visit Number 6    Date for Recertification  11/22/24    Authorization Type BC/BS    PT Start Time 1146    PT Stop Time 1228    PT Time Calculation (min) 42 min    Activity Tolerance Patient tolerated treatment well    Behavior During Therapy [facility] for tasks assessed/performed               Past Medical History:  Diagnosis Date   Acute anemia 06/2022   Acute metabolic encephalopathy 06/2022   Acute urinary retention 06/2022   ADD (attention deficit disorder)    Adrenal nodule 06/2022   AKI (acute kidney injury) 06/2022   Broken arm 09/2023   Broken humerus    Closed nondisplaced fracture of left tibial plateau 07/2022   Multiple rib fractures 06/2022   Psoriasis    Psoriatic arthritis ([name])    Thrombocytopenia 06/2022   Traumatic rhabdomyolysis 06/2022   Past Surgical History:  Procedure Laterality Date   ANTERIOR CRUCIATE LIGAMENT REPAIR Right    ANTERIOR CRUCIATE LIGAMENT REPAIR Right    APPLICATION OF WOUND VAC Left 07/02/2022   Procedure: APPLICATION OF WOUND VAC;  Surgeon: Celena Sharper, MD;  Location: [facility] OR;  Service: Orthopedics;  Laterality: Left;   APPLICATION OF WOUND VAC Left 07/05/2022   Procedure: APPLICATION OF WOUND VAC;  Surgeon: Celena Sharper, MD;  Location: [facility] OR;  Service: Orthopedics;  Laterality: Left;   CLOSED REDUCTION TIBIA Left 07/02/2022   Procedure: CLOSED REDUCTION TIBIA;  Surgeon: Celena Sharper, MD;  Location: [facility] OR;  Service: Orthopedics;  Laterality: Left;   EXTERNAL FIXATION LEG Left 07/02/2022   Procedure: EXTERNAL FIXATION LEG;  Surgeon: Celena Sharper, MD;  Location: [facility] OR;  Service: Orthopedics;  Laterality: Left;   FASCIOTOMY Left 07/02/2022   Procedure: FASCIOTOMY;  Surgeon: Celena Sharper, MD;  Location: [facility] OR;   Service: Orthopedics;  Laterality: Left;   I & D EXTREMITY Left 07/07/2022   Procedure: IRRIGATION AND DEBRIDEMENT EXTREMITY;  Surgeon: Celena Sharper, MD;  Location: [facility] OR;  Service: Orthopedics;  Laterality: Left;   INTRAMEDULLARY (IM) NAIL INTERTROCHANTERIC Left 09/19/2024   Procedure: FIXATION, FRACTURE, INTERTROCHANTERIC, WITH INTRAMEDULLARY ROD;  Surgeon: Celena Sharper, MD;  Location: [facility] OR;  Service: Orthopedics;  Laterality: Left;   ORIF TIBIA PLATEAU Left 07/05/2022   Procedure: OPEN REDUCTION INTERNAL FIXATION (ORIF) TIBIAL PLATEAU;  Surgeon: Celena Sharper, MD;  Location: [facility] OR;  Service: Orthopedics;  Laterality: Left;   SECONDARY CLOSURE OF WOUND Left 07/05/2022   Procedure: SECONDARY CLOSURE OF WOUND;  Surgeon: Celena Sharper, MD;  Location: [facility] OR;  Service: Orthopedics;  Laterality: Left;   SECONDARY CLOSURE OF WOUND Left 07/07/2022   Procedure: SECONDARY CLOSURE OF WOUND;  Surgeon: Celena Sharper, MD;  Location: [facility] OR;  Service: Orthopedics;  Laterality: Left;   SHOULDER SURGERY Bilateral    WISDOM TOOTH EXTRACTION  05/23/2023   Patient Active Problem List   Diagnosis Date Noted   Femur fracture ([name]) 09/18/2024   Psoriatic arthritis ([name]) 03/20/2023   High risk medication use 03/20/2023   Vitamin D  deficiency 03/20/2023   Generalized anxiety disorder    Closed nondisplaced fracture of left tibial plateau 07/16/2022   Multiple rib fractures 07/11/2022   Compression fracture of body of thoracic vertebra ([name]) 07/11/2022  Sepsis ([name]) 07/09/2022   Acute urinary retention 07/09/2022   Prolonged QT interval 07/09/2022   AKI (acute kidney injury) 07/07/2022   Alcohol withdrawal ([name]) 07/05/2022   Acute anemia 07/05/2022   Psoriasis 07/05/2022   Ileus ([name]) 07/04/2022   Acute metabolic encephalopathy 07/04/2022   Traumatic rhabdomyolysis 07/03/2022   Observation after surgery 07/02/2022   Tibial plateau fracture, left 07/02/2022   Alcohol use 07/02/2022   Transaminitis  07/02/2022   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   Adrenal nodule 07/02/2022   Compression fracture of T7 vertebra ([name]) 07/02/2022   Thrombocytopenia 07/02/2022   Anxiety 07/02/2022    PCP: Does not currently have a PCP, but seeing other Rheumatologist regularly  REFERRING PROVIDER: Deward Eck, PA-C  REFERRING DIAG: D27.990J (ICD-10-CM) - Hip fracture ([name])  THERAPY DIAG:  Pain in left leg  Difficulty in walking, not elsewhere classified  Muscle weakness (generalized)  Other low back pain  Rationale for Evaluation and Treatment: Rehabilitation  ONSET DATE: 09/18/2024  SUBJECTIVE:   SUBJECTIVE STATEMENT: I walked more without the cane at home so I am a little sore from that.   PERTINENT HISTORY: Hx of T7 compression Fracture, Hx of left tibial plateau Fx with multiple surgeries in 2023, Psoriatic arthritis, Thrombocytopenia PAIN: 10/22/24 Are you having pain? Yes: NPRS scale: 3-4/10 Pain location: left hip  Pain description: aching on the verge of sharp, irritating Aggravating factors: weight bearing Relieving factors: rest  PRECAUTIONS: None  RED FLAGS: None   WEIGHT BEARING RESTRICTIONS: WBAT with walker or crutches  FALLS:  Has patient fallen in last 6 months? Yes. Number of falls 1  LIVING ENVIRONMENT: Lives with: lives with their family Lives in: House/apartment Stairs: two level, but staying on the bottom floor Has following equipment at home: Vannie - 2 wheeled, Crutches, Wheelchair (manual), shower chair, and Grab bars  OCCUPATION: Works for the [employer], Also works as a architectural technologist  PLOF: Independent and Leisure: hiking, working out  PATIENT GOALS: To return back to normal activities and active lifestyle.  NEXT MD VISIT: November 19, 2024 with Rheumatology with Dr Jeannetta  OBJECTIVE:  Note: Objective measures were completed at Evaluation unless otherwise noted.  DIAGNOSTIC FINDINGS:  Left Femur Radiograph on  09/19/2024: IMPRESSION: ORIF of proximal femur fracture, without immediate postoperative complication.  PATIENT SURVEYS:  LEFS  Extreme difficulty/unable (0), Quite a bit of difficulty (1), Moderate difficulty (2), Little difficulty (3), No difficulty (4) Survey date:  10/01/24  Any of your usual work, housework or school activities 2  2. Usual hobbies, recreational or sporting activities 0  3. Getting into/out of the bath 3  4. Walking between rooms 2  5. Putting on socks/shoes 4  6. Squatting  0  7. Lifting an object, like a bag of groceries from the floor 1  8. Performing light activities around your home 3  9. Performing heavy activities around your home 0  10. Getting into/out of a car 3  11. Walking 2 blocks 0  12. Walking 1 mile 0  13. Going up/down 10 stairs (1 flight) 0  14. Standing for 1 hour 0  15.  sitting for 1 hour 4  16. Running on even ground 0  17. Running on uneven ground 0  18. Making sharp turns while running fast 0  19. Hopping  0  20. Rolling over in bed 2  Score total:  24/80 = 30%     COGNITION: Overall cognitive status: Within functional limits for tasks assessed  SENSATION: Patient denies numbness and tingling  MUSCLE LENGTH: Hamstrings: minimal tightness noted bilaterally  POSTURE: rounded shoulders  PALPATION: Tender to palpation along sight of surgery  LOWER EXTREMITY ROM:  Eval: WFL, but with pain at left hip  LOWER EXTREMITY MMT:  Eval: Right LE strength is WFL Left hip strength is 3/5 Left quad strength is 4-/5 Left hamstring strength is 4-/5   FUNCTIONAL TESTS:  Eval: 5 times sit to stand: 10.53 sec with unilateral hand on the RW Timed up and go (TUG): 13.24 sec with RW  10/24/24: 6 min walk test with cane 1140 ft with 2-3/10 RPE TUG: with cane 7.26 seconds   GAIT: Distance walked: >100 ft Assistive device utilized: Environmental Consultant - 2 wheeled Level of assistance: SBA Comments: Decreased weight bearing through LLE with  antalgic gait noted                                                                                                                                TREATMENT DATE:  10/31/2024: Bike: level 4x 7 minutes -PT monitored for symptoms  Seated hamstring stretch 2x20 seconds bil  Farmer's carry: 10# kettle bell -1 lap around the building each hand Sit to stand 2x10 with Rt=Lt  10# first set, 15# second set, staggered stance  Alternating step taps on 6 step with 15# Farmer's carry 2x10 bil each Sidestepping at barre x 3 laps- yellow loop around ankles  Lunge on to bosu ball forward and lateral with intermittent UE support x10 each   Standing rockerboard x3 min Let press: seat 6 50# x10, 60# x10 with both legs    10/29/2024: Bike: level 4x 7 minutes (2 miles)-PT monitored for symptoms  Gait without cane using mirror as feedback, verbal and demo cues by PT for symmetry and pelvic/trunk rotation Seated hamstring stretch 2x20 seconds bil  Farmer's carry: 5# kettle bell -1 lap around the building each hand Sit to stand 2x10 with Rt=Lt  5# first set, 10# second set, staggered stance  Alternating step taps on 6 step with 5# Farmer's carry 2x10 bil each Sidestepping at barre x 3 laps- blue band around ankles  Hurdles at barre: forward with step-over-over step and sidestepping with step-to x 3 laps each  Standing rockerboard x3 min Standing hip abduction 2x10 and extension 2x10 bil- standing on balance pad   10/24/2024: Bike: level 4x 7 minutes (2 miles)-PT monitored for symptoms  Seated hamstring stretch 2x20 seconds bil  Sit to stand 2x10 with Rt=Lt   Standing weight shift on balance pad 3 ways- 1 min each- heel drive on Rt with Lt step Sidestepping at barre x 3 laps- green band around ankles  Hurdles at barre: forward with step-over-over step and sidestepping with step-to x 3 laps each  Standing rockerboard x3 min Standing hip abduction 2x10 and extension 2x10 bil- standing on balance pad 6  min walk test: pt used cane 1140 ft with 2-3/10 RPE  PATIENT EDUCATION:  Education details: Issued HEP Person educated: Patient Education method: Explanation, Facilities Manager, and Handouts Education comprehension: verbalized understanding  HOME EXERCISE PROGRAM: Access Code: [email or web address] URL: [email or web address] Date: 10/01/2024 Prepared by: Jarrell Menke  Exercises - Sit to Stand with Armchair  - 1 x daily - 7 x weekly - 2 sets - 10 reps - Long Sitting Quad Set  - 1 x daily - 7 x weekly - 2 sets - 10 reps - Supine Active Straight Leg Raise  - 1 x daily - 7 x weekly - 2 sets - 10 reps - Supine Bridge  - 1 x daily - 7 x weekly - 2 sets - 10 reps - Clamshell  - 1 x daily - 7 x weekly - 2 sets - 10 reps - Sidelying Hip Abduction  - 1 x daily - 7 x weekly - 2 sets - 10 reps - Standing Heel Raise  - 1 x daily - 7 x weekly - 2 sets - 20 reps - Standing March with Counter Support  - 1 x daily - 7 x weekly - 1-2 sets - 10 reps  ASSESSMENT:  CLINICAL IMPRESSION: Pt is making steady progress with PT.  He reports soreness after last session and this resolved today. Pt arrived without cane today and is demonstrating improved symmetry.    Pt did well with increased weights and resistance today.  PT monitored throughout and provided verbal cues.  He required verbal cues for farmer's carry with kettlebell for symmetry, trunk rotation and step length. Patient would benefit from skilled PT to progress towards goal related activities.   OBJECTIVE IMPAIRMENTS: Abnormal gait, decreased activity tolerance, decreased balance, difficulty walking, decreased strength, increased muscle spasms, impaired flexibility, and pain.   ACTIVITY LIMITATIONS: carrying, lifting, bending, standing, squatting, sleeping, stairs, transfers, and locomotion level  PARTICIPATION LIMITATIONS: meal prep, cleaning, driving, community activity, and occupation  PERSONAL FACTORS: Fitness, Past/current experiences, and 3+  comorbidities: Hx of left tibial plateau Fx,  Psoriatic arthritis, Thrombocytopenia are also affecting patient's functional outcome.   REHAB POTENTIAL: Good  CLINICAL DECISION MAKING: Evolving/moderate complexity  EVALUATION COMPLEXITY: Moderate   GOALS: Goals reviewed with patient? Yes  SHORT TERM GOALS: Target date: 10/25/2024 Pt will be independent with initial HEP.  Baseline: 10/22/24 Goal status: MET  2.  Patient will participate in a 6 minute walk test to establish a baseline measurement. Baseline: 10/24/24 Goal status: MET  3.  Patient will report at least a 25% improvement in symptoms since starting PT. Baseline: 10/31/24 Goal status: MET   LONG TERM GOALS: Target date: 11/22/2024  Pt will be independent with advanced HEP to allow for self progression after discharge. Baseline:  Goal status: INITIAL  2.  Pt will report no increase in pain with ambulation with LRAD when walking around the grocery store.  Baseline: walking at home without cane, using cane for community distances (10/29/24) Goal status: INITIAL  3.  Patient will improve Lower Extremity Functional Status to at least 55% to demonstrate improved functional mobility. Baseline: 30% Goal status: INITIAL  4.  Patient will report ability to return to hiking without increased pain. Baseline:  Goal status: INITIAL  5.  Patient to increase LE strength to [facility] to allow him to negotiate stairs with reciprocal pattern. Baseline:  Goal status: INITIAL     PLAN:  PT FREQUENCY: 1-2x/week  PT DURATION: 8 weeks  PLANNED INTERVENTIONS: 97164- PT Re-evaluation, 97750- Physical Performance Testing, 97110-Therapeutic exercises, 97530- Therapeutic activity, [id number]- Neuromuscular re-education, 97535- Self  Care, [id number]- Manual therapy, [phone]- Gait training, [phone]- Canalith repositioning, [id number]- Aquatic Therapy, [phone]- Electrical stimulation (unattended), [phone]- Electrical stimulation (manual), [id number]- Vasopneumatic  device, [id number]- Ultrasound, [id number]- Ionotophoresis 4mg /ml Dexamethasone , [id number] (1-2 muscles), [id number] (3+ muscles)- Dry Needling, Patient/Family education, Balance training, Stair training, Taping, Joint mobilization, Joint manipulation, Scar mobilization, Vestibular training, Cryotherapy, and Moist heat  PLAN FOR NEXT SESSION: Assess and progress HEP as indicated, strengthening, flexibility, gait training.   Burnard Joy, PT 10/31/2024 12:33 PM   [facility] [address], [address] [place], [place] [address] Phone # [phone] Fax [phone]

## 2024-11-04 ENCOUNTER — Encounter: Payer: Self-pay | Admitting: Physical Therapy

## 2024-11-04 ENCOUNTER — Ambulatory Visit: Admitting: Physical Therapy

## 2024-11-04 DIAGNOSIS — M79605 Pain in left leg: Secondary | ICD-10-CM | POA: Diagnosis not present

## 2024-11-04 DIAGNOSIS — R262 Difficulty in walking, not elsewhere classified: Secondary | ICD-10-CM

## 2024-11-04 DIAGNOSIS — M5459 Other low back pain: Secondary | ICD-10-CM

## 2024-11-04 DIAGNOSIS — M6281 Muscle weakness (generalized): Secondary | ICD-10-CM

## 2024-11-04 NOTE — Therapy (Signed)
 " OUTPATIENT PHYSICAL THERAPY LOWER EXTREMITY EVALUATION   Patient Name: Ryan Lynch MRN: [id number] DOB:01/28/1981, 43 y.o., male Today's Date: 11/04/2024  END OF SESSION:  PT End of Session - 11/04/24 1449     Visit Number 7    Date for Recertification  11/22/24    Authorization Type BC/BS    PT Start Time 1448    PT Stop Time 1530    PT Time Calculation (min) 42 min    Activity Tolerance Patient tolerated treatment well    Behavior During Therapy [facility] for tasks assessed/performed                Past Medical History:  Diagnosis Date   Acute anemia 06/2022   Acute metabolic encephalopathy 06/2022   Acute urinary retention 06/2022   ADD (attention deficit disorder)    Adrenal nodule 06/2022   AKI (acute kidney injury) 06/2022   Broken arm 09/2023   Broken humerus    Closed nondisplaced fracture of left tibial plateau 07/2022   Multiple rib fractures 06/2022   Psoriasis    Psoriatic arthritis ([name])    Thrombocytopenia 06/2022   Traumatic rhabdomyolysis 06/2022   Past Surgical History:  Procedure Laterality Date   ANTERIOR CRUCIATE LIGAMENT REPAIR Right    ANTERIOR CRUCIATE LIGAMENT REPAIR Right    APPLICATION OF WOUND VAC Left 07/02/2022   Procedure: APPLICATION OF WOUND VAC;  Surgeon: Celena Sharper, MD;  Location: [facility] OR;  Service: Orthopedics;  Laterality: Left;   APPLICATION OF WOUND VAC Left 07/05/2022   Procedure: APPLICATION OF WOUND VAC;  Surgeon: Celena Sharper, MD;  Location: [facility] OR;  Service: Orthopedics;  Laterality: Left;   CLOSED REDUCTION TIBIA Left 07/02/2022   Procedure: CLOSED REDUCTION TIBIA;  Surgeon: Celena Sharper, MD;  Location: [facility] OR;  Service: Orthopedics;  Laterality: Left;   EXTERNAL FIXATION LEG Left 07/02/2022   Procedure: EXTERNAL FIXATION LEG;  Surgeon: Celena Sharper, MD;  Location: [facility] OR;  Service: Orthopedics;  Laterality: Left;   FASCIOTOMY Left 07/02/2022   Procedure: FASCIOTOMY;  Surgeon: Celena Sharper, MD;  Location: [facility] OR;   Service: Orthopedics;  Laterality: Left;   I & D EXTREMITY Left 07/07/2022   Procedure: IRRIGATION AND DEBRIDEMENT EXTREMITY;  Surgeon: Celena Sharper, MD;  Location: [facility] OR;  Service: Orthopedics;  Laterality: Left;   INTRAMEDULLARY (IM) NAIL INTERTROCHANTERIC Left 09/19/2024   Procedure: FIXATION, FRACTURE, INTERTROCHANTERIC, WITH INTRAMEDULLARY ROD;  Surgeon: Celena Sharper, MD;  Location: [facility] OR;  Service: Orthopedics;  Laterality: Left;   ORIF TIBIA PLATEAU Left 07/05/2022   Procedure: OPEN REDUCTION INTERNAL FIXATION (ORIF) TIBIAL PLATEAU;  Surgeon: Celena Sharper, MD;  Location: [facility] OR;  Service: Orthopedics;  Laterality: Left;   SECONDARY CLOSURE OF WOUND Left 07/05/2022   Procedure: SECONDARY CLOSURE OF WOUND;  Surgeon: Celena Sharper, MD;  Location: [facility] OR;  Service: Orthopedics;  Laterality: Left;   SECONDARY CLOSURE OF WOUND Left 07/07/2022   Procedure: SECONDARY CLOSURE OF WOUND;  Surgeon: Celena Sharper, MD;  Location: [facility] OR;  Service: Orthopedics;  Laterality: Left;   SHOULDER SURGERY Bilateral    WISDOM TOOTH EXTRACTION  05/23/2023   Patient Active Problem List   Diagnosis Date Noted   Femur fracture ([name]) 09/18/2024   Psoriatic arthritis ([name]) 03/20/2023   High risk medication use 03/20/2023   Vitamin D  deficiency 03/20/2023   Generalized anxiety disorder    Closed nondisplaced fracture of left tibial plateau 07/16/2022   Multiple rib fractures 07/11/2022   Compression fracture of body of thoracic vertebra ([name])  07/11/2022   Sepsis ([name]) 07/09/2022   Acute urinary retention 07/09/2022   Prolonged QT interval 07/09/2022   AKI (acute kidney injury) 07/07/2022   Alcohol withdrawal ([name]) 07/05/2022   Acute anemia 07/05/2022   Psoriasis 07/05/2022   Ileus ([name]) 07/04/2022   Acute metabolic encephalopathy 07/04/2022   Traumatic rhabdomyolysis 07/03/2022   Observation after surgery 07/02/2022   Tibial plateau fracture, left 07/02/2022   Alcohol use 07/02/2022   Transaminitis  07/02/2022   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   Adrenal nodule 07/02/2022   Compression fracture of T7 vertebra ([name]) 07/02/2022   Thrombocytopenia 07/02/2022   Anxiety 07/02/2022    PCP: Does not currently have a PCP, but seeing other Rheumatologist regularly  REFERRING PROVIDER: Deward Eck, PA-C  REFERRING DIAG: D27.990J (ICD-10-CM) - Hip fracture ([name])  THERAPY DIAG:  Pain in left leg  Difficulty in walking, not elsewhere classified  Muscle weakness (generalized)  Other low back pain  Rationale for Evaluation and Treatment: Rehabilitation  ONSET DATE: 09/18/2024  SUBJECTIVE:   SUBJECTIVE STATEMENT: I get a little pain in the morning, but then it's better.   PERTINENT HISTORY: Hx of T7 compression Fracture, Hx of left tibial plateau Fx with multiple surgeries in 2023, Psoriatic arthritis, Thrombocytopenia PAIN: 10/22/24 Are you having pain? Yes: NPRS scale: 3-4/10 Pain location: left hip  Pain description: aching on the verge of sharp, irritating Aggravating factors: weight bearing Relieving factors: rest  PRECAUTIONS: None  RED FLAGS: None   WEIGHT BEARING RESTRICTIONS: WBAT with walker or crutches  FALLS:  Has patient fallen in last 6 months? Yes. Number of falls 1  LIVING ENVIRONMENT: Lives with: lives with their family Lives in: House/apartment Stairs: two level, but staying on the bottom floor Has following equipment at home: Vannie - 2 wheeled, Crutches, Wheelchair (manual), shower chair, and Grab bars  OCCUPATION: Works for the [employer], Also works as a architectural technologist  PLOF: Independent and Leisure: hiking, working out  PATIENT GOALS: To return back to normal activities and active lifestyle.  NEXT MD VISIT: November 19, 2024 with Rheumatology with Dr Jeannetta  OBJECTIVE:  Note: Objective measures were completed at Evaluation unless otherwise noted.  DIAGNOSTIC FINDINGS:  Left Femur Radiograph on  09/19/2024: IMPRESSION: ORIF of proximal femur fracture, without immediate postoperative complication.  PATIENT SURVEYS:  LEFS  Extreme difficulty/unable (0), Quite a bit of difficulty (1), Moderate difficulty (2), Little difficulty (3), No difficulty (4) Survey date:  10/01/24  Any of your usual work, housework or school activities 2  2. Usual hobbies, recreational or sporting activities 0  3. Getting into/out of the bath 3  4. Walking between rooms 2  5. Putting on socks/shoes 4  6. Squatting  0  7. Lifting an object, like a bag of groceries from the floor 1  8. Performing light activities around your home 3  9. Performing heavy activities around your home 0  10. Getting into/out of a car 3  11. Walking 2 blocks 0  12. Walking 1 mile 0  13. Going up/down 10 stairs (1 flight) 0  14. Standing for 1 hour 0  15.  sitting for 1 hour 4  16. Running on even ground 0  17. Running on uneven ground 0  18. Making sharp turns while running fast 0  19. Hopping  0  20. Rolling over in bed 2  Score total:  24/80 = 30%     COGNITION: Overall cognitive status: Within functional limits for tasks assessed  SENSATION: Patient denies numbness and tingling  MUSCLE LENGTH: Hamstrings: minimal tightness noted bilaterally  POSTURE: rounded shoulders  PALPATION: Tender to palpation along sight of surgery  LOWER EXTREMITY ROM:  Eval: WFL, but with pain at left hip  LOWER EXTREMITY MMT:  Eval: Right LE strength is WFL Left hip strength is 3/5 Left quad strength is 4-/5 Left hamstring strength is 4-/5   FUNCTIONAL TESTS:  Eval: 5 times sit to stand: 10.53 sec with unilateral hand on the RW Timed up and go (TUG): 13.24 sec with RW  10/24/24: 6 min walk test with cane 1140 ft with 2-3/10 RPE TUG: with cane 7.26 seconds   GAIT: Distance walked: >100 ft Assistive device utilized: Environmental Consultant - 2 wheeled Level of assistance: SBA Comments: Decreased weight bearing through LLE with  antalgic gait noted                                                                                                                                TREATMENT DATE:   11/04/2024: Bike: level 4x 7 minutes -PT monitored for symptoms  Seated hamstring stretch 2x20 seconds bil  Farmer's carry: 10# kettle bell -1 lap around the building each hand Sit to stand 2x10 with Rt=Lt  10# first set, 15# second set, staggered stance  Alternating step taps on 6 step with 15# Farmer's carry 2x10 bil each Sidestepping at barre x 4 laps- red loop around ankles (after 4 laps with yellow) Lunge on to bosu ball forward and lateral with intermittent UE support x10 each   Standing rockerboard x3 min Leg press: seat 6  60# 1x10 with both legs  then 65# 1x10 SLS L with toe taps R using 3 dots x 10   10/31/2024: Bike: level 4x 7 minutes -PT monitored for symptoms  Seated hamstring stretch 2x20 seconds bil  Farmer's carry: 10# kettle bell -1 lap around the building each hand Sit to stand 2x10 with Rt=Lt  10# first set, 15# second set, staggered stance  Alternating step taps on 6 step with 15# Farmer's carry 2x10 bil each Sidestepping at barre x 3 laps- yellow loop around ankles  Lunge on to bosu ball forward and lateral with intermittent UE support x10 each   Standing rockerboard x3 min Let press: seat 6 50# x10, 60# x10 with both legs    10/29/2024: Bike: level 4x 7 minutes (2 miles)-PT monitored for symptoms  Gait without cane using mirror as feedback, verbal and demo cues by PT for symmetry and pelvic/trunk rotation Seated hamstring stretch 2x20 seconds bil  Farmer's carry: 5# kettle bell -1 lap around the building each hand Sit to stand 2x10 with Rt=Lt  5# first set, 10# second set, staggered stance  Alternating step taps on 6 step with 5# Farmer's carry 2x10 bil each Sidestepping at barre x 3 laps- blue band around ankles  Hurdles at barre: forward with step-over-over step and sidestepping with  step-to x 3  laps each  Standing rockerboard x3 min Standing hip abduction 2x10 and extension 2x10 bil- standing on balance pad   10/24/2024: Bike: level 4x 7 minutes (2 miles)-PT monitored for symptoms  Seated hamstring stretch 2x20 seconds bil  Sit to stand 2x10 with Rt=Lt   Standing weight shift on balance pad 3 ways- 1 min each- heel drive on Rt with Lt step Sidestepping at barre x 3 laps- green band around ankles  Hurdles at barre: forward with step-over-over step and sidestepping with step-to x 3 laps each  Standing rockerboard x3 min Standing hip abduction 2x10 and extension 2x10 bil- standing on balance pad 6 min walk test: pt used cane 1140 ft with 2-3/10 RPE  PATIENT EDUCATION:  Education details: Issued HEP Person educated: Patient Education method: Explanation, Facilities Manager, and Handouts Education comprehension: verbalized understanding  HOME EXERCISE PROGRAM: Access Code: [email or web address] URL: [email or web address] Date: 10/01/2024 Prepared by: Jarrell Menke  Exercises - Sit to Stand with Armchair  - 1 x daily - 7 x weekly - 2 sets - 10 reps - Long Sitting Quad Set  - 1 x daily - 7 x weekly - 2 sets - 10 reps - Supine Active Straight Leg Raise  - 1 x daily - 7 x weekly - 2 sets - 10 reps - Supine Bridge  - 1 x daily - 7 x weekly - 2 sets - 10 reps - Clamshell  - 1 x daily - 7 x weekly - 2 sets - 10 reps - Sidelying Hip Abduction  - 1 x daily - 7 x weekly - 2 sets - 10 reps - Standing Heel Raise  - 1 x daily - 7 x weekly - 2 sets - 20 reps - Standing March with Counter Support  - 1 x daily - 7 x weekly - 1-2 sets - 10 reps  ASSESSMENT:  CLINICAL IMPRESSION: Ryan Lynch did very well with therapy today. His gait still demonstrates some abnormalities. He has decreased knee flexion with swing phase primarily. He states his biggest problem is putting pressure through the left leg for stair climbing and single leg activities. He did well with SL stance clock taps  today. His balance improved with reps. He continues to demonstrate potential for improvement and would benefit from continued skilled therapy to address remaining impairments.    OBJECTIVE IMPAIRMENTS: Abnormal gait, decreased activity tolerance, decreased balance, difficulty walking, decreased strength, increased muscle spasms, impaired flexibility, and pain.   ACTIVITY LIMITATIONS: carrying, lifting, bending, standing, squatting, sleeping, stairs, transfers, and locomotion level  PARTICIPATION LIMITATIONS: meal prep, cleaning, driving, community activity, and occupation  PERSONAL FACTORS: Fitness, Past/current experiences, and 3+ comorbidities: Hx of left tibial plateau Fx,  Psoriatic arthritis, Thrombocytopenia are also affecting patient's functional outcome.   REHAB POTENTIAL: Good  CLINICAL DECISION MAKING: Evolving/moderate complexity  EVALUATION COMPLEXITY: Moderate   GOALS: Goals reviewed with patient? Yes  SHORT TERM GOALS: Target date: 10/25/2024 Pt will be independent with initial HEP.  Baseline: 10/22/24 Goal status: MET  2.  Patient will participate in a 6 minute walk test to establish a baseline measurement. Baseline: 10/24/24 Goal status: MET  3.  Patient will report at least a 25% improvement in symptoms since starting PT. Baseline: 10/31/24 Goal status: MET   LONG TERM GOALS: Target date: 11/22/2024  Pt will be independent with advanced HEP to allow for self progression after discharge. Baseline:  Goal status: INITIAL  2.  Pt will report no increase in pain with ambulation with LRAD when walking around the  grocery store.  Baseline: walking at home without cane, using cane for community distances (10/29/24) Goal status: INITIAL  3.  Patient will improve Lower Extremity Functional Status to at least 55% to demonstrate improved functional mobility. Baseline: 30% Goal status: INITIAL  4.  Patient will report ability to return to hiking without increased  pain. Baseline:  Goal status: INITIAL  5.  Patient to increase LE strength to [facility] to allow him to negotiate stairs with reciprocal pattern. Baseline:  Goal status: INITIAL     PLAN:  PT FREQUENCY: 1-2x/week  PT DURATION: 8 weeks  PLANNED INTERVENTIONS: [id number]- PT Re-evaluation, 97750- Physical Performance Testing, 97110-Therapeutic exercises, 97530- Therapeutic activity, [id number]- Neuromuscular re-education, 97535- Self Care, [id number]- Manual therapy, [id number]- Gait training, [phone]- Canalith repositioning, [id number]- Aquatic Therapy, [phone]- Electrical stimulation (unattended), [phone]- Electrical stimulation (manual), [id number]- Vasopneumatic device, [id number]- Ultrasound, [id number]- Ionotophoresis 4mg /ml Dexamethasone , [id number] (1-2 muscles), [id number] (3+ muscles)- Dry Needling, Patient/Family education, Balance training, Stair training, Taping, Joint mobilization, Joint manipulation, Scar mobilization, Vestibular training, Cryotherapy, and Moist heat  PLAN FOR NEXT SESSION: Assess and progress HEP as indicated, strengthening, flexibility, gait training.   Mliss Cummins, PT  11/04/2024 3:33 PM   [facility] [address] [place], [place] [address] Phone # [phone] Fax [phone]  "

## 2024-11-11 NOTE — Progress Notes (Signed)
 "  Office Visit Note  Patient: Ryan Lynch             Date of Birth: 1981-06-01           MRN: [id number]             PCP: Patient, No Pcp Per Referring: No ref. provider found Visit Date: 11/19/2024   Subjective:  Medication Management (Cosentyx  restart )   History of Present Illness: Ryan Lynch is a 43 y.o. male here for follow up for psoriatic arthritis and plaque psoriasis now on Cosentyx  300 mg subcu monthly.    Discussed the use of AI scribe software for clinical note transcription with the patient, who gave verbal consent to proceed.  History of Present Illness   Ryan Lynch is a 43 year old male here for follow up for psoriatic arthritis and psoriasis currently just treating with topical steroids PRN. He is off Cosentyx  for about 3 months on account of femur fracture and surgery.  He has experienced multiple bone fractures over the past four years but no prior history The first incident involved a fall down a flight of stairs, resulting in significant injury. The most recent fall occurred on a slippery surface, leading to a hip fracture.  He is currently undergoing physical therapy following his recent fall and continues to work on his recovery. He is not experiencing significant pain and is able to walk well.  The patient reports not experiencing significant pain and feels that his muscles are still strong. His psoriasis is mostly clear with some involvement on the scalp, right knee, and left shin. He anticipates follow up with Dr. Celena later this month who also discussed getting bone density testing and possibly seeing a bone specialist in [place].       Previous HPI 05/14/2024 Ryan Lynch is a 43 y.o. male here for follow up for psoriatic arthritis and plaque psoriasis now on Cosentyx  300 mg subcu monthly.    His fingernails have remained improved and no recurrence of skin rashes.   He has not encountered any issues with his injections. He sometimes worries about  not administering them correctly but has not actually missed any doses.   He has not been sick with any respiratory viruses or required antibiotics.   He is using a knee brace for protection with the left leg fracture but not having any significant pain or swelling associated.     Previous HPI 02/13/2024 Ryan Lynch is a 43 y.o. male here for follow up for psoriatic arthritis and plaque psoriasis now on Cosentyx  300 mg subcu monthly.  He has been off treatment since in January due to concern this was delaying arm healing after humeral fracture from November. Subsequently progress has gone well, still with some limitations in in mobility but not much pain. He was also sick with influenza A in February without major complications. He has not experienced any severe flare of symptoms with pausing medication 2 months.   09/18/2023 Ryan Lynch is a 43 y.o. male here for follow up for psoriatic arthritis and plaque psoriasis now on Cosentyx  300 mg subcu monthly.  He feels the Cosentyx  is working well with complete clearance of his skin rashes joint pain or stiffness doing mostly better.  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  Currently in a sling anticipating he has an appointment with orthopedic surgery scheduled in 2 days.  06/06/2023 Ryan Lynch is a 43 y.o. male here for follow up for psoriatic arthritis and plaque psoriasis now on Cosentyx  300 mg subcu monthly.  So far he has seen a large improvement in symptoms.  Skin rashes improved no longer with redness and almost all scaling is gone still has residual hyperpigmented patches in the previously inflamed areas.  Joint pain and stiffness is also improved.  Still using a left knee brace just as a precaution but not having too much pain except with very increased use.  He had a wisdom tooth extraction after this cracked and was temporarily on antibiotics.    Previous HPI 03/20/23 Ryan Lynch is a 43 y.o. 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  He has a longstanding history of psoriasis starting in his 35s.  Started out exclusively as skin plaques subsequently developed nail discoloration and pitting and then with joint pain in multiple areas.  He has joint pain with occasional swelling in the fingers and toes on both sides sometimes hip and shoulder pain and not sure if these are swollen.  He was never on any systemic treatment for this.  He suffered a fall down a flight of steps last year with multiple injuries including T7 anterior vertebral body compression fracture, comminuted left tibial plateau fracture and scalp hematoma.  Subsequent had surgery for repair of the left knee fracture.  After all his events he experienced a severe worsening of his psoriasis throughout his body and increasing joint pain.  At first the left knee pain was most severe but later had the inflammatory joint pain in multiple areas including the right knee has actually become equal to the original injured site.  His increase symptoms are ongoing continuously since last September which has never happened before for this duration or intensity.  He was taking oxycodone  initially around type injury currently just using ibuprofen, Tylenol , and gabapentin . He has a previous history of alcohol abuse associated with anxiety disorder.  Had negative screening for hepatitis last year.  No history of frequent recurrent infections.   HCV neg 2023     Review of Systems  HENT:  Positive for mouth dryness. Negative for mouth sores.   Eyes:  Negative for dryness.  Respiratory:  Negative for shortness of breath.   Cardiovascular:  Negative for chest pain and palpitations.  Gastrointestinal:  Negative for blood in stool, constipation and diarrhea.  Endocrine: Negative for increased urination.   Genitourinary:  Negative for involuntary urination.  Musculoskeletal:  Positive for myalgias, morning stiffness and myalgias. Negative for joint pain, gait problem, joint pain, joint swelling, muscle weakness and muscle tenderness.  Skin:  Negative for color change, rash, hair loss and sensitivity to sunlight.  Allergic/Immunologic: Negative for susceptible to infections.  Neurological:  Positive for headaches. Negative for dizziness.  Hematological:  Negative for swollen glands.  Psychiatric/Behavioral:  Negative for depressed mood and sleep disturbance. The patient is not nervous/anxious.     PMFS History:  Patient Active Problem List   Diagnosis Date Noted   Femur fracture ([name]) 09/18/2024   Psoriatic arthritis ([name]) 03/20/2023   High risk medication use 03/20/2023   Vitamin D  deficiency 03/20/2023   Generalized anxiety disorder    Closed nondisplaced fracture of left tibial plateau 07/16/2022   Multiple rib fractures 07/11/2022   Compression fracture of body of thoracic vertebra ([name]) 07/11/2022   Sepsis ([name]) 07/09/2022   Acute urinary retention 07/09/2022  Prolonged QT interval 07/09/2022   AKI (acute kidney injury) 07/07/2022   Alcohol withdrawal ([name]) 07/05/2022   Acute anemia 07/05/2022   Psoriasis 07/05/2022   Ileus ([name]) 07/04/2022   Acute metabolic encephalopathy 07/04/2022   Traumatic rhabdomyolysis 07/03/2022   Observation after surgery 07/02/2022   Tibial plateau fracture, left 07/02/2022   Alcohol use 07/02/2022   Transaminitis 07/02/2022   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   Adrenal nodule 07/02/2022   Compression fracture of T7 vertebra ([name]) 07/02/2022   Thrombocytopenia 07/02/2022   Anxiety 07/02/2022    Past Medical History:  Diagnosis Date   Acute anemia 06/2022   Acute metabolic encephalopathy 06/2022   Acute urinary retention 06/2022   ADD (attention deficit disorder)    Adrenal nodule 06/2022   AKI (acute kidney injury) 06/2022    Broken arm 09/2023   Broken humerus    Closed nondisplaced fracture of left tibial plateau 07/2022   Multiple rib fractures 06/2022   Psoriasis    Psoriatic arthritis ([name])    Thrombocytopenia 06/2022   Traumatic rhabdomyolysis 06/2022    Family History  Problem Relation Age of Onset   Osteoporosis Mother    Arthritis Father    Past Surgical History:  Procedure Laterality Date   ANTERIOR CRUCIATE LIGAMENT REPAIR Right    ANTERIOR CRUCIATE LIGAMENT REPAIR Right    APPLICATION OF WOUND VAC Left 07/02/2022   Procedure: APPLICATION OF WOUND VAC;  Surgeon: Celena Sharper, MD;  Location: [facility] OR;  Service: Orthopedics;  Laterality: Left;   APPLICATION OF WOUND VAC Left 07/05/2022   Procedure: APPLICATION OF WOUND VAC;  Surgeon: Celena Sharper, MD;  Location: [facility] OR;  Service: Orthopedics;  Laterality: Left;   CLOSED REDUCTION TIBIA Left 07/02/2022   Procedure: CLOSED REDUCTION TIBIA;  Surgeon: Celena Sharper, MD;  Location: [facility] OR;  Service: Orthopedics;  Laterality: Left;   EXTERNAL FIXATION LEG Left 07/02/2022   Procedure: EXTERNAL FIXATION LEG;  Surgeon: Celena Sharper, MD;  Location: [facility] OR;  Service: Orthopedics;  Laterality: Left;   FASCIOTOMY Left 07/02/2022   Procedure: FASCIOTOMY;  Surgeon: Celena Sharper, MD;  Location: [facility] OR;  Service: Orthopedics;  Laterality: Left;   I & D EXTREMITY Left 07/07/2022   Procedure: IRRIGATION AND DEBRIDEMENT EXTREMITY;  Surgeon: Celena Sharper, MD;  Location: [facility] OR;  Service: Orthopedics;  Laterality: Left;   INTRAMEDULLARY (IM) NAIL INTERTROCHANTERIC Left 09/19/2024   Procedure: FIXATION, FRACTURE, INTERTROCHANTERIC, WITH INTRAMEDULLARY ROD;  Surgeon: Celena Sharper, MD;  Location: [facility] OR;  Service: Orthopedics;  Laterality: Left;   ORIF TIBIA PLATEAU Left 07/05/2022   Procedure: OPEN REDUCTION INTERNAL FIXATION (ORIF) TIBIAL PLATEAU;  Surgeon: Celena Sharper, MD;  Location: [facility] OR;  Service: Orthopedics;  Laterality: Left;   SECONDARY CLOSURE OF WOUND  Left 07/05/2022   Procedure: SECONDARY CLOSURE OF WOUND;  Surgeon: Celena Sharper, MD;  Location: [facility] OR;  Service: Orthopedics;  Laterality: Left;   SECONDARY CLOSURE OF WOUND Left 07/07/2022   Procedure: SECONDARY CLOSURE OF WOUND;  Surgeon: Celena Sharper, MD;  Location: [facility] OR;  Service: Orthopedics;  Laterality: Left;   SHOULDER SURGERY Bilateral    WISDOM TOOTH EXTRACTION  05/23/2023   Social History   Social History Narrative   Not on file    There is no immunization history on file for this patient.   Objective: Vital Signs: BP 103/72   Pulse 71   Temp (!) 96.7 F (35.9 C)   Resp 16   Ht 5' 10 (1.778 m)   Wt 170 lb 9.6  oz (77.4 kg)   BMI 24.48 kg/m    Physical Exam Eyes:     Conjunctiva/sclera: Conjunctivae normal.  Cardiovascular:     Rate and Rhythm: Normal rate and regular rhythm.  Pulmonary:     Effort: Pulmonary effort is normal.     Breath sounds: Normal breath sounds.  Lymphadenopathy:     Cervical: No cervical adenopathy.  Skin:    General: Skin is warm and dry.     Findings: Rash present.     Comments: Erythematous plaques on scalp Small patch with overlying scale on right knee and larger patch on left shin  Neurological:     Mental Status: He is alert.  Psychiatric:        Mood and Affect: Mood normal.      Musculoskeletal Exam:  Shoulders full ROM no tenderness or swelling Elbows full ROM no tenderness or swelling Wrists full ROM no tenderness or swelling Fingers full ROM no tenderness or swelling Knees full ROM no tenderness or swelling Ankles full ROM no tenderness or swelling   Investigation: No additional findings.  Imaging: No results found.  Recent Labs: Lab Results  Component Value Date   WBC 3.5 (L) 09/22/2024   HGB 8.8 (L) 09/22/2024   PLT 111 (L) 09/22/2024   NA 138 09/22/2024   K 3.3 (L) 09/22/2024   CL 100 09/22/2024   CO2 25 09/22/2024   GLUCOSE 93 09/22/2024   BUN 8 09/22/2024   CREATININE 0.87 09/22/2024    BILITOT 1.0 09/22/2024   ALKPHOS 62 09/22/2024   AST 20 09/22/2024   ALT 16 09/22/2024   PROT 5.4 (L) 09/22/2024   ALBUMIN  2.6 (L) 09/22/2024   CALCIUM 8.3 (L) 09/22/2024   QFTBGOLDPLUS NEGATIVE 05/14/2024    Speciality Comments: No specialty comments available.  Procedures:  No procedures performed Allergies: Timothy grass pollen allergen   Assessment / Plan:     Visit Diagnoses: Psoriatic arthritis ([name]) Psoriasis Well-controlled with minimal scalp and knee involvement. Off Cosentyx  for three months, remains active but low disease activity with topical steroids for now. Is trending worse gradually so would expect a need to resume his maintenance within next month or two. - Restart Cosentyx  in February or March, pending orthopedic follow-up. - Continue clobetasol solution as needed for scalp involvement.  High risk medication use - Cosentyx  300 mg subcu monthly Reviewed most recent labs from last year were significantly abnormal but were checked in recent postoperative period so not very accurate. Anticipate he is having labs checked again later this month so will wait on these. Is up to date on quantiferon screening.  Closed fracture of right femur, unspecified fracture morphology, unspecified portion of femur, initial encounter ([name]) Compression fracture of body of thoracic vertebra ([name]) Fragility defining fracture history now. Agree with planned DEXA evaluation could consider PTH anabolic agents if low. Has history of EtOH abuse as potential contributor. Also has inflammatory arthritis but no erosive disease and no long term steroid requirement.        Orders: No orders of the defined types were placed in this encounter.  No orders of the defined types were placed in this encounter.    Follow-Up Instructions: Return in about 5 months (around 04/19/2025) for PsA COS resume f/u 5mos.   Lonni LELON Ester, MD  Note - This record has been created using [employer].   Chart creation errors have been sought, but may not always  have been located. Such creation errors do not reflect on  the standard of medical care. "

## 2024-11-12 ENCOUNTER — Ambulatory Visit

## 2024-11-18 ENCOUNTER — Ambulatory Visit

## 2024-11-19 ENCOUNTER — Ambulatory Visit: Attending: Internal Medicine | Admitting: Internal Medicine

## 2024-11-19 ENCOUNTER — Encounter: Payer: Self-pay | Admitting: Internal Medicine

## 2024-11-19 VITALS — BP 103/72 | HR 71 | Temp 96.7°F | Resp 16 | Ht 70.0 in | Wt 170.6 lb

## 2024-11-19 DIAGNOSIS — L4050 Arthropathic psoriasis, unspecified: Secondary | ICD-10-CM

## 2024-11-19 DIAGNOSIS — S22000A Wedge compression fracture of unspecified thoracic vertebra, initial encounter for closed fracture: Secondary | ICD-10-CM

## 2024-11-19 DIAGNOSIS — Z79899 Other long term (current) drug therapy: Secondary | ICD-10-CM | POA: Diagnosis not present

## 2024-11-19 DIAGNOSIS — S7291XA Unspecified fracture of right femur, initial encounter for closed fracture: Secondary | ICD-10-CM | POA: Diagnosis not present

## 2024-11-19 DIAGNOSIS — L409 Psoriasis, unspecified: Secondary | ICD-10-CM

## 2024-11-20 ENCOUNTER — Other Ambulatory Visit (HOSPITAL_COMMUNITY): Payer: Self-pay

## 2024-11-21 ENCOUNTER — Ambulatory Visit: Attending: Orthopedic Surgery

## 2024-11-21 DIAGNOSIS — R262 Difficulty in walking, not elsewhere classified: Secondary | ICD-10-CM | POA: Diagnosis present

## 2024-11-21 DIAGNOSIS — M5459 Other low back pain: Secondary | ICD-10-CM | POA: Insufficient documentation

## 2024-11-21 DIAGNOSIS — M79605 Pain in left leg: Secondary | ICD-10-CM | POA: Diagnosis present

## 2024-11-21 DIAGNOSIS — M6281 Muscle weakness (generalized): Secondary | ICD-10-CM | POA: Insufficient documentation

## 2024-11-21 NOTE — Therapy (Signed)
 " OUTPATIENT PHYSICAL THERAPY TREATMENT   Patient Name: Ryan Lynch MRN: [id number] DOB:07/18/1981, 44 y.o., male Today's Date: 11/21/2024  END OF SESSION:  PT End of Session - 11/21/24 1227     Visit Number 8    Date for Recertification  01/16/25    Authorization Type BC/BS- approved 14 visits 11/18-2/16    Authorization - Visit Number 8    Authorization - Number of Visits 14    PT Start Time 1149    PT Stop Time 1230    PT Time Calculation (min) 41 min    Activity Tolerance Patient tolerated treatment well    Behavior During Therapy [facility] for tasks assessed/performed                Past Medical History:  Diagnosis Date   Acute anemia 06/2022   Acute metabolic encephalopathy 06/2022   Acute urinary retention 06/2022   ADD (attention deficit disorder)    Adrenal nodule 06/2022   AKI (acute kidney injury) 06/2022   Broken arm 09/2023   Broken humerus    Closed nondisplaced fracture of left tibial plateau 07/2022   Multiple rib fractures 06/2022   Psoriasis    Psoriatic arthritis ([name])    Thrombocytopenia 06/2022   Traumatic rhabdomyolysis 06/2022   Past Surgical History:  Procedure Laterality Date   ANTERIOR CRUCIATE LIGAMENT REPAIR Right    ANTERIOR CRUCIATE LIGAMENT REPAIR Right    APPLICATION OF WOUND VAC Left 07/02/2022   Procedure: APPLICATION OF WOUND VAC;  Surgeon: Celena Sharper, MD;  Location: [facility] OR;  Service: Orthopedics;  Laterality: Left;   APPLICATION OF WOUND VAC Left 07/05/2022   Procedure: APPLICATION OF WOUND VAC;  Surgeon: Celena Sharper, MD;  Location: [facility] OR;  Service: Orthopedics;  Laterality: Left;   CLOSED REDUCTION TIBIA Left 07/02/2022   Procedure: CLOSED REDUCTION TIBIA;  Surgeon: Celena Sharper, MD;  Location: [facility] OR;  Service: Orthopedics;  Laterality: Left;   EXTERNAL FIXATION LEG Left 07/02/2022   Procedure: EXTERNAL FIXATION LEG;  Surgeon: Celena Sharper, MD;  Location: [facility] OR;  Service: Orthopedics;  Laterality: Left;   FASCIOTOMY  Left 07/02/2022   Procedure: FASCIOTOMY;  Surgeon: Celena Sharper, MD;  Location: [facility] OR;  Service: Orthopedics;  Laterality: Left;   I & D EXTREMITY Left 07/07/2022   Procedure: IRRIGATION AND DEBRIDEMENT EXTREMITY;  Surgeon: Celena Sharper, MD;  Location: [facility] OR;  Service: Orthopedics;  Laterality: Left;   INTRAMEDULLARY (IM) NAIL INTERTROCHANTERIC Left 09/19/2024   Procedure: FIXATION, FRACTURE, INTERTROCHANTERIC, WITH INTRAMEDULLARY ROD;  Surgeon: Celena Sharper, MD;  Location: [facility] OR;  Service: Orthopedics;  Laterality: Left;   ORIF TIBIA PLATEAU Left 07/05/2022   Procedure: OPEN REDUCTION INTERNAL FIXATION (ORIF) TIBIAL PLATEAU;  Surgeon: Celena Sharper, MD;  Location: [facility] OR;  Service: Orthopedics;  Laterality: Left;   SECONDARY CLOSURE OF WOUND Left 07/05/2022   Procedure: SECONDARY CLOSURE OF WOUND;  Surgeon: Celena Sharper, MD;  Location: [facility] OR;  Service: Orthopedics;  Laterality: Left;   SECONDARY CLOSURE OF WOUND Left 07/07/2022   Procedure: SECONDARY CLOSURE OF WOUND;  Surgeon: Celena Sharper, MD;  Location: [facility] OR;  Service: Orthopedics;  Laterality: Left;   SHOULDER SURGERY Bilateral    WISDOM TOOTH EXTRACTION  05/23/2023   Patient Active Problem List   Diagnosis Date Noted   Femur fracture ([name]) 09/18/2024   Psoriatic arthritis ([name]) 03/20/2023   High risk medication use 03/20/2023   Vitamin D  deficiency 03/20/2023   Generalized anxiety disorder    Closed nondisplaced fracture of left  tibial plateau 07/16/2022   Multiple rib fractures 07/11/2022   Compression fracture of body of thoracic vertebra ([name]) 07/11/2022   Sepsis ([name]) 07/09/2022   Acute urinary retention 07/09/2022   Prolonged QT interval 07/09/2022   AKI (acute kidney injury) 07/07/2022   Alcohol withdrawal ([name]) 07/05/2022   Acute anemia 07/05/2022   Psoriasis 07/05/2022   Ileus ([name]) 07/04/2022   Acute metabolic encephalopathy 07/04/2022   Traumatic rhabdomyolysis 07/03/2022   Observation after surgery  07/02/2022   Tibial plateau fracture, left 07/02/2022   Alcohol use 07/02/2022   Transaminitis 07/02/2022   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   Adrenal nodule 07/02/2022   Compression fracture of T7 vertebra ([name]) 07/02/2022   Thrombocytopenia 07/02/2022   Anxiety 07/02/2022    PCP: Does not currently have a PCP, but seeing other Rheumatologist regularly  REFERRING PROVIDER: Deward Eck, PA-C  REFERRING DIAG: D27.990J (ICD-10-CM) - Hip fracture ([name])  THERAPY DIAG:  Pain in left leg - Plan: PT plan of care cert/re-cert  Difficulty in walking, not elsewhere classified - Plan: PT plan of care cert/re-cert  Muscle weakness (generalized) - Plan: PT plan of care cert/re-cert  Rationale for Evaluation and Treatment: Rehabilitation  ONSET DATE: 11/27/24  SUBJECTIVE:   SUBJECTIVE STATEMENT: Lapse in treatment since 11/04/24. I've been taking less pain medication.  Walking mostly without my cane.  PERTINENT HISTORY: Hx of T7 compression Fracture, Hx of left tibial plateau Fx with multiple surgeries in 2023, Psoriatic arthritis, Thrombocytopenia PAIN: 11/21/24 Are you having pain? Yes: NPRS scale: 3/10 Pain location: left hip  Pain description: aching on the verge of sharp, irritating Aggravating factors: weight bearing Relieving factors: rest  PRECAUTIONS: None  RED FLAGS: None   WEIGHT BEARING RESTRICTIONS: WBAT with walker or crutches  FALLS:  Has patient fallen in last 6 months? Yes. Number of falls 1  LIVING ENVIRONMENT: Lives with: lives with their family Lives in: House/apartment Stairs: two level, but staying on the bottom floor Has following equipment at home: Vannie - 2 wheeled, Crutches, Wheelchair (manual), shower chair, and Grab bars  OCCUPATION: Works for the [employer], Also works as a architectural technologist  PLOF: Independent and Leisure: hiking, working out  PATIENT GOALS: To return back to normal activities and active lifestyle.  NEXT  MD VISIT: November 19, 2024 with Rheumatology with Dr Jeannetta  OBJECTIVE:  Note: Objective measures were completed at Evaluation unless otherwise noted.  DIAGNOSTIC FINDINGS:  Left Femur Radiograph on 09/19/2024: IMPRESSION: ORIF of proximal femur fracture, without immediate postoperative complication.  PATIENT SURVEYS:  LEFS  Extreme difficulty/unable (0), Quite a bit of difficulty (1), Moderate difficulty (2), Little difficulty (3), No difficulty (4) Survey date:  10/01/24  Any of your usual work, housework or school activities 2  2. Usual hobbies, recreational or sporting activities 0  3. Getting into/out of the bath 3  4. Walking between rooms 2  5. Putting on socks/shoes 4  6. Squatting  0  7. Lifting an object, like a bag of groceries from the floor 1  8. Performing light activities around your home 3  9. Performing heavy activities around your home 0  10. Getting into/out of a car 3  11. Walking 2 blocks 0  12. Walking 1 mile 0  13. Going up/down 10 stairs (1 flight) 0  14. Standing for 1 hour 0  15.  sitting for 1 hour 4  16. Running on even ground 0  17. Running on uneven ground 0  18. Making sharp turns  while running fast 0  19. Hopping  0  20. Rolling over in bed 2  Score total:  24/80 = 30%   11/21/24: 44/80=55%   COGNITION: Overall cognitive status: Within functional limits for tasks assessed     SENSATION: Patient denies numbness and tingling  MUSCLE LENGTH: Hamstrings: minimal tightness noted bilaterally  POSTURE: rounded shoulders  PALPATION: Tender to palpation along sight of surgery  LOWER EXTREMITY ROM:  Eval: WFL, but with pain at left hip  LOWER EXTREMITY MMT:  Eval: Right LE strength is WFL Left hip strength is 3/5 Left quad strength is 4-/5 Left hamstring strength is 4-/5   FUNCTIONAL TESTS:  Eval: 5 times sit to stand: 10.53 sec with unilateral hand on the RW Timed up and go (TUG): 13.24 sec with RW  10/24/24: 6 min walk test with  cane 1140 ft with 2-3/10 RPE TUG: with cane 7.26 seconds    11/21/24: 6 min walk test: 1392 without cane and 4/10 RPE GAIT: Distance walked: >100 ft Assistive device utilized: Single point cane  Level of assistance: Complete Independence Comments: Decreased weight bearing through LLE with antalgic gait noted                                                                                                                                TREATMENT DATE:  11/22/2023: Bike: level 4x 8 minutes -PT monitored for symptoms and assessed goals 6 min walk test & LEFS Seated hamstring stretch 2x20 seconds bil  Sit to stand x10 with Rt=Lt  15#  staggered stance each Attempted alternating step taps with kettlebell and pt was too dizzy today Seated ER with 5# ankle weight 2x15, long arc quads Lt 2x10   10/31/2024: Bike: level 4x 7 minutes -PT monitored for symptoms  Seated hamstring stretch 2x20 seconds bil  Farmer's carry: 10# kettle bell -1 lap around the building each hand Sit to stand 2x10 with Rt=Lt  10# first set, 15# second set, staggered stance  Alternating step taps on 6 step with 15# Farmer's carry 2x10 bil each Sidestepping at barre x 3 laps- yellow loop around ankles  Lunge on to bosu ball forward and lateral with intermittent UE support x10 each   Standing rockerboard x3 min Let press: seat 6 50# x10, 60# x10 with both legs    10/29/2024: Bike: level 4x 7 minutes (2 miles)-PT monitored for symptoms  Gait without cane using mirror as feedback, verbal and demo cues by PT for symmetry and pelvic/trunk rotation Seated hamstring stretch 2x20 seconds bil  Farmer's carry: 5# kettle bell -1 lap around the building each hand Sit to stand 2x10 with Rt=Lt  5# first set, 10# second set, staggered stance  Alternating step taps on 6 step with 5# Farmer's carry 2x10 bil each Sidestepping at barre x 3 laps- blue band around ankles  Hurdles at barre: forward with step-over-over step and  sidestepping with step-to x 3 laps each  Standing rockerboard x3 min Standing hip abduction 2x10 and extension 2x10 bil- standing on balance pad    PATIENT EDUCATION:  Education details: Issued HEP Person educated: Patient Education method: Explanation, Demonstration, and Handouts Education comprehension: verbalized understanding  HOME EXERCISE PROGRAM: Access Code: [email or web address] URL: [email or web address] Date: 10/01/2024 Prepared by: Jarrell Menke  Exercises - Sit to Stand with Armchair  - 1 x daily - 7 x weekly - 2 sets - 10 reps - Long Sitting Quad Set  - 1 x daily - 7 x weekly - 2 sets - 10 reps - Supine Active Straight Leg Raise  - 1 x daily - 7 x weekly - 2 sets - 10 reps - Supine Bridge  - 1 x daily - 7 x weekly - 2 sets - 10 reps - Clamshell  - 1 x daily - 7 x weekly - 2 sets - 10 reps - Sidelying Hip Abduction  - 1 x daily - 7 x weekly - 2 sets - 10 reps - Standing Heel Raise  - 1 x daily - 7 x weekly - 2 sets - 20 reps - Standing March with Counter Support  - 1 x daily - 7 x weekly - 1-2 sets - 10 reps  ASSESSMENT:  CLINICAL IMPRESSION: Lapse in treatment since 11/04/24.  6 min walk test and LEFS are both improved.  Pt with improved symmetry without device and this decreases when pt is fatigued.  He is negotiating steps with step-to gait and initially and then is able to utilize a reciprocal pattern.  Trendelenburg gait due to glute med weakness on the Lt.  Increased fatigue with session today with dizziness due to side effects from weaning from meds.  Limited standing activities due to this.   Pt is making steady progress PT monitored throughout and provided verbal cues.  He required verbal cues for farmer's carry with kettlebell for symmetry, trunk rotation and step length. Patient would benefit from skilled PT to progress towards goal related activities.   OBJECTIVE IMPAIRMENTS: Abnormal gait, decreased activity tolerance, decreased balance, difficulty walking,  decreased strength, increased muscle spasms, impaired flexibility, and pain.   ACTIVITY LIMITATIONS: carrying, lifting, bending, standing, squatting, sleeping, stairs, transfers, and locomotion level  PARTICIPATION LIMITATIONS: meal prep, cleaning, driving, community activity, and occupation  PERSONAL FACTORS: Fitness, Past/current experiences, and 3+ comorbidities: Hx of left tibial plateau Fx,  Psoriatic arthritis, Thrombocytopenia are also affecting patient's functional outcome.   REHAB POTENTIAL: Good  CLINICAL DECISION MAKING: Evolving/moderate complexity  EVALUATION COMPLEXITY: Moderate   GOALS: Goals reviewed with patient? Yes  SHORT TERM GOALS: Target date: 10/25/2024 Pt will be independent with initial HEP.  Baseline: 10/22/24 Goal status: MET  2.  Patient will participate in a 6 minute walk test to establish a baseline measurement. Baseline: 10/24/24 Goal status: MET  3.  Patient will report at least a 25% improvement in symptoms since starting PT. Baseline: 10/31/24 Goal status: MET   LONG TERM GOALS: Target date: 01/16/2025    Pt will be independent with advanced HEP to allow for self progression after discharge. Baseline: further advancement is needed (11/19/24) Goal status: In progress   2.  Ambulate all distances with symmetry without device due to increased strength.  Baseline: antalgia without cane with Trendelenburg with fatigue (11/21/24) Goal status: NEW  3.  Patient will improve Lower Extremity Functional Status to at least 62% to demonstrate improved functional mobility. Baseline: 44/80=55%  Goal status: In progress   4.  Patient will report ability to  return to hiking without increased pain. Baseline: has not been able to do this (11/21/24) Goal status: In progress   5.  Patient to increase LE strength to [facility] to allow him to negotiate stairs with reciprocal pattern. Baseline: step-to when initiating and then progresses to step-over-step Goal status:  in progress   6. Ambulate > or = to 1500 feet with 6 min walk test to improve community ambulation  Baseline: 1392 without cane (age related norm is 2215 feet)  Goal status: NEW   PLAN:  PT FREQUENCY: 1-2x/week  PT DURATION: 8 weeks  PLANNED INTERVENTIONS: [id number]- PT Re-evaluation, 97750- Physical Performance Testing, 97110-Therapeutic exercises, 97530- Therapeutic activity, [id number]- Neuromuscular re-education, 97535- Self Care, [id number]- Manual therapy, [id number]- Gait training, [phone]- Canalith repositioning, [id number]- Aquatic Therapy, [id number]- Electrical stimulation (unattended), [id number]- Electrical stimulation (manual), [id number]- Vasopneumatic device, [id number]- Ultrasound, [id number]- Ionotophoresis 4mg /ml Dexamethasone , 79439 (1-2 muscles), 20561 (3+ muscles)- Dry Needling, Patient/Family education, Balance training, Stair training, Taping, Joint mobilization, Joint manipulation, Scar mobilization, Vestibular training, Cryotherapy, and Moist heat  PLAN FOR NEXT SESSION: Assess and progress HEP as indicated, strengthening, flexibility, gait training. Resume standing and balance activities (pt was dizzy today)  Burnard Joy, PT 11/21/2024 12:29 PM   [facility] [address], [address] [place], [place] [address] Phone # [phone] Fax [phone]  "

## 2024-11-26 ENCOUNTER — Ambulatory Visit

## 2024-11-26 DIAGNOSIS — R262 Difficulty in walking, not elsewhere classified: Secondary | ICD-10-CM

## 2024-11-26 DIAGNOSIS — M5459 Other low back pain: Secondary | ICD-10-CM

## 2024-11-26 DIAGNOSIS — M6281 Muscle weakness (generalized): Secondary | ICD-10-CM

## 2024-11-26 DIAGNOSIS — M79605 Pain in left leg: Secondary | ICD-10-CM

## 2024-11-26 NOTE — Therapy (Signed)
 " OUTPATIENT PHYSICAL THERAPY TREATMENT   Patient Name: Ryan Lynch MRN: [id number] DOB:01-Jun-1981, 44 y.o., male Today's Date: 11/26/2024  END OF SESSION:  PT End of Session - 11/26/24 1101     Visit Number 9    Date for Recertification  01/16/25    Authorization Type BC/BS- approved 14 visits 11/18-2/16    Authorization - Visit Number 9    Authorization - Number of Visits 14    PT Start Time 1017    PT Stop Time 1101    PT Time Calculation (min) 44 min    Activity Tolerance Patient tolerated treatment well    Behavior During Therapy [facility] for tasks assessed/performed                 Past Medical History:  Diagnosis Date   Acute anemia 06/2022   Acute metabolic encephalopathy 06/2022   Acute urinary retention 06/2022   ADD (attention deficit disorder)    Adrenal nodule 06/2022   AKI (acute kidney injury) 06/2022   Broken arm 09/2023   Broken humerus    Closed nondisplaced fracture of left tibial plateau 07/2022   Multiple rib fractures 06/2022   Psoriasis    Psoriatic arthritis ([name])    Thrombocytopenia 06/2022   Traumatic rhabdomyolysis 06/2022   Past Surgical History:  Procedure Laterality Date   ANTERIOR CRUCIATE LIGAMENT REPAIR Right    ANTERIOR CRUCIATE LIGAMENT REPAIR Right    APPLICATION OF WOUND VAC Left 07/02/2022   Procedure: APPLICATION OF WOUND VAC;  Surgeon: Celena Sharper, MD;  Location: [facility] OR;  Service: Orthopedics;  Laterality: Left;   APPLICATION OF WOUND VAC Left 07/05/2022   Procedure: APPLICATION OF WOUND VAC;  Surgeon: Celena Sharper, MD;  Location: [facility] OR;  Service: Orthopedics;  Laterality: Left;   CLOSED REDUCTION TIBIA Left 07/02/2022   Procedure: CLOSED REDUCTION TIBIA;  Surgeon: Celena Sharper, MD;  Location: [facility] OR;  Service: Orthopedics;  Laterality: Left;   EXTERNAL FIXATION LEG Left 07/02/2022   Procedure: EXTERNAL FIXATION LEG;  Surgeon: Celena Sharper, MD;  Location: [facility] OR;  Service: Orthopedics;  Laterality: Left;   FASCIOTOMY  Left 07/02/2022   Procedure: FASCIOTOMY;  Surgeon: Celena Sharper, MD;  Location: [facility] OR;  Service: Orthopedics;  Laterality: Left;   I & D EXTREMITY Left 07/07/2022   Procedure: IRRIGATION AND DEBRIDEMENT EXTREMITY;  Surgeon: Celena Sharper, MD;  Location: [facility] OR;  Service: Orthopedics;  Laterality: Left;   INTRAMEDULLARY (IM) NAIL INTERTROCHANTERIC Left 09/19/2024   Procedure: FIXATION, FRACTURE, INTERTROCHANTERIC, WITH INTRAMEDULLARY ROD;  Surgeon: Celena Sharper, MD;  Location: [facility] OR;  Service: Orthopedics;  Laterality: Left;   ORIF TIBIA PLATEAU Left 07/05/2022   Procedure: OPEN REDUCTION INTERNAL FIXATION (ORIF) TIBIAL PLATEAU;  Surgeon: Celena Sharper, MD;  Location: [facility] OR;  Service: Orthopedics;  Laterality: Left;   SECONDARY CLOSURE OF WOUND Left 07/05/2022   Procedure: SECONDARY CLOSURE OF WOUND;  Surgeon: Celena Sharper, MD;  Location: [facility] OR;  Service: Orthopedics;  Laterality: Left;   SECONDARY CLOSURE OF WOUND Left 07/07/2022   Procedure: SECONDARY CLOSURE OF WOUND;  Surgeon: Celena Sharper, MD;  Location: [facility] OR;  Service: Orthopedics;  Laterality: Left;   SHOULDER SURGERY Bilateral    WISDOM TOOTH EXTRACTION  05/23/2023   Patient Active Problem List   Diagnosis Date Noted   Femur fracture ([name]) 09/18/2024   Psoriatic arthritis ([name]) 03/20/2023   High risk medication use 03/20/2023   Vitamin D  deficiency 03/20/2023   Generalized anxiety disorder    Closed nondisplaced fracture of  left tibial plateau 07/16/2022   Multiple rib fractures 07/11/2022   Compression fracture of body of thoracic vertebra ([name]) 07/11/2022   Sepsis ([name]) 07/09/2022   Acute urinary retention 07/09/2022   Prolonged QT interval 07/09/2022   AKI (acute kidney injury) 07/07/2022   Alcohol withdrawal ([name]) 07/05/2022   Acute anemia 07/05/2022   Psoriasis 07/05/2022   Ileus ([name]) 07/04/2022   Acute metabolic encephalopathy 07/04/2022   Traumatic rhabdomyolysis 07/03/2022   Observation after surgery  07/02/2022   Tibial plateau fracture, left 07/02/2022   Alcohol use 07/02/2022   Transaminitis 07/02/2022   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   Adrenal nodule 07/02/2022   Compression fracture of T7 vertebra ([name]) 07/02/2022   Thrombocytopenia 07/02/2022   Anxiety 07/02/2022    PCP: Does not currently have a PCP, but seeing other Rheumatologist regularly  REFERRING PROVIDER: Deward Eck, PA-C  REFERRING DIAG: D27.990J (ICD-10-CM) - Hip fracture ([name])  THERAPY DIAG:  Pain in left leg  Difficulty in walking, not elsewhere classified  Muscle weakness (generalized)  Other low back pain  Rationale for Evaluation and Treatment: Rehabilitation  ONSET DATE: 11/27/24  SUBJECTIVE:   SUBJECTIVE STATEMENT: I've been doing my exercises and I got a bike for home.   PERTINENT HISTORY: Hx of T7 compression Fracture, Hx of left tibial plateau Fx with multiple surgeries in 2023, Psoriatic arthritis, Thrombocytopenia PAIN: 11/26/24 Are you having pain? Yes: NPRS scale: 1/10 Pain location: left hip  Pain description: aching on the verge of sharp, irritating Aggravating factors: weight bearing Relieving factors: rest  PRECAUTIONS: None  RED FLAGS: None   WEIGHT BEARING RESTRICTIONS: WBAT with walker or crutches  FALLS:  Has patient fallen in last 6 months? Yes. Number of falls 1  LIVING ENVIRONMENT: Lives with: lives with their family Lives in: House/apartment Stairs: two level, but staying on the bottom floor Has following equipment at home: Vannie - 2 wheeled, Crutches, Wheelchair (manual), shower chair, and Grab bars  OCCUPATION: Works for the [employer], Also works as a architectural technologist  PLOF: Independent and Leisure: hiking, working out  PATIENT GOALS: To return back to normal activities and active lifestyle.  NEXT MD VISIT: November 19, 2024 with Rheumatology with Dr Jeannetta  OBJECTIVE:  Note: Objective measures were completed at Evaluation unless  otherwise noted.  DIAGNOSTIC FINDINGS:  Left Femur Radiograph on 09/19/2024: IMPRESSION: ORIF of proximal femur fracture, without immediate postoperative complication.  PATIENT SURVEYS:  LEFS  Extreme difficulty/unable (0), Quite a bit of difficulty (1), Moderate difficulty (2), Little difficulty (3), No difficulty (4) Survey date:  10/01/24  Any of your usual work, housework or school activities 2  2. Usual hobbies, recreational or sporting activities 0  3. Getting into/out of the bath 3  4. Walking between rooms 2  5. Putting on socks/shoes 4  6. Squatting  0  7. Lifting an object, like a bag of groceries from the floor 1  8. Performing light activities around your home 3  9. Performing heavy activities around your home 0  10. Getting into/out of a car 3  11. Walking 2 blocks 0  12. Walking 1 mile 0  13. Going up/down 10 stairs (1 flight) 0  14. Standing for 1 hour 0  15.  sitting for 1 hour 4  16. Running on even ground 0  17. Running on uneven ground 0  18. Making sharp turns while running fast 0  19. Hopping  0  20. Rolling over in bed 2  Score total:  24/80 = 30%   11/21/24: 44/80=55%   COGNITION: Overall cognitive status: Within functional limits for tasks assessed     SENSATION: Patient denies numbness and tingling  MUSCLE LENGTH: Hamstrings: minimal tightness noted bilaterally  POSTURE: rounded shoulders  PALPATION: Tender to palpation along sight of surgery  LOWER EXTREMITY ROM:  Eval: WFL, but with pain at left hip  LOWER EXTREMITY MMT:  Eval: Right LE strength is WFL Left hip strength is 3/5 Left quad strength is 4-/5 Left hamstring strength is 4-/5   FUNCTIONAL TESTS:  Eval: 5 times sit to stand: 10.53 sec with unilateral hand on the RW Timed up and go (TUG): 13.24 sec with RW  10/24/24: 6 min walk test with cane 1140 ft with 2-3/10 RPE TUG: with cane 7.26 seconds    11/21/24: 6 min walk test: 1392 without cane and 4/10  RPE GAIT: Distance walked: >100 ft Assistive device utilized: Single point cane  Level of assistance: Complete Independence Comments: Decreased weight bearing through LLE with antalgic gait noted                                                                                                                                TREATMENT DATE:  11/26/24:  Bike: level 4x 7 minutes -PT monitored for symptoms  Seated hamstring stretch 2x20 seconds bil  Farmer's carry: 10# kettle bell -1 lap around the building each hand Sit to stand 2x10 with Rt=Lt  15# kettlebell, staggered stance  2x10 each position Alternating step taps on 6 step with 15# Farmer's carry 2x10 bil each Sidestepping at barre x 4 laps- yellow loop around ankles  Lunge on to bosu ball forward  intermittent UE support 2x10 each   Standing rockerboard x3 min Let press: seat 6  70# 2x10 with both legs,  30# Lt LE 2x10   11/22/2023: Bike: level 4x 8 minutes -PT monitored for symptoms and assessed goals 6 min walk test & LEFS Seated hamstring stretch 2x20 seconds bil  Sit to stand x10 with Rt=Lt  15#  staggered stance each Attempted alternating step taps with kettlebell and pt was too dizzy today Seated ER with 5# ankle weight 2x15, long arc quads Lt 2x10   10/31/2024: Bike: level 4x 7 minutes -PT monitored for symptoms  Seated hamstring stretch 2x20 seconds bil  Farmer's carry: 10# kettle bell -1 lap around the building each hand Sit to stand 2x10 with Rt=Lt  10# first set, 15# second set, staggered stance  Alternating step taps on 6 step with 15# Farmer's carry 2x10 bil each Sidestepping at barre x 3 laps- yellow loop around ankles  Lunge on to bosu ball forward and lateral with intermittent UE support x10 each   Standing rockerboard x3 min Let press: seat 6 50# x10, 60# x10 with both legs    PATIENT EDUCATION:  Education details: Issued HEP Person educated: Patient Education method: Explanation, Demonstration, and  Handouts Education comprehension: verbalized  understanding  HOME EXERCISE PROGRAM: Access Code: [email or web address] URL: [email or web address] Date: 10/01/2024 Prepared by: Jarrell Menke  Exercises - Sit to Stand with Armchair  - 1 x daily - 7 x weekly - 2 sets - 10 reps - Long Sitting Quad Set  - 1 x daily - 7 x weekly - 2 sets - 10 reps - Supine Active Straight Leg Raise  - 1 x daily - 7 x weekly - 2 sets - 10 reps - Supine Bridge  - 1 x daily - 7 x weekly - 2 sets - 10 reps - Clamshell  - 1 x daily - 7 x weekly - 2 sets - 10 reps - Sidelying Hip Abduction  - 1 x daily - 7 x weekly - 2 sets - 10 reps - Standing Heel Raise  - 1 x daily - 7 x weekly - 2 sets - 20 reps - Standing March with Counter Support  - 1 x daily - 7 x weekly - 1-2 sets - 10 reps  ASSESSMENT:  CLINICAL IMPRESSION: Pt was able to return to prior level of exercises and make some advancements today.  He demonstrates antalgia initially upon standing due to stiffness and at the end of walking with weight due to fatigue of the Lt LE.   Pt is making steady progress PT monitored throughout and provided verbal cues.  He required verbal cues for farmer's carry with kettlebell for symmetry, trunk rotation and step length. Patient would benefit from skilled PT to progress towards goal related activities.   OBJECTIVE IMPAIRMENTS: Abnormal gait, decreased activity tolerance, decreased balance, difficulty walking, decreased strength, increased muscle spasms, impaired flexibility, and pain.   ACTIVITY LIMITATIONS: carrying, lifting, bending, standing, squatting, sleeping, stairs, transfers, and locomotion level  PARTICIPATION LIMITATIONS: meal prep, cleaning, driving, community activity, and occupation  PERSONAL FACTORS: Fitness, Past/current experiences, and 3+ comorbidities: Hx of left tibial plateau Fx,  Psoriatic arthritis, Thrombocytopenia are also affecting patient's functional outcome.   REHAB POTENTIAL:  Good  CLINICAL DECISION MAKING: Evolving/moderate complexity  EVALUATION COMPLEXITY: Moderate   GOALS: Goals reviewed with patient? Yes  SHORT TERM GOALS: Target date: 10/25/2024 Pt will be independent with initial HEP.  Baseline: 10/22/24 Goal status: MET  2.  Patient will participate in a 6 minute walk test to establish a baseline measurement. Baseline: 10/24/24 Goal status: MET  3.  Patient will report at least a 25% improvement in symptoms since starting PT. Baseline: 10/31/24 Goal status: MET   LONG TERM GOALS: Target date: 01/16/2025    Pt will be independent with advanced HEP to allow for self progression after discharge. Baseline: further advancement is needed (11/19/24) Goal status: In progress   2.  Ambulate all distances with symmetry without device due to increased strength.  Baseline: antalgia without cane with Trendelenburg with fatigue (11/21/24) Goal status: NEW  3.  Patient will improve Lower Extremity Functional Status to at least 62% to demonstrate improved functional mobility. Baseline: 44/80=55%  Goal status: In progress   4.  Patient will report ability to return to hiking without increased pain. Baseline: has not been able to do this (11/21/24) Goal status: In progress   5.  Patient to increase LE strength to [facility] to allow him to negotiate stairs with reciprocal pattern. Baseline: step-to when initiating and then progresses to step-over-step Goal status: in progress   6. Ambulate > or = to 1500 feet with 6 min walk test to improve community ambulation  Baseline: 1392 without cane (age related norm is  2215 feet)  Goal status: NEW   PLAN:  PT FREQUENCY: 1-2x/week  PT DURATION: 8 weeks  PLANNED INTERVENTIONS: 97164- PT Re-evaluation, 97750- Physical Performance Testing, 97110-Therapeutic exercises, 97530- Therapeutic activity, [id number]- Neuromuscular re-education, 97535- Self Care, 02859- Manual therapy, [phone]- Gait training, [phone]- Canalith  repositioning, [id number]- Aquatic Therapy, [phone]- Electrical stimulation (unattended), [phone]- Electrical stimulation (manual), [id number]- Vasopneumatic device, [id number]- Ultrasound, [id number]- Ionotophoresis 4mg /ml Dexamethasone , 79439 (1-2 muscles), 20561 (3+ muscles)- Dry Needling, Patient/Family education, Balance training, Stair training, Taping, Joint mobilization, Joint manipulation, Scar mobilization, Vestibular training, Cryotherapy, and Moist heat  PLAN FOR NEXT SESSION: Assess and progress HEP as indicated, strengthening, flexibility, gait training.   Burnard Joy, PT 11/26/2024 11:02 AM   [facility] [address] [place], [place] [address] Phone # [phone] Fax [phone]  "

## 2024-11-28 ENCOUNTER — Ambulatory Visit

## 2024-11-28 DIAGNOSIS — M79605 Pain in left leg: Secondary | ICD-10-CM

## 2024-11-28 DIAGNOSIS — R262 Difficulty in walking, not elsewhere classified: Secondary | ICD-10-CM

## 2024-11-28 DIAGNOSIS — M6281 Muscle weakness (generalized): Secondary | ICD-10-CM

## 2024-11-28 NOTE — Therapy (Signed)
 " OUTPATIENT PHYSICAL THERAPY TREATMENT   Patient Name: Ryan Lynch MRN: [id number] DOB:July 23, 1981, 44 y.o., male Today's Date: 11/28/2024  END OF SESSION:  PT End of Session - 11/28/24 1145     Visit Number 10    Date for Recertification  01/16/25    Authorization Type BC/BS- approved 14 visits 11/18-2/16    Authorization - Visit Number 10    Authorization - Number of Visits 14    PT Start Time 1101    PT Stop Time 1146    PT Time Calculation (min) 45 min    Activity Tolerance Patient tolerated treatment well    Behavior During Therapy [facility] for tasks assessed/performed                  Past Medical History:  Diagnosis Date   Acute anemia 06/2022   Acute metabolic encephalopathy 06/2022   Acute urinary retention 06/2022   ADD (attention deficit disorder)    Adrenal nodule 06/2022   AKI (acute kidney injury) 06/2022   Broken arm 09/2023   Broken humerus    Closed nondisplaced fracture of left tibial plateau 07/2022   Multiple rib fractures 06/2022   Psoriasis    Psoriatic arthritis ([name])    Thrombocytopenia 06/2022   Traumatic rhabdomyolysis 06/2022   Past Surgical History:  Procedure Laterality Date   ANTERIOR CRUCIATE LIGAMENT REPAIR Right    ANTERIOR CRUCIATE LIGAMENT REPAIR Right    APPLICATION OF WOUND VAC Left 07/02/2022   Procedure: APPLICATION OF WOUND VAC;  Surgeon: Celena Sharper, MD;  Location: [facility] OR;  Service: Orthopedics;  Laterality: Left;   APPLICATION OF WOUND VAC Left 07/05/2022   Procedure: APPLICATION OF WOUND VAC;  Surgeon: Celena Sharper, MD;  Location: [facility] OR;  Service: Orthopedics;  Laterality: Left;   CLOSED REDUCTION TIBIA Left 07/02/2022   Procedure: CLOSED REDUCTION TIBIA;  Surgeon: Celena Sharper, MD;  Location: [facility] OR;  Service: Orthopedics;  Laterality: Left;   EXTERNAL FIXATION LEG Left 07/02/2022   Procedure: EXTERNAL FIXATION LEG;  Surgeon: Celena Sharper, MD;  Location: [facility] OR;  Service: Orthopedics;  Laterality: Left;    FASCIOTOMY Left 07/02/2022   Procedure: FASCIOTOMY;  Surgeon: Celena Sharper, MD;  Location: [facility] OR;  Service: Orthopedics;  Laterality: Left;   I & D EXTREMITY Left 07/07/2022   Procedure: IRRIGATION AND DEBRIDEMENT EXTREMITY;  Surgeon: Celena Sharper, MD;  Location[facility] OR;  Service: Orthopedics;  Laterality: Left;   INTRAMEDULLARY (IM) NAIL INTERTROCHANTERIC Left 09/19/2024   Procedure: FIXATION, FRACTURE, INTERTROCHANTERIC, WITH INTRAMEDULLARY ROD;  Surgeon: Celena Sharper, MD;  Location: [facility] OR;  Service: Orthopedics;  Laterality: Left;   ORIF TIBIA PLATEAU Left 07/05/2022   Procedure: OPEN REDUCTION INTERNAL FIXATION (ORIF) TIBIAL PLATEAU;  Surgeon: Celena Sharper, MD;  Location: [facility] OR;  Service: Orthopedics;  Laterality: Left;   SECONDARY CLOSURE OF WOUND Left 07/05/2022   Procedure: SECONDARY CLOSURE OF WOUND;  Surgeon: Celena Sharper, MD;  Location: [facility] OR;  Service: Orthopedics;  Laterality: Left;   SECONDARY CLOSURE OF WOUND Left 07/07/2022   Procedure: SECONDARY CLOSURE OF WOUND;  Surgeon: Celena Sharper, MD;  Location: [facility] OR;  Service: Orthopedics;  Laterality: Left;   SHOULDER SURGERY Bilateral    WISDOM TOOTH EXTRACTION  05/23/2023   Patient Active Problem List   Diagnosis Date Noted   Femur fracture ([name]) 09/18/2024   Psoriatic arthritis ([name]) 03/20/2023   High risk medication use 03/20/2023   Vitamin D  deficiency 03/20/2023   Generalized anxiety disorder    Closed nondisplaced fracture  of left tibial plateau 07/16/2022   Multiple rib fractures 07/11/2022   Compression fracture of body of thoracic vertebra ([name]) 07/11/2022   Sepsis ([name]) 07/09/2022   Acute urinary retention 07/09/2022   Prolonged QT interval 07/09/2022   AKI (acute kidney injury) 07/07/2022   Alcohol withdrawal ([name]) 07/05/2022   Acute anemia 07/05/2022   Psoriasis 07/05/2022   Ileus ([name]) 07/04/2022   Acute metabolic encephalopathy 07/04/2022   Traumatic rhabdomyolysis 07/03/2022   Observation after  surgery 07/02/2022   Tibial plateau fracture, left 07/02/2022   Alcohol use 07/02/2022   Transaminitis 07/02/2022   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  He couldn't believe how well I was walking   PERTINENT HISTORY: Hx of T7 compression Fracture, Hx of left tibial plateau Fx with multiple surgeries in 2023, Psoriatic arthritis, Thrombocytopenia PAIN: 11/26/24 Are you having pain? Yes: NPRS scale: 1/10 Pain location: left hip  Pain description: aching on the verge of sharp, irritating Aggravating factors: weight bearing Relieving factors: rest  PRECAUTIONS: None  RED FLAGS: None   WEIGHT BEARING RESTRICTIONS: WBAT with walker or crutches  FALLS:  Has patient fallen in last 6 months? Yes. Number of falls 1  LIVING ENVIRONMENT: Lives with: lives with their family Lives in: House/apartment Stairs: two level, but staying on the bottom floor Has following equipment at home: Vannie - 2 wheeled, Crutches, Wheelchair (manual), shower chair, and Grab bars  OCCUPATION: Works for the [employer], Also works as a architectural technologist  PLOF: Independent and Leisure: hiking, working out  PATIENT GOALS: To return back to normal activities and active lifestyle.  NEXT MD VISIT: November 19, 2024 with Rheumatology with Dr Jeannetta  OBJECTIVE:  Note: Objective measures were completed at  Evaluation unless otherwise noted.  DIAGNOSTIC FINDINGS:  Left Femur Radiograph on 09/19/2024: IMPRESSION: ORIF of proximal femur fracture, without immediate postoperative complication.  PATIENT SURVEYS:  LEFS  Extreme difficulty/unable (0), Quite a bit of difficulty (1), Moderate difficulty (2), Little difficulty (3), No difficulty (4) Survey date:  10/01/24  Any of your usual work, housework or school activities 2  2. Usual hobbies, recreational or sporting activities 0  3. Getting into/out of the bath 3  4. Walking between rooms 2  5. Putting on socks/shoes 4  6. Squatting  0  7. Lifting an object, like a bag of groceries from the floor 1  8. Performing light activities around your home 3  9. Performing heavy activities around your home 0  10. Getting into/out of a car 3  11. Walking 2 blocks 0  12. Walking 1 mile 0  13. Going up/down 10 stairs (1 flight) 0  14. Standing for 1 hour 0  15.  sitting for 1 hour 4  16. Running on even ground 0  17. Running on uneven ground 0  18. Making sharp turns while running fast 0  19. Hopping  0  20. Rolling over in bed  2  Score total:  24/80 = 30%   11/21/24: 44/80=55%   COGNITION: Overall cognitive status: Within functional limits for tasks assessed     SENSATION: Patient denies numbness and tingling  MUSCLE LENGTH: Hamstrings: minimal tightness noted bilaterally  POSTURE: rounded shoulders  PALPATION: Tender to palpation along sight of surgery  LOWER EXTREMITY ROM:  Eval: WFL, but with pain at left hip  LOWER EXTREMITY MMT:  Eval: Right LE strength is WFL Left hip strength is 3/5 Left quad strength is 4-/5 Left hamstring strength is 4-/5   FUNCTIONAL TESTS:  Eval: 5 times sit to stand: 10.53 sec with unilateral hand on the RW Timed up and go (TUG): 13.24 sec with RW  10/24/24: 6 min walk test with cane 1140 ft with 2-3/10 RPE TUG: with cane 7.26 seconds    11/21/24: 6 min walk test: 1392 without cane and 4/10  RPE GAIT: Distance walked: >100 ft Assistive device utilized: Single point cane  Level of assistance: Complete Independence Comments: Decreased weight bearing through LLE with antalgic gait noted                                                                                                                                TREATMENT DATE:  11/28/24:  Bike: level 4x 8 minutes -PT monitored for symptoms  Seated hamstring stretch 2x20 seconds bil  Resisted walking: forward and reverse 15# x5 each, sidestepping bil x5 each 10#  Farmer's carry: 10# kettle bell -1 lap around the building each hand Sit to stand 2x10 with Rt=Lt  15# kettlebell, staggered stance  2x10 each position Alternating step taps on 6 step with 15# Farmer's carry 2x10 bil each Lunge on to bosu ball forward  intermittent UE support 2x10 each   Standing rockerboard x3 min Let press: seat 6  70# 2x10 with both legs,  40# Lt LE 2x10   11/26/24:  Bike: level 4x 7 minutes -PT monitored for symptoms  Seated hamstring stretch 2x20 seconds bil  Farmer's carry: 10# kettle bell -1 lap around the building each hand Sit to stand 2x10 with Rt=Lt  15# kettlebell, staggered stance  2x10 each position Alternating step taps on 6 step with 15# Farmer's carry 2x10 bil each Sidestepping at barre x 4 laps- yellow loop around ankles  Lunge on to bosu ball forward  intermittent UE support 2x10 each   Standing rockerboard x3 min Let press: seat 6  70# 2x10 with both legs,  30# Lt LE 2x10   11/22/2023: Bike: level 4x 8 minutes -PT monitored for symptoms and assessed goals 6 min walk test & LEFS Seated hamstring stretch 2x20 seconds bil  Sit to stand x10 with Rt=Lt  15#  staggered stance each Attempted alternating step taps with kettlebell and pt was too dizzy today Seated ER with 5# ankle weight 2x15, long arc quads Lt 2x10   PATIENT EDUCATION:  Education details: Issued HEP Person educated: Patient Education  method: Explanation,  Demonstration, and Handouts Education comprehension: verbalized understanding  HOME EXERCISE PROGRAM: Access Code: [email or web address] URL: [email or web address] Date: 10/01/2024 Prepared by: Jarrell Menke  Exercises - Sit to Stand with Armchair  - 1 x daily - 7 x weekly - 2 sets - 10 reps - Long Sitting Quad Set  - 1 x daily - 7 x weekly - 2 sets - 10 reps - Supine Active Straight Leg Raise  - 1 x daily - 7 x weekly - 2 sets - 10 reps - Supine Bridge  - 1 x daily - 7 x weekly - 2 sets - 10 reps - Clamshell  - 1 x daily - 7 x weekly - 2 sets - 10 reps - Sidelying Hip Abduction  - 1 x daily - 7 x weekly - 2 sets - 10 reps - Standing Heel Raise  - 1 x daily - 7 x weekly - 2 sets - 20 reps - Standing March with Counter Support  - 1 x daily - 7 x weekly - 1-2 sets - 10 reps  ASSESSMENT:  CLINICAL IMPRESSION: Pt was sore after last session due to return to prior exercises.He was able to make some advancements again today.  Pt advanced weight for single leg press and performed resisted walking with good form and control. He is challenged with stance on Lt LE.  Pt is making steady progress PT monitored throughout and provided verbal cues.  He required verbal cues for farmer's carry with kettlebell for symmetry, trunk rotation and step length. Patient would benefit from skilled PT to progress towards goal related activities.   OBJECTIVE IMPAIRMENTS: Abnormal gait, decreased activity tolerance, decreased balance, difficulty walking, decreased strength, increased muscle spasms, impaired flexibility, and pain.   ACTIVITY LIMITATIONS: carrying, lifting, bending, standing, squatting, sleeping, stairs, transfers, and locomotion level  PARTICIPATION LIMITATIONS: meal prep, cleaning, driving, community activity, and occupation  PERSONAL FACTORS: Fitness, Past/current experiences, and 3+ comorbidities: Hx of left tibial plateau Fx,  Psoriatic arthritis, Thrombocytopenia are also affecting patient's  functional outcome.   REHAB POTENTIAL: Good  CLINICAL DECISION MAKING: Evolving/moderate complexity  EVALUATION COMPLEXITY: Moderate   GOALS: Goals reviewed with patient? Yes  SHORT TERM GOALS: Target date: 10/25/2024 Pt will be independent with initial HEP.  Baseline: 10/22/24 Goal status: MET  2.  Patient will participate in a 6 minute walk test to establish a baseline measurement. Baseline: 10/24/24 Goal status: MET  3.  Patient will report at least a 25% improvement in symptoms since starting PT. Baseline: 10/31/24 Goal status: MET   LONG TERM GOALS: Target date: 01/16/2025    Pt will be independent with advanced HEP to allow for self progression after discharge. Baseline: further advancement is needed (11/19/24) Goal status: In progress   2.  Ambulate all distances with symmetry without device due to increased strength.  Baseline: antalgia without cane with Trendelenburg with fatigue (11/21/24) Goal status: NEW  3.  Patient will improve Lower Extremity Functional Status to at least 62% to demonstrate improved functional mobility. Baseline: 44/80=55%  Goal status: In progress   4.  Patient will report ability to return to hiking without increased pain. Baseline: has not been able to do this (11/21/24) Goal status: In progress   5.  Patient to increase LE strength to [facility] to allow him to negotiate stairs with reciprocal pattern. Baseline: step-to when initiating and then progresses to step-over-step Goal status: in progress   6. Ambulate > or = to 1500 feet with 6 min walk test  to improve community ambulation  Baseline: 1392 without cane (age related norm is 2215 feet)  Goal status: NEW   PLAN:  PT FREQUENCY: 1-2x/week  PT DURATION: 8 weeks  PLANNED INTERVENTIONS: [id number]- PT Re-evaluation, 97750- Physical Performance Testing, 97110-Therapeutic exercises, 97530- Therapeutic activity, [id number]- Neuromuscular re-education, 97535- Self Care, [id number]- Manual therapy, [id number]-  Gait training, [phone]- Canalith repositioning, [id number]- Aquatic Therapy, [id number]- Electrical stimulation (unattended), [phone]- Electrical stimulation (manual), [id number]- Vasopneumatic device, [id number]- Ultrasound, [id number]- Ionotophoresis 4mg /ml Dexamethasone , [id number] (1-2 muscles), [id number] (3+ muscles)- Dry Needling, Patient/Family education, Balance training, Stair training, Taping, Joint mobilization, Joint manipulation, Scar mobilization, Vestibular training, Cryotherapy, and Moist heat  PLAN FOR NEXT SESSION: Assess and progress HEP as indicated, strengthening, flexibility, gait training.   Burnard Joy, PT 11/28/24 11:48 AM   [facility] [address] [place], [place] [address] Phone # [phone] Fax [phone]  "

## 2024-12-05 ENCOUNTER — Ambulatory Visit

## 2024-12-05 DIAGNOSIS — M6281 Muscle weakness (generalized): Secondary | ICD-10-CM

## 2024-12-05 DIAGNOSIS — M79605 Pain in left leg: Secondary | ICD-10-CM

## 2024-12-05 DIAGNOSIS — M5459 Other low back pain: Secondary | ICD-10-CM

## 2024-12-05 DIAGNOSIS — R262 Difficulty in walking, not elsewhere classified: Secondary | ICD-10-CM

## 2024-12-05 NOTE — Therapy (Unsigned)
 " OUTPATIENT PHYSICAL THERAPY TREATMENT   Patient Name: Ryan Lynch MRN: [id number] DOB:June 01, 1981, 44 y.o., male Today's Date: 12/06/2024  END OF SESSION:  PT End of Session - 12/06/24 0849     Visit Number 11    Authorization Type BC/BS- approved 14 visits 11/18-2/16    Authorization - Visit Number 11    Authorization - Number of Visits 14    PT Start Time 1231    PT Stop Time 1312    PT Time Calculation (min) 41 min    Activity Tolerance Patient tolerated treatment well    Behavior During Therapy [facility] for tasks assessed/performed                   Past Medical History:  Diagnosis Date   Acute anemia 06/2022   Acute metabolic encephalopathy 06/2022   Acute urinary retention 06/2022   ADD (attention deficit disorder)    Adrenal nodule 06/2022   AKI (acute kidney injury) 06/2022   Broken arm 09/2023   Broken humerus    Closed nondisplaced fracture of left tibial plateau 07/2022   Multiple rib fractures 06/2022   Psoriasis    Psoriatic arthritis ([name])    Thrombocytopenia 06/2022   Traumatic rhabdomyolysis 06/2022   Past Surgical History:  Procedure Laterality Date   ANTERIOR CRUCIATE LIGAMENT REPAIR Right    ANTERIOR CRUCIATE LIGAMENT REPAIR Right    APPLICATION OF WOUND VAC Left 07/02/2022   Procedure: APPLICATION OF WOUND VAC;  Surgeon: Celena Sharper, MD;  Location: [facility] OR;  Service: Orthopedics;  Laterality: Left;   APPLICATION OF WOUND VAC Left 07/05/2022   Procedure: APPLICATION OF WOUND VAC;  Surgeon: Celena Sharper, MD;  Location: [facility] OR;  Service: Orthopedics;  Laterality: Left;   CLOSED REDUCTION TIBIA Left 07/02/2022   Procedure: CLOSED REDUCTION TIBIA;  Surgeon: Celena Sharper, MD;  Location: [facility] OR;  Service: Orthopedics;  Laterality: Left;   EXTERNAL FIXATION LEG Left 07/02/2022   Procedure: EXTERNAL FIXATION LEG;  Surgeon: Celena Sharper, MD;  Location: [facility] OR;  Service: Orthopedics;  Laterality: Left;   FASCIOTOMY Left 07/02/2022   Procedure:  FASCIOTOMY;  Surgeon: Celena Sharper, MD;  Location: [facility] OR;  Service: Orthopedics;  Laterality: Left;   I & D EXTREMITY Left 07/07/2022   Procedure: IRRIGATION AND DEBRIDEMENT EXTREMITY;  Surgeon: Celena Sharper, MD;  Location[facility] OR;  Service: Orthopedics;  Laterality: Left;   INTRAMEDULLARY (IM) NAIL INTERTROCHANTERIC Left 09/19/2024   Procedure: FIXATION, FRACTURE, INTERTROCHANTERIC, WITH INTRAMEDULLARY ROD;  Surgeon: Celena Sharper, MD;  Location: [facility] OR;  Service: Orthopedics;  Laterality: Left;   ORIF TIBIA PLATEAU Left 07/05/2022   Procedure: OPEN REDUCTION INTERNAL FIXATION (ORIF) TIBIAL PLATEAU;  Surgeon: Celena Sharper, MD;  Location: [facility] OR;  Service: Orthopedics;  Laterality: Left;   SECONDARY CLOSURE OF WOUND Left 07/05/2022   Procedure: SECONDARY CLOSURE OF WOUND;  Surgeon: Celena Sharper, MD;  Location: [facility] OR;  Service: Orthopedics;  Laterality: Left;   SECONDARY CLOSURE OF WOUND Left 07/07/2022   Procedure: SECONDARY CLOSURE OF WOUND;  Surgeon: Celena Sharper, MD;  Location: [facility] OR;  Service: Orthopedics;  Laterality: Left;   SHOULDER SURGERY Bilateral    WISDOM TOOTH EXTRACTION  05/23/2023   Patient Active Problem List   Diagnosis Date Noted   Femur fracture ([name]) 09/18/2024   Psoriatic arthritis ([name]) 03/20/2023   High risk medication use 03/20/2023   Vitamin D  deficiency 03/20/2023   Generalized anxiety disorder    Closed nondisplaced fracture of left tibial plateau 07/16/2022  Multiple rib fractures 07/11/2022   Compression fracture of body of thoracic vertebra ([name]) 07/11/2022   Sepsis ([name]) 07/09/2022   Acute urinary retention 07/09/2022   Prolonged QT interval 07/09/2022   AKI (acute kidney injury) 07/07/2022   Alcohol withdrawal ([name]) 07/05/2022   Acute anemia 07/05/2022   Psoriasis 07/05/2022   Ileus ([name]) 07/04/2022   Acute metabolic encephalopathy 07/04/2022   Traumatic rhabdomyolysis 07/03/2022   Observation after surgery 07/02/2022   Tibial plateau  fracture, left 07/02/2022   Alcohol use 07/02/2022   Transaminitis 07/02/2022   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  Busy at home.   PERTINENT HISTORY: Hx of T7 compression Fracture, Hx of left tibial plateau Fx with multiple surgeries in 2023, Psoriatic arthritis, Thrombocytopenia PAIN: 12/05/24 Are you having pain? Yes: NPRS scale: 0/10 Pain location: left hip  Pain description: aching on the verge of sharp, irritating Aggravating factors: weight bearing Relieving factors: rest  PRECAUTIONS: None  RED FLAGS: None   WEIGHT BEARING RESTRICTIONS: WBAT with walker or crutches  FALLS:  Has patient fallen in last 6 months? Yes. Number of falls 1  LIVING ENVIRONMENT: Lives with: lives with their family Lives in: House/apartment Stairs: two level, but staying on the bottom floor Has following equipment at home: Vannie - 2 wheeled, Crutches, Wheelchair (manual), shower chair, and Grab bars  OCCUPATION: Works for the [employer], Also works as a architectural technologist  PLOF: Independent and Leisure: hiking, working out  PATIENT GOALS: To return back to normal activities and active lifestyle.  NEXT MD VISIT: November 19, 2024 with Rheumatology with Dr Jeannetta  OBJECTIVE:  Note: Objective measures were completed at Evaluation unless otherwise noted.  DIAGNOSTIC FINDINGS:  Left Femur  Radiograph on 09/19/2024: IMPRESSION: ORIF of proximal femur fracture, without immediate postoperative complication.  PATIENT SURVEYS:  LEFS  Extreme difficulty/unable (0), Quite a bit of difficulty (1), Moderate difficulty (2), Little difficulty (3), No difficulty (4) Survey date:  10/01/24  Any of your usual work, housework or school activities 2  2. Usual hobbies, recreational or sporting activities 0  3. Getting into/out of the bath 3  4. Walking between rooms 2  5. Putting on socks/shoes 4  6. Squatting  0  7. Lifting an object, like a bag of groceries from the floor 1  8. Performing light activities around your home 3  9. Performing heavy activities around your home 0  10. Getting into/out of a car 3  11. Walking 2 blocks 0  12. Walking 1 mile 0  13. Going up/down 10 stairs (1 flight) 0  14. Standing for 1 hour 0  15.  sitting for 1 hour 4  16. Running on even ground 0  17. Running on uneven ground 0  18. Making sharp turns while running fast 0  19. Hopping  0  20. Rolling over in bed 2  Score total:  24/80 = 30%   11/21/24: 44/80=55%  COGNITION: Overall cognitive status: Within functional limits for tasks assessed     SENSATION: Patient denies numbness and tingling  MUSCLE LENGTH: Hamstrings: minimal tightness noted bilaterally  POSTURE: rounded shoulders  PALPATION: Tender to palpation along sight of surgery  LOWER EXTREMITY ROM:  Eval: WFL, but with pain at left hip  LOWER EXTREMITY MMT:  Eval: Right LE strength is WFL Left hip strength is 3/5 Left quad strength is 4-/5 Left hamstring strength is 4-/5   FUNCTIONAL TESTS:  Eval: 5 times sit to stand: 10.53 sec with unilateral hand on the RW Timed up and go (TUG): 13.24 sec with RW  10/24/24: 6 min walk test with cane 1140 ft with 2-3/10 RPE TUG: with cane 7.26 seconds    11/21/24: 6 min walk test: 1392 without cane and 4/10 RPE GAIT: Distance walked: >100 ft Assistive device utilized: Single  point cane  Level of assistance: Complete Independence Comments: Decreased weight bearing through LLE with antalgic gait noted                                                                                                                                TREATMENT DATE:  12/05/24:  Bike: level 4x 8 minutes -PT monitored for symptoms  Seated hamstring stretch 2x20 seconds bil  Hip matrix: 45# abduction and extension 2x10 bil each  Resisted walking: forward and reverse 15# x5 each, sidestepping bil x5 each 10#  Farmer's carry: 10# kettle bell -1 lap around the building each hand Sit to stand 2x10 with Rt=Lt  15# kettlebell, staggered stance  2x10 each position Alternating step taps on 6 step with 15# Farmer's carry 2x10 bil each Lunge on to bosu ball forward  intermittent UE support 2x10 each   Standing rockerboard x3 min Let press: seat 6  70# 2x10 with both legs,  45# Lt LE 2x10   11/28/24:  Bike: level 4x 8 minutes -PT monitored for symptoms  Seated hamstring stretch 2x20 seconds bil  Resisted walking: forward and reverse 15# x5 each, sidestepping bil x5 each 10#  Farmer's carry: 10# kettle bell -1 lap around the building each hand Sit to stand 2x10 with Rt=Lt  15# kettlebell, staggered stance  2x10 each position Alternating step taps on 6 step with 15# Farmer's carry 2x10 bil each Lunge on to bosu ball forward  intermittent UE support 2x10 each   Standing rockerboard x3 min Let press: seat 6  70# 2x10 with both legs,  40# Lt LE 2x10   11/26/24:  Bike: level 4x 7 minutes -PT monitored for symptoms  Seated hamstring stretch 2x20 seconds bil  Farmer's carry: 10# kettle bell -1 lap around the building each hand Sit to stand 2x10 with Rt=Lt  15# kettlebell, staggered stance  2x10 each position Alternating step taps on 6 step with 15# Farmer's carry 2x10 bil each Sidestepping at barre x 4 laps- yellow loop around ankles  Lunge on to bosu ball forward  intermittent UE support 2x10  each   Standing rockerboard x3 min Let press: seat 6  70# 2x10 with both legs,  30# Lt LE 2x10    PATIENT EDUCATION:  Education details: Issued HEP Person educated: Patient Education method: Explanation, Demonstration, and Handouts Education comprehension: verbalized understanding  HOME EXERCISE PROGRAM: Access Code: [email or web address] URL: [email or web address] Date: 10/01/2024 Prepared by: Jarrell Menke  Exercises - Sit to Stand with Armchair  - 1 x daily - 7 x weekly - 2 sets - 10 reps - Long Sitting Quad Set  - 1 x daily - 7 x weekly - 2 sets - 10 reps - Supine Active Straight Leg Raise  - 1 x daily - 7 x weekly - 2 sets - 10 reps - Supine Bridge  - 1 x daily - 7 x weekly - 2 sets - 10 reps - Clamshell  - 1 x daily - 7 x weekly - 2 sets - 10 reps - Sidelying Hip Abduction  - 1 x daily - 7 x weekly - 2 sets - 10 reps - Standing Heel Raise  - 1 x daily - 7 x weekly - 2 sets - 20 reps - Standing March with Counter Support  - 1 x daily - 7 x weekly - 1-2 sets - 10 reps  ASSESSMENT:  CLINICAL IMPRESSION: Pt is doing well with PT.   He continues to be challenged by current level of activity.  He is challenged with stance on Lt LE.  Pt is making steady progress PT monitored throughout and provided verbal cues.  He required fewer verbal cues for farmer's carry with kettlebell for symmetry, trunk rotation and step length and did very well with addition of hip matrix.  Patient would benefit from skilled PT to progress towards goal related activities.   OBJECTIVE IMPAIRMENTS: Abnormal gait, decreased activity tolerance, decreased balance, difficulty walking, decreased strength, increased muscle spasms, impaired flexibility, and pain.   ACTIVITY LIMITATIONS: carrying, lifting, bending, standing, squatting, sleeping, stairs, transfers, and locomotion level  PARTICIPATION LIMITATIONS: meal prep, cleaning, driving, community activity, and occupation  PERSONAL FACTORS: Fitness,  Past/current experiences, and 3+ comorbidities: Hx of left tibial plateau Fx,  Psoriatic arthritis, Thrombocytopenia are also affecting patient's functional outcome.   REHAB POTENTIAL: Good  CLINICAL DECISION MAKING: Evolving/moderate complexity  EVALUATION COMPLEXITY: Moderate   GOALS: Goals reviewed with patient? Yes  SHORT TERM GOALS: Target date: 10/25/2024 Pt will be independent with initial HEP.  Baseline: 10/22/24 Goal status: MET  2.  Patient will participate in a 6 minute walk test to establish a baseline measurement. Baseline: 10/24/24 Goal status: MET  3.  Patient will report at least a 25% improvement in symptoms since starting PT. Baseline: 10/31/24 Goal status: MET   LONG TERM GOALS: Target date: 01/16/2025    Pt will be independent with advanced HEP to allow for self progression after discharge. Baseline: further advancement is needed (11/19/24) Goal status: In progress   2.  Ambulate all distances with symmetry without device due to increased strength.  Baseline: antalgia without cane with Trendelenburg with fatigue (11/21/24) Goal status: NEW  3.  Patient will improve Lower Extremity Functional Status to at least 62% to demonstrate improved functional mobility. Baseline: 44/80=55%  Goal status: In progress   4.  Patient will report ability to return to hiking without increased pain. Baseline: has not been able to do this (11/21/24) Goal status: In progress   5.  Patient to increase LE strength to [facility] to allow him to  negotiate stairs with reciprocal pattern. Baseline: step-to when initiating and then progresses to step-over-step Goal status: in progress   6. Ambulate > or = to 1500 feet with 6 min walk test to improve community ambulation  Baseline: 1392 without cane (age related norm is 2215 feet)  Goal status: NEW   PLAN:  PT FREQUENCY: 1-2x/week  PT DURATION: 8 weeks  PLANNED INTERVENTIONS: 97164- PT Re-evaluation, 97750- Physical Performance  Testing, 97110-Therapeutic exercises, 97530- Therapeutic activity, [id number]- Neuromuscular re-education, 97535- Self Care, 02859- Manual therapy, [id number]- Gait training, [phone]- Canalith repositioning, [id number]- Aquatic Therapy, H9716- Electrical stimulation (unattended), [id number]- Electrical stimulation (manual), [id number]- Vasopneumatic device, [id number]- Ultrasound, [id number]- Ionotophoresis 4mg /ml Dexamethasone , 79439 (1-2 muscles), 20561 (3+ muscles)- Dry Needling, Patient/Family education, Balance training, Stair training, Taping, Joint mobilization, Joint manipulation, Scar mobilization, Vestibular training, Cryotherapy, and Moist heat  PLAN FOR NEXT SESSION: Assess and progress HEP as indicated, strengthening, flexibility, gait training.   Burnard Joy, PT 12/06/24 8:49 AM   [facility] [address] [place], [place] [address] Phone # [phone] Fax [phone]  "

## 2024-12-06 ENCOUNTER — Other Ambulatory Visit: Payer: Self-pay

## 2024-12-09 ENCOUNTER — Other Ambulatory Visit: Payer: Self-pay

## 2024-12-18 ENCOUNTER — Other Ambulatory Visit: Payer: Self-pay

## 2024-12-19 ENCOUNTER — Other Ambulatory Visit (HOSPITAL_COMMUNITY): Payer: Self-pay

## 2024-12-19 ENCOUNTER — Other Ambulatory Visit: Payer: Self-pay | Admitting: Pharmacy Technician

## 2024-12-19 ENCOUNTER — Other Ambulatory Visit: Payer: Self-pay

## 2024-12-19 NOTE — Progress Notes (Signed)
 Specialty Pharmacy Refill Coordination Note  Ryan Lynch is a 44 y.o. male contacted today regarding refills of specialty medication(s) Secukinumab  (Cosentyx  UnoReady)   Patient requested Delivery   Delivery date: 01/07/25   Verified address: [address]  [place] [address]   Medication will be filled on: 01/06/25

## 2024-12-20 ENCOUNTER — Other Ambulatory Visit: Payer: Self-pay

## 2025-04-21 ENCOUNTER — Ambulatory Visit: Admitting: Internal Medicine
